# Patient Record
Sex: Male | Born: 1935 | Race: White | Hispanic: No | Marital: Married | Smoking: Former smoker
Health system: Southern US, Community
[De-identification: ages and names within clinical notes are randomized; demographics above are authoritative.]

## PROBLEM LIST (undated history)

## (undated) DIAGNOSIS — E669 Obesity, unspecified: Secondary | ICD-10-CM

## (undated) DIAGNOSIS — I2510 Atherosclerotic heart disease of native coronary artery without angina pectoris: Secondary | ICD-10-CM

## (undated) DIAGNOSIS — I219 Acute myocardial infarction, unspecified: Secondary | ICD-10-CM

## (undated) DIAGNOSIS — H9190 Unspecified hearing loss, unspecified ear: Secondary | ICD-10-CM

## (undated) DIAGNOSIS — I10 Essential (primary) hypertension: Secondary | ICD-10-CM

## (undated) DIAGNOSIS — I639 Cerebral infarction, unspecified: Secondary | ICD-10-CM

## (undated) DIAGNOSIS — K5120 Ulcerative (chronic) proctitis without complications: Secondary | ICD-10-CM

## (undated) DIAGNOSIS — E785 Hyperlipidemia, unspecified: Secondary | ICD-10-CM

## (undated) DIAGNOSIS — G250 Essential tremor: Secondary | ICD-10-CM

## (undated) DIAGNOSIS — I4819 Other persistent atrial fibrillation: Secondary | ICD-10-CM

## (undated) DIAGNOSIS — K219 Gastro-esophageal reflux disease without esophagitis: Secondary | ICD-10-CM

## (undated) DIAGNOSIS — R251 Tremor, unspecified: Secondary | ICD-10-CM

## (undated) DIAGNOSIS — K635 Polyp of colon: Secondary | ICD-10-CM

## (undated) DIAGNOSIS — N400 Enlarged prostate without lower urinary tract symptoms: Secondary | ICD-10-CM

## (undated) DIAGNOSIS — K5790 Diverticulosis of intestine, part unspecified, without perforation or abscess without bleeding: Secondary | ICD-10-CM

## (undated) DIAGNOSIS — K922 Gastrointestinal hemorrhage, unspecified: Secondary | ICD-10-CM

## (undated) DIAGNOSIS — F419 Anxiety disorder, unspecified: Secondary | ICD-10-CM

## (undated) DIAGNOSIS — R413 Other amnesia: Secondary | ICD-10-CM

## (undated) DIAGNOSIS — H269 Unspecified cataract: Secondary | ICD-10-CM

## (undated) DIAGNOSIS — G252 Other specified forms of tremor: Secondary | ICD-10-CM

## (undated) HISTORY — DX: Other persistent atrial fibrillation: I48.19

## (undated) HISTORY — DX: Benign prostatic hyperplasia without lower urinary tract symptoms: N40.0

## (undated) HISTORY — DX: Obesity, unspecified: E66.9

## (undated) HISTORY — DX: Anxiety disorder, unspecified: F41.9

## (undated) HISTORY — DX: Tremor, unspecified: R25.1

## (undated) HISTORY — PX: INGUINAL HERNIA REPAIR: SUR1180

## (undated) HISTORY — DX: Unspecified hearing loss, unspecified ear: H91.90

## (undated) HISTORY — DX: Hyperlipidemia, unspecified: E78.5

## (undated) HISTORY — DX: Acute myocardial infarction, unspecified: I21.9

## (undated) HISTORY — DX: Ulcerative (chronic) proctitis without complications: K51.20

## (undated) HISTORY — DX: Atherosclerotic heart disease of native coronary artery without angina pectoris: I25.10

## (undated) HISTORY — DX: Other specified forms of tremor: G25.2

## (undated) HISTORY — DX: Polyp of colon: K63.5

## (undated) HISTORY — DX: Essential tremor: G25.0

## (undated) HISTORY — PX: CORONARY ANGIOPLASTY WITH STENT PLACEMENT: SHX49

## (undated) HISTORY — DX: Essential (primary) hypertension: I10

## (undated) HISTORY — DX: Unspecified cataract: H26.9

## (undated) HISTORY — DX: Diverticulosis of intestine, part unspecified, without perforation or abscess without bleeding: K57.90

## (undated) HISTORY — DX: Other amnesia: R41.3

## (undated) HISTORY — PX: HAMMER TOE SURGERY: SHX385

## (undated) HISTORY — DX: Gastrointestinal hemorrhage, unspecified: K92.2

## (undated) HISTORY — DX: Gastro-esophageal reflux disease without esophagitis: K21.9

---

## 1999-02-04 ENCOUNTER — Other Ambulatory Visit: Admission: RE | Admit: 1999-02-04 | Discharge: 1999-02-04 | Payer: Self-pay | Admitting: Gastroenterology

## 2000-12-21 ENCOUNTER — Encounter (INDEPENDENT_AMBULATORY_CARE_PROVIDER_SITE_OTHER): Payer: Self-pay | Admitting: Gastroenterology

## 2000-12-22 ENCOUNTER — Other Ambulatory Visit: Admission: RE | Admit: 2000-12-22 | Discharge: 2000-12-22 | Payer: Self-pay | Admitting: Gastroenterology

## 2000-12-22 ENCOUNTER — Encounter (INDEPENDENT_AMBULATORY_CARE_PROVIDER_SITE_OTHER): Payer: Self-pay | Admitting: Gastroenterology

## 2000-12-22 ENCOUNTER — Encounter (INDEPENDENT_AMBULATORY_CARE_PROVIDER_SITE_OTHER): Payer: Self-pay | Admitting: Specialist

## 2003-01-25 ENCOUNTER — Encounter (INDEPENDENT_AMBULATORY_CARE_PROVIDER_SITE_OTHER): Payer: Self-pay | Admitting: Gastroenterology

## 2003-07-29 ENCOUNTER — Inpatient Hospital Stay (HOSPITAL_COMMUNITY): Admission: EM | Admit: 2003-07-29 | Discharge: 2003-08-16 | Payer: Self-pay | Admitting: Cardiology

## 2003-07-29 ENCOUNTER — Encounter: Payer: Self-pay | Admitting: Cardiology

## 2003-07-30 ENCOUNTER — Encounter: Payer: Self-pay | Admitting: Cardiology

## 2003-07-31 ENCOUNTER — Encounter: Payer: Self-pay | Admitting: Cardiology

## 2003-08-01 ENCOUNTER — Encounter: Payer: Self-pay | Admitting: Cardiology

## 2003-08-02 ENCOUNTER — Encounter: Payer: Self-pay | Admitting: Cardiology

## 2003-08-03 ENCOUNTER — Encounter: Payer: Self-pay | Admitting: Cardiology

## 2003-08-05 ENCOUNTER — Encounter: Payer: Self-pay | Admitting: Cardiology

## 2003-08-09 ENCOUNTER — Encounter: Payer: Self-pay | Admitting: Cardiology

## 2003-08-11 ENCOUNTER — Encounter: Payer: Self-pay | Admitting: Cardiology

## 2003-09-10 ENCOUNTER — Ambulatory Visit (HOSPITAL_COMMUNITY): Admission: RE | Admit: 2003-09-10 | Discharge: 2003-09-11 | Payer: Self-pay | Admitting: Cardiology

## 2003-12-20 ENCOUNTER — Inpatient Hospital Stay (HOSPITAL_COMMUNITY): Admission: EM | Admit: 2003-12-20 | Discharge: 2003-12-21 | Payer: Self-pay | Admitting: Emergency Medicine

## 2005-01-16 ENCOUNTER — Ambulatory Visit: Payer: Self-pay | Admitting: Gastroenterology

## 2005-01-31 ENCOUNTER — Ambulatory Visit: Payer: Self-pay | Admitting: Gastroenterology

## 2005-02-05 ENCOUNTER — Ambulatory Visit (HOSPITAL_COMMUNITY): Admission: RE | Admit: 2005-02-05 | Discharge: 2005-02-05 | Payer: Self-pay | Admitting: Gastroenterology

## 2005-02-05 ENCOUNTER — Encounter (INDEPENDENT_AMBULATORY_CARE_PROVIDER_SITE_OTHER): Payer: Self-pay | Admitting: Gastroenterology

## 2005-02-05 ENCOUNTER — Encounter (INDEPENDENT_AMBULATORY_CARE_PROVIDER_SITE_OTHER): Payer: Self-pay | Admitting: *Deleted

## 2005-04-09 ENCOUNTER — Ambulatory Visit: Payer: Self-pay | Admitting: Cardiology

## 2005-08-24 ENCOUNTER — Inpatient Hospital Stay (HOSPITAL_COMMUNITY): Admission: EM | Admit: 2005-08-24 | Discharge: 2005-08-29 | Payer: Self-pay | Admitting: Emergency Medicine

## 2005-08-25 ENCOUNTER — Ambulatory Visit: Payer: Self-pay | Admitting: *Deleted

## 2005-08-27 ENCOUNTER — Encounter: Payer: Self-pay | Admitting: Cardiology

## 2005-10-07 ENCOUNTER — Ambulatory Visit: Payer: Self-pay | Admitting: Cardiology

## 2005-11-04 ENCOUNTER — Ambulatory Visit: Payer: Self-pay | Admitting: Gastroenterology

## 2006-01-27 ENCOUNTER — Ambulatory Visit: Payer: Self-pay | Admitting: Cardiology

## 2006-07-21 ENCOUNTER — Ambulatory Visit: Payer: Self-pay | Admitting: Cardiology

## 2006-07-21 ENCOUNTER — Emergency Department (HOSPITAL_COMMUNITY): Admission: EM | Admit: 2006-07-21 | Discharge: 2006-07-21 | Payer: Self-pay | Admitting: *Deleted

## 2006-07-23 ENCOUNTER — Ambulatory Visit: Payer: Self-pay

## 2006-08-11 ENCOUNTER — Ambulatory Visit: Payer: Self-pay | Admitting: Cardiology

## 2007-01-26 ENCOUNTER — Ambulatory Visit: Payer: Self-pay | Admitting: Cardiology

## 2007-03-16 ENCOUNTER — Ambulatory Visit: Payer: Self-pay | Admitting: Gastroenterology

## 2007-04-19 ENCOUNTER — Ambulatory Visit: Payer: Self-pay | Admitting: Gastroenterology

## 2007-05-25 ENCOUNTER — Ambulatory Visit: Payer: Self-pay | Admitting: Cardiology

## 2007-07-06 ENCOUNTER — Ambulatory Visit: Payer: Self-pay | Admitting: Cardiology

## 2007-07-27 ENCOUNTER — Ambulatory Visit: Payer: Self-pay

## 2007-09-07 ENCOUNTER — Ambulatory Visit: Payer: Self-pay | Admitting: Cardiology

## 2007-12-28 ENCOUNTER — Ambulatory Visit: Payer: Self-pay | Admitting: Gastroenterology

## 2008-01-02 ENCOUNTER — Encounter: Admission: RE | Admit: 2008-01-02 | Discharge: 2008-01-02 | Payer: Self-pay | Admitting: Family Medicine

## 2008-03-09 ENCOUNTER — Ambulatory Visit (HOSPITAL_COMMUNITY): Admission: RE | Admit: 2008-03-09 | Discharge: 2008-03-09 | Payer: Self-pay | Admitting: Family Medicine

## 2008-03-14 ENCOUNTER — Ambulatory Visit: Payer: Self-pay | Admitting: Cardiology

## 2008-08-27 ENCOUNTER — Ambulatory Visit (HOSPITAL_COMMUNITY): Admission: RE | Admit: 2008-08-27 | Discharge: 2008-08-27 | Payer: Self-pay | Admitting: Family Medicine

## 2008-08-29 ENCOUNTER — Ambulatory Visit: Payer: Self-pay | Admitting: Cardiology

## 2008-09-11 ENCOUNTER — Telehealth: Payer: Self-pay | Admitting: Gastroenterology

## 2009-01-01 ENCOUNTER — Encounter: Payer: Self-pay | Admitting: Gastroenterology

## 2009-01-02 ENCOUNTER — Telehealth: Payer: Self-pay | Admitting: Gastroenterology

## 2009-01-02 ENCOUNTER — Ambulatory Visit: Payer: Self-pay | Admitting: Cardiology

## 2009-02-15 ENCOUNTER — Encounter (INDEPENDENT_AMBULATORY_CARE_PROVIDER_SITE_OTHER): Payer: Self-pay | Admitting: *Deleted

## 2009-03-12 ENCOUNTER — Ambulatory Visit: Payer: Self-pay | Admitting: Gastroenterology

## 2009-03-12 DIAGNOSIS — K5150 Left sided colitis without complications: Secondary | ICD-10-CM | POA: Insufficient documentation

## 2009-03-12 DIAGNOSIS — K5190 Ulcerative colitis, unspecified, without complications: Secondary | ICD-10-CM | POA: Insufficient documentation

## 2009-03-12 DIAGNOSIS — K219 Gastro-esophageal reflux disease without esophagitis: Secondary | ICD-10-CM

## 2009-03-20 ENCOUNTER — Ambulatory Visit: Payer: Self-pay | Admitting: Gastroenterology

## 2009-03-20 ENCOUNTER — Encounter: Payer: Self-pay | Admitting: Gastroenterology

## 2009-03-22 ENCOUNTER — Encounter: Payer: Self-pay | Admitting: Gastroenterology

## 2009-03-27 ENCOUNTER — Ambulatory Visit: Payer: Self-pay | Admitting: Cardiology

## 2009-05-21 ENCOUNTER — Telehealth: Payer: Self-pay | Admitting: Gastroenterology

## 2009-07-10 ENCOUNTER — Ambulatory Visit: Payer: Self-pay | Admitting: Cardiology

## 2009-10-30 ENCOUNTER — Ambulatory Visit: Payer: Self-pay | Admitting: Cardiology

## 2009-10-30 DIAGNOSIS — I4819 Other persistent atrial fibrillation: Secondary | ICD-10-CM

## 2009-10-30 DIAGNOSIS — I2510 Atherosclerotic heart disease of native coronary artery without angina pectoris: Secondary | ICD-10-CM | POA: Insufficient documentation

## 2010-02-12 ENCOUNTER — Ambulatory Visit: Payer: Self-pay | Admitting: Cardiology

## 2010-02-12 DIAGNOSIS — E785 Hyperlipidemia, unspecified: Secondary | ICD-10-CM

## 2010-02-28 ENCOUNTER — Telehealth (INDEPENDENT_AMBULATORY_CARE_PROVIDER_SITE_OTHER): Payer: Self-pay | Admitting: *Deleted

## 2010-03-25 ENCOUNTER — Encounter: Payer: Self-pay | Admitting: Cardiology

## 2010-05-28 ENCOUNTER — Ambulatory Visit: Payer: Self-pay | Admitting: Cardiology

## 2010-06-12 ENCOUNTER — Telehealth: Payer: Self-pay | Admitting: Gastroenterology

## 2010-07-30 ENCOUNTER — Ambulatory Visit: Payer: Self-pay | Admitting: Gastroenterology

## 2010-08-19 ENCOUNTER — Encounter: Payer: Self-pay | Admitting: Gastroenterology

## 2010-09-10 ENCOUNTER — Ambulatory Visit: Payer: Self-pay | Admitting: Cardiology

## 2010-12-10 ENCOUNTER — Ambulatory Visit: Admission: RE | Admit: 2010-12-10 | Discharge: 2010-12-10 | Payer: Self-pay | Source: Home / Self Care

## 2010-12-17 ENCOUNTER — Telehealth (INDEPENDENT_AMBULATORY_CARE_PROVIDER_SITE_OTHER): Payer: Self-pay | Admitting: *Deleted

## 2010-12-18 ENCOUNTER — Encounter (HOSPITAL_COMMUNITY)
Admission: RE | Admit: 2010-12-18 | Discharge: 2010-12-23 | Payer: Self-pay | Source: Home / Self Care | Attending: Cardiology | Admitting: Cardiology

## 2010-12-18 ENCOUNTER — Encounter: Payer: Self-pay | Admitting: *Deleted

## 2010-12-18 ENCOUNTER — Ambulatory Visit: Admission: RE | Admit: 2010-12-18 | Discharge: 2010-12-18 | Payer: Self-pay | Source: Home / Self Care

## 2010-12-22 ENCOUNTER — Encounter: Payer: Self-pay | Admitting: Cardiology

## 2010-12-23 NOTE — Assessment & Plan Note (Signed)
Summary: [facility]      Allergies Added:   Visit Type:  Follow-up Referring Provider:  na Primary Provider:  Rudi Heap, MD   CC:  Atrial Fibrillation and CAD.  History of Present Illness: The patient presents for followup of the above. He has done quite well since I last saw him. He doesn't notice any new palpitations and has had no presyncope or syncope. He says the chest discomfort, neck or arm discomfort. He has had no shortness of breath, PND orthopnea. He tries to remain active. Review his lipids and they were excellent. He has been started on diabetes medication.  Current Medications (verified): 1)  Fish Oil 1000 Mg Caps (Omega-3 Fatty Acids) .... One Tablet By Mouth Two Times A Day 2)  Metoprolol Tartrate 100 Mg Tabs (Metoprolol Tartrate) .Marland Kitchen.. 1 1/2 Tablets By Mouth Two Times A Day 3)  Omeprazole 20 Mg Cpdr (Omeprazole) .Marland Kitchen.. 1` By Mouth Daily 4)  Digoxin 0.125 Mg Tabs (Digoxin) .... One Tablet By Mouth Once Daily 5)  Klor-Con M20 20 Meq Cr-Tabs (Potassium Chloride Crys Cr) .... 1/2 By Mouth Daily 6)  Aspirin 81 Mg  Tabs (Aspirin) .... One Tablet By Mouth Once Daily 7)  Lipitor 40 Mg Tabs (Atorvastatin Calcium) .... One Tablet By Mouth Once Daily 8)  Paroxetine Hcl 20 Mg Tabs (Paroxetine Hcl) .Marland Kitchen.. 1 1/2 Tablets By Mouth Once Daily 9)  Niaspan 500 Mg Cr-Tabs (Niacin (Antihyperlipidemic)) .... One Tablet By Mouth At Bedtime 10)  Furosemide 40 Mg Tabs (Furosemide) .Marland Kitchen.. 1 By Mouth Once Daily As Needed For Fluid 11)  Warfarin Sodium 5 Mg Tabs (Warfarin Sodium) .Marland Kitchen.. 1 Tablet By Mouth Once Daily 12)  Nitrostat 0.4 Mg Subl (Nitroglycerin) .... As Needed For Chest Pain 13)  Apriso 0.375 Gm Xr24h-Cap (Mesalamine) .... 4 Tablets By Mouth Every Morning 14)  Calcium Citrate + D 315-200 Mg-Unit Tabs (Calcium Citrate-Vitamin D) .... 2 By Mouth Daily 15)  Vitamin D 1000 Unit Tabs (Cholecalciferol) .Marland Kitchen.. 1 By Mouth Daiy 16)  Rapaflo 8 Mg Caps (Silodosin) .... One Capsule By Mouth Once  Daily 17)  Glipizide-Metformin Hcl 2.5-500 Mg Tabs (Glipizide-Metformin Hcl) .... One Tablet By Mouth in The Morning  Allergies (verified): 1)  ! Sulfa 2)  ! Codeine  Past History:  Past Medical History: Reviewed history from 07/30/2010 and no changes required. Coronary Artery Disease(myocardial infarction with V  fib arrest in 2004).  Left main 25% stenosis, LAD 80% stenosis,   99% stenosis, 25% circumflex stenosis, 25% ramus intermedius stenosis, 50-  40% right coronary artery stenosis, 90% RV branch stenosis with right to  left collaterals.  His EF was 55% with mild anterior hypokinesis (He  underwent Taxus stenting at the time) Atrial Fibrillation Anxiety Disorder Hyperlipidemia BPH Left-sided Ulcerative Colitis Diverticulosis Hyperplastic colon polyps GERD Hemorrhoids Diabetes  Past Surgical History: Reviewed history from 07/30/2010 and no changes required. Bilateral inguinal hernia repair Hammertoe surgery Taxus Stenting   Review of Systems       As stated in the HPI and negative for all other systems.   Vital Signs:  Patient profile:   77 year old male Height:      71 inches Weight:      199 pounds BMI:     27.86 Pulse rate:   92 / minute Resp:     18 per minute BP sitting:   118 / 64  (right arm)  Vitals Entered By: Marrion Coy, CNA (September 10, 2010 9:42 AM)  Physical Exam  General:  Well developed, well nourished, no acute distress. Head:  Normocephalic and atraumatic. Neck:  Supple; no masses or thyromegaly. Chest Wall:  no deformities or breast masses noted Lungs:  rales R base.   Heart:  Irregular rate and rhythm; no murmurs, rubs,  or bruits. Abdomen:  Soft, nontender and nondistended. No masses, hepatosplenomegaly or hernias noted. Normal bowel sounds. Msk:  Back normal, normal gait. Muscle strength and tone normal. Skin:  Intact without lesions or rashes. Cervical Nodes:  no significant adenopathy Psych:  Alert and cooperative. Normal  mood and affect.   EKG  Procedure date:  09/10/2010  Findings:      atrial fibrillation, rate 92, axis within normal limits, intervals within normal limits, poor anterior R-wave progression  Impression & Recommendations:  Problem # 1:  FIBRILLATION, ATRIAL (ICD-427.31) Tthe patient tolerates rate control and anticoagulation. No change in therapy is indicated. Orders: EKG w/ Interpretation (93000)  Problem # 2:  CAD (ICD-414.00) He is having no new symptoms. We will continue with risk reduction. Orders: EKG w/ Interpretation (93000)  Problem # 3:  DYSLIPIDEMIA (ICD-272.4) He is on combination therapy and at target values.  Patient Instructions: 1)  Your physician recommends that you schedule a follow-up appointment in: 11/2010 in [place] 2)  Your physician recommends that you continue on your current medications as directed. Please refer to the Current Medication list given to you today.

## 2010-12-23 NOTE — Assessment & Plan Note (Signed)
Summary: YEARLY MED REFILL/YF   History of Present Illness Visit Type: Follow-up Visit Primary GI MD: Elie Goody MD [facility] Primary Provider: Rudi Heap, MD  Requesting Provider: na Chief Complaint: Yearly f/u for ulcerative colitis. Pt c/o of gas and denies any other GI complaints History of Present Illness:   77 year old male with ulcerative colitis and GERD. He has had no activity of his colitis. Last colonoscopy, 03/2009, was unremarkable, including biopsies. He has chronic GERD, which is well controlled on omeprazole. Chemistry panel performed via Dr. Kathi Der office in May 2011 with normal LFTs, BUN and creatinine.   GI Review of Systems      Denies abdominal pain, acid reflux, belching, bloating, chest pain, dysphagia with liquids, dysphagia with solids, heartburn, loss of appetite, nausea, vomiting, vomiting blood, weight loss, and  weight gain.        Denies anal fissure, black tarry stools, change in bowel habit, constipation, diarrhea, diverticulosis, fecal incontinence, heme positive stool, hemorrhoids, irritable bowel syndrome, jaundice, light color stool, liver problems, rectal bleeding, and  rectal pain. Current Medications (verified): 1)  Fish Oil 1000 Mg Caps (Omega-3 Fatty Acids) .... One Tablet By Mouth Two Times A Day 2)  Metoprolol Tartrate 100 Mg Tabs (Metoprolol Tartrate) .Marland Kitchen.. 1 1/2 Tablets By Mouth Two Times A Day 3)  Omeprazole 20 Mg Cpdr (Omeprazole) .Marland Kitchen.. 1` By Mouth Daily 4)  Digoxin 0.125 Mg Tabs (Digoxin) .... One Tablet By Mouth Once Daily 5)  Klor-Con M20 20 Meq Cr-Tabs (Potassium Chloride Crys Cr) .... 1/2 By Mouth Daily 6)  Aspirin 81 Mg  Tabs (Aspirin) .... One Tablet By Mouth Once Daily 7)  Lipitor 40 Mg Tabs (Atorvastatin Calcium) .... One Tablet By Mouth Once Daily 8)  Paroxetine Hcl 20 Mg Tabs (Paroxetine Hcl) .Marland Kitchen.. 1 1/2 Tablets By Mouth Once Daily 9)  Niaspan 500 Mg Cr-Tabs (Niacin (Antihyperlipidemic)) .... One Tablet By Mouth At Bedtime 10)   Furosemide 40 Mg Tabs (Furosemide) .Marland Kitchen.. 1 By Mouth Once Daily As Needed For Fluid 11)  Warfarin Sodium 5 Mg Tabs (Warfarin Sodium) .Marland Kitchen.. 1 Tablet By Mouth Once Daily 12)  Nitrostat 0.4 Mg Subl (Nitroglycerin) .... As Needed For Chest Pain 13)  Apriso 0.375 Gm Xr24h-Cap (Mesalamine) .... 4 Tablets By Mouth Every Morning 14)  Calcium Citrate + D 315-200 Mg-Unit Tabs (Calcium Citrate-Vitamin D) .... 2 By Mouth Daily 15)  Vitamin D 1000 Unit Tabs (Cholecalciferol) .Marland Kitchen.. 1 By Mouth Daiy 16)  Rapaflo 8 Mg Caps (Silodosin) .... One Capsule By Mouth Once Daily 17)  Glipizide-Metformin Hcl 2.5-500 Mg Tabs (Glipizide-Metformin Hcl) .... One Tablet By Mouth in The Morning  Allergies (verified): 1)  ! Sulfa 2)  ! Codeine  Past History:  Past Medical History: Coronary Artery Disease(myocardial infarction with V  fib arrest in 2004).  Left main 25% stenosis, LAD 80% stenosis,   99% stenosis, 25% circumflex stenosis, 25% ramus intermedius stenosis, 50-  40% right coronary artery stenosis, 90% RV branch stenosis with right to  left collaterals.  His EF was 55% with mild anterior hypokinesis (He  underwent Taxus stenting at the time) Atrial Fibrillation Anxiety Disorder Hyperlipidemia BPH Left-sided Ulcerative Colitis Diverticulosis Hyperplastic colon polyps GERD Hemorrhoids Diabetes  Past Surgical History: Bilateral inguinal hernia repair Hammertoe surgery Taxus Stenting   Family History: Reviewed history from 03/12/2009 and no changes required. No FH of Colon Cancer: Family History of Diabetes: ? mother  Family History of Heart Disease: runs in family  Social History: Reviewed history from 03/12/2009 and  no changes required. Patient is a former smoker.  Years ago Alcohol Use - no Illicit Drug Use - no Occupation: retired  Review of Systems       The patient complains of allergy/sinus, cough, hearing problems, sore throat, and urination changes/pain.         The pertinent  positives and negatives are noted as above and in the HPI. All other ROS were reviewed and were negative.   Vital Signs:  Patient profile:   77 year old male Height:      71 inches Weight:      195 pounds BMI:     27.30 BSA:     2.09 Pulse rate:   88 / minute Pulse rhythm:   regular BP sitting:   118 / 68  (left arm) Cuff size:   regular  Vitals Entered By: Ok Anis CMA (July 30, 2010 2:22 PM)  Physical Exam  General:  Well developed, well nourished, no acute distress. Head:  Normocephalic and atraumatic. Eyes:  PERRLA, no icterus. Ears:  decreased auditory acuity.   Mouth:  No deformity or lesions, dentition normal. Lungs:  rales R base.   Heart:  Regular rate and rhythm; no murmurs, rubs,  or bruits. Abdomen:  Soft, nontender and nondistended. No masses, hepatosplenomegaly or hernias noted. Normal bowel sounds. Psych:  Alert and cooperative. Normal mood and affect.  Impression & Recommendations:  Problem # 1:  ULCERATIVE COLITIS-LEFT SIDE (ICD-556.5) Continue Apriso at current dosage. Next surveillance colonoscopy recommended May 2012.  Problem # 2:  GERD (ICD-530.81) Continue omeprazole 20 mg q.a.m. and antireflux measures.  Problem # 3:  COUMADIN THERAPY (ICD-V58.61)  Problem # 4:  FIBRILLATION, ATRIAL (ICD-427.31)  Patient Instructions: 1)  Pick up your prescription from your pharmacy.  2)  Please continue current medications.  3)  Please schedule a follow-up appointment in 1 year. 4)  Copy sent to : Rudi Heap, MD 5)  The medication list was reviewed and reconciled.  All changed / newly prescribed medications were explained.  A complete medication list was provided to the patient / caregiver.  Prescriptions: APRISO 0.375 GM XR24H-CAP (MESALAMINE) 4 tablets by mouth every morning  #120 x 11   Entered by:   Christie Nottingham CMA (AAMA)   Authorized by:   Meryl Dare MD [facility]   Signed by:   Meryl Dare MD [facility] on 07/30/2010   Method used:    [facility] to        [facility] [phone]* (retail)       [address]       [place], [place]  [address]       Ph: [phone] or [phone]       Fax: [phone]   RxID:   [id number]

## 2010-12-23 NOTE — Assessment & Plan Note (Signed)
Summary: [name]      Allergies Added:   Visit Type:  Follow-up Referring Provider:  n/a Primary Provider:  Riki Sheer  CC:  Atrial Fibrillation.  History of Present Illness: The patient presents for routine followup. Since I last saw him he has had no new complaints. He continues to exercise. He denies any palpitations, presyncope or syncope. He has had no shortness of breath, PND or orthopnea. He denies any chest pressure, neck or arm discomfort.  Current Medications (verified): 1)  Fish Oil 1000 Mg Caps (Omega-3 Fatty Acids) .... Once Daily 2)  Metoprolol Tartrate 100 Mg Tabs (Metoprolol Tartrate) .Marland Kitchen.. 1 1/2 Tablets By Mouth Two Times A Day 3)  Omeprazole 20 Mg Cpdr (Omeprazole) .Marland Kitchen.. 1` By Mouth Daily 4)  Digoxin 0.125 Mg Tabs (Digoxin) .... One Tablet By Mouth Once Daily 5)  Klor-Con M20 20 Meq Cr-Tabs (Potassium Chloride Crys Cr) .... 1/2 By Mouth Daily 6)  Aspirin 81 Mg  Tabs (Aspirin) .... One Tablet By Mouth Once Daily 7)  Lipitor 80 Mg Tabs (Atorvastatin Calcium) .... 1/2 By Mouth Daily 8)  Paroxetine Hcl 20 Mg Tabs (Paroxetine Hcl) .Marland Kitchen.. 1 1/2 Tablets By Mouth Once Daily 9)  Niaspan 500 Mg Cr-Tabs (Niacin (Antihyperlipidemic)) .... One Tablet By Mouth At Bedtime 10)  Furosemide 40 Mg Tabs (Furosemide) .Marland Kitchen.. 1 By Mouth Once Daily 11)  Warfarin Sodium 5 Mg Tabs (Warfarin Sodium) .Marland Kitchen.. 1 Tablet By Mouth Once Daily 12)  Nitrostat 0.4 Mg Subl (Nitroglycerin) .... As Needed For Chest Pain 13)  Apriso 0.375 Gm Xr24h-Cap (Mesalamine) .... 4 Tablets By Mouth Every Morning 14)  Calcium Citrate + D 315-200 Mg-Unit Tabs (Calcium Citrate-Vitamin D) .... 2 By Mouth Daily 15)  Vitamin D 1000 Unit Tabs (Cholecalciferol) .Marland Kitchen.. 1 By Mouth Daiy 16)  Raperflow 8mg  .... 1 By Mouth Daily  Allergies (verified): 1)  ! Sulfa 2)  ! Codeine  Past History:  Past Medical History: Reviewed history from 10/30/2009 and no changes required. Coronary Artery Disease(myocardial infarction  with V  fib arrest in 2004).  Left main 25% stenosis, LAD 80% stenosis,   99% stenosis, 25% circumflex stenosis, 25% ramus intermedius stenosis, 50-  40% right coronary artery stenosis, 90% RV branch stenosis with right to  left collaterals.  His EF was 55% with mild anterior hypokinesis (He  underwent Taxus stenting at the time) Atrial Fibrillation Anxiety Disorder Hyperlipidemia BPH Left-sided ulcerative Colitis Diverticulosis Hyperplastic colon polyps GERD Hemorrhoids  Past Surgical History: Reviewed history from 03/08/2009 and no changes required. Bilateral inguinal hernia repair Hammertoe surgery  Review of Systems       As stated in the HPI and negative for all other systems.   Vital Signs:  Patient profile:   77 year old male Height:      71 inches Weight:      197 pounds BMI:     27.58 Pulse rate:   92 / minute Resp:     18 per minute BP sitting:   108 / 68  (right arm)  Vitals Entered By: Marrion Coy, CNA (May 28, 2010 11:37 AM)  Physical Exam  General:  Well developed, well nourished, in no acute distress. Head:  normocephalic and atraumatic Neck:  Supple; no masses or thyromegaly. Chest Wall:  no deformities or breast masses noted Lungs:  Clear bilaterally to auscultation and percussion. Abdomen:  Bowel sounds positive; abdomen soft and non-tender without masses, organomegaly, or hernias noted. No hepatosplenomegaly. Msk:  Back normal, normal gait. Muscle  strength and tone normal. Extremities:  No clubbing or cyanosis. Neurologic:  Alert and oriented x 3. Skin:  Intact without lesions or rashes. Psych:  Normal affect.   Detailed Cardiovascular Exam  Neck    Carotids: Carotids full and equal bilaterally without bruits.      Neck Veins: Normal, no JVD.    Heart    Inspection: no deformities or lifts noted.      Palpation: normal PMI with no thrills palpable.      Auscultation: irregular rate and rhythm, S1, S2 without murmurs, rubs, gallops, or  clicks.    Vascular    Abdominal Aorta: no palpable masses, pulsations, or audible bruits.      Femoral Pulses: normal femoral pulses bilaterally.      Pedal Pulses: normal pedal pulses bilaterally.      Radial Pulses: normal radial pulses bilaterally.      Peripheral Circulation: no clubbing, cyanosis, or edema noted with normal capillary refill.     EKG  Procedure date:  05/28/2010  Findings:      Atrial fibrillation, rate 88, axis within normal limits, intervals within normal limits, no acute ST-T wave changes, poor anterior R-wave progression, old anteroseptal infarct  Impression & Recommendations:  Problem # 1:  FIBRILLATION, ATRIAL (ICD-427.31) He is tolerating rate control and anticoagulation. No change in therapy is indicated. Orders: EKG w/ Interpretation (93000)  Problem # 2:  CAD (ICD-414.00) He continues to be asymptomatic exercising routinely. He will continue with aggressive risk reduction.  Problem # 3:  DYSLIPIDEMIA (ICD-272.4) Per Dr. Christell Constant with a goal LDL less than 100 and HDL greater than 40.  Patient Instructions: 1)  Your physician recommends that you schedule a follow-up appointment in: 10/11 with Dr Antoine Poche in [place] 2)  Your physician recommends that you continue on your current medications as directed. Please refer to the Current Medication list given to you today. 3)  You have been diagnosed with atrial fibrillation.  Atrial fibrillation is a condition in which one of the upper chambers of the heart has extra electrical cells causing it to beat very fast.  Please see the handout/brochure given to you today for further information.

## 2010-12-23 NOTE — Assessment & Plan Note (Signed)
Summary: [facility]  Medications Added * RAPERFLOW 8MG  1 by mouth daily      Allergies Added: a ball or a  Visit Type:  Follow-up Referring Provider:  n/a Primary Provider:  Riki Sheer  CC:  Atrial Fibrillation.  History of Present Illness: The patient will see to fibrillation and coronary disease. Since I last saw him he has done quite well. He exercises 4 times per week. He is having no chest pain or shortness of breath. He is having no palpitations, presyncope or orthopnea. He denies any weight gain or swelling. He tolerates the meds as listed.  Current Medications (verified): 1)  Fish Oil 1000 Mg Caps (Omega-3 Fatty Acids) .... Once Daily 2)  Metoprolol Tartrate 100 Mg Tabs (Metoprolol Tartrate) .Marland Kitchen.. 1 1/2 Tablets By Mouth Two Times A Day 3)  Omeprazole 20 Mg Cpdr (Omeprazole) .Marland Kitchen.. 1` By Mouth Daily 4)  Digoxin 0.125 Mg Tabs (Digoxin) .... One Tablet By Mouth Once Daily 5)  Klor-Con M20 20 Meq Cr-Tabs (Potassium Chloride Crys Cr) .... 1/2 By Mouth Daily 6)  Aspirin 81 Mg  Tabs (Aspirin) .... One Tablet By Mouth Once Daily 7)  Lipitor 80 Mg Tabs (Atorvastatin Calcium) .... 1/2 By Mouth Daily 8)  Paroxetine Hcl 20 Mg Tabs (Paroxetine Hcl) .Marland Kitchen.. 1 1/2 Tablets By Mouth Once Daily 9)  Niaspan 500 Mg Cr-Tabs (Niacin (Antihyperlipidemic)) .... One Tablet By Mouth At Bedtime 10)  Furosemide 40 Mg Tabs (Furosemide) .Marland Kitchen.. 1 By Mouth Once Daily 11)  Warfarin Sodium 5 Mg Tabs (Warfarin Sodium) .Marland Kitchen.. 1 Tablet By Mouth Once Daily 12)  Nitrostat 0.4 Mg Subl (Nitroglycerin) .... As Needed For Chest Pain 13)  Apriso 0.375 Gm Xr24h-Cap (Mesalamine) .... 4 Tablets By Mouth Every Morning 14)  Calcium Citrate + D 315-200 Mg-Unit Tabs (Calcium Citrate-Vitamin D) .... 2 By Mouth Daily 15)  Vitamin D 1000 Unit Tabs (Cholecalciferol) .Marland Kitchen.. 1 By Mouth Daiy 16)  Raperflow 8mg  .... 1 By Mouth Daily  Allergies (verified): 1)  ! Sulfa 2)  ! Codeine  Past History:  Past Medical  History: Reviewed history from 10/30/2009 and no changes required. Coronary Artery Disease(myocardial infarction with V  fib arrest in 2004).  Left main 25% stenosis, LAD 80% stenosis,   99% stenosis, 25% circumflex stenosis, 25% ramus intermedius stenosis, 50-  40% right coronary artery stenosis, 90% RV branch stenosis with right to  left collaterals.  His EF was 55% with mild anterior hypokinesis (He  underwent Taxus stenting at the time) Atrial Fibrillation Anxiety Disorder Hyperlipidemia BPH Left-sided ulcerative Colitis Diverticulosis Hyperplastic colon polyps GERD Hemorrhoids  Past Surgical History: Reviewed history from 03/08/2009 and no changes required. Bilateral inguinal hernia repair Hammertoe surgery  Review of Systems       As stated in the HPI and negative for all other systems.   Vital Signs:  Patient profile:   77 year old male Height:      71 inches Weight:      200 pounds BMI:     28.00 Pulse rate:   96 / minute Resp:     16 per minute BP sitting:   110 / 70  (right arm)  Vitals Entered By: Marrion Coy, CNA (February 12, 2010 9:30 AM)  Physical Exam  General:  Well developed, well nourished, in no acute distress. Head:  normocephalic and atraumatic Eyes:  PERRLA, no icterus. Mouth:  No deformity or lesions, dentition normal. Neck:  Supple; no masses or thyromegaly. Chest Wall:  no deformities or breast  masses noted Lungs:  Clear bilaterally to auscultation and percussion. Heart:  Iregular rate and rhythm; no murmurs, rubs,  or bruits. Abdomen:  Bowel sounds positive; abdomen soft and non-tender without masses, organomegaly, or hernias noted. No hepatosplenomegaly. Msk:  Back normal, normal gait. Muscle strength and tone normal. Pulses:  normal pedal pulses bilaterally.   Extremities:  No clubbing or cyanosis. Neurologic:  Alert and oriented x 3. Skin:  Intact without lesions or rashes. Cervical Nodes:  no significant adenopathy Axillary Nodes:   no significant adenopathy Inguinal Nodes:  no significant adenopathy Psych:  Normal affect.   Impression & Recommendations:  Problem # 1:  FIBRILLATION, ATRIAL (ICD-427.31) No further cardiovascular testing is suggested. He tolerates anticoagulation and rate control.  Orders: EKG w/ Interpretation (93000)  Problem # 2:  CAD (ICD-414.00) He has no new symptoms. No further testing is suggested. He will continue with risk reduction. Orders: EKG w/ Interpretation (93000)  Problem # 3:  DYSLIPIDEMIA (ICD-272.4) I reviewed his lipid profile done by Dr. Christell Constant in January. It is excellent and he will continue the meds as listed.  Patient Instructions: 1)  Your physician recommends that you schedule a follow-up appointment in: 3 months in [place] with Dr Antoine Poche 04/2010 2)  Your physician recommends that you continue on your current medications as directed. Please refer to the Current Medication list given to you today. 3)  You have been diagnosed with atrial fibrillation.  Atrial fibrillation is a condition in which one of the upper chambers of the heart has extra electrical cells causing it to beat very fast.  Please see the handout/brochure given to you today for further information.

## 2010-12-23 NOTE — Progress Notes (Signed)
Summary: Apriso refill  Medications Added APRISO 0.375 GM XR24H-CAP (MESALAMINE) 4 tablets by mouth every morning       Phone Note Call from Patient   Caller: Spouse Call For: Dr Russella Dar Summary of Call: Scheduled next available appt for 08-06-10. Is all out of Apriso and would rally like a refill to hold him over until appt please. Initial call taken by: Leanor Kail [facility],  June 12, 2010 11:16 AM  Follow-up for Phone Call        Rx was sent to pts pharmacy and pt notified to keep appt for any further refills.  Follow-up by: Christie Nottingham CMA [facility]),  June 12, 2010 11:24 AM    New/Updated Medications: APRISO 0.375 GM XR24H-CAP (MESALAMINE) 4 tablets by mouth every morning Prescriptions: APRISO 0.375 GM XR24H-CAP (MESALAMINE) 4 tablets by mouth every morning  #120 x 1   Entered by:   Christie Nottingham CMA (AAMA)   Authorized by:   Meryl Dare MD [facility]   Signed by:   Christie Nottingham CMA [facility]) on 06/12/2010   Method used:   [facility]        [facility] [phone]* (retail)       [address]       [place], [place]  [address]       Ph: [phone] or [phone]       Fax: [phone]   RxID:   [id number]

## 2010-12-23 NOTE — Progress Notes (Signed)
   Faxed LOV over to [facility] to fax [phone] [facility]  February 28, 2010 1:05 PM

## 2010-12-25 NOTE — Assessment & Plan Note (Addendum)
Summary: Cardiology Nuclear Testing  Nuclear Med Background Indications for Stress Test: Evaluation for Ischemia, Stent Patency   History: Cardioversion, Heart Catheterization, Myocardial Infarction, Myocardial Perfusion Study, Stents  History Comments: '04 OOH MI>Stents-RCA/LAD; '07 PIR:JJOAC  antero-apical scar, no ischemia, EF=57%; h/o afib, CHF  Symptoms: DOE, SOB    Nuclear Pre-Procedure Cardiac Risk Factors: Family History - CAD, Hypertension, Lipids, NIDDM Caffeine/Decaff Intake: None NPO After: 8:00 PM Lungs: clear IV 0.9% NS with Angio Cath: 22g     IV Site: R Hand IV Started by: Bonnita Levan, RN Chest Size (in) 48     Height (in): 71 Weight (lb): 200 BMI: 28.00  Nuclear Med Study 1 or 2 day study:  1 day     Stress Test Type:  Treadmill/Lexiscan Reading MD:  Cassell Clement, MD     Referring MD:  J.Hochrein Resting Radionuclide:  Technetium 57m Tetrofosmin     Resting Radionuclide Dose:  11.0 mCi  Stress Radionuclide:  Technetium 31m Tetrofosmin     Stress Radionuclide Dose:  33.0 mCi   Stress Protocol  Max Systolic BP: 130 mm Hg Lexiscan: 0.4 mg   Stress Test Technologist:  Milana Na, EMT-P     Nuclear Technologist:  Harlow Asa, CNMT  Rest Procedure  Myocardial perfusion imaging was performed at rest 45 minutes following the intravenous administration of Technetium 10m Tetrofosmin.  Stress Procedure  The patient received IV Lexiscan 0.4 mg over 15-seconds with concurrent low level exercise and then Technetium 57m Tetrofosmin was injected at 30-seconds while the patient continued walking one more minute.  There were no significant changes with Lexiscan.  Quantitative spect images were obtained after a 45 minute delay.  QPS Raw Data Images:  Normal; no motion artifact; normal heart/lung ratio. Stress Images:  There is decreased uptake in the apical wall. Rest Images:  There is decreased uptake in the apical wall. Subtraction (SDS):  There is a  fixed defect that is most consistent with a previous infarction. Transient Ischemic Dilatation:  1.04  (Normal <1.22)  Lung/Heart Ratio:  0.27  (Normal <0.45)  Quantitative Gated Spect Images QGS EDV:  105 ml QGS ESV:  55 ml QGS EF:  48 %  Findings Low risk nuclear study  Evidence for anterior (septal apical) infarct  Evidence for LV Dysfunction LV Dysfunction    Overall Impression  Exercise Capacity: Lexiscan with low level exercise. BP Response: Normal blood pressure response. Clinical Symptoms: No chest pain ECG Impression: No significant ST segment change suggestive of ischemia. Overall Impression: Low risk stress nuclear study. 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  EF 48% with apical hypokinesis.  Appended Document: Cardiology Nuclear Testing No significant abnormalities.  Continue current therapy.  Appended Document: Cardiology Nuclear Testing left of results on voicemail

## 2010-12-25 NOTE — Progress Notes (Signed)
Summary: Nuclear pre procedure  Phone Note Outgoing Call Call back at [facility] Phone [phone]   Call placed by: Rea College, CMA,  December 17, 2010 2:40 PM Call placed to: Patient Summary of Call: Reviewed information on Myoview Information Sheet (see scanned document for further details).  Spoke with patient.      Nuclear Med Background Indications for Stress Test: Evaluation for Ischemia, Stent Patency   History: Cardioversion, Heart Catheterization, Myocardial Infarction, Myocardial Perfusion Study, Stents  History Comments: '04 OOH MI>Stents-RCA/LAD; '07 GEX:BMWUX  antero-apical scar, no ischemia, EF=57%; h/o afib, CHF     Nuclear Pre-Procedure Cardiac Risk Factors: Family History - CAD, Hypertension, Lipids, NIDDM Height (in): 71

## 2010-12-25 NOTE — Assessment & Plan Note (Signed)
Summary: [facility]      Allergies Added:   Visit Type:  Follow-up Referring Provider:  na Primary Provider:  Rudi Heap, MD   CC:  CAD.  History of Present Illness: The patient presents for followup of his known coronary disease. Since I last saw him he has had no new cardiovascular complaints.  He does his routine exercises and denies any chest pain. He has had no new SOB, PND or orthopnea.  He has had no new palpiations, presyncope or syncope.  He denies any weight gain or edema.  I reivewed lipids drawn on the 12th of this month and they were excellent.  Current Medications (verified): 1)  Fish Oil 1000 Mg Caps (Omega-3 Fatty Acids) .... One Tablet By Mouth Two Times A Day 2)  Metoprolol Tartrate 100 Mg Tabs (Metoprolol Tartrate) .Marland Kitchen.. 1 1/2 Tablets By Mouth Two Times A Day 3)  Omeprazole 20 Mg Cpdr (Omeprazole) .Marland Kitchen.. 1` By Mouth Daily 4)  Digoxin 0.125 Mg Tabs (Digoxin) .... One Tablet By Mouth Once Daily 5)  Klor-Con M20 20 Meq Cr-Tabs (Potassium Chloride Crys Cr) .... 1/2 By Mouth Daily 6)  Aspirin 81 Mg  Tabs (Aspirin) .... One Tablet By Mouth Once Daily 7)  Lipitor 40 Mg Tabs (Atorvastatin Calcium) .... One Tablet By Mouth Once Daily 8)  Paroxetine Hcl 20 Mg Tabs (Paroxetine Hcl) .Marland Kitchen.. 1 1/2 Tablets By Mouth Once Daily 9)  Niaspan 500 Mg Cr-Tabs (Niacin (Antihyperlipidemic)) .... One Tablet By Mouth At Bedtime 10)  Furosemide 40 Mg Tabs (Furosemide) .Marland Kitchen.. 1 By Mouth Once Daily As Needed For Fluid 11)  Warfarin Sodium 5 Mg Tabs (Warfarin Sodium) .Marland Kitchen.. 1 Tablet By Mouth Once Daily 12)  Nitrostat 0.4 Mg Subl (Nitroglycerin) .... As Needed For Chest Pain 13)  Apriso 0.375 Gm Xr24h-Cap (Mesalamine) .... 4 Tablets By Mouth Every Morning 14)  Calcium Citrate + D 315-200 Mg-Unit Tabs (Calcium Citrate-Vitamin D) .... 2 By Mouth Daily 15)  Vitamin D 1000 Unit Tabs (Cholecalciferol) .Marland Kitchen.. 1 By Mouth Daiy 16)  Rapaflo 8 Mg Caps (Silodosin) .... One Capsule By Mouth Once Daily 17)   Glipizide-Metformin Hcl 2.5-500 Mg Tabs (Glipizide-Metformin Hcl) .... One Tablet By Mouth in The Morning  Allergies (verified): 1)  ! Sulfa 2)  ! Codeine  Past History:  Past Medical History: Reviewed history from 07/30/2010 and no changes required. Coronary Artery Disease(myocardial infarction with V  fib arrest in 2004).  Left main 25% stenosis, LAD 80% stenosis,   99% stenosis, 25% circumflex stenosis, 25% ramus intermedius stenosis, 50-  40% right coronary artery stenosis, 90% RV branch stenosis with right to  left collaterals.  His EF was 55% with mild anterior hypokinesis (He  underwent Taxus stenting at the time) Atrial Fibrillation Anxiety Disorder Hyperlipidemia BPH Left-sided Ulcerative Colitis Diverticulosis Hyperplastic colon polyps GERD Hemorrhoids Diabetes  Past Surgical History: Reviewed history from 07/30/2010 and no changes required. Bilateral inguinal hernia repair Hammertoe surgery Taxus Stenting   Review of Systems       As stated in the HPI and negative for all other systems.   Vital Signs:  Patient profile:   77 year old male Height:      71 inches Weight:      202 pounds BMI:     28.28 Pulse rate:   92 / minute Resp:     16 per minute BP sitting:   118 / 72  (right arm)  Vitals Entered By: Marrion Coy, CNA (December 10, 2010 11:57 AM)  Physical Exam  General:  Well developed, well nourished, no acute distress. Head:  Normocephalic and atraumatic. Eyes:  PERRLA, no icterus. Mouth:  No deformity or lesions, dentition normal. Neck:  Supple; no masses or thyromegaly. Chest Wall:  no deformities or breast masses noted Lungs:  rales R base.   Heart:  Irregular rate and rhythm; no murmurs, rubs,  or bruits. Abdomen:  Soft, nontender and nondistended. No masses, hepatosplenomegaly or hernias noted. Normal bowel sounds. Msk:  Back normal, normal gait. Muscle strength and tone normal. Extremities:  No clubbing or cyanosis. Neurologic:   Alert and oriented x 3. Skin:  Intact without lesions or rashes. Cervical Nodes:  no significant adenopathy Inguinal Nodes:  no significant adenopathy Psych:  Alert and cooperative. Normal mood and affect.   Impression & Recommendations:  Problem # 1:  CAD (ICD-414.00) It has been 7 years since his last stress test.  Therefore stress perfusion testing is indicated.  He will otherwise continue with current management.  Problem # 2:  DYSLIPIDEMIA (ICD-272.4) He has an excellent lipid profile and he will continue the meds as listed.  Problem # 3:  FIBRILLATION, ATRIAL (ICD-427.31) He tolerates rate control and anticoagulation. Orders: Nuclear Stress Test (Nuc Stress Test)  Patient Instructions: 1)  Your physician recommends that you schedule a follow-up appointment in: 3 months with Dr Antoine Poche  in [place] 2)  Your physician recommends that you continue on your current medications as directed. Please refer to the Current Medication list given to you today. 3)  Your physician has requested that you have an adenosine myoview.  For further information please visit [email or web address]  Please follow instruction sheet, as given.

## 2011-02-11 DIAGNOSIS — I491 Atrial premature depolarization: Secondary | ICD-10-CM | POA: Insufficient documentation

## 2011-02-11 DIAGNOSIS — F410 Panic disorder [episodic paroxysmal anxiety] without agoraphobia: Secondary | ICD-10-CM | POA: Insufficient documentation

## 2011-02-11 DIAGNOSIS — N400 Enlarged prostate without lower urinary tract symptoms: Secondary | ICD-10-CM | POA: Insufficient documentation

## 2011-03-25 ENCOUNTER — Encounter: Payer: Self-pay | Admitting: Cardiology

## 2011-03-25 ENCOUNTER — Ambulatory Visit (INDEPENDENT_AMBULATORY_CARE_PROVIDER_SITE_OTHER): Payer: Medicare Other | Admitting: Cardiology

## 2011-03-25 DIAGNOSIS — I4891 Unspecified atrial fibrillation: Secondary | ICD-10-CM

## 2011-03-25 DIAGNOSIS — I2510 Atherosclerotic heart disease of native coronary artery without angina pectoris: Secondary | ICD-10-CM

## 2011-03-25 DIAGNOSIS — E785 Hyperlipidemia, unspecified: Secondary | ICD-10-CM

## 2011-03-25 DIAGNOSIS — I480 Paroxysmal atrial fibrillation: Secondary | ICD-10-CM

## 2011-03-25 NOTE — Assessment & Plan Note (Addendum)
He has an excellent lipid profile and I will continue with the therapy listed.

## 2011-03-25 NOTE — Assessment & Plan Note (Signed)
He tolerates this rhythm with rate control and anticoagulation. No change in therapy is indicated.

## 2011-03-25 NOTE — Patient Instructions (Signed)
Continue current medications Follow up in 3 months with Dr Antoine Poche in [place]

## 2011-03-25 NOTE — Progress Notes (Signed)
HPI The patient returns for 4 month followup. Since I last saw him he had a stress perfusion imaging demonstrating an old anteroseptal infarct with an EF of 40%. He continues to do well clinically. He has had no chest discomfort, neck or arm discomfort. He has had no palpitations, presyncope or syncope. He is in atrial fibrillation but does not seem to notice this. He has had no weight gain or edema. He denies any new shortness of breath, PND or orthopnea.  Allergies  Allergen Reactions  . Asa-Apap-Salicyl-Caff     Takes warfarin  . Codeine   . Nsaids Other (See Comments)    On warfarin  . Sulfonamide Derivatives     Current Outpatient Prescriptions  Medication Sig Dispense Refill  . atorvastatin (LIPITOR) 40 MG tablet Take 40 mg by mouth at bedtime.        . calcium citrate-vitamin D (CITRACAL+D) 315-200 MG-UNIT per tablet Take 1 tablet by mouth.        . desogestrel-ethinyl estradiol (APRI) 0.15-30 MG-MCG per tablet Take 1 tablet by mouth. 4 tabs in am       . digoxin (LANOXIN) 0.125 MG tablet Take 125 mcg by mouth daily.        . fish oil-omega-3 fatty acids 1000 MG capsule Take 2 g by mouth daily.        . furosemide (LASIX) 40 MG tablet Take 40 mg by mouth daily. If needed for fld       . glipiZIDE-metformin (METAGLIP) 2.5-500 MG per tablet Take 1 tablet by mouth daily.        . metoprolol (LOPRESSOR) 100 MG tablet Take 100 mg by mouth. One and one half qd       . niacin (NIASPAN) 500 MG CR tablet Take 500 mg by mouth at bedtime.        . nitroGLYCERIN (NITROSTAT) 0.3 MG SL tablet Place 0.3 mg under the tongue every 5 (five) minutes as needed. Never had to use       . omeprazole (PRILOSEC) 20 MG capsule Take 20 mg by mouth daily.        Marland Kitchen PARoxetine (PAXIL) 20 MG tablet Take 20 mg by mouth. One tab to one half  qd       . potassium chloride SA (K-DUR,KLOR-CON) 20 MEQ tablet 20 mEq. One half qd      . warfarin (COUMADIN) 5 MG tablet Take 5 mg by mouth daily.          Past Medical  History  Diagnosis Date  . CAD (coronary artery disease)     Anterior MI and DES stenting 2004, Vfib arrest  . PAF (paroxysmal atrial fibrillation)   . Dyslipidemia   . Anxiety   . GIB (gastrointestinal bleeding)   . Obesity   . UC (ulcerative colitis confined to rectum)     Past Surgical History  Procedure Date  . Hammer toe surgery   . Inguinal hernia repair     ROS: As stated in the HPI and negative for all other systems.  PHYSICAL EXAM BP 119/70  Pulse 91  Ht 5\' 11"  (1.803 m)  Wt 202 lb (91.627 kg)  BMI 28.17 kg/m2 GENERAL:  Well appearing HEENT:  Pupils equal round and reactive, fundi not visualized, oral mucosa unremarkable NECK:  No jugular venous distention, waveform within normal limits, carotid upstroke brisk and symmetric, no bruits, no thyromegaly LYMPHATICS:  No cervical, inguinal adenopathy LUNGS:  Clear to auscultation bilaterally BACK:  No CVA  tenderness CHEST:  Unremarkable HEART:  PMI not displaced or sustained,S1 and S2 within normal limits, no S3, no clicks, no rubs, no murmurs, irregular ABD:  Flat, positive bowel sounds normal in frequency in pitch, no bruits, no rebound, no guarding, no midline pulsatile mass, no hepatomegaly, no splenomegaly EXT:  2 plus pulses throughout, no edema, no cyanosis no clubbing SKIN:  No rashes no nodules NEURO:  Cranial nerves II through XII grossly intact, motor grossly intact throughout PSYCH:  Cognitively intact, oriented to person place and time  EKG:  Atrial fibrillation, no acute ST T wave changes.  ASSESSMENT AND PLAN

## 2011-03-25 NOTE — Assessment & Plan Note (Signed)
He had a stress test earlier this year with no active ischemia. No change in therapy is indicated.

## 2011-04-07 NOTE — Assessment & Plan Note (Signed)
[facility]                            CARDIOLOGY OFFICE NOTE   Marquavion, Venhuizen DOSS CYBULSKI                        MRN:          [id number]  DATE:01/02/2009                            DOB:          [birth year]    REASON FOR PRESENTATION:  Evaluate the patient with atrial fibrillation  and coronary artery disease.   HISTORY OF PRESENT ILLNESS:  The patient returns for 61-month followup.  He has had no new problems since I last saw him.  He is now 77 years  old.  He is getting along well, exercising 5 days a week, walking on a  treadmill.  With this level of activity, he denies any chest discomfort,  neck or arm discomfort.  He has had no shortness of breath, PND, or  orthopnea.  He has had no new palpitations, presyncope, or syncope.   PAST MEDICAL HISTORY:  1. Coronary artery disease (see the August 02, 2006, note for      details.  Well-preserved ejection fraction with an EF of 55% with      mild anterior hypokinesis).  2. Paroxysmal atrial fibrillation.  3. Dyslipidemia.  4. Anxiety.  5. Benign prostatic hypertrophy.  6. Ulcerative colitis.  7. Colonic polyps.  8. Previous GI bleed.  9. Bilateral inguinal hernia repair and Hammer toe surgery.   ALLERGIES:  SULFA and CODEINE.   MEDICATIONS:  1. Metoprolol 150 mg b.i.d.  2. Fish oil.  3. Calcium.  4. Furosemide 40 mg daily.  5. Pantoprazole 40 mg daily.  6. Digoxin 0.125 mcg daily.  7. Klor-Con 20 mEq daily.  8. Aspirin 81 mg daily.  9. Lipitor 80 mg at bedtime.  10.Coumadin 5 mg as directed.  11.Paroxetine 30 mg daily.  12.Niaspan 500 mg at bedtime.  13.Vitamin D.  14.Asacol 1200 mg t.i.d.   REVIEW OF SYSTEMS:  As stated in the HPI, and otherwise negative for  other systems.   PHYSICAL EXAMINATION:  GENERAL:  The patient is in no distress.  VITAL SIGNS:  Blood pressure 122/70, heart rate 95 and regular, weight  214 pounds, and body mass index 29.  HEENT:  Eyelids unremarkable; pupils  equal, round, and reactive to  light, fundi not visualized.  NECK:  No jugular venous distention at 45 degrees; carotid upstroke  brisk and symmetric; no bruits, no thyromegaly.  LYMPHATICS:  No adenopathy.  LUNGS:  Clear to auscultation bilaterally.  BACK:  No costovertebral angle tenderness.  CHEST:  Unremarkable.  HEART:  PMI not displaced or sustained; S1 and S2 within normal limits;  no S3, no S4; no clicks, no rubs, no murmurs.  ABDOMEN:  Obese; positive bowel sounds; normal in frequency and pitch;  no bruits, no rebound, no guarding; no midline pulsatile mass; no  organomegaly.  SKIN:  No rashes.  No nodules.  EXTREMITIES:  Pulses 2+, no edema.   ASSESSMENT AND PLAN:  1. Atrial fibrillation.  The patient's rate is reasonable.  I have put      a monitor on him before.  He has a slight tachybrady, but overall  the rate is controlled.  He will continue on the meds as listed.      He tolerates Coumadin.  2. Coronary artery disease.  He is having no symptoms.  No further      cardiovascular testing is suggested.  3. Dyslipidemia.  I reviewed his lipid profile and he has an excellent      ratio.  His LDL is 62 and his HDL is 39.  He will continue on the      meds as listed.  4. Hypertension.  His blood pressure is controlled.  He will continue      the meds as listed.  5. Followup.  He would like to be seen every 3-4 months and I will      arrange this.     [name], MD, [facility]  Electronically Signed    JH/MedQ  DD: 01/02/2009  DT: 01/02/2009  Job #: [id number]   cc:   [name], M.D.

## 2011-04-07 NOTE — Assessment & Plan Note (Signed)
[name]                         GASTROENTEROLOGY OFFICE NOTE   [name], [name]                          MRN:          [id number]  DATE:12/28/2007                            DOB:          05/24/1930    [name] is a 76 year old white male, previously followed by Dr. Victorino [name] with a history of left-sided ulcerative colitis, diagnosed  initially in the early 1990s.  In addition, he has had a history of  hyperplastic colon polyps.  His last colonoscopy was performed in May of  2008, and showed mild left-sided colitis, hemorrhoids and  diverticulosis.  He states he has done very well with only a rare  episode of small amounts of rectal bleeding, which may be hemorrhoidal.  He has no abdominal pain, diarrhea, change in bowel habits, fevers,  chills or rashes.  He has chronic GERD and his reflux symptoms are well-  controlled on pantoprazole.  He relates no dysphagia or odynophagia.  Surveillance colonoscopies with biopsies have not shown dysplasia.   PAST MEDICAL HISTORY:  Coronary artery disease  paroxysmal atrial fibrillation  chronic Coumadin therapy  Left-sided ulcerative colitis  diverticulosis  hemorrhoids  GERD  Dyslipidemia  Anxiety  Benign prostatic hypertrophy  Hyperplastic colon polyps  Status post bilateral inguinal hernia repairs  Status post hammertoe surgery.   CURRENT MEDICATIONS:  Listed on the chart, updated and reviewed.   MEDICATION ALLERGIES:  CODEINE and SULFA DRUGS.   PHYSICAL EXAM:  Well-developed, well-nourished, white male, who is  present with his wife.  Weight 213.8 pounds, blood pressure is 118/66, pulse 88 and regular.  CHEST:  Clear to auscultation bilaterally.  CARDIAC:  Regular rate and rhythm without murmurs appreciated.  ABDOMEN:  Soft, nontender, nondistended.  Normoactive bowel sounds.  No  palpable organomegaly, masses or hernias.  NEUROLOGIC:  Alert and oriented times three.  Some memory  deficits.  Otherwise grossly nonfocal.   ASSESSMENT AND PLAN:  1. Longstanding left-sided ulcerative colitis.  Renew Asacol at 1.6 g      t.i.d.  When his symptoms are under very good control, he may      reduce the dosage to 1.2 g t.i.d and increase to 1.6g TID for      flares of bloody diarrhea.  Plan for surveillance colonoscopy in      May of 2010.  2. Diverticulosis.  Long-term high-fiber diet.  3. Internal and external hemorrhoids.  Expectant management.  Over-the-      counter hemorrhoidal creams and suppositories as needed for minor      rectal bleeding.  4. GERD.  Maintain pantoprazole 40 mg p.o. q.a.m.  Plan for an upper      endoscopy at the time of his next colonoscopy for Barrett's      screening.     [name]. [name], [name], [facility]  Electronically Signed    MTS/MedQ  DD: 12/28/2007  DT: 12/28/2007  Job #: [id number]   cc:   [name], M.D.

## 2011-04-07 NOTE — Assessment & Plan Note (Signed)
[facility]                            CARDIOLOGY OFFICE NOTE   [name], [name]                        MRN:          [id number]  DATE:07/10/2009                            DOB:          1930-04-24    PRIMARY CARE PHYSICIAN:  [name], MD   REASON FOR PRESENTATION:  Evaluate the patient with atrial fibrillation  and coronary disease.   HISTORY OF PRESENT ILLNESS:  The patient presents for followup of the  above.  Since I last saw him, he has been doing well.  He has had no new  cardiovascular complaints.  He did break some ribs falling out of bed.  He has been limited in his activities because of this.  However, the  pain is slowly resolving.  He has not had palpitations, presyncope, or  syncope.  He has otherwise not had any chest discomfort or shortness of  breath.   PAST MEDICAL HISTORY:  1. Coronary artery disease (see the August 02, 2006 note for      details).  His last perfusion study in 2007 demonstrated an EF 55%      with mild anterior hypokinesis).  2. Atrial fibrillation.  3. Dyslipidemia.  4. Anxiety.  5. Benign prostatic pressure.  6. Ulcerative colitis.  7. Chronic polyps.  8. Previous GI bleeding.  9. Bilateral inguinal hernia repair.  10.Hammertoe surgery.   ALLERGIES:  SULFA and CODEINE.   MEDICATIONS:  1. Metoprolol 150 mg b.i.d.  2. Fish oil.  3. Calcium.  4. Pantoprazole 40 mg daily.  5. Digoxin 125 mcg daily.  6. Klor-Con 20 mg daily.  7. Aspirin 81 mg daily.  8. Lipitor 80 mg daily.  9. Coumadin.  10.Paroxetine.  11.Niaspan 500 mg at bedtime.  12.Vitamin D.  13.Apriso.   REVIEW OF SYSTEMS:  As stated in the HPI and otherwise negative for  other systems.   PHYSICAL EXAMINATION:  GENERAL:  The patient is pleasant in no distress.  VITAL SIGNS:  Blood pressure 108/64, heart rate 89 and irregular, weight  205 pounds, body mass index 27.  NECK:  No jugular venous distention at 45 degrees.  Carotid  upstroke  brisk and symmetric.  No bruits.  LUNGS:  Clear to auscultation bilaterally.  HEART:  PMI not displaced or sustained, S1 and S2 within normal limits,  no S3, no S4, no clicks, no rubs, no murmurs.  ABDOMEN:  Mildly obese, positive bowel sounds normal in frequency and  pitch, no bruits, no rebound, no guarding, no midline pulsatile mass, no  organomegaly.  SKIN:  No rashes, no nodules.  EXTREMITIES:  Pulses 2+, no edema.   ASSESSMENT AND PLAN:  1. Atrial fibrillation.  The patient has good rate control.  He      tolerates anticoagulation.  No further cardiovascular testing is      suggested.  2. Coronary disease, having no new symptoms.  He had his last stress      test in 2007.  He will continue with risk reduction per Dr. Christell Constant.  3. Followup.  The patient insists on q.3  months followup, and I will      oblige him of this.     [name], MD, [facility]  Electronically Signed    JH/MedQ  DD: 07/10/2009  DT: 07/10/2009  Job #: [id number]   cc:   [name], M.D.

## 2011-04-07 NOTE — Assessment & Plan Note (Signed)
[facility] HEALTHCARE                            CARDIOLOGY OFFICE NOTE   [name], [name]                        MRN:          [id number]  DATE:03/27/2009                            DOB:          Mar 29, 1930    PRIMARY CARE PHYSICIAN:  [name], MD   REASON FOR PRESENTATION:  Evaluate the patient with atrial fibrillation  and coronary artery disease.   HISTORY OF PRESENT ILLNESS:  The patient presents with 53-month followup.  He insists on this.  He is having no new cardiovascular problems.  He  denies any palpitations, presyncope, or syncope.  He has had no chest  discomfort or shortness of breath.  He is exercising with physical  therapy in this building several days a week.   PAST MEDICAL HISTORY:  1. Coronary artery disease (see the August 02, 2006, note for      details.  Well-preserved ejection fraction with an EF of 55%.  Mild      anterior hypokinesis).  2. Atrial fibrillation.  3. Dyslipidemia.  4. Anxiety.  5. Benign prostatic hypertrophy.  6. Ulcerative colitis.  7. Chronic polyps.  8. Previous GI bleed.  9. Bilateral inguinal hernia repair.  10.Hammertoe surgery.   ALLERGIES:  SULFA and CODEINE.   MEDICATIONS:  1. Metoprolol 150 mg b.i.d.  2. Fish oil.  3. Calcium.  4. Furosemide 40 mg daily.  5. Pantoprazole 40 mg daily.  6. Digoxin 0.125 mg daily.  7. Potassium 20 mEq daily.  8. Aspirin 81 mg daily.  9. Lipitor 80 mg daily.  10.Coumadin as directed.  11.Paroxetine 30 mg daily.  12.Niaspan 500 mg nightly.  13.Vitamin D.  14.Asacol.   REVIEW OF SYSTEMS:  As stated in the HPI; otherwise, negative for all  other systems.   PHYSICAL EXAMINATION:  GENERAL:  The patient is in no distress.  VITAL SIGNS:  Blood pressure 110/78, heart rate 80 and irregular, weight  210 pounds, and body mass index 228.  HEENT:  Eyelids are unremarkable.  Pupils equal, round, and reactive to  light.  Fundi not visualized.  NECK:  No  jugular venous distention at 45 degrees.  Carotid upstroke  brisk and symmetric.  No bruits.  No thyromegaly.  LUNGS:  Clear to auscultation bilaterally.  HEART:  PMI not displaced or sustained.  S1 and S2 within normal limits.  No S3.  No clicks, no rubs, no murmurs.  ABDOMEN:  Obese, positive bowel sounds, normal in frequency and pitch.  No bruits, no rebound, no guarding.  No midline pulsatile mass.  No  organomegaly.  SKIN:  No rashes, no nodules.  EXTREMITIES:  Pulses 2+.  No edema.   ASSESSMENT AND PLAN:  1. Atrial fibrillation.  The patient is reasonably rate controlled.      He tolerates Coumadin.  No further cardiovascular testing is      suggested.  2. Coronary artery disease.  He is having no new symptoms.  No further      cardiovascular testing is suggested.  3. Dyslipidemia.  He has had a good lipid profile.  He  will continue      per Dr. Christell Constant.  4. Hypertension.  Blood pressure is controlled and he will continue      the meds as listed.  5. Obesity.  He understands the need to lose weight with diet and      exercise.  6. Followup.  He insists on 29-month followup and I will facilitate      this.     [name], MD, [facility]  Electronically Signed    JH/MedQ  DD: 03/27/2009  DT: 03/28/2009  Job #: [id number]   cc:   [name], M.D.

## 2011-04-07 NOTE — Assessment & Plan Note (Signed)
[facility]ALTHCARE                            CARDIOLOGY OFFICE NOTE   [name], [name]                        MRN:          [id number]  DATE:07/06/2007                            DOB:          1930/04/07    PRIMARY:  Alfredia Client, M.D.   REASON FOR PRESENTATION:  Evaluate the patient for atrial fibrillation.   HISTORY OF PRESENT ILLNESS:  The patient presented with dyspnea at the  last visit in early July.  He has had paroxysmal atrial fibrillation but  was in persistent atrial fibrillation with a rapid ventricular rate.  Increased his beta blocker at that point.  He also has been started on  digoxin since then.  He says he feels much better.  He has not been  having any further shortness of breath.  He denies any PND or orthopnea.  He is not noticing any palpitations.  He has had no pre-syncope or  syncope.  He denies any chest discomfort.   PAST MEDICAL HISTORY:  Coronary artery disease (see the August 11, 2006 note for details).  Well-preserved ejection fraction (EF 55% with  mild anterior hypokinesis).  Paroxysmal atrial fibrillation on chronic  Coumadin therapy.  Dyslipidemia.  Anxiety.  Benign prostatic  hypertrophy.  Ulcerative colitis.  Colonic polyps.  Previous  gastrointestinal bleeding.  Bilateral inguinal hernia repair.  Hammer-  toe surgery.   ALLERGIES:  SULFA, CODEINE.   MEDICATIONS:  1. Metoprolol 150 mg b.i.d.  2. Digoxin 0.125 mg daily.  3. Potassium 20 mEq daily.  4. Asacol.  5. Coumadin per [facility].  6. Niaspan 500 mg daily.  7. Protonix 40 mg daily.  8. Aspirin 81 mg daily.  9. Paxil 30 mg daily.  10.Lipitor 40 mg daily.   REVIEW OF SYSTEMS:  As stated in the HPI and otherwise negative for  other systems.   PHYSICAL EXAMINATION:  The patient is in no distress.  Blood pressure 128/88, heart rate 95 and irregular, weight 214 pounds.  HEENT:  Eyelids unremarkable.  Pupils are equal, round, and reactive to  light and accommodation.  Fundi not visualized.  Oral mucosa  unremarkable.  NECK:  No jugular venous distension at 45 degrees, carotid upstroke  brisk and symmetric, no bruits, thyromegaly.  LYMPHATICS:  No cervical, axillary, or inguinal adenopathy.  LUNGS:  Clear to auscultation bilaterally.  BACK:  No costovertebral angle tenderness.  CHEST:  Unremarkable.  HEART:  PMI not displaced or sustained, S1 and S2 within normal limits,  no S3, no S4, no clicks, rubs, murmurs.  ABDOMEN:  Obese, positive bowel sounds, normal in frequency and pitch,  no bruits, rebound, guarding.  No midline pulsatile masses or  organomegaly.  SKIN:  No rashes, no nodules.  EXTREMITIES:  With 2+ pulses throughout, no edema.   EKG:  Atrial fibrillation, no acute ST-T wave changes.   ASSESSMENT AND PLAN:  1. Atrial fibrillation.  The patient is not noticing his atrial      fibrillation now that the rate has slowed.  He is no longer      dyspneic.  At  this point, I want to apply a 48 hour monitor to see      if he has rate control, and also to see if he might not be      converting back and forth as he has been paroxysmal in the past.      At this point, since he is tolerating Coumadin and he is not having      any symptoms, I do not know that there is an advantage to      cardioversion, though will consider this.  He wants to defer the      monitor until his wife is through with an upcoming surgery.  2. Coronary artery disease.  He is not having any chest pain.  No      further cardiovascular testing is suggested.  He will continue with      secondary risk reduction.  3. Dyslipidemia per Dr. Morrie Sheldon.  4. Followup.  Will see the patient back in about 3 months or sooner if      needed.     [name], MD, [facility]  Electronically Signed    JH/MedQ  DD: 07/06/2007  DT: 07/07/2007  Job #: [id number]   cc:   Alfredia Client, MD

## 2011-04-07 NOTE — Assessment & Plan Note (Signed)
[facility] HEALTHCARE                            CARDIOLOGY OFFICE NOTE   Verbon, Giangregorio TERRENCE WISHON                        MRN:          [id number]  DATE:05/25/2007                            DOB:          05-Aug-1930    PRIMARY:  Dr. Vernon Prey.   REASON FOR PRESENTATION:  Evaluate patient with shortness of breath and  atrial fibrillation.   HISTORY OF PRESENT ILLNESS:  The patient was in his usual state of  health until about a week ago.  He noticed increasing shortness of  breath.  He went to [employer], was noted to be in atrial  fibrillation with a rapid rate of 124.  He was given digoxin 0.125 mg.  He also started taking Lasix 40 mg which he took for a few days.  He  then changed to 20 mg.  He says his breathing is now improved.  He has  not been having any chest discomfort.  He does not really notice the  palpitations.  He has had no presyncope or syncope.  He is not having  any chest pressure, neck discomfort, arm discomfort.  He has had no  increased swelling.   PAST MEDICAL HISTORY:  1. Coronary artery disease (see the August 11, 2006 note for      details).  2. Well preserved ejection fraction (EF 55% with mild anterior      hypokinesis).  3. Paroxysmal atrial fibrillation on chronic Coumadin therapy.  4. Dyslipidemia.  5. Anxiety.  6. Benign prostatic hypertrophy.  7. Ulcerative colitis.  8. Colonic polyps.  9. Previous gastrointestinal bleeding.  10.Benign inguinal hernia repair.  11.Hammertoe surgery.   ALLERGIES:  SULFA AND CODEINE.   MEDICATIONS:  1. Lipitor 40 mg daily.  2. Paxil 30 mg daily.  3. Aspirin 81 mg daily.  4. Protonix 40 mg daily.  5. Niaspan 500 mg daily.  6. Senokot.  7. Coumadin.  8. Metoprolol 100 mg b.i.d.  9. Asacol.  10.Potassium 20 mEq daily.  11.Digoxin 0.125 mg daily.  12.Lasix 20 mg daily.   REVIEW OF SYSTEMS:  As stated in the HPI and otherwise negative for  other systems.   PHYSICAL  EXAMINATION:  Patient is in no acute distress.  Blood pressure  112/72, heart rate 100 and irregular, weight 217 pounds.  HEENT:  Eyelids unremarkable, pupils equally round and reactive to  light, fundi not visualized, oral mucosa unremarkable.  NECK:  No jugular venous distention at 45 degrees, carotid upstroke  brisk and symmetrical, no bruits, no thyromegaly.  LYMPHATICS:  No cervical, axillary, inguinal adenopathy.  LUNGS:  Clear to auscultation without wheezing, crackles, or dullness to  percussion.  BACK:  No costovertebral angle tenderness.  CHEST:  Unremarkable.  HEART:  PMI not displaced or sustained, S1 and S2 within normal limits,  no S3, no murmurs.  ABDOMEN:  Obese, positive bowel sounds normal in frequency and pitch, no  bruits, no rebound, no guarding, no midline pulsatile mass, no  hepatomegaly, splenomegaly.  SKIN:  No rashes, no nodules.  EXTREMITIES:  With 2+ pulses throughout, no edema, no  cyanosis, no  clubbing.  NEURO:  Oriented to person, place, and time; cranial nerves II-XII  grossly intact, motor grossly intact.   EKG atrial fibrillation with a rapid rate (this was done on June 30), no  acute ST-T wave changes.   ASSESSMENT/PLAN:  1. Atrial fibrillation.  The patient is not noticing this other than      the dyspnea.  This has been treated with a diuretic.  I am going to      try to slow this rate down a little bit more.  I am going to have      him take his metoprolol 150 mg twice a day.  He is on the Coumadin      already.  He has had paroxysms of this, may convert on his own.  He      will let me know if he has any lightheadedness, increasing dyspnea,      or palpitations.  2. Coronary disease, having no ongoing chest pain.  No further      cardiovascular testing is suggested.  He will continue with      secondary risk reduction.  3. Dyslipidemia.  This is followed by Dr. Christell Constant.  4. Obesity.  We did have a discussion about the need to lose weight       with diet and exercise.  5. Followup.  I will see him again in a month.  I have given his      written instruction to get a BMET next week, along with an EKG.     [name], MD, [facility]  Electronically Signed    JH/MedQ  DD: 05/25/2007  DT: 05/26/2007  Job #: [id number]   cc:   [name], M.D.

## 2011-04-07 NOTE — Assessment & Plan Note (Signed)
[facility] HEALTHCARE                            CARDIOLOGY OFFICE NOTE   [name], [name]                        MRN:          [id number]  DATE:03/14/2008                            DOB:          08-02-1930    PRIMARY CARE PHYSICIAN:  Dr. Vernon Prey.   REASON FOR PRESENTATION:  A patient with coronary disease and atrial  fibrillation.   HISTORY OF PRESENT ILLNESS:  The patient presents for followup of the  above.  Since I last saw him, he has had pneumonia.  He is slowly  recovering from this.  He was exercising daily prior to this.  He has  not done that in a couple of weeks.  He is now over the pneumonia and  says that his breathing is okay.  He is not having any chest discomfort,  neck or arm discomfort.  He is not noticing any palpitations and has had  no presyncope or syncope.  He is actually in atrial fibrillation  currently and does not notice this rhythm.   PAST MEDICAL HISTORY:  Coronary artery disease (see the September 2007  note for the details), well-preserved ejection fraction (EF 55% with  mild anterior hypokinesis), paroxysmal atrial fibrillation,  dyslipidemia, anxiety, benign prostatic hypertrophy, ulcerative colitis,  colonic polyps, previous GI bleeding, bilateral inguinal hernia repair,  hammertoe surgery.   ALLERGIES:  SULFA AND CODEINE.   MEDICATIONS:  Metoprolol 150 mg b.i.d., fish oil, calcium, furosemide 40  mg daily, Asacol, pantoprazole, digoxin 125 mcg daily, Klor-Con 20 mEq  daily, aspirin 81 mg daily, Lipitor 80 daily, Coumadin per [employer] [facility]  in the a.m., paroxetine 30 mg daily, Niaspan 500 mg daily, vitamin D.   REVIEW OF SYSTEMS:  As stated in the HPI and otherwise negative for  other systems.   PHYSICAL EXAMINATION:  The patient is in no distress.  Blood pressure  118/60, heart rate 103 and irregular, weight 215 pounds, body mass index  29.  NECK:  No jugular venous distention at 45 degrees.  Carotid upstrokes  brisk and symmetrical.  No bruits, no thyromegaly.  LYMPHATICS:  No adenopathy.  LUNGS:  Clear to auscultation bilaterally.  BACK:  No costovertebral angle tenderness.  HEART:  PMI not displaced or sustained.  S1 and S2 within normal limits.  No S3.  No murmurs.  ABDOMEN:  Obese.  Positive bowel sounds.  Normal in frequency and pitch.  No bruits, rebound, guarding or midline pulsatile mass.  No  organomegaly.  SKIN:  No rashes.  No nodules.  EXTREMITIES:  2+ pulses throughout.  No edema.   ASSESSMENT/PLAN:  1. Coronary disease.  Patient is having no symptoms related to this.      No further cardiovascular testing is suggested.  He will continue      with secondary risk reduction.  2. Atrial fibrillation.  The patient tolerates this and has reasonable      rate control.  He is on chronic anticoagulation.  No further      intervention nor testing is warranted.  3. Dyslipidemia.  Per Dr. Christell Constant, the goal would  be an LDL of less than      100 and HDL greater than 40.  4. Followup.  Will see the patient again in 6 months or sooner if      needed.     [name], MD, [facility]  Electronically Signed    JH/MedQ  DD: 03/14/2008  DT: 03/14/2008  Job #: [id number]   cc:   [name], M.D.

## 2011-04-07 NOTE — Assessment & Plan Note (Signed)
[facility] HEALTHCARE                            CARDIOLOGY OFFICE NOTE   Gregory, [name]                        MRN:          [id number]  DATE:08/29/2008                            DOB:          08/22/1930    PRIMARY CARE PHYSICIAN:  [name], MD.   REASON FOR PRESENTATION:  Evaluate the patient with coronary artery  disease and atrial fibrillation.   HISTORY OF PRESENT ILLNESS:  The patient returns for 65-month followup.  Since I last saw him, he has done well.  He is exercising 4 days a week.  He states he is not having any problems with this.  He is not having any  chest discomfort, neck, or arm discomfort.  He does occasionally feel  that his heart is going fast after he exercises.  Otherwise, he does not  feel palpitations.  He has had no presyncope or syncope.  He has had no  PND or orthopnea.   PAST MEDICAL HISTORY:  Coronary artery disease (see the August 02, 2006, note for details, well-preserved ejection fraction EF 55% with  mild anterior hypokinesis), paroxysmal atrial fibrillation,  dyslipidemia, anxiety, benign prostatic hypertrophy, ulcerative colitis,  colonic polyps, previous GI bleed, bilateral inguinal hernia repair, and  hammertoe surgery.   ALLERGIES:  SULFA and CODEINE.   MEDICATIONS:  1. Metoprolol 150 mg b.i.d.  2. Fish oil.  3. Calcium.  4. Pantoprazole 40 mg daily.  5. Digoxin 125 mcg daily.  6. Klor-Con 20 mEq daily.  7. Aspirin 81 mg daily.  8. Lipitor 80 mg daily.  9. Coumadin.  10.Paroxetine 30 mg daily.  11.Niaspan 500 mg nightly.  12.Vitamin D.  13.Asacol 1200 mg t.i.d.   REVIEW OF SYSTEMS:  As stated in the HPI and otherwise negative for  other systems.   PHYSICAL EXAMINATION:  GENERAL:  The patient is in no distress.  VITAL SIGNS:  Blood pressure 104/72, heart 96 and regular, weight 216  pounds, and body mass index is 29.  NECK:  No jugular venous distention at 45 degrees, carotid upstroke  brisk and symmetrical, no bruits, no thyromegaly.  LYMPHATICS:  No cervical, axillary, or inguinal adenopathy.  LUNGS:  Clear to auscultation bilaterally.  BACK:  No costovertebral angle tenderness.  CHEST:  Unremarkable.  HEART:  PMI not displaced or sustained, S1 and S2 within normal limits,  no S3, no murmurs.  ABDOMEN:  Obese, positive bowel sounds.  Normal in frequency and pitch,  no bruits, no rebound, no guarding or midline pulsatile mass.  No  organomegaly.  SKIN:  No rashes, no nodules.  EXTREMITIES:  Pulses are 2+, trace edema.  NEURO:  Grossly intact.   ASSESSMENT AND PLAN:  1. Atrial fibrillation.  The patient's rate is slightly elevated      today.  He wore a monitor few years back and he had good rate      control.  He is going to let me know if he has any increasing      palpitations, at which point, I will have him re-wear a monitor.  For now, he will continue on the Coumadin in the meds as listed.  2. Coronary artery disease.  He is having no symptoms.  He had a      stress perfusion study in 2007.  No further cardiovascular testing      is suggested.  He will continue with risk reduction.  3. Dyslipidemia per Dr. Christell Constant.  The goals are an LDL less 100 and HDL      greater than 40.     [name], MD, [facility]  Electronically Signed    JH/MedQ  DD: 08/29/2008  DT: 08/29/2008  Job #: [id number]   cc:   [name], M.D.

## 2011-04-07 NOTE — Assessment & Plan Note (Signed)
[facility] HEALTHCARE                            CARDIOLOGY OFFICE NOTE   [name], [name] [name]                        MRN:          [id number]  DATE:09/07/2007                            DOB:          01-24-1930    PRIMARY CARE PHYSICIAN:  Dr. Vernon Prey.   REASON FOR PRESENTATION:  Evaluate patient with atrial fibrillation.   HISTORY OF PRESENT ILLNESS:  Patient returns for followup.  After the  last visit he did wear a Holter monitor that demonstrated that his  atrial fibrillation had a well-controlled rate with occasional pauses  with heightened vagal tone at night.  He is not having any  lightheadedness, presyncope, or syncope.  He is occasionally noticing  his palpitations.  I think this is paroxysmal atrial fibrillation.  He  is not having any chest discomfort, neck or arm discomfort.  He is not  having any new shortness of breath.   PAST MEDICAL HISTORY:  1. Coronary artery disease (see the August 11, 2006 note for      details).  2. Well-preserved ejection fraction (EF 55% with mild anterior      hypokinesis).  3. Paroxysmal atrial fibrillation on chronic Coumadin therapy.  4. Dyslipidemia.  5. Anxiety.  6. Benign prostatic hypertrophy.  7. Ulcerative colitis.  8. Colon polyps.  9. Previous gastrointestinal bleeding.  10.Bilateral inguinal hernia repair.  11.Hammertoe surgery.   ALLERGIES:  1. SULFA.  2. CODEINE.   MEDICATIONS:  1. Fish oil.  2. Metoprolol 150 mg b.i.d.  3. Digoxin 125 mcg daily.  4. Potassium 20 mEq daily.  5. Asacol.  6. Metoprolol 100 mg b.i.d..  7. Coumadin.  8. Senior vitamin.  9. Niaspan 500 mg daily.  10.Protonix 40 mg daily.  11.Coated aspirin 81 mg daily.  12.Paxil 30 mg daily.  13.Lipitor 40 mg daily.   REVIEW OF SYSTEMS:  As stated in the HPI, and otherwise negative for  other systems.   PHYSICAL EXAMINATION:  Patient is in no distress.  Blood pressure 108/74.  Heart rate 56 and regular.  Weight 216  pounds.  HEENT:  Eyes unremarkable.  Pupils equal, round, and reactive to light.  Fundi not visualized.  NECK:  No jugular venous distention.  Wave form within normal limits.  Carotid upstroke brisk and symmetric.  No bruits.  LUNGS:  Clear to auscultation bilaterally.  HEART:  PMI not displaced or sustained.  S1 and S2 within normal limits.  No S3.  No S4.  No clicks.  No rubs.  No murmurs.  ABDOMEN:  Positive bowel sounds.  Normal in frequency and pitch.  No  bruits.  No rebound.  No guarding.  No midline pulsatile mass.  No  organomegaly.  SKIN:  No rashes.  No nodules.  EXTREMITIES:  Two plus pulses.  No edema.   ASSESSMENT AND PLAN:  1. Atrial fibrillation.  The patient has paroxysmal atrial      fibrillation with a controlled rate.  At this point, no further      cardiovascular testing is suggested.  He is tolerating Coumadin per  [facility] [employer] [facility].  2. Coronary artery disease.  He will continue with medications as      listed.  No further cardiovascular testing is suggested.  He will      continue with risk reduction.  3. Dyslipidemia.  Per Dr. Christell Constant with a goal LDL less than 100, and HDL      greater than 40.  4. Followup.  I will the patient back in 6 months, or sooner if      needed.     [name], MD, [facility]  Electronically Signed    JH/MedQ  DD: 09/07/2007  DT: 09/08/2007  Job #: [id number]   cc:   [name], M.D.

## 2011-04-08 ENCOUNTER — Encounter: Payer: Self-pay | Admitting: Cardiology

## 2011-04-10 NOTE — Discharge Summary (Signed)
[name], [name] NO.:  [id number]   MEDICAL RECORD NO.:  [id number]          PATIENT TYPE:  INP   LOCATION:  6529                         FACILITY:  [facility]   PHYSICIAN:  Learta Codding, M.D. LHCDATE OF BIRTH:  07-19-1930   DATE OF ADMISSION:  08/24/2005  DATE OF DISCHARGE:  08/29/2005                                 DISCHARGE SUMMARY   PRIMARY CARDIOLOGIST:  [name], M.D.   PRIMARY CARE PHYSICIAN:  [name], M.D.   PRINCIPAL DIAGNOSIS:  1.  Atrial fibrillation with rapid ventricular response.   SECONDARY DIAGNOSES:  1.  Congestive heart failure.  2.  Out of hospital defibrillation arrest in September, 2004, secondary to      myocardial infarction, Taxus stent the right coronary artery and left      anterior descending.  3.  Residual nonobstructive coronary artery disease in the ramus      intermedius, right coronary artery (small right ventricular branch 90%).  4.  Status post transesophageal echo-guided cardioversion this admission      with an ejection fraction of 45%, in sinus rhythm at discharge.  5.  Hyperlipidemia.  6.  Family history of coronary artery disease.  7.  Ulcerative colitis.  8.  Moderate mitral regurgitation, 2-3+.  9.  Anticoagulation started with Coumadin started this admission.  10. Anemia.  11. Dyslipidemia.   ALLERGIES:  SULFA and CODEINE.   PROCEDURES:  Transesophageal echo-guided direct current cardioversion.   HOSPITAL COURSE:  [name] is a 77 year old male with a history of  coronary artery disease.  A week prior to admission, he was working with  weights and had abdominal tightness and increased dyspnea on exertion  afterwards.  His symptoms did not improve, so he went to his family doctor.  He was in atrial fibrillation with rapid ventricular response with a heart  rate in the 170s.  He was sent to the emergency room where he was admitted  for further evaluation and treatment.   His atrial  fibrillation remained difficult to control.  Because of this, a  TEE cardioversion was scheduled and performed on August 27, 2005.  He had  two biphasic cardioversions, one at 120 joules and one at 150 joules, and  converted to sinus rhythm with the second one.  There were no complications.  On the TEE, it showed an overall mildly reduced LV function with an EF of  45%.  There was no thrombus.  He had an aneurysmal atrial septum and mild  atherosclerosis in the descending aorta.  He had a mildly thickened mitral  valve and moderate MR as well.  There was a PFO by color.  For full details,  see the transcribed report.   [name] was loaded with heparin and Coumadin.  He had Coumadin 5 mg a day  for four days, and at the end of that, his INR was 2.0.  He is to be  discharged on alternating 5 mg and 2.5 mg.  He is to get an INR on Monday.  [name] had symptoms of volume overload on  admission.  He had findings  suggestive of pulmonary edema on chest x-ray and once his heart rate was  stabilized, he was diuresed.  He was initially on IV Lasix, but this was  changed to p.o.  He was also hypokalemic and required potassium  supplementation.  He is to follow up with Dr. Kathi Der office with a BMET in  two weeks.   A D-dimer was checked and was elevated.  A CT angiogram of the chest was  performed which showed no thoracic aortic dissection or aneurysm and no  pulmonary emboli.  There was no significant pericardial effusion.  Congestive heart failure was seen and elevated right heart pressures were  seen as well.   [name] has a history of ulcerative colitis.  He had no problems during  his hospital stay and was continued on the mesalamine.  He is to follow up  as needed as an outpatient.   By August 29, 2005, [name] was maintaining sinus rhythm.  His  respiratory status was at baseline.  He was anemic with a hemoglobin of 11.7  and hematocrit of 34.1.  This was stable, and he is to  follow up as an  outpatient at Dr. Kathi Der office with a CBC when he gets his follow-up BMET  and iron studies at that time as well.  [name] was evaluated by Learta Codding, M.D. and considered stable for discharge with outpatient followup  arranged.   DISCHARGE INSTRUCTIONS:  1.  His activity level is to be increased gradually.  2.  He is to stick to a diet that is low in salt and fat.  3.  He is follow up to Dr. Antoine Poche on October 07, 2005 at noon.  4.  He is get a CBC and a BMET in two weeks with iron studies.  5.  He is to get a Coumadin check on Monday at Dr. Kathi Der office.   DISCHARGE MEDICATIONS:  1.  Lipitor 40 mg daily.  2.  Paxil 30 mg daily.  3.  Asacol 400 mg 3 tablets t.i.d.  4.  Protonix 40 mg daily.  5.  Niaspan 500 mg daily.  6.  Vitamins daily.  7.  Coumadin 5 mg, alternate with 1 tablet and 1/2 tablet.  8.  Furosemide 40 mg daily.  9.  Aspirin 81 mg daily.  10. Metoprolol 100 mg b.i.d.  11. K-Dur 20 mEq daily.   DURATION OF ENCOUNTER:  37 minutes.      Theodore Demark, P.A. [facility]      Learta Codding, M.D. [facility]  Electronically Signed    RB/MEDQ  D:  08/29/2005  T:  08/29/2005  Job:  [id number]   cc:   [name], M.D.  [address]  [address]  [place]  [place] [address]   [name], M.D.  Fax: [phone]

## 2011-04-10 NOTE — Consult Note (Signed)
NAME:  [name], SPRATLEY NO.:  [id number]   MEDICAL RECORD NO.:  [id number]                   PATIENT TYPE:  INP   LOCATION:  2921                                 FACILITY:  [facility]   PHYSICIAN:  Learta Codding, M.D.                 DATE OF BIRTH:  08-02-1930   DATE OF CONSULTATION:  DATE OF DISCHARGE:                                   CONSULTATION   REFERRING PHYSICIAN:  Erasmo Downer, MD   REASON FOR REFERRAL:  Out of hospital cardiac arrest secondary to myocardial  infarction.   HISTORY OF PRESENT ILLNESS:  [name] is a 77 year old white male who  lives in [place].  The patient has been seen, in the past, by Dr. Alanda Amass;  however, he wanted to have his care transferred to Dr. Antoine Poche in [place].  The patient reportedly has undergone ischemia evaluation in the past with  exercise testing, although we do not know when this was done.  Yesterday the  patient reports a sudden onset of substernal chest pain. He was initially  evaluated by EMS and a 12-lead EKG was obtained and no acute changes were  found.  The patient was offered to go to the emergency room, but preferred  to stay home. Later that night, according to the wife's history, the patient  on returning from the bathroom suddenly collapsed.  She found him between  the wall and the dresser, having fallen over backwards, quite cyanotic and  making gurgling sounds.  She immediately called 911 and following this  performed some chest compressions and some ventilatory efforts.  She also  called her neighbor who subsequently started performing CPR.  EMS then  arrived at the scene which may well have been 10-15 minutes later although  this is not clear.  The patient was found to be in ventricular fibrillation  and was given a 200 joules countershock with restoration of atrial  fibrillation.  He then degenerated again into ventricular fibrillation and a  second shock of 300 joules was given.   Initially, the patient was found to  be in atrial fibrillation, but then subsequently developed normal sinus  rhythm.  He was briefly hypotensive and required Neo-Synephrine at [facility].  On arrival to the emergency room the patient was not  intubated yet and Dr. ________ performed the intubation.  He was then placed  on mechanical ventilation.  The patient was given sedation.  The patient was  then asked to be transferred to [facility] for further management.  His initial cardiac enzymes returned positive with troponins of 5.4 on  initial peak.  By this time the patient had been weaned off Neo-Synephrine  and had remained hemodynamically stable.  No complications were encountered  on the transfer to [facility].  On arrival her a 2-D  echocardiographic study was performed which showed moderate LV  systolic  dysfunction with an ejection fraction of 40% and with evidence of  anteroapical and anterolateral scar.  There was no significant valvular  disease.  There was clearly no significant mitral regurgitation.  The  patient does respond to painful stimuli and is making some purposeful facial  gestures.  His neurological exam demonstrates an intact brain stem.  A 12-  lead electrocardiogram from [facility] demonstrates ST  segment changes in inferolateral leads, but no significant Q waves or ST  elevation.   ALLERGIES:  CODEINE causes nausea.   MEDICATIONS:  The patient was taken Prozac and Zocor as an outpatient. He  did not take aspirin due to a history of GI bleed.   PAST MEDICAL HISTORY:  1. History of ulcerative colitis.  2. History of GI bleed secondary to peptic ulcer.  3. History of dyslipidemia.  4. History of anxiety.  5. History of possible atrial fibrillation.   SOCIAL HISTORY:  The patient used to smoke 30 years ago, does not smoke  presently.  He lives with his wife. He is retired.   FAMILY HISTORY:  Notable for  significant coronary artery disease and a  history of sudden death in 1 family member.   REVIEW OF SYSTEMS:  Unable to be obtained, as the patient is intubated and  on mechanical ventilation.   PHYSICAL EXAMINATION:  VITAL SIGNS:  Blood pressure 110/65, heart rate is 72  beats/minute, temperature is afebrile.  GENERAL:  An intubated white male in no distress, sedated.  No definite JVD.  LUNGS:  Clear.  HEART:  Regular rate and rhythm.  No pathological murmurs.  Normal S1 and  S2.  ABDOMEN:  Appears soft.  EXTREMITIES:  There is no clubbing, cyanosis, or edema.   LABS:  The 12-lead echocardiogram as outlined above.   Positive troponins.  White count is elevated at 14.7.  Hemoglobin is stable.  Please refer to the laboratory section.   IMPRESSION AND PLAN:  1. Out of hospital cardiac arrest secondary to myocardial infarction. The     patient was found in ventricular fibrillation and after counter shock     restored normal sinus rhythm he did have substernal chest pain prior to     this event suggesting ishemic cause.  The patient will need a further     workup which will include a cardiac catheterization.  He may also need an     implantable defibrillator.  Interestingly his electrocardiogram shows no     significant Q waves or ST elevation. His echo also demonstrates a scar in     the anteroapical, anterolateral wall.  The latter makes me wonder whether     the patient could have initially monomorphic ventricular tachycardia     triggered by ischemia in a remote coronary distribution. However, the     distinction is rather somatic and the patient will need a cardiac     catheterization and a defibrillator.  He is currently hemodynamically     stable. He will be managed with aspirin, nitroglycerin, and heparin.  2. Rule out intracranial event.  The patient will undergo a CT scan to make     sure that he does not have a cerebral bleed. If this is negative he can    be safely started  on IV heparin.  3. Rule out anoxic brain injury.  The patient has, in fact, brain stem     reflexes.  He also has good prognostic indicators.  He is making  purposeful gestures.  He is responding to painful stimuli.  We will have     neurology see the patient in the morning to further define his prognosis.  4. Hypotension, resolved.    DISPOSITION:  The patient will be maintained intubated and on mechanical  ventilation for tonight.  We will obtain a CT scan and decide on the timing  of a cardiac catheterization pending his neurological recovery.                                               Learta Codding, M.D.    GED/MEDQ  D:  07/29/2003  T:  07/29/2003  Job:  [id number]   cc:   Erasmo Downer, MD   Learta Codding, M.D.  [address]  [address]  [place]  [place] [address]   [name], M.D.

## 2011-04-10 NOTE — H&P (Signed)
NAME:  [name], [name] NO.:  [id number]   MEDICAL RECORD NO.:  [id number]                   PATIENT TYPE:  INP   LOCATION:  2013                                 FACILITY:  [facility]   PHYSICIAN:  [name], M.D.                DATE OF BIRTH:  12/03/1929   DATE OF ADMISSION:  12/20/2003  DATE OF DISCHARGE:                                HISTORY & PHYSICAL   REASON FOR ADMISSION:  Mr. Mcintire is a very pleasant 77 year old male, well  known to Dr. [name], with a history of coronary artery disease  notable for out-of-hospital cardiac arrest secondary to ventricular  fibrillation in September, 2004.  The patient was treated in the field with  CPR and defibrillator and initially stabilized at [facility] prior to  transfer to [facility].  He subsequently ruled in for a myocardial  infarction with positive cardiac enzymes; however, cardiac catheterization  was deferred secondary to hypoxic encephalopathy and confusion.  Arrangements were thus made for him to return for elective percutaneous  intervention.  This was performed on October 18th with placement of a Taxus  stent for treatment of a 99% mid LAD lesion.  Residual anatomy notable for  an 80% proximal LAD lesion but otherwise noncritical coronary artery  disease.  Left ventricular function was preserved.  EF 55%.   Patient was subsequently enrolled in cardiac rehab at [facility] and  is about to complete his program next week.  He has done beautifully since  his intervention with no complaint of any exertional symptoms of either  chest pain or dyspnea; however, since awakening this morning, the patient  has had intermittent episodes of brief, dull anterior chest pain (5/10) with  no associated dyspnea, diaphoresis, nausea, or radiation.  He has not taken  any nitroglycerin.  Patient presented to his primary care physician and was  referred to [facility] emergency room.  He  was painfree on admission but has  since reported some brief recurrent episodes of chest discomfort.  Electrocardiogram reveals NSR with no acute changes.   ALLERGIES:  1. SULFA.  2. CODEINE.   CURRENT MEDICATIONS:  1. Lipitor 40 mg q.d.  2. Paxil 30 mg q.d.  3. Metoprolol 25 mg b.i.d.  4. Lisinopril 5 mg q.d.  5. Aspirin 325 mg q.d.  6. Plavix 75 mg q.d.  7. Asacol 1200 mg t.i.d.  8. Risperdal 2 mg 1/2 q.h.s. p.r.n.  9. Protonix 40 mg q.d.   PAST MEDICAL HISTORY:  1. Dyslipidemia.  2. Anxiety disorder.  3. BPH.  4. Colon polyps.  5. Right inguinal hernia repair.  6. Right toe joint and hammertoe surgery.   SOCIAL HISTORY:  Patient is married, retired.  No children.  Denies smoking  tobacco.  Denies alcohol use.   FAMILY HISTORY:  Positive for later-onset heart disease in multiple first-  degree relatives (including brothers).  REVIEW OF SYSTEMS:  Denies any recent development of exertional dyspnea,  orthopnea, PND, pedal edema, or tachycardic palpitations.  Denies any recent  fever, productive cough, chills.  Denies hemoptysis, hematuria, or melena.  Denies any history of gastroesophageal reflux disease or recent symptoms of  heartburn.  The remaining symptoms negative.   PHYSICAL EXAMINATION:  VITAL SIGNS:  Blood pressure 110/65, pulse 64 and  regular, respirations 12, temperature 97.3, SaO2 92% (room air).  GENERAL:  A 77 year old male in no apparent distress.  HEENT:  Normocephalic and atraumatic.  NECK:  Preserved bilateral carotid pulses without bruits.  LUNGS:  Clear to auscultation in all fields.  HEART:  Regular rate and rhythm (S1 and S2).  No significant murmurs.  ABDOMEN:  Soft and nontender.  Intact bowel sounds.  EXTREMITIES:  Preserved bilateral femoral pulses without bruits.  Intact  distal pulses with no significant pedal edema.  NEUROLOGIC:  No focal deficits.   CHEST X-RAY:  Pending.   ELECTROCARDIOGRAM:  Normal sinus rhythm with 61 beats per  minute.  Normoactive.  Symmetric T wave in aVL.  No acute changes.   LABORATORY DATA:  WBC 6.3, hemoglobin 12.6, hematocrit 37, platelets 219.  INR 0.9.  Sodium 138, potassium 4.7, glucose 116, BUN 12, creatinine 1.1.  Liver enzymes normal.  Initial cardiac enzymes normal.   IMPRESSION:  A 77 year old male with a history of out-of-hospital  ventricular fibrillation arrest requiring resuscitation in the field, status  post elective stenting of a high-grade mid left anterior descending artery  lesion with normal left ventricular function, who has been doing extremely  well since his intervention in October, 2004 and who now presents to the  emergency room with complaints of new-onset, recurrent dull chest pain of  brief duration with no associated symptoms and no acute electrocardiographic  changes.  Initial cardiac markers are negative.   PLAN:  Following review with Dr. Antoine Poche, the recommendation is to proceed  with admission for rule out myocardial infarction and continue monitoring on  telemetry.  We will cycle cardiac markers, and if negative, will proceed  with an exercise stress Cardiolite here in the hospital the following  morning.  We will continue the patient  on current home medication regimen, repeat the addition of intravenous  heparin.  If he has recurrent pain, intravenous nitroglycerin will be added  as well.  If serial cardiac markers are abnormal, then the patient will be  referred for coronary angiography and possible percutaneous intervention.      Gene Serpe, P.A. [facility]                      [name], M.D.    GS/MEDQ  D:  12/20/2003  T:  12/20/2003  Job:  [id number]   cc:   [name], M.D.  [address] [place]  [place] [address]  Fax: [phone]

## 2011-04-10 NOTE — Discharge Summary (Signed)
NAME:  [name], [name] NO.:  [id number]   MEDICAL RECORD NO.:  [id number]                   PATIENT TYPE:  INP   LOCATION:  2013                                 FACILITY:  [facility]   PHYSICIAN:  [name], M.D.                DATE OF BIRTH:  1930/11/06   DATE OF ADMISSION:  12/20/2003  DATE OF DISCHARGE:  12/21/2003                           DISCHARGE SUMMARY - REFERRING   PROCEDURE:  Exercise stress Cardiolite on December 21, 2003.   REASON FOR ADMISSION:  Please refer to dictated admission note.   LABORATORY DATA:  Cardiac enzymes:  Normal (x3).  Electrolytes and renal  function normal.  Liver enzymes normal.  Glucose 116 on admission.  WBC 6.3,  hemoglobin 12.6, hematocrit 37 (MCV 93), platelets 219.   Admission chest x-ray; Cardiomegaly; no acute abnormalities; resolution of  previously seen congestive heart failure.   HOSPITAL COURSE:  Following presentation to the emergency room with  complaint of new onset, nonexertional chest pain, the patient was admitted  for further observation and rule out of myocardial infarction.  His symptoms  were felt to be atypical for ischemia.   Serial cardiac markers were all within normal limits.  The patient was thus  referred for an exercise stress Cardiolite, the patient exercised a total of  10 minutes 22 seconds on standard Bruce protocol, exceeding target heart  rate.  Subsequent review of perfusion images revealed no definite evidence  of ischemia with anterior wall scar and calculated ejection fraction of 57%.   This was reviewed with Dr. Antoine Poche, who cleared the patient for discharge.   No adjustments of home medication regimen was made during this brief stay.   DISCHARGE MEDICATIONS:  The patient is instructed to resume all previous  home medications.   DISCHARGE INSTRUCTIONS:  The patient is clear to resume and complete his  cardiac rehab program at [facility].  The patient is instructed  to  follow up with [name], M.D. as previously scheduled and to contact  our office for any recurrent or worsening symptoms.   DISCHARGE DIAGNOSES:  1. Atypical chest pain.     A. Normal serial cardiac markers.     B. Nonischemic adequate exercise stress Cardiolite on December 21, 2003.  2. Severe single vessel coronary artery disease.     A. Status post elective stent (Taxus) left anterior descending in October        of 2004.     B. Status post out of hospital ventricular fibrillation arrest in        September of 2004.     C. Preserved left ventricular function.  3. Dyslipidemia.  4. Anxiety disorder.      Gene Serpe, P.A. [facility]                      [name], M.D.    GS/MEDQ  D:  12/21/2003  T:  12/22/2003  Job:  [id number]   cc:   [name], M.D.  [address] [place]  [place] [address]  Fax: [phone]

## 2011-04-10 NOTE — H&P (Signed)
[name], [name]                 ACCOUNT NO.:  [id number]   MEDICAL RECORD NO.:  [id number]          PATIENT TYPE:  INP   LOCATION:  6529                         FACILITY:  [facility]   PHYSICIAN:  Thomas C. Wall, M.D.   DATE OF BIRTH:  1930/05/02   DATE OF ADMISSION:  08/24/2005  DATE OF DISCHARGE:                                HISTORY & PHYSICAL   PRIMARY CARE PHYSICIAN:  [name], M.D.   PRIMARY CARDIOLOGIST:  [name], M.D.   CHIEF COMPLAINT:  Abdominal tightness.   HISTORY OF PRESENT ILLNESS:  [name] is a 77 year old male with a history  of coronary artery disease.  He was working out with [employer], which he does  on a routine basis, but it is fairly strenuous, and had abdominal tightness  and increased dyspnea on exertion afterwards.  He had no chest pain.  He had  no palpitations.  He contacted his primary care physician when his symptoms  did not improve, and his family doctor saw him, but sent him to the  emergency room.  He was in atrial fibrillation with rapid ventricular  response and a heart rate in the 170s at times.  Mr. Karwowski denies any  symptoms of syncope/presyncope.  He has no evidence of an irregular heart  rate.  He denies palpitations.  He has had no lower extremity edema.  He  feels that his abdomen is tight, and he cannot lie flat.  He does not have  any other symptoms of which he is aware.   PAST MEDICAL HISTORY:  1.  Out-of-hospital defib arrest in September of 2004.  He ruled in for an      MI, and had a TAXUS stent to the osteal RCA, to the LAD for an 80% and      99% stenosis.  2.  Residual coronary artery disease in the circumflex ramus intermedius at      25%, the RCA at 50%, and RV branch 90%.  EF was 55% with mild anterior      hypokinesis and small RCA to LAD collaterals.  3.  Hyperlipidemia.  4.  Family history of coronary artery disease.  5.  History of ulcerative colitis.   SURGICAL HISTORY:  He has had cardiac  catheterizations, as well as hernia  repair and foot surgery.   ALLERGIES:  He is allergic to intolerant to -  1.  SULFA.  2.  CODEINE.  (Aspirin is listed, but he currently takes it and has not had any  problems.).   CURRENT MEDICATIONS:  1.  Lipitor 40 mg daily.  2.  Paxil 30 mg daily.  3.  Metoprolol 50 mg 1-2 b.i.d.  4.  Lisinopril 5 mg daily.  5.  Aspirin 81 mg daily.  6.  Asacol 400 mg three tablets t.i.d.  7.  Protonix 40 mg daily.  8.  Niaspan 500 daily.  9.  Multivitamin daily.   SOCIAL HISTORY:  He lives in [place] with his wife, and his retired from  [employer].  He quit tomorrow more than 30 years ago.  He  says that he drinks a  fair amount of sodas which have caffeine in them.  He does try to exercise.   FAMILY HISTORY:  His mother died at age 35, and his father died at age 84.  His father had heart disease; his mother did not.  His 4 brothers had heart  disease.   REVIEW OF SYSTEMS:  Significant for occasional arthralgias.  His ulcerative  colitis has been well controlled recently, nondistended he has had no  hematemesis, hemoptysis, or melena.  He has had shortness of breath, as well  as dyspnea on exertion and orthopnea, but he denies PND or edema or  palpitations.  He also denies syncope and presyncope, as well as coughing  and wheezing.  Review of systems is otherwise negative.   PHYSICAL EXAMINATION:  VITAL SIGNS:  Temperature 98.2, blood pressure 94/56,  heart rate 176, respiratory rate 22, O2 saturation 94% on 2 liters.  GENERAL:  He is a well-developed elderly white male in no acute distress.  HEENT:  His head is normocephalic and atraumatic.  Pupils equal, round and  reactive to light and accommodation.  Extraocular movements intact.  Nose  without discharge.  NECK:  He has JVD to his jaw line.  He has no lymphadenopathy, thyromegaly,  and no carotid bruits are appreciated, although his heart rate is rapid  enough to make this difficult to assess.   CARDIOVASCULAR:  His heart is irregular in rate and rhythm with an S1 and  S2, and no significant murmur is noted, but his heart rate is increased.  His distal pulses are 1 to 2+, and no femoral bruits are noted.  LUNGS:  He has decreased breath sounds at the bases.  No acute crackles are  noted.  ABDOMEN:  Soft and nontender with active bowel sounds.  EXTREMITIES:  He has trace edema, but no rashes or lesions are noted.  MUSCULOSKELETAL:  There is no joint deformity or effusions, and no spine or  CVA tenderness.  NEUROLOGIC:  He is alert and oriented.  Cranial nerves II-XII are grossly  intact.   EKG:  Atrial fibrillation with rapid ventricular response and no acute  ischemic changes.   LABORATORY DATA:  Pending.   ASSESSMENT AND PLAN:  1.  Atrial fibrillation with rapid ventricular response.  The patient was      seen by Dr. Daleen Squibb, who recommended increasing the Cardizem to a maximum      of 20 mg an hour for better rate control.  His atrial fibrillation is of      unknown duration, and he will need assessment with an echocardiogram to      make sure his EF was unchanged, and anticoagulation.  2.  Possible congestive heart failure.  He seems volume overloaded by exam.      Chest x-ray is pending at the time of dictation.  His EF was 55% at      catheterization.  Will check an echocardiogram and diurese once his      blood pressure is stable on the Cardizem.  3.  Anticoagulation.  He will be started on heparin and Coumadin per      pharmacy.  4.  History of coronary artery disease.  Symptoms began after a work-out, so      will check enzymes and an echocardiogram, but no ischemic evaluation is      planned, unless there are abnormalities noted in these tests.  5.  Ulcerative colitis.  The patient is at  slightly increased risk for      anticoagulation because of this, but his      symptoms are well controlled at this time.  Will follow his hemoglobin     and hematocrit, as well as  continue his home medication of Asacol.   Dr. Juanito Doom saw the patient and determined the plan of care.      Theodore Demark, P.A. [facility]      Thomas C. Wall, M.D.  Electronically Signed    RB/MEDQ  D:  08/24/2005  T:  08/24/2005  Job:  [id number]

## 2011-04-10 NOTE — Cardiovascular Report (Signed)
   NAME:  [name], [name] NO.:  [id number]   MEDICAL RECORD NO.:  [id number]                   PATIENT TYPE:  OIB   LOCATION:  2899                                 FACILITY:  [facility]   PHYSICIAN:  [name], M.D.                DATE OF BIRTH:  [birth year]   DATE OF PROCEDURE:  09/10/2003  DATE OF DISCHARGE:                              CARDIAC CATHETERIZATION   PRIMARY CARE PHYSICIAN:  Dr. Rudi Heap.   PROCEDURE:  Left heart catheterization, coronary arteriography.   INDICATION:  Evaluate patient with an anterior myocardial infarction and  cardiogenic shock.   PROCEDURE NOTE:  Left heart catheterization was performed via the right  femoral artery.  The artery was cannulated using anterior wall puncture.  A  6 French arterial sheath was inserted via the modified Seldinger technique.  Preformed Judkins and a pigtail catheter were utilized.  The patient  tolerated the procedure well and left the lab in stable condition.   RESULTS:   HEMODYNAMICS:  1. LV 125/19.  2. Aortic 124/69.   CORONARIES:  The left main had distal 25% stenosis.  The LAD had proximal  80% stenosis involving the take off of small first diagonal.  Immediately  after this first diagonal prior to the first septal, there was a 99%  stenosis.  Second diagonal was moderate size and normal.  The LAD itself was  a somewhat small vessel.  The circumflex in the AV groove was a small  vessel. There appeared to be ostial 25% stenosis.  A ramus intermediate was  a small vessel with ostial 25% stenosis.  The right coronary artery was a  very large dominant vessel.  There were diffuse lumen irregularities.  There  was proximal 50% followed by 40% stenosis.  The RV branch had ostial 90%  stenosis.  There was small right to LAD collaterals.   LEFT VENTRICULOGRAM:  Left ventriculogram was obtained in the RAO  projection.  The EF was 55% with mild anterior hypokinesis.    CONCLUSION:   High grade single vessel coronary disease.   PLAN:  The patient is for percutaneous revascularization of the LAD per Dr.  Chales Abrahams.                                                [name], M.D.    JH/MEDQ  D:  09/10/2003  T:  09/10/2003  Job:  [id number]   cc:   [name], M.D.  [address] [place]  [place] [address]  Fax: [phone]

## 2011-04-10 NOTE — Assessment & Plan Note (Signed)
[facility]                              CARDIOLOGY OFFICE NOTE   Titus, Drone BULMARO FEAGANS                        MRN:          [id number]  DATE:08/11/2006                            DOB:          03-27-1930    The primary is Dr. Vernon Prey.   REASON FOR PRESENTATION:  Evaluate patient with coronary disease and recent  ER visit for chest pain.   HISTORY OF PRESENT ILLNESS:  The patient is a 77 year old gentleman.  He  presented on August 29 for evaluation of chest discomfort.  This was  atypical.  He was sent on to the emergency room and had a stress profusion  study, which demonstrated an old enteral apical scar but no ischemia.  He  has since done well.  He has been exercising.  He has been having no chest  discomfort, neck discomfort, arm discomfort, activity-induced nausea,  vomiting, or excessive diaphoresis.  He has had no palpitation, pre-syncope,  or syncope.  He has had no PND or orthopnea.   PAST MEDICAL HISTORY:  Coronary artery disease (myocardial infarction with V  fib arrest in 2004).  Left main 25% stenosis, LAD 80% stenosis, __________  99% stenosis, 25% circumflex stenosis, 25% ramus intermedius stenosis, 50-  40% right coronary artery stenosis, 90% RV branch stenosis with right to  left collaterals.  His EF was 55% with mild anterior hypokinesis (He  underwent Taxus stenting at the time).  Dyslipidemia, anxiety, benign  prostatic hypertrophy, ulcerative colitis, colonic polyps, gastrointestinal  bleeding, paroxysmal atrial fibrillation on chronic Coumadin therapy,  bilateral inguinal hernia repair, hammer toe surgery.   ALLERGIES:  SULFA AND CODEINE.   MEDICATIONS:  1. Aspirin 81 mg q. day.  2. Lipitor 40 mg q. day.  3. Asacol 40 mg 3 times a day.  4. Protonix 40 mg q. day .  5. Niaspan 500 mg q. day.  6. Multivitamin.  7. Coumadin.  8. Lasix 40 mg q. day.  9. Metoprolol 100 mg b.i.d.  10.K-Dur 20 mEq q. day.  11.Paxil 30 mg  q. day.   REVIEW OF SYSTEMS:  As stated in the HPI.  Otherwise negative for other  systems.   PHYSICAL EXAMINATION:  The patient is in no distress.  Blood pressure 124/72, heart rate 59 and regular, weight 218 pounds.  HEENT:  Eyes unremarkable.  Pupils are equal, round, and reactive to light  and accommodation.  Fundi not visualized.  Oral mucosa unremarkable.  NECK:  No jugular venous distension, waveform within normal limits, carotid  upstroke brisk and symmetric.  No bruits or thyromegaly.  LYMPHATICS:  No cervical, axillary, inguinal adenopathy.  LUNGS:  Clear to auscultation bilaterally.  BACK:  No costovertebral angle tenderness.  CHEST:  Unremarkable.  HEART:  PMI not displaced or sustained.  S1 and S2 within normal limits.  No  S3.  No S4.  No murmurs.  ABDOMEN:  Obese, positive bowel sounds, normal frequency and pitch.  No  rebound, guarding.  No midline pulsatile masses.  No hepatomegaly or  splenomegaly.  SKIN:  No rashes.  EXTREMITIES:  2+ pulses.  No edema.  No cyanosis.  No clubbing.  NEURO:  Oriented to person, place, and time.  Cranial nerves 2-12 grossly  intact.  Motor grossly intact.   EKG, sinus rhythm with sinus arrhythmia, anterior septal infarct old, axis  within normal limits, intervals within normal limits, no acute ST-T wave  changes.   ASSESSMENT AND PLAN:  1. Coronary disease.  The patient had a stress perfusion study with no      active ischemia.  No further cardiovascular testing is suggested.  He      will continue with aggressive risk reduction.  2. Dyslipidemia.  This is followed by Dr. Christell Constant.  He will continue the      medications as listed.  3. Urinary frequency.  The patient is complaining of this, therefore, I am      going to go ahead and have him stop his Lasix and take it only as      needed.  4. Followup, I was due to see him in March and I will have him keep that      appointment.                               [name], MD,  [facility]    JH/MedQ  DD:  08/11/2006  DT:  08/12/2006  Job #:  [id number]   cc:   [name], M.D.

## 2011-04-10 NOTE — Consult Note (Signed)
[name], ENDE NO.:  [id number]   MEDICAL RECORD NO.:  [id number]          PATIENT TYPE:  EMS   LOCATION:  MAJO                         FACILITY:  [facility]   PHYSICIAN:  [name], MD, FACCDATE OF BIRTH:  [birth year]   DATE OF CONSULTATION:  07/21/2006  DATE OF DISCHARGE:  07/21/2006                                   CONSULTATION   PRIMARY CARE PHYSICIAN:  [name], M.D.   REASON FOR CONSULTATION:  Patient with chest discomfort.   HISTORY OF PRESENT ILLNESS:  Patient is a pleasant 77 year old gentleman who  I know well.  He has a past history of coronary disease, as described below.  He had been doing relatively well.  I worked up him up for atypical chest  pain.  In fact, he was hospitalized in 2005.  At that time, he had a  negative stress perfusion study.  Since then, he has been able to do  activities such as walking on a treadmill and participate in cardiac rehab.  With this activity, he does not bring on any chest discomfort, neck  discomfort, arm discomfort, activity-induced nausea and vomiting with  excessive diaphoresis.  He does not have any palpitations, presyncope, or  syncope.  He denies any PND or orthopnea.   Yesterday at about 3:00 p.m., he developed some discomfort.  It seems to be  more of a left upper quadrant discomfort.  He has a difficult time  quantifying or qualifying it.  He does feel some palpitations with this.  He  woke up this morning and noticed the same discomfort, so came to the  emergency room.  His EKG is not acute.  Point-of-care markers x2 have been  unremarkable.  He has had the discomfort since he has been there.  It is  very fleeting.  It does not radiate to his sternum, jaw, or arms.  There has  been no associated nausea, vomiting, or diaphoresis.   PAST MEDICAL HISTORY:  1. Coronary artery disease (myocardial infarction with V fib arrest in      2004).  2. Cardiac catheterization, 25% left main  stenosis, 80% LAD stenosis      followed by 99% LAD stenosis, 25% circumflex stenosis, 25% ramus      intermedius stenosis, 50/40% right coronary stenosis, 90% right      ventricular branch stenosis with right to left collaterals.  His EF was      55% with mild anterior hypokinesis.  (He underwent Taxus stenting at      that time).  3. Dyslipidemia.  4. Anxiety.  5. Benign prostatic hypertrophy.  6. Ulcerative colitis.  7. Colon polyps.  8. Gastrointestinal bleed.  9. Paroxysmal atrial fibrillation, on chronic Coumadin therapy.   PAST SURGICAL HISTORY:  Bilateral inguinal hernia repair, hammer toe  surgery.   ALLERGIES:  SULFA, CODEINE.   MEDICATIONS:  1. Aspirin 81 mg daily.  2. Lipitor 40 mg a day.  3. Asacol 400 mg 3 times a day.  4. Protonix 40 mg a day.  5. Niaspan 500 mg nightly.  6. Multivitamin.  7. Coumadin 5/2.5.  8. Lasix 40 mg daily.  9. Metoprolol 100 mg b.i.d.  10.K-Dur 20 mEq daily.  11.Paxil 30 mg daily.  12.Nitroglycerin.   SOCIAL HISTORY:  Patient lives in [place] with his wife.  He is retired.  He  worked at [employer].  He has no children.  He does go to  cardiac rehab.  He does not smoke cigarettes or drink alcohol.   FAMILY HISTORY:  Noncontributory for early coronary artery disease.  There  is later onset of disease in multiple first-degree relatives.   REVIEW OF SYSTEMS:  As stated in the HPI.  Positive for anxiety, decreased  hearing, nocturia, flatus.  Negative for other systems.   PHYSICAL EXAMINATION:  GENERAL:  Patient is in no distress.  VITAL SIGNS:  Blood pressure 155/76, heart rate 59 and regular.  Afebrile.  HEENT:  Eyelids unremarkable.  Pupils are equal, round and reactive to  light.  Fundi not visualized.  Oral mucosa unremarkable.  NECK:  No jugular venous distention.  Wave form within normal limits.  Carotid upstroke brisk and symmetric.  No bruits, no thyromegaly.  LYMPHATICS:  No cervical, axillary, or inguinal  adenopathy.  LUNGS:  Clear to auscultation bilaterally.  BACK:  No costovertebral angle tenderness.  CHEST:  Unremarkable.  HEART:  PMI not displaced or sustained.  S1 and S2 within normal limits.  No  S3, no S4, no murmurs.  ABDOMEN:  Obese, positive bowel sounds.  Normal in frequency and pitch.  No  bruits, rebound, guarding, no midline pulsatile masses.  No hepatomegaly,  splenomegaly.  SKIN:  No rashes, no nodules.  EXTREMITIES:  Pulses 2+.  No cyanosis, no clubbing, no edema.  NEUROLOGIC:  Alert and oriented to person, place, and time.  Cranial nerves  II-XII grossly intact.  Motor grossly intact throughout.   LABS:  Point-of-care markers x2.  WBCs 6.2, hemoglobin 13.2.  Sodium 138,  potassium 4.1, BUN 7, INR 1.8.   EKG:  Sinus rhythm, rate 60, axis within normal limits.  Anterior myocardial  infarction, old.  No acute ST-T wave changes.   ASSESSMENT/PLAN:  1. Chest:  The patient's chest discomfort is atypical.  He has no      objective findings for ischemia.  At this point, it has been 2-1/2      years since his last stress perfusion study.  He can be discharged from      the emergency room to continue the medications as listed.  We will get      a stress perfusion in the office in two days.  Further evaluation based      on this.  2. Atrial fibrillation:  The patient is in normal sinus rhythm.  He has      had TEE, cardioversion.  He is maintained on chronic Coumadin.  I will      go ahead and give him an increased dose of Coumadin today, as his INR      is      subtherapeutic.  He will have this followed up at [facility].  3. Hypertension:  Blood pressure is well controlled.  He will continue      medications as listed.  4. Dyslipidemia:  He will continue having this followed closely by Dr.      Christell Constant.           ______________________________  [name], MD, [facility]    JH/MEDQ  D:  07/21/2006  T:  07/21/2006  Job:  [id number]   cc:    [name], M.D.  Fax: [phone]

## 2011-04-10 NOTE — Assessment & Plan Note (Signed)
[facility]                            CARDIOLOGY OFFICE NOTE   [name], [name]                        MRN:          [id number]  DATE:01/26/2007                            DOB:          1930-10-07    PRIMARY CARE PHYSICIAN:  Dr. Vernon Prey.   REASON FOR PRESENTATION:  Evaluate patient with coronary disease.   HISTORY OF PRESENT ILLNESS:  The patient returns for followup.  He has  done quite well since I last saw him.  He has had no further chest  discomfort, neck or arm discomfort.  He has had no palpitation,  presyncope or syncope.  He has had no shortness of breath.  He is  working in the maintenance phase of cardiac rehab at the [facility].   PAST MEDICAL HISTORY:  1. Coronary artery disease (see the August 11, 2006 note for      details), well-preserved ejection fraction (EF of 55% with mild      anterior hypokinesis).  2. Dyslipidemia.  3. Anxiety.  4. Benign prostatic hypertrophy.  5. Ulcerative colitis.  6. Colon polyps.  7. Gastrointestinal bleeding.  8. Paroxysmal atrial fibrillation, on chronic Coumadin therapy.  9. Bilateral inguinal hernia repair.  10.Hammertoe surgery.   ALLERGIES:  SULFA and CODEINE.   MEDICATIONS:  1. Lipitor 40 mg daily.  2. Paxil 30 mg daily.  3. Aspirin 81 mg daily.  4. Protonix 40 mg daily.  5. Niaspan 500 mg daily.  6. Senior vitamin.  7. Coumadin.  8. Metoprolol 100 mg b.i.d.  9. Asacol 1600 mg t.i.d.  10.Potassium 10 mEq daily.   REVIEW OF SYSTEMS:  As started in the HPI and otherwise negative for  other systems.   PHYSICAL EXAMINATION:  GENERAL:  The patient is in no distress.  VITAL SIGNS:  Blood pressure 122/70, heart rate 66 and regular, weight  216 pounds.  NECK:  No jugular venous distention, wave form within normal limits,  carotid upstroke brisk and symmetric, no bruits, no thyromegaly.  LYMPHATICS:  No cervical, axillary or inguinal adenopathy.  LUNGS:  Clear to auscultation  bilaterally.  BREASTS:  No costovertebral angle tenderness.  CHEST:  Unremarkable.  HEART:  PMI not displaced or sustained, S1 and S2 within normal limits,  no S3, no S4, no clicks, no rubs, no murmurs.  ABDOMEN:  Obese.  Positive bowel sounds, normal in frequency and pitch.  No bruits, no rebound, no guarding, no midline pulsatile mass or  organomegaly.  SKIN:  No rashes or nodules.  EXTREMITIES:  Pulses 2+.  No edema.  NEUROLOGIC:  Grossly intact.   EKG:  Sinus rhythm, rate 66.  Anterior myocardial infarction, old.   ASSESSMENT AND PLAN:  1. Coronary disease.  The patient is having no symptoms.  He is      participating in the secondary risk reduction.  He is participating      in cardiac rehabilitation.  No further cardiovascular testing is      suggested.  2. Obesity.  He is slowly losing a little weight and I encouraged more  of the same.  3. Atrial fibrillation.  He continues to have some palpitations which      we must consider are paroxysms of atrial fibrillation and so he      will continue on the Coumadin.  He is having no problems with this      and is being followed at [facility].  4. Followup:  He prefers to be seen every 6 months and we will arrange      this.     [name], MD, [facility]  Electronically Signed    JH/MedQ  DD: 01/26/2007  DT: 01/26/2007  Job #: [id number]   cc:   [name], M.D.

## 2011-04-10 NOTE — Discharge Summary (Signed)
NAME:  [name], [name] NO.:  [id number]   MEDICAL RECORD NO.:  [id number]                   PATIENT TYPE:  OIB   LOCATION:  6529                                 FACILITY:  [facility]   PHYSICIAN:  [name], M.D.                DATE OF BIRTH:  20-Jun-1930   DATE OF ADMISSION:  09/10/2003  DATE OF DISCHARGE:  09/11/2003                           DISCHARGE SUMMARY - REFERRING   DISCHARGE DIAGNOSES:  1. Coronary artery disease status post Stent placement into the mid left     anterior descending lesion.  2. History of out of hospital cardiac arrest with successful defibrillation     cardiopulmonary resuscitation. At that point, he did rule in for     myocardial infarction. At that point, he did not have cardiac     catheterization because of hypoxic encephalopathy. But ejection fraction     was 55% to 65%.  3. Diverticulitis.  4. Colitis.  5. Mild benign prostatic hypertrophy.  6. Hyperlipidemia, treated.   ALLERGIES:  SULFA, ASPIRIN, CODEINE AND NONSTEROIDALS (He is taking aspirin  during this hospitalization. I suspect this is secondary to his colitis).   HOSPITAL COURSE:  Mr. Tillis is a 77 year old male patient who had non-Q  wave myocardial infarction and a V-fib arrest. He had no workup for coronary  obstruction secondary to his metabolic encephalopathy. At this point, Dr.  Antoine Poche felt that it was significant for the patient to have a Cardiolite  study to try to outline whether or not there was underlying ischemia.  Apparently this test was positive with ischemia and he was brought to the  cardiac catheterization lab on September 10, 2003. He was found to have a 95%  LAD lesion that underwent Taxus stent placement, resulting in a 0% post  procedure with TIMI-3 flow. At this point, the patient recovered nicely  overnight. He was ready for discharge to home the next morning. He is being  discharged to home on the following medications.   DISCHARGE MEDICATIONS:  1. Lipitor 40 mg q.h.s.  2. Paxil 30 mg a day.  3. Metoprolol 50 mg 1/2 tablet b.i.d.  4. Lisinopril 5 mg a day.  5. Plavix 75 mg a day.  6. Asacol 400 mg three times a day.  7. Aspirin 325 mg a day.  8. Plavix 75 mg a day.  9. Risperdal as prior to admission.  10.      He is to stop his Zocor and Imdur.  11.      He may utilize his Nitroglycerin p.r.n. chest pain.   ACTIVITY:  No strenuous activity. No heavy lifting over 10 pounds. No  driving for 2 days. He can gradually increase activity. He does need to  participate in cardiac rehabilitation.   DIET:  Remain on a low fat diet.    WOUND CARE:  Clean over catheterization site with soap and water.  No  scrubbing. Offered questions or concerns.   ACTIVITY:  He has an appointment with Dr. Antoine Poche at the [facility] on  October 03, 2003 at 2:15 p.m.      Guy Franco, P.A. [facility]                      [name], M.D.    LB/MEDQ  D:  09/11/2003  T:  09/11/2003  Job:  [id number]   cc:   [name], M.D.

## 2011-04-10 NOTE — Discharge Summary (Signed)
NAME:  [name], [name] NO.:  [id number]   MEDICAL RECORD NO.:  [id number]                   PATIENT TYPE:  INP   LOCATION:  2003                                 FACILITY:  [facility]   PHYSICIAN:  Learta Codding, M.D.                 DATE OF BIRTH:  07/30/1930   DATE OF ADMISSION:  07/29/2003  DATE OF DISCHARGE:  08/16/2003                                 DISCHARGE SUMMARY   PROCEDURES:  1. Mechanical ventilation x48 hours.  2. Fiberoptic endoscopic evaluation of swallowing.  3. Modified barium swallow.   HOSPITAL COURSE:  [name] is a 77 year old male with no known history of  coronary artery disease.  He had an out of hospital cardiac arrest and EMS  responded to the scene.  He had CPR performed and his initial rhythm was  ventricular fibrillation.  He was defibrillated twice and then went into  atrial fibrillation, then went into sinus rhythm.  His ventilations were  assisted.  He was eventually intubated.  He was transported to [facility] as the closest facility and then transferred to [facility] for further evaluation and treatment.   PROBLEM #1 -  RESPIRATORY:  [name] was on the ventilator but his  respiratory status continued to improve and he was extubated on July 30, 2003.  He did not require reintubation.  His respiratory status remained  stable and by the time of discharge, his O2 saturation was 91% on room air.   PROBLEM #2 -  NON-Q-WAVE MYOCARDIAL INFARCTION:  [name] ruled in for an  MI and his CK-MB peaked at 1569/52.8.  After that, it trended down and the  MB fraction was normalized by August 06, 2003.  He was not initially  taken to the catheterization lab because of medical instability and ongoing  fevers.  As he medically stabilized, however, his mental status worsened  with episodes of agitation. It was felt that he was not stable from a mental  standpoint to cooperate during cardiac  catheterization and this was  deferred.  He continued to improve, however, and was ambulating without  chest pain or shortness of breath.  Further cardiac evaluation is deferred  at this time and he can be reevaluated in the office as an outpatient.  At  that time, depending on his condition, either a stress test or cardiac  catheterization can be considered.   PROBLEM #3 -  HYPOXIC ENCEPHALOPATHY:  [name] initially had a neurology  consult called for evaluation and was seen by [name].  He had mild small  vessel disease by CT and was followed closely by neurology during his  hospital stay.  They felt that he had anoxic encephalopathy but [name]  felt that his prognosis should be fairly good.  Although he might have short  term memory loss.  Final  cognitive testing could be done in four to six  weeks.  [name] felt that the patient would likely have some speech  therapy defects that are persistent.  He felt that his agitation was getting  some better.   PROBLEM #4 -  SPEECH EVALUATION:  Because of some difficulties with  swallowing, he had a speech evaluation.  Initially he was placed on a  restricted diet but he was reevaluated by the speech therapy team and had an  FEES as well as a modified barium swallow.  At the last evaluation which was  performed on August 14, 2003, he had no signs or symptoms of aspiration.  A cognitive evaluation is also to be performed by the speech therapy team  but the results of that are not available at the time of this dictation.  It  was felt that he needed intermittent supervision of p.o.'s and he should  continue diet and aspiration precautions.   PROBLEM #5 -  FEVERS:  [name] had intermittent fevers up to 101 during  his hospital stay.  He had cultures performed but these were negative.  He  had urine cultures performed x2 and these were negative as well.  Because of  some diarrhea, he had a C. difficile performed on his stool  which was  negative.  He also had a stool culture which showed no Salmonella Shigella,  Campylobacter, or uricemia.  His symptoms resolved and he was having no  problems with diarrhea and no urinary problems and no fevers by the time of  discharge.   PROBLEM #6 -  DYSLIPIDEMIA:  As part of his cardiac evaluation, he had a  lipid profile performed.  It showed a total cholesterol of 126,  triglycerides 101, HDL 50, and LDL 56.  A TSH was also performed which was  within normal limits at 1.01.  He was continued on his prior dose of  Synthroid.  He had been on Zocor at 80 mg daily prior to admission and this  will be continued.   PROBLEM #7 -  HYPERGLYCEMIA:  [name] had elevated blood sugars during  his hospital stay with a peak of 162 once he was back on p.o. intake.  A  hemoglobin A1C was checked and was slightly elevated at 6.3%.  He will have  his diet changed to a diabetic diet but no medical therapy is planned at  this time.   PROBLEM #8 -  ANEMIA:  [name] was anemic during his hospital stay with a  hemoglobin that dropped to a low of 8.2.  A GI consult was called and he was  seen by [name].  [name] felt that empiric proton pump inhibitors were  indicated.  He also felt that his Asacol should be restarted as he started  taking p.o.'s again which he had been taking secondary to a history of  ulcerative colitis.  If his hemoglobin and hematocrit continue to drop, Dr.  Marina Goodell recommended a CT to rule out a retroperitoneal bleed but had no plans  for endoscopy unless an acute bleed developed.  Of note, in prior history  was a colonoscopy which had been performed on January 25, 2003, and it showed  erythema present and some bleeding present with mild friability.  The  diagnosis was colitis and the patient is to get a routine colonoscopy every  two years.  [name] had heme-positive stools x2 but his hemoglobin and hematocrit improved with a hemoglobin of 9.8  and hematocrit of  29.4 at last  check.  A repeat is pending at the time of dictation.  He is to have an iron  profile once he gets to [place] and results can be followed there.   PROBLEM #9 -  ARRHYTHMIA:  Mr. Mathia's presenting rhythm was ventricular  fibrillation.  He was in atrial fibrillation and then in sinus rhythm.  During his hospital stay, however, he had episodes of PAF.  These were brief  and he was asymptomatic.  He had been started on a low dose Coreg for beta-  blocker and this was not up titrated because his systolic blood pressure was  frequently between 90 and 100.  His medications can be further adjusted as  an outpatient.   PROBLEM #10 -  SOCIAL ISSUES:  Because of his agitation, Mr. Najarian was  requiring 24-7 supervision.  Situation was discussed with case manager and  his wife as well as the patient.  His wife has no consistent 24-hour help  and he was also having some problems with balance.  It was felt that he  would benefit from further rehab in an inpatient stay to allow his  neurologic status to recover.  He was not felt appropriate [facility] so  arrangements were made to transfer him to a skilled nursing facility.  He is  to get physical therapy and occupational therapy there.  Any recommendations  for further therapies based on cognitive evaluation are pending at the time  of dictation.  Mr. and Mrs. Smyers accepted the bed at [facility] and Mr. Schurman is scheduled to be transferred there on August 16, 2003.   LABORATORY DATA:  CBC pending at the time of dictation.   Chest x-ray with resolving bilateral pulmonary edema.   CONDITION ON DISCHARGE:  Improved.   DISCHARGE DIAGNOSES:  1. Ventricular fibrillation cardiac arrest.  2. Myocardial infarction.  3. Left ventricular dysfunction with an ejection fraction of 40% by echo.  4. Fevers with negative blood cultures, resolving on Cipro.  5. Ventricular fibrillation.  6. Atrial fibrillation/paroxysmal atrial  fibrillation.  7. Congestive heart failure.  8. Anoxic encephalopathy with residual agitation.  9. Possible aspiration pneumonia.  10.      History of hyperlipidemia.  11.      History of ulcerative colitis.  12.      History of gastrointestinal bleed secondary to peptic ulcer.  13.      History of anxiety.  14.      Family history of coronary artery disease and sudden death.  15.      Intolerance to codeine.   DISCHARGE INSTRUCTIONS:  1. His activity level is to include PT and OT five times a week.  2. He is to get a cognitive assessment p.r.n. secondary to confusion.  3. He is to be on a regular ADA modified diet with aspiration precautions.  4. He is to get an iron profile drawn and follow up with that with the     physician at [facility].  5. He is to follow up with cardiology and neurology in four to six weeks.   DISCHARGE MEDICATIONS:  1. Aspirin 325 mg daily.  2. Imdur 30 mg daily.  3. Lopressor 100 mg b.i.d. 4. Prinivil 5 mg b.i.d.  5. Protonix 40 mg daily.  6. Risperdal 2 mg q.h.s.  7. Zocor 80 mg daily.  8. Imodium 2 mg p.r.n.  9. Ventolin 2.5 mg p.r.n.  10.  Paxil 20 mg one and a half tablets daily.  11.      Tranxene p.r.n.  12.      Asacol 400 mg four tablets q.a.c.  and one tablet q.h.s.  13.      Caltrate daily.      Lavella Hammock, P.A. [facility]                  Learta Codding, M.D.    RG/MEDQ  D:  08/16/2003  T:  08/16/2003  Job:  [id number]   cc:   [name], M.D.  [address] [place]  [place] [address]  Fax: [phone]   [facility]  [place], [place]   Dr. Carolann Littler, M.D.  [address]  [address]  [place]  [place] [address]  Fax: [phone]

## 2011-04-10 NOTE — Consult Note (Signed)
NAMEUSMAN, MILLETT NO.:  [id number]   MEDICAL RECORD NO.:  [id number]          PATIENT TYPE:  EMS   LOCATION:  MAJO                         FACILITY:  [facility]   PHYSICIAN:  [name], MD, FACCDATE OF BIRTH:  1930-11-11   DATE OF CONSULTATION:  07/21/2006  DATE OF DISCHARGE:                                   CONSULTATION   PRIMARY CARE PHYSICIAN:  [name], M.D.   REASON FOR PRESENTATION:  Evaluate patient with chest discomfort and  coronary disease.   HISTORY OF PRESENT ILLNESS:  The patient is a 77 year old gentleman well  known to me.  He has coronary disease, as described below.  He has been  doing relatively well.  I have evaluated him since his myocardial infarction  in 2004 for atypical chest pain.  He last had a stress perfusion study in  January 2005.  This was negative for any evidence of ischemia.   Dictation ended at this point.           ______________________________  [name], MD, [facility]     JH/MEDQ  D:  07/21/2006  T:  07/21/2006  Job:  [id number]

## 2011-04-10 NOTE — Cardiovascular Report (Signed)
NAME:  [name], TOME NO.:  [id number]   MEDICAL RECORD NO.:  [id number]                   PATIENT TYPE:  OIB   LOCATION:  2899                                 FACILITY:  [facility]   PHYSICIAN:  Veneda Melter, M.D.                   DATE OF BIRTH:  12-12-1929   DATE OF PROCEDURE:  09/10/2003  DATE OF DISCHARGE:                              CARDIAC CATHETERIZATION   PROCEDURES PERFORMED:  Percutaneous transluminal coronary angioplasty and  stent placement in the mid LAD.   DIAGNOSIS:  Coronary atherosclerotic disease.   HISTORY:  [name] is a 77 year old gentleman who had a recent admission  to [facility] for myocardial infarction.  The patient was severely  agitated and had significant co-morbidities preventing further cardiac  assessment at that time.  He has been stabilized medically and underwent  cardiac catheterization earlier today by Dr. Antoine Poche showing severe  narrowing in the mid LAD with corresponding wall motion abnormality.  He is  referred for further assessment.   TECHNIQUE:  Informed consent was obtained.  The patient brought to the  catheterization lab.  An existing 6 French sheath in the right groin was  exchanged for 7 [place] sheath.  The patient was given heparin and Integrelin  on a weight adjusted basis to maintain ACT greater than 250 seconds.  In  addition, he was given aspirin and Plavix.  A 7 [place] CLS-4 guide catheter  was used to engage the left coronary artery and selective guide shots  obtained.  This confirmed the presence of moderate amount of disease in the  proximal LAD of 50%.  There was then severe narrowing of 95% after the first  diagonal branch and just prior to the take off of a septal perforator.  The  distal LAD has mild irregularities as did the first and second diagonal  branches.  A 0.014-inch Asahi Prowater wire was then advanced in the distal  LAD and a 3.0 x 12-mm Voyager balloon was introduced.   This was used to  predilate the lesion at 6 atmospheres for 30 seconds.  Repeat angiography  showed modest improvement of vessel lumen with residual 70% narrowing.  A  3.5 x 16-mm Taxus Express 2 stent was introduced and carefully positioned in  the mid LAD and deployed at 12 atmospheres for 60 seconds.  A 3.5 x 12-mm  Quantum Maverick balloon was then used to post dilate the stent.  Two  inflations were performed in the distal and proximal segments at 14 and 16  atmospheres respectively for 30 seconds each.  Repeat angiography was then  performed after administration of intracoronary nitroglycerin showing  excellent result with no residual stenosis and TIMI-3 flow through the LAD.  There was no distal vessel damage.  There did not appear to be compromise of  the first diagonal branch or the septal perforator which had a  70% narrowing  at its ostium prior to and following intervention.  Final angiography was  performed in various projections confirming these findings.  The guide  catheter was then removed.  The sheath secured into position.  The patient  tolerated the procedure well and was transferred to the floor in stable  condition.    FINAL RESULT:  Successful percutaneous transluminal coronary angioplasty and  stent placement in the mid left anterior descending with reduction of 95%  narrowing to 0% with placement of 3.5 x 16-mm Taxus Express 2 stent.                                                  Veneda Melter, M.D.    NG/MEDQ  D:  09/10/2003  T:  09/10/2003  Job:  [id number]   cc:   [name], M.D.  [address] [place]  [place] [address]  Fax: [phone]

## 2011-04-10 NOTE — Consult Note (Signed)
NAMEMOO, GRAVLEY NO.:  [id number]   MEDICAL RECORD NO.:  [id number]          PATIENT TYPE:  EMS   LOCATION:  MAJO                         FACILITY:  [facility]   PHYSICIAN:  [name], MD, FACCDATE OF BIRTH:  02-16-1930   DATE OF CONSULTATION:  DATE OF DISCHARGE:  07/21/2006                                   CONSULTATION   Audio too short to transcribe (less than 5 seconds)           ______________________________  [name], MD, [facility]     JH/MEDQ  D:  07/21/2006  T:  07/21/2006  Job:  [id number]

## 2011-04-10 NOTE — Consult Note (Signed)
NAME:  [name], [name] NO.:  [id number]   MEDICAL RECORD NO.:  [id number]                   PATIENT TYPE:  INP   LOCATION:  2921                                 FACILITY:  [facility]   PHYSICIAN:  Marlan Palau, M.D.               DATE OF BIRTH:  1930/03/25   DATE OF CONSULTATION:  07/30/2003  DATE OF DISCHARGE:                                   CONSULTATION   HISTORY OF PRESENT ILLNESS:  [name] is a 77 year old right-handed  white male born October 21, 2000, with a history of possible atrial  fibrillation in the past.  The patient presented to the [facility]  on July 29, 2003, following a cardiac arrest.  The patient had CPR  apparently for about 20 minutes before he was brought around.  The patient  was in ventricular fibrillation requiring cardioversion.  The patient was  intubated, initially was unresponsive sedated with Versed.  The patient was  transferred from [facility] to [facility].  The patient  currently, however, is beginning to awaken, is tracking with his eyes, seems  to recognize finger numbers, will follow some commands, squeeze [place] with his  hands.  Neurology was asked to see the patient for further evaluation.  A CT  scan of the brain done on admission showed some small vessel ischemic  changes, no diffuse edema was noted.  The patient had subcortical infarction  in the left frontal and right parietal areas, of indeterminate age.   PAST MEDICAL HISTORY:  Significant for:  1. Status post cardiac arrest.  2. Possible atrial fibrillation in the past.  3. Ulcerative colitis.  4. Peptic ulcer disease and GI bleed.  5. History of increased lipids.  6. Mild obesity.  7. Hernia surgery.   ALLERGIES:  The patient has allergies to CODEINE that causes NAUSEA.   HABITS:  He does not currently smoke or drink.   SOCIAL HISTORY:  The patient is married to his second wife.  He has one  daughter by his  first wife, who is alive and well.  The patient is retired  and lives in the [place] area.   FAMILY HISTORY:  His mother passed away of unknown cause.  Father died of  MI.  The patient had two brothers who died from MIs; another two brothers  died with organic heart disease and congestive heart failure; and has one  sister, who is alive, and who has a history of hypercholesterolemia and  diabetes.   REVIEW OF SYSTEMS:  Cannot be obtained as the patient is intubated and  somewhat drowsy at the time of the examination.   PHYSICAL EXAMINATION:  VITAL SIGNS:  Blood pressure is approximately 112/52,  heart rate 70, respiratory rate 14 per ventilator.  GENERAL:  This patient is a moderately-obese white male who is intubated at  the time of examination.  HEENT:  The patient has atraumatic eyes, pupils equal, round, and reactive  to light, disks are flat bilaterally.  NECK:  Supple, no carotid bruits.  RESPIRATORY:  Relative clear.  CARDIOVASCULAR:  Reveals distant heart sounds, no obvious murmurs or rubs  noted.  EXTREMITIES:  Without significant edema.  NEUROLOGIC:  Cranial nerves as above.  Facial symmetry is present.  The  patient is able to smile slightly in a __________ fashion, seems to blink to  threat bilaterally, will track the examiner with eyes, has full lateral  movement.  The patient has minimal response to deep pain and stimulation of  the lower extremities.  He seems to respond with upper extremities.  The  patient will grip with both upper extremities fairly well to command.  He  does not seem to follow pure verbal commands well, however.  The patient has  depressed, but symmetric reflexes and all toes neutral bilaterally.  Cerebellar testing could not be performed.  The patient could not be  ambulated.   LABORATORY DATA:  As before, sodium 136, potassium 3.3, chloride of 105, CO2  of 24, glucose 153, BUN 15, creatinine 1.1, calcium 8.0.  Total protein 6.1,  albumin 3.2,  AST of 187, ALT of 37, alkaline phosphatase 54, total bilirubin  0.8.  White count 10.7, hemoglobin 13.0, hematocrit 38.6, MCV 92.8.  EKG  reveals normal sinus rhythm, anteroseptal infarct, age undetermined, heart  rate of 65.   IMPRESSION:  1. Status post cardiac arrest.  2. Possible mild anoxic injury and mild small vessel disease by CT.   This patient seems to be doing quite well following his cardiac arrest.  The  patient is clearly awake, seems to recognize pain and numbers, follows some  commands, and tracks with his eyes.  The neuron-associative memory function  are the most sensitive anoxic injury, but this aspect of his mental status  cannot be tested at this time.  This patient, however, seems to have a very  reasonable chance for good recovery and we will follow the patient while in  the hospital.                                               C. Lesia Sago, M.D.    CKW/MEDQ  D:  07/30/2003  T:  07/30/2003  Job:  [id number]   cc:   Regina Eck, M.D.

## 2011-04-10 NOTE — Assessment & Plan Note (Signed)
[facility]                         GASTROENTEROLOGY OFFICE NOTE   NAME:[name], Gregory REZA                        MRN:          [id number]  DATE:03/16/2007                            DOB:          Jun 26, 1930    [name] comes in and says he wants to use Osmo prep and not the movie prep  for his colonoscopic examination. He is scheduled for Apr 19, 2007. I  told him that he needs to stop his Coumadin if it is okay with his  physician. He is willing to go ahead with the Osmo prep, he just does  not like drinking lots of fluids. He is scheduled for the procedure. We  had a long discussion about it. He has had it before and I think he is  going to be fine with it.   His other medications include:  1. Asacol.  2. Lipitor.  3. Paxil.  4. Protonix.  5. Niaspan.  6. Vitamins.  7. Coumadin.  8. Furosemide.  9. Aspirin.  10.Metoprolol.  11.Potassium.  12.As well as Nitro Quick.     Gregory Mort, MD  Electronically Signed    SML/MedQ  DD: 03/16/2007  DT: 03/16/2007  Job #: [id number]   cc:   [name], M.D.

## 2011-04-10 NOTE — Discharge Summary (Signed)
   NAME:  Gregory, [name] NO.:  [id number]   MEDICAL RECORD NO.:  [id number]                   PATIENT TYPE:  INP   LOCATION:  2003                                 FACILITY:  [facility]   PHYSICIAN:  Learta Codding, M.D.                 DATE OF BIRTH:  Apr 20, 1930   DATE OF ADMISSION:  07/29/2003  DATE OF DISCHARGE:  08/16/2003                                 DISCHARGE SUMMARY   PROCEDURES:  1. Mechanical ventilation and intubation.  2. Speech evaluation.  3. Fiberoptic endoscopic evaluation of the swallow.  4. Modified barium swallow.   HOSPITAL COURSE:  [name] is a 77 year old male with no known history of  coronary artery disease.  He had an out-of-hospital cardiac arrest and had  CPR and ventilation started by EMS.  He was transported to [facility].  There was concern for his problems being secondary to MI and he  was transported to [facility] for further evaluation and treatment.   PROBLEM #1 - RESPIRATORY:  [name] was intubated and on a ventilator and  this was managed by   DICTATION ENDED AT THIS POINT.      Lavella Hammock, P.A. [facility]                  Learta Codding, M.D.    RG/MEDQ  D:  08/16/2003  T:  08/16/2003  Job:  [id number]

## 2011-04-22 ENCOUNTER — Ambulatory Visit (INDEPENDENT_AMBULATORY_CARE_PROVIDER_SITE_OTHER): Payer: Medicare Other | Admitting: Cardiology

## 2011-04-22 ENCOUNTER — Encounter: Payer: Self-pay | Admitting: Cardiology

## 2011-04-22 DIAGNOSIS — I2510 Atherosclerotic heart disease of native coronary artery without angina pectoris: Secondary | ICD-10-CM

## 2011-04-22 DIAGNOSIS — I4891 Unspecified atrial fibrillation: Secondary | ICD-10-CM

## 2011-04-22 DIAGNOSIS — I480 Paroxysmal atrial fibrillation: Secondary | ICD-10-CM

## 2011-04-22 NOTE — Progress Notes (Signed)
HPI The patient returns as an add on for evaluation of possible fluid retention. He had a cold recently and very vague symptoms that he cannot qualify or quantify. He describes a feeling like he was retaining fluid but he was not particularly short of breath. He didn't have weight gain or edema. He wasn't describing PND or orthopnea. He had no new palpitations, presyncope or syncope. He had no chest pain. He was taking some cold medicines at that time and thought this might have contributed.  Allergies  Allergen Reactions  . Asa-Apap-Salicyl-Caff     Takes warfarin  . Codeine   . Nsaids Other (See Comments)    On warfarin  . Sulfonamide Derivatives     Current Outpatient Prescriptions  Medication Sig Dispense Refill  . aspirin 81 MG tablet Take 81 mg by mouth daily.        Marland Kitchen atorvastatin (LIPITOR) 40 MG tablet Take 40 mg by mouth at bedtime.        . calcium citrate-vitamin D (CITRACAL+D) 315-200 MG-UNIT per tablet Take 1 tablet by mouth.        . Cholecalciferol (VITAMIN D) 1000 UNITS capsule Take 1,000 Units by mouth daily.        Marland Kitchen desogestrel-ethinyl estradiol (APRI) 0.15-30 MG-MCG per tablet Take 1 tablet by mouth. 4 tabs in am       . digoxin (LANOXIN) 0.125 MG tablet Take 125 mcg by mouth daily.        . fish oil-omega-3 fatty acids 1000 MG capsule Take 2 g by mouth daily.        . furosemide (LASIX) 40 MG tablet Take 40 mg by mouth daily. If needed for fld       . glipiZIDE-metformin (METAGLIP) 2.5-500 MG per tablet Take 1 tablet by mouth daily.        . metoprolol (LOPRESSOR) 100 MG tablet Take 100 mg by mouth. One and one half qd       . niacin (NIASPAN) 500 MG CR tablet Take 500 mg by mouth at bedtime.        . nitroGLYCERIN (NITROSTAT) 0.3 MG SL tablet Place 0.3 mg under the tongue every 5 (five) minutes as needed. Never had to use       . omeprazole (PRILOSEC) 20 MG capsule Take 20 mg by mouth daily.        Marland Kitchen PARoxetine (PAXIL) 20 MG tablet Take 20 mg by mouth. One tab to one  half  qd       . potassium chloride SA (K-DUR,KLOR-CON) 20 MEQ tablet 20 mEq. One half qd      . warfarin (COUMADIN) 5 MG tablet Take 5 mg by mouth daily.          Past Medical History  Diagnosis Date  . CAD (coronary artery disease)     Anterior MI and DES stenting 2004, Vfib arrest  . PAF (paroxysmal atrial fibrillation)   . Dyslipidemia   . Anxiety   . GIB (gastrointestinal bleeding)   . Obesity   . UC (ulcerative colitis confined to rectum)     Past Surgical History  Procedure Date  . Hammer toe surgery   . Inguinal hernia repair     ROS: As stated in the HPI and negative for all other systems.  PHYSICAL EXAM BP 122/86  Pulse 99  Resp 16  Ht 5\' 11"  (1.803 m)  Wt 199 lb (90.266 kg)  BMI 27.75 kg/m2 GENERAL:  Well appearing HEENT:  Pupils  equal round and reactive, fundi not visualized, oral mucosa unremarkable NECK:  No jugular venous distention, waveform within normal limits, carotid upstroke brisk and symmetric, no bruits, no thyromegaly LYMPHATICS:  No cervical, inguinal adenopathy LUNGS:  Clear to auscultation bilaterally BACK:  No CVA tenderness CHEST:  Unremarkable HEART:  PMI not displaced or sustained,S1 and S2 within normal limits, no S3, no clicks, no rubs, no murmurs, irregular ABD:  Flat, positive bowel sounds normal in frequency in pitch, no bruits, no rebound, no guarding, no midline pulsatile mass, no hepatomegaly, no splenomegaly EXT:  2 plus pulses throughout, no edema, no cyanosis no clubbing SKIN:  No rashes no nodules NEURO:  Cranial nerves II through XII grossly intact, motor grossly intact throughout, resting tremor PSYCH:  Cognitively intact, oriented to person place and time  ASSESSMENT AND PLAN

## 2011-04-22 NOTE — Assessment & Plan Note (Signed)
He tolerates rate control and anticoagulation. 

## 2011-04-22 NOTE — Assessment & Plan Note (Signed)
He has had a recent negative stress test with an EF of 40%.  I see nothing acute today to explain his recent vague symptoms of fluid retention.  He had a CXR with his primary.  No further testing or change in therapy is indicated,

## 2011-04-22 NOTE — Patient Instructions (Signed)
Continue current medications Follow up as scheduled in August in the [place] office

## 2011-05-18 ENCOUNTER — Other Ambulatory Visit (INDEPENDENT_AMBULATORY_CARE_PROVIDER_SITE_OTHER): Payer: Medicare Other

## 2011-05-18 ENCOUNTER — Ambulatory Visit (INDEPENDENT_AMBULATORY_CARE_PROVIDER_SITE_OTHER): Payer: Medicare Other | Admitting: Gastroenterology

## 2011-05-18 ENCOUNTER — Encounter: Payer: Self-pay | Admitting: Gastroenterology

## 2011-05-18 VITALS — BP 112/70 | HR 100 | Ht 69.0 in | Wt 205.0 lb

## 2011-05-18 DIAGNOSIS — K219 Gastro-esophageal reflux disease without esophagitis: Secondary | ICD-10-CM

## 2011-05-18 DIAGNOSIS — Z7901 Long term (current) use of anticoagulants: Secondary | ICD-10-CM

## 2011-05-18 DIAGNOSIS — K5150 Left sided colitis without complications: Secondary | ICD-10-CM

## 2011-05-18 LAB — BASIC METABOLIC PANEL
CO2: 33 mEq/L — ABNORMAL HIGH (ref 19–32)
Calcium: 8.2 mg/dL — ABNORMAL LOW (ref 8.4–10.5)
Chloride: 96 mEq/L (ref 96–112)
Potassium: 4 mEq/L (ref 3.5–5.1)
Sodium: 134 mEq/L — ABNORMAL LOW (ref 135–145)

## 2011-05-18 MED ORDER — MESALAMINE ER 0.375 G PO CP24: 375.0000 mg | ORAL_CAPSULE | Freq: Every day | ORAL | Status: DC

## 2011-05-18 MED ORDER — PEG-KCL-NACL-NASULF-NA ASC-C 100 G PO SOLR: 1.0000 | Freq: Once | ORAL | Status: DC

## 2011-05-18 NOTE — Patient Instructions (Signed)
Go directly to the basement to have your labs drawn today.  You have been scheduled for a Colonoscopy. Separate instructions given. Your prescriptions have been sent to your pharmacy and samples given of Apriso.  cc: [name], MD

## 2011-05-18 NOTE — Progress Notes (Signed)
History of Present Illness: This is a 77 year old male with a long history of ulcerative colitis. His symptoms are under excellent control. His last colonoscopy was in 02/2009 and showed no evidence of colitis or dysplasia. Has chronic GERD which is well controlled on daily omeprazole. Upper endoscopy in 02/2009 was unremarkable. He is maintained on Coumadin anticoagulation for history of paroxysmal atrial fibrillation and also has coronary disease and a coronary artery stent. He denies melena, hematochezia, abdominal pain, change in bowel, dysphagia, nausea, vomiting, chest pain   Current Medications, Allergies, Past Medical History, Past Surgical History, Family History and Social History were reviewed in [employer] record.  Physical Exam: General: Well developed , well nourished, no acute distress Head: Normocephalic and atraumatic Eyes:  sclerae anicteric, EOMI Ears: Normal auditory acuity Mouth: No deformity or lesions Lungs: Clear throughout to auscultation Heart: Regular rate and rhythm; no murmurs, rubs or bruits Abdomen: Soft, non tender and non distended. No masses, hepatosplenomegaly or hernias noted. Normal Bowel sounds Rectal: Deferred to colonoscopy  Musculoskeletal: Symmetrical with no gross deformities  Pulses:  Normal pulses noted Extremities: No clubbing, cyanosis, edema or deformities noted Neurological: Alert oriented x 4, grossly nonfocal Psychological:  Alert and cooperative. Normal mood and affect  Assessment and Recommendations:  1. Long-standing left-sided ulcerative colitis. Continue Apriso at the current dosage. His most recent labs in 07/2010 show a BUN of 11 and Cr of 1.07. The patient wishes to use OsmoPrep again. He states he simply cannot tolerate the liquid preps. He understands the risk of kidney damage and possibly even kidney failure and the need for dialysis. He is advised to push a large amount of fluids including water and juices on  the day of the bowel prep, with the bowel prep and then after the procedure to minimize potential for kidney damage. BMET today. The risks, benefits, and alternatives to colonoscopy with possible biopsy and possible polypectomy were discussed with the patient and they consent to proceed.   2. Coumadin anticoagulation. The risks benefits and alternatives to hold her Coumadin to allow for biopsy and polypectomy if indicated were discussed with the patient he consents to proceed. Obtain clearance from Dr. Antoine Poche. 3. GERD. Continue omeprazole 20 mg daily and standard antireflux diet.

## 2011-05-19 ENCOUNTER — Telehealth: Payer: Self-pay | Admitting: Gastroenterology

## 2011-05-19 NOTE — Telephone Encounter (Signed)
Called [facility] pharmacy and spoke with [facility] and informed her that I sent in the wrong prep yesterday. Told her to change the prep to Osmoprep instead of Movi prep. Lelon Mast states she will fix the mistake. Called patient and informed the wife that we sent in the wrong prep but corrected it with the pharmacy and to call us back if they have any more problems. Pt agreed and verbalized understanding.

## 2011-05-21 ENCOUNTER — Encounter: Payer: Self-pay | Admitting: Gastroenterology

## 2011-06-25 ENCOUNTER — Encounter: Payer: Self-pay | Admitting: Cardiology

## 2011-07-06 ENCOUNTER — Encounter: Payer: Self-pay | Admitting: Cardiology

## 2011-07-08 ENCOUNTER — Telehealth: Payer: Self-pay

## 2011-07-08 ENCOUNTER — Encounter: Payer: Self-pay | Admitting: Cardiology

## 2011-07-08 ENCOUNTER — Ambulatory Visit (INDEPENDENT_AMBULATORY_CARE_PROVIDER_SITE_OTHER): Payer: Medicare Other | Admitting: Cardiology

## 2011-07-08 DIAGNOSIS — I4891 Unspecified atrial fibrillation: Secondary | ICD-10-CM

## 2011-07-08 DIAGNOSIS — E785 Hyperlipidemia, unspecified: Secondary | ICD-10-CM

## 2011-07-08 DIAGNOSIS — I2510 Atherosclerotic heart disease of native coronary artery without angina pectoris: Secondary | ICD-10-CM

## 2011-07-08 NOTE — Patient Instructions (Signed)
Follow up in 3 months with Dr Antoine Poche in [place]  The current medical regimen is effective;  continue present plan and medications.

## 2011-07-08 NOTE — Assessment & Plan Note (Signed)
This is being followed closely by Dr. Christell Constant and his team.

## 2011-07-08 NOTE — Assessment & Plan Note (Signed)
He tolerates this rhythm and rate control and anticoagulation. We will continue with the meds as listed.  

## 2011-07-08 NOTE — Assessment & Plan Note (Signed)
The patient has no new sypmtoms.  No further cardiovascular testing is indicated.  We will continue with aggressive risk reduction and meds as listed.  

## 2011-07-08 NOTE — Telephone Encounter (Signed)
Patient's wife returned my call and notified her that her husband can come off his coumadin 5 days prior to his procedure. Pt's wife verbalized understanding.

## 2011-07-08 NOTE — Progress Notes (Signed)
HPI The patient returns for follow up of CAD.  At the last appt he thought he might have some extra fluid but this was not evident on exam.  Since I last saw him he has been battling a cold.  However, he denies any new symptoms such as chest discomfort, neck or arm discomfort. There has been no new shortness of breath, PND or orthopnea. There have been no reported palpitations, presyncope or syncope.  He did become weak and had muscle soreness after exerting himself last week.    Allergies  Allergen Reactions  . Asa-Apap-Salicyl-Caff     Takes warfarin  . Codeine   . Nsaids Other (See Comments)    On warfarin  . Sulfonamide Derivatives     Current Outpatient Prescriptions  Medication Sig Dispense Refill  . amoxicillin (AMOXIL) 500 MG capsule Take 500 mg by mouth 3 (three) times daily.        Marland Kitchen aspirin 81 MG tablet Take 81 mg by mouth daily.        Marland Kitchen atorvastatin (LIPITOR) 40 MG tablet Take 40 mg by mouth at bedtime.        . calcium citrate-vitamin D (CITRACAL+D) 315-200 MG-UNIT per tablet Take 1 tablet by mouth.        . Cholecalciferol (VITAMIN D) 1000 UNITS capsule Take 1,000 Units by mouth daily.        . digoxin (LANOXIN) 0.125 MG tablet Take 125 mcg by mouth daily.        . fish oil-omega-3 fatty acids 1000 MG capsule Take 2 g by mouth daily.        . furosemide (LASIX) 40 MG tablet Take 40 mg by mouth daily. If needed for fld       . glipiZIDE-metformin (METAGLIP) 2.5-500 MG per tablet Take 1 tablet by mouth daily.        . mesalamine (APRISO) 0.375 G 24 hr capsule Take 1 capsule (0.375 g total) by mouth daily. Take 4 tablets by mouth every morning.  120 capsule  5  . metoprolol (LOPRESSOR) 100 MG tablet Take 100 mg by mouth. One and one half qd       . niacin (NIASPAN) 500 MG CR tablet Take 500 mg by mouth at bedtime.        . nitroGLYCERIN (NITROSTAT) 0.3 MG SL tablet Place 0.3 mg under the tongue every 5 (five) minutes as needed. Never had to use       . omeprazole (PRILOSEC) 20  MG capsule Take 20 mg by mouth daily.        Marland Kitchen PARoxetine (PAXIL) 20 MG tablet One tab to one half  qd      . peg 3350 powder (MOVIPREP) 100 G SOLR Take 1 kit (100 g total) by mouth once.  1 kit  0  . potassium chloride SA (K-DUR,KLOR-CON) 20 MEQ tablet One half qd      . silodosin (RAPAFLO) 8 MG CAPS capsule Take 8 mg by mouth daily.        Marland Kitchen warfarin (COUMADIN) 5 MG tablet Take 5 mg by mouth daily.          Past Medical History  Diagnosis Date  . PAF (paroxysmal atrial fibrillation)   . Dyslipidemia   . Anxiety   . GIB (gastrointestinal bleeding)   . Obesity   . UC (ulcerative colitis confined to rectum)     Left-sided  . BPH (benign prostatic hyperplasia)   . Diverticulosis   . GERD (gastroesophageal  reflux disease)   . Hemorrhoids   . Diabetes mellitus   . Diabetes mellitus   . CAD (coronary artery disease)     Anterior MI and DES stenting 2004, Vfib arrest; left main 25% stenosis, LAD 80% stenosis, 99% stenosis, 25% circumflex stenosis, 25% ramus intermedius stenosis, 50-40% right coronary artery stenosis, 90% RV branch stenosis with right to left collaterals; EF 55% with mild naterior hypokinesis (underwent Taxus stenting at the time)  . Hyperlipidemia   . Hyperplastic colon polyp     Past Surgical History  Procedure Date  . Hammer toe surgery     right  . Inguinal hernia repair     right, bilateral  . Coronary angioplasty with stent placement     Taxus stenting    ROS: As stated in the HPI and negative for all other systems.  PHYSICAL EXAM BP 144/78  Pulse 98  Resp 16  Ht 5\' 9"  (1.753 m)  Wt 201 lb (91.173 kg)  BMI 29.68 kg/m2 GENERAL:  Well appearing HEENT:  Pupils equal round and reactive, fundi not visualized, oral mucosa unremarkable NECK:  No jugular venous distention, waveform within normal limits, carotid upstroke brisk and symmetric, no bruits, no thyromegaly LYMPHATICS:  No cervical, inguinal adenopathy LUNGS:  Clear to auscultation  bilaterally BACK:  No CVA tenderness CHEST:  Unremarkable HEART:  PMI not displaced or sustained,S1 and S2 within normal limits, no S3, no clicks, no rubs, no murmurs, irregular ABD:  Flat, positive bowel sounds normal in frequency in pitch, no bruits, no rebound, no guarding, no midline pulsatile mass, no hepatomegaly, no splenomegaly EXT:  2 plus pulses throughout, no edema, no cyanosis no clubbing SKIN:  No rashes no nodules NEURO:  Cranial nerves II through XII grossly intact, motor grossly intact throughout, resting tremor PSYCH:  Cognitively intact, oriented to person place and time  ASSESSMENT AND PLAN

## 2011-07-14 ENCOUNTER — Encounter: Payer: Self-pay | Admitting: Gastroenterology

## 2011-07-14 ENCOUNTER — Ambulatory Visit (AMBULATORY_SURGERY_CENTER): Payer: Medicare Other | Admitting: Gastroenterology

## 2011-07-14 VITALS — BP 140/81 | HR 102 | Temp 99.3°F | Resp 20 | Ht 69.0 in | Wt 205.0 lb

## 2011-07-14 DIAGNOSIS — K5150 Left sided colitis without complications: Secondary | ICD-10-CM

## 2011-07-14 DIAGNOSIS — K644 Residual hemorrhoidal skin tags: Secondary | ICD-10-CM

## 2011-07-14 DIAGNOSIS — K5730 Diverticulosis of large intestine without perforation or abscess without bleeding: Secondary | ICD-10-CM

## 2011-07-14 DIAGNOSIS — K648 Other hemorrhoids: Secondary | ICD-10-CM

## 2011-07-14 DIAGNOSIS — Z1211 Encounter for screening for malignant neoplasm of colon: Secondary | ICD-10-CM

## 2011-07-14 DIAGNOSIS — K5190 Ulcerative colitis, unspecified, without complications: Secondary | ICD-10-CM

## 2011-07-14 MED ORDER — SODIUM CHLORIDE 0.9 % IV SOLN: 500.0000 mL | INTRAVENOUS | Status: DC

## 2011-07-14 NOTE — Patient Instructions (Signed)
RESUME COUMADIN AND ALL MEDICATIONS TODAY. INFORMATION GIVEN ON DIVERTICULOSIS, HEMORRHOIDS AND HIGH FIBER DIET.LIBERAL FLUIDS.

## 2011-07-15 ENCOUNTER — Telehealth: Payer: Self-pay | Admitting: *Deleted

## 2011-07-15 NOTE — Telephone Encounter (Signed)
Called number, identifier only for [name], no message left.

## 2011-07-20 ENCOUNTER — Encounter: Payer: Self-pay | Admitting: Gastroenterology

## 2011-09-01 ENCOUNTER — Encounter: Payer: Self-pay | Admitting: Cardiology

## 2011-09-19 ENCOUNTER — Other Ambulatory Visit: Payer: Self-pay | Admitting: Gastroenterology

## 2011-10-09 ENCOUNTER — Encounter: Payer: Self-pay | Admitting: Cardiology

## 2011-10-21 ENCOUNTER — Ambulatory Visit (INDEPENDENT_AMBULATORY_CARE_PROVIDER_SITE_OTHER): Payer: Medicare Other | Admitting: Cardiology

## 2011-10-21 ENCOUNTER — Encounter: Payer: Self-pay | Admitting: Cardiology

## 2011-10-21 DIAGNOSIS — I2510 Atherosclerotic heart disease of native coronary artery without angina pectoris: Secondary | ICD-10-CM

## 2011-10-21 DIAGNOSIS — E785 Hyperlipidemia, unspecified: Secondary | ICD-10-CM

## 2011-10-21 DIAGNOSIS — I4891 Unspecified atrial fibrillation: Secondary | ICD-10-CM

## 2011-10-21 DIAGNOSIS — I480 Paroxysmal atrial fibrillation: Secondary | ICD-10-CM

## 2011-10-21 NOTE — Progress Notes (Signed)
HPI The patient returns for follow up of CAD.  Since I last saw him he has done well.  The patient denies any new symptoms such as chest discomfort, neck or arm discomfort. There has been no new shortness of breath, PND or orthopnea. There have been no reported palpitations, presyncope or syncope.  He exercise routinely.  Allergies  Allergen Reactions  . Asa-Apap-Salicyl-Caff     Takes warfarin  . Codeine   . Nsaids Other (See Comments)    On warfarin  . Sulfonamide Derivatives     Current Outpatient Prescriptions  Medication Sig Dispense Refill  . APRISO 0.375 G 24 hr capsule TAKE 4 CAPSULES IN THE MORNING  120 capsule  8  . aspirin 81 MG tablet Take 81 mg by mouth daily.        Marland Kitchen atorvastatin (LIPITOR) 40 MG tablet Take 40 mg by mouth at bedtime.        . digoxin (LANOXIN) 0.125 MG tablet Take 125 mcg by mouth daily.        . fish oil-omega-3 fatty acids 1000 MG capsule Take 2 g by mouth daily.        Marland Kitchen glipiZIDE-metformin (METAGLIP) 2.5-500 MG per tablet Take 1 tablet by mouth daily.        . metoprolol (LOPRESSOR) 100 MG tablet Take 100 mg by mouth. One and one half qd       . niacin (NIASPAN) 500 MG CR tablet Take 500 mg by mouth at bedtime.        . nitroGLYCERIN (NITROSTAT) 0.3 MG SL tablet Place 0.3 mg under the tongue every 5 (five) minutes as needed. Never had to use       . omeprazole (PRILOSEC) 20 MG capsule Take 20 mg by mouth daily.        Marland Kitchen PARoxetine (PAXIL) 20 MG tablet One tab to one half  qd      . silodosin (RAPAFLO) 8 MG CAPS capsule Take 8 mg by mouth daily.        Marland Kitchen warfarin (COUMADIN) 5 MG tablet Take 5 mg by mouth daily.         Current Facility-Administered Medications  Medication Dose Route Frequency Provider Last Rate Last Dose  . 0.9 %  sodium chloride infusion  500 mL Intravenous Continuous Eliezer Bottom., MD,FACG        Past Medical History  Diagnosis Date  . PAF (paroxysmal atrial fibrillation)   . Dyslipidemia   . Anxiety   . GIB  (gastrointestinal bleeding)   . Obesity   . UC (ulcerative colitis confined to rectum)     Left-sided  . BPH (benign prostatic hyperplasia)   . Diverticulosis   . GERD (gastroesophageal reflux disease)   . Hemorrhoids   . Diabetes mellitus   . Diabetes mellitus   . CAD (coronary artery disease)     Anterior MI and DES stenting 2004, Vfib arrest; left main 25% stenosis, LAD 80% stenosis, 99% stenosis, 25% circumflex stenosis, 25% ramus intermedius stenosis, 50-40% right coronary artery stenosis, 90% RV branch stenosis with right to left collaterals; EF 55% with mild naterior hypokinesis (underwent Taxus stenting at the time)  . Hyperlipidemia   . Hyperplastic colon polyp   . Myocardial infarction   . Hypertension     Past Surgical History  Procedure Date  . Hammer toe surgery     right  . Inguinal hernia repair     right, bilateral  . Coronary angioplasty with  stent placement     Taxus stenting    ROS: As stated in the HPI and negative for all other systems.  PHYSICAL EXAM BP 142/92  Pulse 97  Ht 5\' 9"  (1.753 m)  Wt 201 lb (91.173 kg)  BMI 29.68 kg/m2 GENERAL:  Well appearing HEENT:  Pupils equal round and reactive, fundi not visualized, oral mucosa unremarkable NECK:  No jugular venous distention, waveform within normal limits, carotid upstroke brisk and symmetric, no bruits, no thyromegaly LYMPHATICS:  No cervical, inguinal adenopathy LUNGS:  Clear to auscultation bilaterally BACK:  No CVA tenderness CHEST:  Unremarkable HEART:  PMI not displaced or sustained,S1 and S2 within normal limits, no S3, no clicks, no rubs, no murmurs, irregular ABD:  Flat, positive bowel sounds normal in frequency in pitch, no bruits, no rebound, no guarding, no midline pulsatile mass, no hepatomegaly, no splenomegaly EXT:  2 plus pulses throughout, no edema, no cyanosis no clubbing SKIN:  No rashes no nodules NEURO:  Cranial nerves II through XII grossly intact, motor grossly intact  throughout, resting tremor PSYCH:  Cognitively intact, oriented to person place and time  EKG:  Sinus rhythm, rate 99, poor anterior R wave progression, no acute ST-T wave changes. 10/21/2011   ASSESSMENT AND PLAN

## 2011-10-21 NOTE — Assessment & Plan Note (Signed)
He has had no new symptoms since the stress this in [name] of this year.  No further cardiovascular testing is indicated.  We will continue with aggressive risk reduction and meds as listed.

## 2011-10-21 NOTE — Assessment & Plan Note (Signed)
His LDL was 70 and HDL was 42 in Oct.  He will continue the meds as listed

## 2011-10-21 NOTE — Patient Instructions (Addendum)
Follow up in 3 months with Dr Antoine Poche.  You will receive a letter in the mail 2 months before you are due.  Please call us when you receive this letter to schedule your follow up appointment.  Continue current medications as listed.

## 2011-10-21 NOTE — Assessment & Plan Note (Signed)
He has no symptomatic paroxysms and he tolerates the warfarin.  No change in therapy.

## 2011-12-16 ENCOUNTER — Encounter: Payer: Self-pay | Admitting: Cardiology

## 2011-12-17 ENCOUNTER — Encounter: Payer: Self-pay | Admitting: Cardiology

## 2011-12-21 ENCOUNTER — Encounter: Payer: Self-pay | Admitting: Cardiology

## 2012-01-20 ENCOUNTER — Ambulatory Visit (INDEPENDENT_AMBULATORY_CARE_PROVIDER_SITE_OTHER): Payer: Medicare Other | Admitting: Cardiology

## 2012-01-20 ENCOUNTER — Encounter: Payer: Self-pay | Admitting: Cardiology

## 2012-01-20 DIAGNOSIS — E785 Hyperlipidemia, unspecified: Secondary | ICD-10-CM

## 2012-01-20 DIAGNOSIS — I4891 Unspecified atrial fibrillation: Secondary | ICD-10-CM

## 2012-01-20 DIAGNOSIS — I2510 Atherosclerotic heart disease of native coronary artery without angina pectoris: Secondary | ICD-10-CM

## 2012-01-20 NOTE — Patient Instructions (Signed)
The current medical regimen is effective;  continue present plan and medications.  Follow up with Dr Antoine Poche as scheduled

## 2012-01-20 NOTE — Assessment & Plan Note (Addendum)
He has had no new symptoms since the stress this in [name] of last year.  No further cardiovascular testing is indicated.  We will continue with aggressive risk reduction and meds as listed.

## 2012-01-20 NOTE — Assessment & Plan Note (Addendum)
This is being followed closely by Dr. Christell Constant and his team.  His LDL in January was 61 and HDL 33.

## 2012-01-20 NOTE — Assessment & Plan Note (Signed)
He tolerates this rhythm and rate control and anticoagulation. We will continue with the meds as listed.  

## 2012-01-20 NOTE — Progress Notes (Signed)
HPI The patient returns for follow up of CAD.  Since I last saw him he has done well.  The patient denies any new symptoms such as chest discomfort, neck or arm discomfort. There has been no new shortness of breath, PND or orthopnea. There have been no reported palpitations, presyncope or syncope.  He exercise routinely but only 3 x per week.  Allergies  Allergen Reactions  . Asa-Apap-Salicyl-Caff     Takes warfarin  . Codeine   . Nsaids Other (See Comments)    On warfarin  . Sulfonamide Derivatives     Current Outpatient Prescriptions  Medication Sig Dispense Refill  . APRISO 0.375 G 24 hr capsule TAKE 4 CAPSULES IN THE MORNING  120 capsule  8  . aspirin 81 MG tablet Take 81 mg by mouth daily.        Marland Kitchen atorvastatin (LIPITOR) 40 MG tablet Take 40 mg by mouth at bedtime.        . digoxin (LANOXIN) 0.125 MG tablet Take 125 mcg by mouth daily.        . fish oil-omega-3 fatty acids 1000 MG capsule Take 2 g by mouth daily.        Marland Kitchen glipiZIDE-metformin (METAGLIP) 2.5-500 MG per tablet Take 1 tablet by mouth daily.        . metoprolol (LOPRESSOR) 100 MG tablet Take 100 mg by mouth. One and one half qd       . niacin (NIASPAN) 500 MG CR tablet Take 500 mg by mouth at bedtime.        . nitroGLYCERIN (NITROSTAT) 0.3 MG SL tablet Place 0.3 mg under the tongue every 5 (five) minutes as needed. Never had to use       . omeprazole (PRILOSEC) 20 MG capsule Take 20 mg by mouth daily.        Marland Kitchen PARoxetine (PAXIL) 20 MG tablet One tab to one half  qd      . silodosin (RAPAFLO) 8 MG CAPS capsule Take 8 mg by mouth daily.        Marland Kitchen warfarin (COUMADIN) 5 MG tablet Take 5 mg by mouth daily.         Current Facility-Administered Medications  Medication Dose Route Frequency Provider Last Rate Last Dose  . 0.9 %  sodium chloride infusion  500 mL Intravenous Continuous Eliezer Bottom., MD,FACG        Past Medical History  Diagnosis Date  . PAF (paroxysmal atrial fibrillation)   . Dyslipidemia   .  Anxiety   . GIB (gastrointestinal bleeding)   . Obesity   . UC (ulcerative colitis confined to rectum)     Left-sided  . BPH (benign prostatic hyperplasia)   . Diverticulosis   . GERD (gastroesophageal reflux disease)   . Hemorrhoids   . Diabetes mellitus   . Diabetes mellitus   . CAD (coronary artery disease)     Anterior MI and DES stenting 2004, Vfib arrest; left main 25% stenosis, LAD 80% stenosis, 99% stenosis, 25% circumflex stenosis, 25% ramus intermedius stenosis, 50-40% right coronary artery stenosis, 90% RV branch stenosis with right to left collaterals; EF 55% with mild naterior hypokinesis (underwent Taxus stenting at the time) . Stress perfusion study January 2012 EF 48% with old apical infarct  . Hyperlipidemia   . Hyperplastic colon polyp   . Myocardial infarction   . Hypertension     Past Surgical History  Procedure Date  . Hammer toe surgery  right  . Inguinal hernia repair     right, bilateral  . Coronary angioplasty with stent placement     Taxus stenting    ROS: As stated in the HPI and negative for all other systems.  PHYSICAL EXAM BP 110/70  Pulse 60  Ht 5\' 11"  (1.803 m)  Wt 194 lb (87.998 kg)  BMI 27.06 kg/m2 GENERAL:  Well appearing HEENT:  Pupils equal round and reactive, fundi not visualized, oral mucosa unremarkable NECK:  No jugular venous distention, waveform within normal limits, carotid upstroke brisk and symmetric, no bruits, no thyromegaly LYMPHATICS:  No cervical, inguinal adenopathy LUNGS:  Clear to auscultation bilaterally BACK:  No CVA tenderness CHEST:  Unremarkable HEART:  PMI not displaced or sustained,S1 and S2 within normal limits, no S3, no clicks, no rubs, no murmurs, irregular ABD:  Flat, positive bowel sounds normal in frequency in pitch, no bruits, no rebound, no guarding, no midline pulsatile mass, no hepatomegaly, no splenomegaly EXT:  2 plus pulses throughout, mild left ankle edema, no cyanosis no clubbing SKIN:  No  rashes no nodules NEURO:  Cranial nerves II through XII grossly intact, motor grossly intact throughout, resting tremor PSYCH:  Cognitively intact, oriented to person place and time   ASSESSMENT AND PLAN

## 2012-03-17 ENCOUNTER — Other Ambulatory Visit (HOSPITAL_COMMUNITY): Payer: Self-pay | Admitting: Neurology

## 2012-03-17 ENCOUNTER — Other Ambulatory Visit: Payer: Self-pay | Admitting: Neurology

## 2012-03-17 DIAGNOSIS — F079 Unspecified personality and behavioral disorder due to known physiological condition: Secondary | ICD-10-CM

## 2012-03-17 DIAGNOSIS — R259 Unspecified abnormal involuntary movements: Secondary | ICD-10-CM

## 2012-03-21 ENCOUNTER — Ambulatory Visit (HOSPITAL_COMMUNITY)
Admission: RE | Admit: 2012-03-21 | Discharge: 2012-03-21 | Disposition: A | Discharge status: Home / Self Care | Payer: Medicare Other | Source: Ambulatory Visit | Attending: Neurology | Admitting: Neurology

## 2012-03-21 DIAGNOSIS — R259 Unspecified abnormal involuntary movements: Secondary | ICD-10-CM

## 2012-03-21 DIAGNOSIS — G319 Degenerative disease of nervous system, unspecified: Secondary | ICD-10-CM | POA: Insufficient documentation

## 2012-04-20 ENCOUNTER — Encounter: Payer: Self-pay | Admitting: Cardiology

## 2012-04-20 ENCOUNTER — Ambulatory Visit (INDEPENDENT_AMBULATORY_CARE_PROVIDER_SITE_OTHER): Payer: Medicare Other | Admitting: Cardiology

## 2012-04-20 VITALS — BP 125/70 | Ht 69.0 in | Wt 188.0 lb

## 2012-04-20 DIAGNOSIS — I4891 Unspecified atrial fibrillation: Secondary | ICD-10-CM

## 2012-04-20 DIAGNOSIS — I2510 Atherosclerotic heart disease of native coronary artery without angina pectoris: Secondary | ICD-10-CM

## 2012-04-20 DIAGNOSIS — E785 Hyperlipidemia, unspecified: Secondary | ICD-10-CM

## 2012-04-20 NOTE — Assessment & Plan Note (Signed)
He has had no new symptoms since the stress this in [name] of last year.  No further cardiovascular testing is indicated.  We will continue with aggressive risk reduction and meds as listed.

## 2012-04-20 NOTE — Assessment & Plan Note (Signed)
[name] has a CHA2DS2 - VASc score of 7 with a risk of stroke of 9.6%  and a HAS - BLED score of 3.with one validation study suggesting a risk of 3.72 bleeds per 100 patient years.  His warfarin is followed by his primary providers. He will continue with meds as listed. Because of the recent CVA I will continue him on aspirin though I will likely discontinue this in the future.

## 2012-04-20 NOTE — Assessment & Plan Note (Signed)
This is followed closely by Dr. Moore. No change in therapy is indicated. 

## 2012-04-20 NOTE — Progress Notes (Signed)
HPI The patient returns for follow up of CAD.  Since I last saw him he reports having had a stroke.   He had some slurred speech and was near [facility].  I don't have the records and he reports having had carotid Dopplers and an echo. He has no residual from this. His medications were not changed.   The patient denies any new symptoms such as chest discomfort, neck or arm discomfort. There has been no new shortness of breath, PND or orthopnea. There have been no reported palpitations, presyncope or syncope.  He exercise routinely in 3 x per week.  Allergies  Allergen Reactions  . Asa-Apap-Salicyl-Caff     Takes warfarin  . Codeine   . Nsaids Other (See Comments)    On warfarin  . Sulfonamide Derivatives     Current Outpatient Prescriptions  Medication Sig Dispense Refill  . APRISO 0.375 G 24 hr capsule TAKE 4 CAPSULES IN THE MORNING  120 capsule  8  . aspirin 81 MG tablet Take 81 mg by mouth daily.        Marland Kitchen atorvastatin (LIPITOR) 40 MG tablet Take 40 mg by mouth at bedtime.        . digoxin (LANOXIN) 0.125 MG tablet Take 125 mcg by mouth daily.        . fish oil-omega-3 fatty acids 1000 MG capsule Take 2 g by mouth daily.        Marland Kitchen glipiZIDE-metformin (METAGLIP) 2.5-500 MG per tablet Take 1 tablet by mouth daily.        . metoprolol (LOPRESSOR) 100 MG tablet Take 100 mg by mouth. One and one half qd       . niacin (NIASPAN) 500 MG CR tablet Take 500 mg by mouth at bedtime.        . nitroGLYCERIN (NITROSTAT) 0.3 MG SL tablet Place 0.3 mg under the tongue every 5 (five) minutes as needed. Never had to use       . omeprazole (PRILOSEC) 20 MG capsule Take 20 mg by mouth daily.        Marland Kitchen PARoxetine (PAXIL) 20 MG tablet One tab to one half  qd      . silodosin (RAPAFLO) 8 MG CAPS capsule Take 8 mg by mouth daily.        Marland Kitchen warfarin (COUMADIN) 5 MG tablet Take 5 mg by mouth daily.         Current Facility-Administered Medications  Medication Dose Route Frequency Provider Last Rate  Last Dose  . 0.9 %  sodium chloride infusion  500 mL Intravenous Continuous Meryl Dare, MD,FACG        Past Medical History  Diagnosis Date  . PAF (paroxysmal atrial fibrillation)   . Dyslipidemia   . Anxiety   . GIB (gastrointestinal bleeding)   . Obesity   . UC (ulcerative colitis confined to rectum)     Left-sided  . BPH (benign prostatic hyperplasia)   . Diverticulosis   . GERD (gastroesophageal reflux disease)   . Hemorrhoids   . Diabetes mellitus   . Diabetes mellitus   . CAD (coronary artery disease)     Anterior MI and DES stenting 2004, Vfib arrest; left main 25% stenosis, LAD 80% stenosis, 99% stenosis, 25% circumflex stenosis, 25% ramus intermedius stenosis, 50-40% right coronary artery stenosis, 90% RV branch stenosis with right to left collaterals; EF 55% with mild naterior hypokinesis (underwent Taxus stenting at the time) . Stress perfusion study January 2012 EF 48%  with old apical infarct  . Hyperlipidemia   . Hyperplastic colon polyp   . Myocardial infarction   . Hypertension     Past Surgical History  Procedure Date  . Hammer toe surgery     right  . Inguinal hernia repair     right, bilateral  . Coronary angioplasty with stent placement     Taxus stenting    ROS: As stated in the HPI and negative for all other systems.  PHYSICAL EXAM BP 125/70  Ht 5\' 9"  (1.753 m)  Wt 188 lb (85.276 kg)  BMI 27.76 kg/m2 GENERAL:  Well appearing HEENT:  Pupils equal round and reactive, fundi not visualized, oral mucosa unremarkable NECK:  No jugular venous distention, waveform within normal limits, carotid upstroke brisk and symmetric, no bruits, no thyromegaly LYMPHATICS:  No cervical, inguinal adenopathy LUNGS:  Clear to auscultation bilaterally BACK:  No CVA tenderness CHEST:  Unremarkable HEART:  PMI not displaced or sustained,S1 and S2 within normal limits, no S3, no clicks, no rubs, no murmurs, irregular ABD:  Flat, positive bowel sounds normal in  frequency in pitch, no bruits, no rebound, no guarding, no midline pulsatile mass, no hepatomegaly, no splenomegaly EXT:  2 plus pulses throughout, mild left ankle edema, no cyanosis no clubbing SKIN:  No rashes no nodules NEURO:  Cranial nerves II through XII grossly intact, motor grossly intact throughout, resting tremor PSYCH:  Cognitively intact, oriented to person place and time  EKG: Atrial fibrillation rate 91, no acute ST T wave changes. 04/20/2012  ASSESSMENT AND PLAN

## 2012-04-20 NOTE — Patient Instructions (Addendum)
The current medical regimen is effective;  continue present plan and medications.  Follow up in 3 months with Dr Hochrein in [place]. 

## 2012-05-18 ENCOUNTER — Ambulatory Visit: Payer: Medicare Other | Admitting: Cardiology

## 2012-07-26 ENCOUNTER — Encounter: Payer: Self-pay | Admitting: Cardiology

## 2012-08-02 NOTE — Progress Notes (Deleted)
HPI The patient returns for follow up of CAD.  Since I last saw him he reports having had a stroke.   He had some slurred speech and was near [facility].  I don't have the records and he reports having had carotid Dopplers and an echo. He has no residual from this. His medications were not changed.   The patient denies any new symptoms such as chest discomfort, neck or arm discomfort. There has been no new shortness of breath, PND or orthopnea. There have been no reported palpitations, presyncope or syncope.  He exercise routinely in 3 x per week.  Allergies  Allergen Reactions  . Asa-Apap-Salicyl-Caff     Takes warfarin  . Codeine   . Nsaids Other (See Comments)    On warfarin  . Sulfonamide Derivatives     Current Outpatient Prescriptions  Medication Sig Dispense Refill  . aspirin 81 MG tablet Take 81 mg by mouth daily.        Marland Kitchen atorvastatin (LIPITOR) 40 MG tablet Take 40 mg by mouth at bedtime.        . digoxin (LANOXIN) 0.125 MG tablet Take 125 mcg by mouth daily.        Marland Kitchen glipiZIDE-metformin (METAGLIP) 2.5-500 MG per tablet Take 1 tablet by mouth daily.        . metoprolol (LOPRESSOR) 100 MG tablet Take 100 mg by mouth. One and one half qd       . niacin (NIASPAN) 500 MG CR tablet Take 500 mg by mouth at bedtime.        . nitroGLYCERIN (NITROSTAT) 0.3 MG SL tablet Place 0.3 mg under the tongue every 5 (five) minutes as needed. Never had to use       . omeprazole (PRILOSEC) 20 MG capsule Take 20 mg by mouth daily.        Marland Kitchen PARoxetine (PAXIL) 20 MG tablet One tab to one half  qd      . potassium chloride SA (K-DUR,KLOR-CON) 20 MEQ tablet Take 20 mEq by mouth daily. 1/2 TAB DAILY      . silodosin (RAPAFLO) 8 MG CAPS capsule Take 8 mg by mouth daily.        Marland Kitchen warfarin (COUMADIN) 5 MG tablet Take 5 mg by mouth daily.         Current Facility-Administered Medications  Medication Dose Route Frequency Provider Last Rate Last Dose  . 0.9 %  sodium chloride infusion  500 mL  Intravenous Continuous Meryl Dare, MD,FACG        Past Medical History  Diagnosis Date  . PAF (paroxysmal atrial fibrillation)   . Dyslipidemia   . Anxiety   . GIB (gastrointestinal bleeding)   . Obesity   . UC (ulcerative colitis confined to rectum)     Left-sided  . BPH (benign prostatic hyperplasia)   . Diverticulosis   . GERD (gastroesophageal reflux disease)   . Hemorrhoids   . Diabetes mellitus   . Diabetes mellitus   . CAD (coronary artery disease)     Anterior MI and DES stenting 2004, Vfib arrest; left main 25% stenosis, LAD 80% stenosis, 99% stenosis, 25% circumflex stenosis, 25% ramus intermedius stenosis, 50-40% right coronary artery stenosis, 90% RV branch stenosis with right to left collaterals; EF 55% with mild naterior hypokinesis (underwent Taxus stenting at the time) . Stress perfusion study January 2012 EF 48% with old apical infarct  . Hyperlipidemia   . Hyperplastic colon polyp   . Myocardial  infarction   . Hypertension     Past Surgical History  Procedure Date  . Hammer toe surgery     right  . Inguinal hernia repair     right, bilateral  . Coronary angioplasty with stent placement     Taxus stenting    ROS: As stated in the HPI and negative for all other systems.  PHYSICAL EXAM There were no vitals taken for this visit. GENERAL:  Well appearing HEENT:  Pupils equal round and reactive, fundi not visualized, oral mucosa unremarkable NECK:  No jugular venous distention, waveform within normal limits, carotid upstroke brisk and symmetric, no bruits, no thyromegaly LYMPHATICS:  No cervical, inguinal adenopathy LUNGS:  Clear to auscultation bilaterally BACK:  No CVA tenderness CHEST:  Unremarkable HEART:  PMI not displaced or sustained,S1 and S2 within normal limits, no S3, no clicks, no rubs, no murmurs, irregular ABD:  Flat, positive bowel sounds normal in frequency in pitch, no bruits, no rebound, no guarding, no midline pulsatile mass, no  hepatomegaly, no splenomegaly EXT:  2 plus pulses throughout, mild left ankle edema, no cyanosis no clubbing SKIN:  No rashes no nodules NEURO:  Cranial nerves II through XII grossly intact, motor grossly intact throughout, resting tremor PSYCH:  Cognitively intact, oriented to person place and time  EKG: Atrial fibrillation rate 91, no acute ST T wave changes. 08/02/2012  ASSESSMENT AND PLAN  CAD (coronary artery disease) -  He has had no new symptoms since the stress this in [name] of last year. No further cardiovascular testing is indicated. We will continue with aggressive risk reduction and meds as listed.   DYSLIPIDEMIA - Gregory Rotunda, MD 04/20/2012 9:56 AM Signed  This is followed closely by Dr. Christell Constant. No change in therapy is indicated. FIBRILLATION, ATRIAL - Gregory Rotunda, MD 04/20/2012 9:58 AM Signed  [name] has a CHA2DS2 - VASc score of 7 with a risk of stroke of 9.6% and a HAS - BLED score of 3.with one validation study suggesting a risk of 3.72 bleeds per 100 patient years. His warfarin is followed by his primary providers. He will continue with meds as listed. Because of the recent CVA I will continue him on aspirin though I will likely discontinue this in the future.   Atrial fibrillation -   Assessment .cprob

## 2012-08-03 ENCOUNTER — Ambulatory Visit (INDEPENDENT_AMBULATORY_CARE_PROVIDER_SITE_OTHER): Payer: Medicare Other | Admitting: Cardiology

## 2012-08-03 ENCOUNTER — Encounter: Payer: Self-pay | Admitting: Cardiology

## 2012-08-03 VITALS — BP 125/80 | HR 98 | Ht 69.0 in | Wt 190.0 lb

## 2012-08-03 DIAGNOSIS — I4891 Unspecified atrial fibrillation: Secondary | ICD-10-CM

## 2012-08-03 DIAGNOSIS — I480 Paroxysmal atrial fibrillation: Secondary | ICD-10-CM

## 2012-08-03 DIAGNOSIS — I491 Atrial premature depolarization: Secondary | ICD-10-CM

## 2012-08-03 DIAGNOSIS — E785 Hyperlipidemia, unspecified: Secondary | ICD-10-CM

## 2012-08-03 NOTE — Progress Notes (Signed)
HPI The patient returns for follow up of CAD and his stroke earlier this year.  Since I last saw him he has done well.  The patient denies any new symptoms such as chest discomfort, neck or arm discomfort. There has been no new shortness of breath, PND or orthopnea. There have been no reported palpitations, presyncope or syncope.  He did see neurology for a resting tremor recently.  Allergies  Allergen Reactions  . Asa-Apap-Salicyl-Caff     Takes warfarin  . Codeine   . Nsaids Other (See Comments)    On warfarin  . Sulfonamide Derivatives     Current Outpatient Prescriptions  Medication Sig Dispense Refill  . aspirin 81 MG tablet Take 81 mg by mouth daily.        Marland Kitchen atorvastatin (LIPITOR) 40 MG tablet Take 40 mg by mouth at bedtime.        . digoxin (LANOXIN) 0.125 MG tablet Take 125 mcg by mouth daily.        . diphenhydrAMINE (BENADRYL) 12.5 MG chewable tablet Chew 12.5 mg by mouth 3 (three) times daily.      Marland Kitchen glipiZIDE-metformin (METAGLIP) 2.5-500 MG per tablet Take 1 tablet by mouth daily.        . metoprolol (LOPRESSOR) 100 MG tablet Take 100 mg by mouth. One and one half qd       . niacin (NIASPAN) 500 MG CR tablet Take 500 mg by mouth at bedtime.        . nitroGLYCERIN (NITROSTAT) 0.3 MG SL tablet Place 0.3 mg under the tongue every 5 (five) minutes as needed. Never had to use       . NON FORMULARY daily. appiso 0.37 grams 4 caps daily      . omeprazole (PRILOSEC) 20 MG capsule Take 20 mg by mouth daily.        Marland Kitchen PARoxetine (PAXIL) 20 MG tablet One tab to one half  qd      . potassium chloride SA (K-DUR,KLOR-CON) 20 MEQ tablet Take 20 mEq by mouth daily. 1/2 TAB DAILY      . silodosin (RAPAFLO) 8 MG CAPS capsule Take 8 mg by mouth daily.        Marland Kitchen warfarin (COUMADIN) 5 MG tablet Take 5 mg by mouth daily.         Current Facility-Administered Medications  Medication Dose Route Frequency Provider Last Rate Last Dose  . 0.9 %  sodium chloride infusion  500 mL Intravenous  Continuous Meryl Dare, MD,FACG        Past Medical History  Diagnosis Date  . PAF (paroxysmal atrial fibrillation)   . Dyslipidemia   . Anxiety   . GIB (gastrointestinal bleeding)   . Obesity   . UC (ulcerative colitis confined to rectum)     Left-sided  . BPH (benign prostatic hyperplasia)   . Diverticulosis   . GERD (gastroesophageal reflux disease)   . Hemorrhoids   . Diabetes mellitus   . Diabetes mellitus   . CAD (coronary artery disease)     Anterior MI and DES stenting 2004, Vfib arrest; left main 25% stenosis, LAD 80% stenosis, 99% stenosis, 25% circumflex stenosis, 25% ramus intermedius stenosis, 50-40% right coronary artery stenosis, 90% RV branch stenosis with right to left collaterals; EF 55% with mild naterior hypokinesis (underwent Taxus stenting at the time) . Stress perfusion study January 2012 EF 48% with old apical infarct  . Hyperlipidemia   . Hyperplastic colon polyp   .  Myocardial infarction   . Hypertension     Past Surgical History  Procedure Date  . Hammer toe surgery     right  . Inguinal hernia repair     right, bilateral  . Coronary angioplasty with stent placement     Taxus stenting    ROS: As stated in the HPI and negative for all other systems.  PHYSICAL EXAM BP 125/80  Pulse 98  Ht 5\' 9"  (1.753 m)  Wt 190 lb (86.183 kg)  BMI 28.06 kg/m2 GENERAL:  Well appearing NECK:  No jugular venous distention, waveform within normal limits, carotid upstroke brisk and symmetric, no bruits, no thyromegaly LUNGS:  Clear to auscultation bilaterally BACK:  No CVA tenderness CHEST:  Unremarkable HEART:  PMI not displaced or sustained,S1 and S2 within normal limits, no S3, no clicks, no rubs, no murmurs, irregular ABD:  Flat, positive bowel sounds normal in frequency in pitch, no bruits, no rebound, no guarding, no midline pulsatile mass, no hepatomegaly, no splenomegaly EXT:  2 plus pulses throughout, mild left ankle edema, no cyanosis no  clubbing NEURO:  Cranial nerves II through XII grossly intact, motor grossly intact throughout, resting tremor   EKG: Atrial fibrillation rate 90, no acute ST T wave changes. 08/03/2012  ASSESSMENT AND PLAN  CAD (coronary artery disease) - [name], MD 04/20/2012 9:56 AM Signed    DYSLIPIDEMIA -  This is followed closely by Dr. Christell Constant. No change in therapy is indicated.  On 9/5 the LDL was 55 with an HDL of 40.   FIBRILLATION, ATRIAL - [name], MD 04/20/2012 9:58 AM Signed  [name] has a CHA2DS2 - VASc score of 7 with a risk of stroke of 9.6%.   His warfarin is followed by his primary providers. He will continue with meds as listed. Because of the CVA earlier this yearI will continue him on aspirin.

## 2012-08-03 NOTE — Patient Instructions (Addendum)
The current medical regimen is effective;  continue present plan and medications.  Follow up in 3 months with Dr Hochrein in [place]. 

## 2012-09-08 ENCOUNTER — Other Ambulatory Visit: Payer: Self-pay | Admitting: Gastroenterology

## 2012-09-08 NOTE — Telephone Encounter (Signed)
NEEDS OFFICE VISIT FOR ANY FURTHER REFILLS! 

## 2012-10-12 ENCOUNTER — Encounter (HOSPITAL_COMMUNITY): Payer: Self-pay | Admitting: Pharmacy Technician

## 2012-10-17 NOTE — Patient Instructions (Addendum)
Your procedure is scheduled on: 10/27/2012  Report to [facility] at   930       AM.  Call this number if you have problems the morning of surgery: [phone]   Do not eat food or drink liquids :After Midnight.      Take these medicines the morning of surgery with A SIP OF WATER:paxil,digoxin,metoprolol,prilosec. No diabetic medicine the morning of your surgery.   Do not wear jewelry, make-up or nail polish.  Do not wear lotions, powders, or perfumes. You may wear deodorant.  Do not shave 48 hours prior to surgery.  Do not bring valuables to the hospital.  Contacts, dentures or bridgework may not be worn into surgery.  Leave suitcase in the car. After surgery it may be brought to your room.  For patients admitted to the hospital, checkout time is 11:00 AM the day of discharge.   Patients discharged the day of surgery will not be allowed to drive home.  :     Please read over the following fact sheets that you were given: Coughing and Deep Breathing, Surgical Site Infection Prevention, Anesthesia Post-op Instructions and Care and Recovery After Surgery    Cataract A cataract is a clouding of the lens of the eye. When a lens becomes cloudy, vision is reduced based on the degree and nature of the clouding. Many cataracts reduce vision to some degree. Some cataracts make people more near-sighted as they develop. Other cataracts increase glare. Cataracts that are ignored and become worse can sometimes look white. The white color can be seen through the pupil. CAUSES   Aging. However, cataracts may occur at any age, even in newborns.   Certain drugs.   Trauma to the eye.   Certain diseases such as diabetes.   Specific eye diseases such as chronic inflammation inside the eye or a sudden attack of a rare form of glaucoma.   Inherited or acquired medical problems.  SYMPTOMS   Gradual, progressive drop in vision in the affected eye.   Severe, rapid visual loss. This most often happens  when trauma is the cause.  DIAGNOSIS  To detect a cataract, an eye doctor examines the lens. Cataracts are best diagnosed with an exam of the eyes with the pupils enlarged (dilated) by drops.  TREATMENT  For an early cataract, vision may improve by using different eyeglasses or stronger lighting. If that does not help your vision, surgery is the only effective treatment. A cataract needs to be surgically removed when vision loss interferes with your everyday activities, such as driving, reading, or watching TV. A cataract may also have to be removed if it prevents examination or treatment of another eye problem. Surgery removes the cloudy lens and usually replaces it with a substitute lens (intraocular lens, IOL).  At a time when both you and your doctor agree, the cataract will be surgically removed. If you have cataracts in both eyes, only one is usually removed at a time. This allows the operated eye to heal and be out of danger from any possible problems after surgery (such as infection or poor wound healing). In rare cases, a cataract may be doing damage to your eye. In these cases, your caregiver may advise surgical removal right away. The vast majority of people who have cataract surgery have better vision afterward. HOME CARE INSTRUCTIONS  If you are not planning surgery, you may be asked to do the following:  Use different eyeglasses.   Use stronger or brighter  lighting.   Ask your eye doctor about reducing your medicine dose or changing medicines if it is thought that a medicine caused your cataract. Changing medicines does not make the cataract go away on its own.   Become familiar with your surroundings. Poor vision can lead to injury. Avoid bumping into things on the affected side. You are at a higher risk for tripping or falling.   Exercise extreme care when driving or operating machinery.   Wear sunglasses if you are sensitive to bright light or experiencing problems with glare.    SEEK IMMEDIATE MEDICAL CARE IF:   You have a worsening or sudden vision loss.   You notice redness, swelling, or increasing pain in the eye.   You have a fever.  Document Released: 11/09/2005 Document Revised: 10/29/2011 Document Reviewed: 07/03/2011 [facility] Patient Information 2012 [place], [place].PATIENT INSTRUCTIONS POST-ANESTHESIA  IMMEDIATELY FOLLOWING SURGERY:  Do not drive or operate machinery for the first twenty four hours after surgery.  Do not make any important decisions for twenty four hours after surgery or while taking narcotic pain medications or sedatives.  If you develop intractable nausea and vomiting or a severe headache please notify your doctor immediately.  FOLLOW-UP:  Please make an appointment with your surgeon as instructed. You do not need to follow up with anesthesia unless specifically instructed to do so.  WOUND CARE INSTRUCTIONS (if applicable):  Keep a dry clean dressing on the anesthesia/puncture wound site if there is drainage.  Once the wound has quit draining you may leave it open to air.  Generally you should leave the bandage intact for twenty four hours unless there is drainage.  If the epidural site drains for more than 36-48 hours please call the anesthesia department.  QUESTIONS?:  Please feel free to call your physician or the hospital operator if you have any questions, and they will be happy to assist you.

## 2012-10-18 ENCOUNTER — Encounter (HOSPITAL_COMMUNITY): Payer: Self-pay

## 2012-10-18 ENCOUNTER — Encounter (HOSPITAL_COMMUNITY)
Admission: RE | Admit: 2012-10-18 | Discharge: 2012-10-18 | Disposition: A | Discharge status: Home / Self Care | Payer: Medicare Other | Source: Ambulatory Visit | Attending: Ophthalmology | Admitting: Ophthalmology

## 2012-10-18 HISTORY — DX: Cerebral infarction, unspecified: I63.9

## 2012-10-18 LAB — BASIC METABOLIC PANEL
BUN: 10 mg/dL (ref 6–23)
GFR calc Af Amer: 88 mL/min — ABNORMAL LOW (ref >90)
GFR calc non Af Amer: 76 mL/min — ABNORMAL LOW (ref >90)
Potassium: 4.2 mEq/L (ref 3.5–5.1)

## 2012-10-26 MED ORDER — NEOMYCIN-POLYMYXIN-DEXAMETH 3.5-10000-0.1 OP OINT
TOPICAL_OINTMENT | OPHTHALMIC | Status: AC
  Filled 2012-10-26: qty 3.5

## 2012-10-26 MED ORDER — TETRACAINE HCL 0.5 % OP SOLN
OPHTHALMIC | Status: AC
  Filled 2012-10-26: qty 2

## 2012-10-26 MED ORDER — PHENYLEPHRINE HCL 2.5 % OP SOLN
OPHTHALMIC | Status: AC
  Filled 2012-10-26: qty 2

## 2012-10-26 MED ORDER — CYCLOPENTOLATE-PHENYLEPHRINE 0.2-1 % OP SOLN
OPHTHALMIC | Status: AC
  Filled 2012-10-26: qty 2

## 2012-10-26 MED ORDER — LIDOCAINE HCL 3.5 % OP GEL
OPHTHALMIC | Status: AC
  Filled 2012-10-26: qty 5

## 2012-10-26 MED ORDER — LIDOCAINE HCL (PF) 1 % IJ SOLN
INTRAMUSCULAR | Status: AC
  Filled 2012-10-26: qty 2

## 2012-10-27 ENCOUNTER — Encounter (HOSPITAL_COMMUNITY): Payer: Self-pay | Admitting: *Deleted

## 2012-10-27 ENCOUNTER — Encounter (HOSPITAL_COMMUNITY): Payer: Self-pay | Admitting: Anesthesiology

## 2012-10-27 ENCOUNTER — Ambulatory Visit (HOSPITAL_COMMUNITY): Payer: Medicare Other | Admitting: Anesthesiology

## 2012-10-27 ENCOUNTER — Ambulatory Visit (HOSPITAL_COMMUNITY)
Admission: RE | Admit: 2012-10-27 | Discharge: 2012-10-27 | Disposition: A | Discharge status: Home / Self Care | Payer: Medicare Other | Source: Ambulatory Visit | Attending: Ophthalmology | Admitting: Ophthalmology

## 2012-10-27 ENCOUNTER — Encounter (HOSPITAL_COMMUNITY): Payer: Self-pay | Admitting: Ophthalmology

## 2012-10-27 ENCOUNTER — Encounter (HOSPITAL_COMMUNITY)
Admission: RE | Disposition: A | Discharge status: Home / Self Care | Payer: Self-pay | Source: Ambulatory Visit | Attending: Ophthalmology

## 2012-10-27 DIAGNOSIS — H2181 Floppy iris syndrome: Secondary | ICD-10-CM | POA: Insufficient documentation

## 2012-10-27 DIAGNOSIS — H2510 Age-related nuclear cataract, unspecified eye: Secondary | ICD-10-CM | POA: Insufficient documentation

## 2012-10-27 DIAGNOSIS — E119 Type 2 diabetes mellitus without complications: Secondary | ICD-10-CM | POA: Insufficient documentation

## 2012-10-27 DIAGNOSIS — I10 Essential (primary) hypertension: Secondary | ICD-10-CM | POA: Insufficient documentation

## 2012-10-27 DIAGNOSIS — Z01812 Encounter for preprocedural laboratory examination: Secondary | ICD-10-CM | POA: Insufficient documentation

## 2012-10-27 HISTORY — PX: CATARACT EXTRACTION W/PHACO: SHX586

## 2012-10-27 LAB — GLUCOSE, CAPILLARY: Glucose-Capillary: 111 mg/dL — ABNORMAL HIGH (ref 70–99)

## 2012-10-27 SURGERY — PHACOEMULSIFICATION, CATARACT, WITH IOL INSERTION
Anesthesia: Monitor Anesthesia Care | Site: Eye | Laterality: Left | Wound class: Clean

## 2012-10-27 MED ORDER — MIDAZOLAM HCL 5 MG/5ML IJ SOLN
INTRAMUSCULAR | Status: AC
  Filled 2012-10-27: qty 5

## 2012-10-27 MED ORDER — POVIDONE-IODINE 5 % OP SOLN
OPHTHALMIC | Status: DC | PRN
  Administered 2012-10-27: 1 via OPHTHALMIC

## 2012-10-27 MED ORDER — LIDOCAINE HCL 3.5 % OP GEL
1.0000 "application " | Freq: Once | OPHTHALMIC | Status: AC
  Administered 2012-10-27: 1 via OPHTHALMIC

## 2012-10-27 MED ORDER — CYCLOPENTOLATE-PHENYLEPHRINE 0.2-1 % OP SOLN
1.0000 [drp] | OPHTHALMIC | Status: AC
  Administered 2012-10-27 (×3): 1 [drp] via OPHTHALMIC

## 2012-10-27 MED ORDER — PROVISC 10 MG/ML IO SOLN
INTRAOCULAR | Status: DC | PRN
  Administered 2012-10-27: 8.5 mg via INTRAOCULAR

## 2012-10-27 MED ORDER — EPINEPHRINE HCL 1 MG/ML IJ SOLN
INTRAOCULAR | Status: DC | PRN
  Administered 2012-10-27: 12:00:00

## 2012-10-27 MED ORDER — EPINEPHRINE HCL 1 MG/ML IJ SOLN
INTRAMUSCULAR | Status: AC
  Filled 2012-10-27: qty 1

## 2012-10-27 MED ORDER — NEOMYCIN-POLYMYXIN-DEXAMETH 0.1 % OP OINT
TOPICAL_OINTMENT | OPHTHALMIC | Status: DC | PRN
  Administered 2012-10-27: 1 via OPHTHALMIC

## 2012-10-27 MED ORDER — LIDOCAINE 3.5 % OP GEL OPTIME - NO CHARGE
OPHTHALMIC | Status: DC | PRN
  Administered 2012-10-27: 2 [drp] via OPHTHALMIC

## 2012-10-27 MED ORDER — LIDOCAINE HCL (PF) 1 % IJ SOLN
INTRAOCULAR | Status: DC | PRN
  Administered 2012-10-27: 12:00:00 via OPHTHALMIC

## 2012-10-27 MED ORDER — MIDAZOLAM HCL 2 MG/2ML IJ SOLN
1.0000 mg | INTRAMUSCULAR | Status: DC | PRN
  Administered 2012-10-27: 2 mg via INTRAVENOUS
  Administered 2012-10-27: 1 mg via INTRAVENOUS

## 2012-10-27 MED ORDER — MIDAZOLAM HCL 2 MG/2ML IJ SOLN
INTRAMUSCULAR | Status: AC
  Filled 2012-10-27: qty 2

## 2012-10-27 MED ORDER — LACTATED RINGERS IV SOLN
INTRAVENOUS | Status: DC
  Administered 2012-10-27: 12:00:00 via INTRAVENOUS

## 2012-10-27 MED ORDER — TETRACAINE HCL 0.5 % OP SOLN
1.0000 [drp] | OPHTHALMIC | Status: AC
  Administered 2012-10-27 (×3): 1 [drp] via OPHTHALMIC

## 2012-10-27 MED ORDER — PHENYLEPHRINE HCL 2.5 % OP SOLN
1.0000 [drp] | OPHTHALMIC | Status: AC
  Administered 2012-10-27 (×3): 1 [drp] via OPHTHALMIC

## 2012-10-27 MED ORDER — BSS IO SOLN
INTRAOCULAR | Status: DC | PRN
  Administered 2012-10-27: 15 mL via INTRAOCULAR

## 2012-10-27 SURGICAL SUPPLY — 32 items
CAPSULAR TENSION RING-AMO (OPHTHALMIC RELATED) IMPLANT
CLOTH BEACON ORANGE TIMEOUT ST (SAFETY) ×2 IMPLANT
EYE SHIELD UNIVERSAL CLEAR (GAUZE/BANDAGES/DRESSINGS) ×2 IMPLANT
GLOVE BIO SURGEON STRL SZ 6.5 (GLOVE) IMPLANT
GLOVE BIOGEL PI IND STRL 6.5 (GLOVE) ×1 IMPLANT
GLOVE BIOGEL PI IND STRL 7.0 (GLOVE) IMPLANT
GLOVE BIOGEL PI IND STRL 7.5 (GLOVE) IMPLANT
GLOVE BIOGEL PI INDICATOR 6.5 (GLOVE) ×1
GLOVE BIOGEL PI INDICATOR 7.0 (GLOVE)
GLOVE BIOGEL PI INDICATOR 7.5 (GLOVE)
GLOVE ECLIPSE 6.5 STRL STRAW (GLOVE) IMPLANT
GLOVE ECLIPSE 7.0 STRL STRAW (GLOVE) IMPLANT
GLOVE ECLIPSE 7.5 STRL STRAW (GLOVE) IMPLANT
GLOVE EXAM NITRILE LRG STRL (GLOVE) IMPLANT
GLOVE EXAM NITRILE MD LF STRL (GLOVE) ×2 IMPLANT
GLOVE SKINSENSE NS SZ6.5 (GLOVE)
GLOVE SKINSENSE NS SZ7.0 (GLOVE)
GLOVE SKINSENSE STRL SZ6.5 (GLOVE) IMPLANT
GLOVE SKINSENSE STRL SZ7.0 (GLOVE) IMPLANT
KIT VITRECTOMY (OPHTHALMIC RELATED) IMPLANT
LENS SIGHTPATH STAAR TORIC ×2 IMPLANT
PAD ARMBOARD 7.5X6 YLW CONV (MISCELLANEOUS) ×2 IMPLANT
PROC W NO LENS (INTRAOCULAR LENS)
PROC W SPEC LENS (INTRAOCULAR LENS) ×2
PROCESS W NO LENS (INTRAOCULAR LENS) IMPLANT
PROCESS W SPEC LENS (INTRAOCULAR LENS) ×1 IMPLANT
RING MALYGIN (MISCELLANEOUS) IMPLANT
SYR TB 1ML LL NO SAFETY (SYRINGE) ×2 IMPLANT
TAPE SURG TRANSPORE 1 IN (GAUZE/BANDAGES/DRESSINGS) ×1 IMPLANT
TAPE SURGICAL TRANSPORE 1 IN (GAUZE/BANDAGES/DRESSINGS) ×1
VISCOELASTIC ADDITIONAL (OPHTHALMIC RELATED) IMPLANT
WATER STERILE IRR 250ML POUR (IV SOLUTION) ×2 IMPLANT

## 2012-10-27 NOTE — H&P (Signed)
I have reviewed the H&P, the patient was re-examined, and I have identified no interval changes in medical condition and plan of care since the history and physical of record  

## 2012-10-27 NOTE — Anesthesia Preprocedure Evaluation (Signed)
Anesthesia Evaluation  Patient identified by MRN, date of birth, ID band Patient awake    Reviewed: Allergy & Precautions, H&P , NPO status , Patient's Chart, lab work & pertinent test results  Airway Mallampati: II      Dental  (+) Edentulous Upper   Pulmonary  breath sounds clear to auscultation        Cardiovascular hypertension, Pt. on medications Angina:  v. + CAD and + Past MI + dysrhythmias Atrial Fibrillation Rhythm:Regular Rate:Normal     Neuro/Psych Anxiety  Neuromuscular disease (tremors) CVA, Residual Symptoms    GI/Hepatic PUD, GERD-  Medicated,  Endo/Other  diabetes, Type 2, Oral Hypoglycemic Agents  Renal/GU      Musculoskeletal   Abdominal   Peds  Hematology   Anesthesia Other Findings   Reproductive/Obstetrics                           Anesthesia Physical Anesthesia Plan  ASA: III  Anesthesia Plan: MAC   Post-op Pain Management:    Induction: Intravenous  Airway Management Planned: Nasal Cannula  Additional Equipment:   Intra-op Plan:   Post-operative Plan:   Informed Consent: I have reviewed the patients History and Physical, chart, labs and discussed the procedure including the risks, benefits and alternatives for the proposed anesthesia with the patient or authorized representative who has indicated his/her understanding and acceptance.     Plan Discussed with:   Anesthesia Plan Comments:         Anesthesia Quick Evaluation

## 2012-10-27 NOTE — Anesthesia Postprocedure Evaluation (Signed)
  Anesthesia Post-op Note  Patient: [name]  Procedure(s) Performed: Procedure(s) (LRB) with comments: CATARACT EXTRACTION PHACO AND INTRAOCULAR LENS PLACEMENT (IOC) (Left) - CDE 22.67  Patient Location: PACU and Short Stay  Anesthesia Type:MAC  Level of Consciousness: awake, alert  and oriented  Airway and Oxygen Therapy: Patient Spontanous Breathing  Post-op Pain: none  Post-op Assessment: Post-op Vital signs reviewed, Patient's Cardiovascular Status Stable, Respiratory Function Stable, Patent Airway and No signs of Nausea or vomiting  Post-op Vital Signs: Reviewed and stable  Complications: No apparent anesthesia complications

## 2012-10-27 NOTE — Brief Op Note (Signed)
Pre-Op Dx: Cataract OS Post-Op Dx: Cataract OS Surgeon: Gemma Payor Anesthesia: Topical with MAC Surgery: Cataract Extraction with Intraocular lens Implant OS Implant: Staar Toric, [id number], 21.0SE/+2.0 Specimen: None Complications: None

## 2012-10-27 NOTE — Transfer of Care (Signed)
Immediate Anesthesia Transfer of Care Note  Patient: [name]  Procedure(s) Performed: Procedure(s) (LRB) with comments: CATARACT EXTRACTION PHACO AND INTRAOCULAR LENS PLACEMENT (IOC) (Left) - CDE 22.67  Patient Location: PACU and Short Stay  Anesthesia Type:MAC  Level of Consciousness: awake  Airway & Oxygen Therapy: Patient Spontanous Breathing  Post-op Assessment: Report given to PACU RN  Post vital signs: Reviewed  Complications: No apparent anesthesia complications

## 2012-10-28 ENCOUNTER — Other Ambulatory Visit: Payer: Self-pay | Admitting: Gastroenterology

## 2012-10-28 MED ORDER — MESALAMINE ER 0.375 G PO CP24: ORAL_CAPSULE | ORAL | Status: DC

## 2012-10-28 NOTE — Telephone Encounter (Signed)
Prescription sent to patient's pharmacy and pt notified to keep appt for any further refills. Pt agreed.

## 2012-10-28 NOTE — Op Note (Signed)
NAMETEAL, BONTRAGER NO.:  [id number]  MEDICAL RECORD NO.:  [id number]  LOCATION:  [facility]                          FACILITY:  [facility]  PHYSICIAN:  Susanne Greenhouse, MD       DATE OF BIRTH:  1930-04-22  DATE OF PROCEDURE:  10/27/2012 DATE OF DISCHARGE:  10/27/2012                              OPERATIVE REPORT   PREOPERATIVE DIAGNOSIS:  Nuclear cataract, left eye, diagnosis code 366.16, astigmatism left eye, diagnosis code 367.21.  POSTOPERATIVE DIAGNOSIS:  Intraoperative floppy iris syndrome, diagnosis code 364.81.  SURGEON:  Susanne Greenhouse, MD  OPERATION PERFORMED:  Phacoemulsification with posterior chamber intraocular lens implantation, left eye.  ANESTHESIA:  Topical with monitored anesthesia care and IV sedation.  OPERATIVE SUMMARY:  In the preoperative area, dilating drops were placed into the left eye.  The patient was then brought into the operating room where he was placed under topical anesthesia and IV sedation.  The eye was then prepped and draped.  Beginning with a 75 blade, a paracentesis port was made at the surgeon's 2 o'clock position.  The anterior chamber was then filled with a 1% nonpreserved lidocaine solution with epinephrine.  This was followed by Viscoat to deepen the chamber.  A small fornix-based peritomy was performed superiorly.  Next, a single iris hook was placed through the limbus superiorly.  A 2.4-mm keratome blade was then used to make a clear corneal incision over the iris hook. A bent cystotome needle and Utrata forceps were used to create a continuous tear capsulotomy.  Hydrodissection was performed using balanced salt solution on a fine cannula.  The lens nucleus was then removed using phacoemulsification in a quadrant cracking technique.  The cortical material was then removed with irrigation and aspiration.  The capsular bag and anterior chamber were refilled with Provisc.  The wound was widened to approximately 3 mm and a  posterior chamber intraocular lens was placed into the capsular bag without difficulty using an [employer] lens injecting system.  A single 10-0 nylon suture was then used to close the incision as well as stromal hydration.  The Provisc was removed from the anterior chamber and capsular bag with irrigation and aspiration.  At this point, the wounds were tested for leak, which were negative.  The anterior chamber remained deep and stable.  The patient tolerated the procedure well. The Toric intraocular lens was oriented at 158 and 338 degree __________.  There were no complications.  No surgical specimens.  Prosthetic device used is a Star Toric posterior chamber lens, model [id number] power of 21.0 spherical equivalent with a +2 Toric __________ serial number is [id number].          ______________________________ Susanne Greenhouse, MD     KEH/MEDQ  D:  10/27/2012  T:  10/28/2012  Job:  [id number]

## 2012-10-31 ENCOUNTER — Encounter (HOSPITAL_COMMUNITY): Payer: Self-pay | Admitting: Ophthalmology

## 2012-11-03 ENCOUNTER — Ambulatory Visit (INDEPENDENT_AMBULATORY_CARE_PROVIDER_SITE_OTHER): Payer: Medicare Other | Admitting: Gastroenterology

## 2012-11-03 ENCOUNTER — Encounter: Payer: Self-pay | Admitting: Gastroenterology

## 2012-11-03 VITALS — BP 122/72 | HR 88 | Ht 70.0 in | Wt 187.0 lb

## 2012-11-03 DIAGNOSIS — K5150 Left sided colitis without complications: Secondary | ICD-10-CM

## 2012-11-03 DIAGNOSIS — K219 Gastro-esophageal reflux disease without esophagitis: Secondary | ICD-10-CM

## 2012-11-03 DIAGNOSIS — Z7901 Long term (current) use of anticoagulants: Secondary | ICD-10-CM

## 2012-11-03 MED ORDER — MESALAMINE 1.2 G PO TBEC: 1200.0000 mg | DELAYED_RELEASE_TABLET | Freq: Two times a day (BID) | ORAL | Status: DC

## 2012-11-03 NOTE — Progress Notes (Signed)
History of Present Illness: This is an 78 year old male with left sided UC. He has no GI complaints today. GERD and UC well controlled. He had a CVA in May 2013. Least colonoscopy in 06/2011 with minimal left sided colitis activity. BMET 2 weeks ago showed stable renal function.  Current Medications, Allergies, Past Medical History, Past Surgical History, Family History and Social History were reviewed in [employer] record.  Physical Exam: General: Well developed , well nourished, elderly, no acute distress Head: Normocephalic and atraumatic Eyes:  sclerae anicteric, EOMI Ears: Decreased auditory acuity Mouth: No deformity or lesions Lungs: Clear throughout to auscultation Heart: Regular rate and rhythm; no murmurs, rubs or bruits Abdomen: Soft, non tender and non distended. No masses, hepatosplenomegaly or hernias noted. Normal Bowel sounds Musculoskeletal: Symmetrical with no gross deformities  Pulses:  Normal pulses noted Extremities: No clubbing, cyanosis, edema or deformities noted Neurological: Alert oriented x 4, UE resting tremor-R>L Psychological:  Alert and cooperative. Normal mood and affect  Assessment and Recommendations:  1. Long-standing left-sided ulcerative colitis-under excellent control. Apriso is expensive for him and Lialda appears to be better covered so will change to Lialda 1.2g bid. Samples provided. Consider surveillance colonoscopy due in 06/2013 but with his age and comorbidiites will most likely defer future surveillance colonoscopies. Will further discuss at his next office visit in 6 months.  2. Coumadin anticoagulation for afib.   3. CVA in 03/2012.  4. GERD. Continue omeprazole 20 mg daily and standard antireflux measures.

## 2012-11-03 NOTE — Patient Instructions (Addendum)
Finish taking your Apriso prescription before starting Lialda samples one tablet by mouth twice daily.   We have sent the following medications to your pharmacy for you to pick up at your convenience:Lialda  cc: Rudi Heap, MD

## 2012-11-09 ENCOUNTER — Ambulatory Visit (INDEPENDENT_AMBULATORY_CARE_PROVIDER_SITE_OTHER): Payer: Medicare Other | Admitting: Cardiology

## 2012-11-09 ENCOUNTER — Encounter: Payer: Self-pay | Admitting: Cardiology

## 2012-11-09 VITALS — BP 115/70 | HR 94 | Ht 69.0 in | Wt 194.0 lb

## 2012-11-09 DIAGNOSIS — I2510 Atherosclerotic heart disease of native coronary artery without angina pectoris: Secondary | ICD-10-CM

## 2012-11-09 DIAGNOSIS — I4891 Unspecified atrial fibrillation: Secondary | ICD-10-CM

## 2012-11-09 NOTE — Progress Notes (Signed)
HPI The patient returns for follow up of CAD and his stroke earlier this year.  Since I last saw him he has done well.  He is exercising routinely The patient denies any new symptoms such as chest discomfort, neck or arm discomfort. There has been no new shortness of breath, PND or orthopnea. There have been no reported palpitations, presyncope or syncope.   Allergies  Allergen Reactions  . Asa-Apap-Salicyl-Caff     Takes warfarin  . Codeine   . Nsaids Other (See Comments)    On warfarin  . Sulfonamide Derivatives     Current Outpatient Prescriptions  Medication Sig Dispense Refill  . aspirin 81 MG tablet Take 81 mg by mouth daily.        Marland Kitchen atorvastatin (LIPITOR) 40 MG tablet Take 40 mg by mouth at bedtime.       . digoxin (LANOXIN) 0.125 MG tablet Take 125 mcg by mouth daily.        Marland Kitchen glipiZIDE-metformin (METAGLIP) 2.5-500 MG per tablet Take 1 tablet by mouth daily.        . metoprolol (LOPRESSOR) 100 MG tablet Take 150 mg by mouth 2 (two) times daily. One and one half qd      . niacin (NIASPAN) 500 MG CR tablet Take 500 mg by mouth at bedtime.        . nitroGLYCERIN (NITROSTAT) 0.3 MG SL tablet Place 0.3 mg under the tongue every 5 (five) minutes as needed. Never had to use       . omeprazole (PRILOSEC) 20 MG capsule Take 20 mg by mouth daily.       Marland Kitchen PARoxetine (PAXIL) 20 MG tablet Take 30 mg by mouth daily. One tab to one half  qd      . silodosin (RAPAFLO) 8 MG CAPS capsule Take 8 mg by mouth daily.        Marland Kitchen warfarin (COUMADIN) 5 MG tablet Take 5 mg by mouth daily.        Marland Kitchen glipiZIDE (GLUCOTROL XL) 2.5 MG 24 hr tablet Take 2.5 mg by mouth daily.       Current Facility-Administered Medications  Medication Dose Route Frequency Provider Last Rate Last Dose  . 0.9 %  sodium chloride infusion  500 mL Intravenous Continuous Meryl Dare, MD,FACG        Past Medical History  Diagnosis Date  . PAF (paroxysmal atrial fibrillation)   . Dyslipidemia   . Anxiety   . GIB  (gastrointestinal bleeding)   . Obesity   . UC (ulcerative colitis confined to rectum)     Left-sided  . BPH (benign prostatic hyperplasia)   . Diverticulosis   . GERD (gastroesophageal reflux disease)   . Hemorrhoids   . Diabetes mellitus   . Tremor   . CAD (coronary artery disease)     Anterior MI and DES stenting 2004, Vfib arrest; left main 25% stenosis, LAD 80% stenosis, 99% stenosis, 25% circumflex stenosis, 25% ramus intermedius stenosis, 50-40% right coronary artery stenosis, 90% RV branch stenosis with right to left collaterals; EF 55% with mild naterior hypokinesis (underwent Taxus stenting at the time) . Stress perfusion study January 2012 EF 48% with old apical infarct  . Hyperlipidemia   . Hyperplastic colon polyp   . Myocardial infarction   . Hypertension   . Stroke     Past Surgical History  Procedure Date  . Hammer toe surgery     right  . Inguinal hernia repair  right, bilateral  . Coronary angioplasty with stent placement     Taxus stenting  . Cataract extraction w/phaco 10/27/2012    Procedure: CATARACT EXTRACTION PHACO AND INTRAOCULAR LENS PLACEMENT (IOC);  Surgeon: Gemma Payor, MD;  Location: [facility];  Service: Ophthalmology;  Laterality: Left;  CDE 22.67    ROS: As stated in the HPI and negative for all other systems.  PHYSICAL EXAM BP 115/70  Pulse 94  Ht 5\' 9"  (1.753 m)  Wt 194 lb (87.998 kg)  BMI 28.65 kg/m2 GENERAL:  Well appearing NECK:  No jugular venous distention, waveform within normal limits, carotid upstroke brisk and symmetric, no bruits, no thyromegaly LUNGS:  Clear to auscultation bilaterally CHEST:  Unremarkable HEART:  PMI not displaced or sustained,S1 and S2 within normal limits, no S3, no clicks, no rubs, no murmurs, irregular ABD:  Flat, positive bowel sounds normal in frequency in pitch, no bruits, no rebound, no guarding, no midline pulsatile mass, no hepatomegaly, no splenomegaly EXT:  2 plus pulses throughout, mild left ankle  edema, no cyanosis no clubbing NEURO:  Cranial nerves II through XII grossly intact, motor grossly intact throughout, resting tremor   EKG: Atrial fibrillation rate 94, no acute ST T wave changes. 11/09/2012  ASSESSMENT AND PLAN  CAD (coronary artery disease) -  The patient has no new sypmtoms.  No further cardiovascular testing is indicated.  We will continue with aggressive risk reduction and meds as listed.  His last stress test was in 2012  DYSLIPIDEMIA -  This is followed closely by Dr. Christell Constant. No change in therapy is indicated.    FIBRILLATION, ATRIAL -  Mr. OCTAVIANO MUKAI has a CHA2DS2 - VASc score of 7 with a risk of stroke of 9.6%.   His warfarin is followed by his primary providers. He will continue with meds as listed. Because of the CVA earlier this year I will still continue him on aspirin.

## 2012-11-09 NOTE — Patient Instructions (Signed)
Your physician recommends that you continue on your current medications as directed. Please refer to the Current Medication list given to you today.  Your physician recommends that you schedule a follow-up appointment in: 3 MONTHS with Dr Antoine Poche in the [place] office

## 2012-11-10 ENCOUNTER — Encounter: Payer: Self-pay | Admitting: Cardiology

## 2012-11-11 ENCOUNTER — Encounter: Payer: Self-pay | Admitting: Cardiology

## 2012-11-14 ENCOUNTER — Encounter: Payer: Self-pay | Admitting: Cardiology

## 2012-12-05 ENCOUNTER — Emergency Department (HOSPITAL_COMMUNITY)
Admission: EM | Admit: 2012-12-05 | Discharge: 2012-12-06 | Disposition: A | Discharge status: Home / Self Care | Payer: Medicare Other | Attending: Emergency Medicine | Admitting: Emergency Medicine

## 2012-12-05 ENCOUNTER — Encounter (HOSPITAL_COMMUNITY): Payer: Self-pay | Admitting: Emergency Medicine

## 2012-12-05 ENCOUNTER — Emergency Department (HOSPITAL_COMMUNITY): Payer: Medicare Other

## 2012-12-05 DIAGNOSIS — I252 Old myocardial infarction: Secondary | ICD-10-CM | POA: Insufficient documentation

## 2012-12-05 DIAGNOSIS — Z860100 Personal history of colon polyps, unspecified: Secondary | ICD-10-CM | POA: Insufficient documentation

## 2012-12-05 DIAGNOSIS — I2510 Atherosclerotic heart disease of native coronary artery without angina pectoris: Secondary | ICD-10-CM | POA: Insufficient documentation

## 2012-12-05 DIAGNOSIS — K219 Gastro-esophageal reflux disease without esophagitis: Secondary | ICD-10-CM | POA: Insufficient documentation

## 2012-12-05 DIAGNOSIS — Z8679 Personal history of other diseases of the circulatory system: Secondary | ICD-10-CM | POA: Insufficient documentation

## 2012-12-05 DIAGNOSIS — Z7982 Long term (current) use of aspirin: Secondary | ICD-10-CM | POA: Insufficient documentation

## 2012-12-05 DIAGNOSIS — E669 Obesity, unspecified: Secondary | ICD-10-CM | POA: Insufficient documentation

## 2012-12-05 DIAGNOSIS — Z9861 Coronary angioplasty status: Secondary | ICD-10-CM | POA: Insufficient documentation

## 2012-12-05 DIAGNOSIS — F411 Generalized anxiety disorder: Secondary | ICD-10-CM | POA: Insufficient documentation

## 2012-12-05 DIAGNOSIS — Z8669 Personal history of other diseases of the nervous system and sense organs: Secondary | ICD-10-CM | POA: Insufficient documentation

## 2012-12-05 DIAGNOSIS — I10 Essential (primary) hypertension: Secondary | ICD-10-CM | POA: Insufficient documentation

## 2012-12-05 DIAGNOSIS — Z8673 Personal history of transient ischemic attack (TIA), and cerebral infarction without residual deficits: Secondary | ICD-10-CM | POA: Insufficient documentation

## 2012-12-05 DIAGNOSIS — I739 Peripheral vascular disease, unspecified: Secondary | ICD-10-CM | POA: Insufficient documentation

## 2012-12-05 DIAGNOSIS — E119 Type 2 diabetes mellitus without complications: Secondary | ICD-10-CM | POA: Insufficient documentation

## 2012-12-05 DIAGNOSIS — Z7901 Long term (current) use of anticoagulants: Secondary | ICD-10-CM | POA: Insufficient documentation

## 2012-12-05 DIAGNOSIS — G459 Transient cerebral ischemic attack, unspecified: Secondary | ICD-10-CM

## 2012-12-05 DIAGNOSIS — Z79899 Other long term (current) drug therapy: Secondary | ICD-10-CM | POA: Insufficient documentation

## 2012-12-05 DIAGNOSIS — Z8719 Personal history of other diseases of the digestive system: Secondary | ICD-10-CM | POA: Insufficient documentation

## 2012-12-05 DIAGNOSIS — Z87448 Personal history of other diseases of urinary system: Secondary | ICD-10-CM | POA: Insufficient documentation

## 2012-12-05 DIAGNOSIS — E785 Hyperlipidemia, unspecified: Secondary | ICD-10-CM | POA: Insufficient documentation

## 2012-12-05 DIAGNOSIS — Z87891 Personal history of nicotine dependence: Secondary | ICD-10-CM | POA: Insufficient documentation

## 2012-12-05 LAB — URINALYSIS, ROUTINE W REFLEX MICROSCOPIC
Glucose, UA: NEGATIVE mg/dL
Leukocytes, UA: NEGATIVE
Nitrite: NEGATIVE
Specific Gravity, Urine: 1.019 (ref 1.005–1.030)
pH: 5.5 (ref 5.0–8.0)

## 2012-12-05 LAB — POCT I-STAT TROPONIN I: Troponin i, poc: 0 ng/mL (ref 0.00–0.08)

## 2012-12-05 LAB — COMPREHENSIVE METABOLIC PANEL
Albumin: 3.7 g/dL (ref 3.5–5.2)
BUN: 10 mg/dL (ref 6–23)
Calcium: 8.9 mg/dL (ref 8.4–10.5)
Creatinine, Ser: 0.93 mg/dL (ref 0.50–1.35)
Potassium: 4 mEq/L (ref 3.5–5.1)
Total Protein: 7.5 g/dL (ref 6.0–8.3)

## 2012-12-05 LAB — CBC
Hemoglobin: 13.4 g/dL (ref 13.0–17.0)
MCH: 31 pg (ref 26.0–34.0)
MCHC: 32.4 g/dL (ref 30.0–36.0)

## 2012-12-05 LAB — PROTIME-INR: INR: 1.69 — ABNORMAL HIGH (ref 0.00–1.49)

## 2012-12-05 LAB — GLUCOSE, CAPILLARY: Glucose-Capillary: 129 mg/dL — ABNORMAL HIGH (ref 70–99)

## 2012-12-05 LAB — DIFFERENTIAL
Basophils Relative: 1 % (ref 0–1)
Eosinophils Absolute: 0.2 10*3/uL (ref 0.0–0.7)
Eosinophils Relative: 3 % (ref 0–5)
Monocytes Absolute: 0.4 10*3/uL (ref 0.1–1.0)
Monocytes Relative: 8 % (ref 3–12)
Neutrophils Relative %: 63 % (ref 43–77)

## 2012-12-05 NOTE — ED Provider Notes (Addendum)
History     CSN: [id number]  Arrival date & time 12/05/12  2204   First MD Initiated Contact with Patient 12/05/12 2207      Chief Complaint  Patient presents with  . Aphasia    (Consider location/radiation/quality/duration/timing/severity/associated sxs/prior treatment) HPI Comments: [name] is a 79 y.o. Male who presents for evaluation of slurred speech. Slurred speech started about one half an hour ago. It is improving. He has had this before, when he "had a stroke." He denies headache, weakness, dizziness, nausea, or vomiting. He has not angulated since onset of the symptom. He presents by EMS, for evaluation. He is not taking any medication. There are no modifying factors.  The history is provided by the patient.    Past Medical History  Diagnosis Date  . PAF (paroxysmal atrial fibrillation)   . Dyslipidemia   . Anxiety   . GIB (gastrointestinal bleeding)   . Obesity   . UC (ulcerative colitis confined to rectum)     Left-sided  . BPH (benign prostatic hyperplasia)   . Diverticulosis   . GERD (gastroesophageal reflux disease)   . Hemorrhoids   . Diabetes mellitus   . Tremor   . CAD (coronary artery disease)     Anterior MI and DES stenting 2004, Vfib arrest; left main 25% stenosis, LAD 80% stenosis, 99% stenosis, 25% circumflex stenosis, 25% ramus intermedius stenosis, 50-40% right coronary artery stenosis, 90% RV branch stenosis with right to left collaterals; EF 55% with mild naterior hypokinesis (underwent Taxus stenting at the time) . Stress perfusion study January 2012 EF 48% with old apical infarct  . Hyperlipidemia   . Hyperplastic colon polyp   . Myocardial infarction   . Hypertension   . Stroke     Past Surgical History  Procedure Date  . Hammer toe surgery     right  . Inguinal hernia repair     right, bilateral  . Coronary angioplasty with stent placement     Taxus stenting  . Cataract extraction w/phaco 10/27/2012    Procedure: CATARACT  EXTRACTION PHACO AND INTRAOCULAR LENS PLACEMENT (IOC);  Surgeon: Gemma Payor, MD;  Location: [facility];  Service: Ophthalmology;  Laterality: Left;  CDE 22.67    Family History  Problem Relation Age of Onset  . Colon cancer Neg Hx   . Alzheimer's disease Brother   . Diabetes Brother     x 4  . Diabetes Mother     ?  . Diabetes Father   . Heart disease Father   . Heart disease Paternal Grandfather   . Heart disease Brother     x 3    History  Substance Use Topics  . Smoking status: Former Smoker    Quit date: 11/23/1958  . Smokeless tobacco: Former Neurosurgeon     Comment: Quit years ago  . Alcohol Use: No      Review of Systems  All other systems reviewed and are negative.    Allergies  Asa-apap-salicyl-caff; Codeine; Nsaids; and Sulfonamide derivatives  Home Medications   Current Outpatient Rx  Name  Route  Sig  Dispense  Refill  . ASPIRIN EC 81 MG PO TBEC   Oral   Take 81 mg by mouth daily.         . ATORVASTATIN CALCIUM 40 MG PO TABS   Oral   Take 40 mg by mouth at bedtime.          Marland Kitchen DIGOXIN 0.125 MG PO TABS   Oral  Take 0.125 mg by mouth daily.          Marland Kitchen GLIPIZIDE ER 2.5 MG PO TB24   Oral   Take 2.5 mg by mouth daily.         Marland Kitchen GLIPIZIDE-METFORMIN HCL 2.5-500 MG PO TABS   Oral   Take 1 tablet by mouth daily.           Marland Kitchen MESALAMINE 1.2 G PO TBEC   Oral   Take 1,200 mg by mouth 2 (two) times daily.         Marland Kitchen METOPROLOL TARTRATE 100 MG PO TABS   Oral   Take 150 mg by mouth 2 (two) times daily.          Marland Kitchen NIACIN ER (ANTIHYPERLIPIDEMIC) 500 MG PO TBCR   Oral   Take 500 mg by mouth at bedtime.           Marland Kitchen NITROGLYCERIN 0.3 MG SL SUBL   Sublingual   Place 0.3 mg under the tongue every 5 (five) minutes as needed. Never had to use          . OMEPRAZOLE 20 MG PO CPDR   Oral   Take 20 mg by mouth daily.          Marland Kitchen PAROXETINE HCL 20 MG PO TABS   Oral   Take 30 mg by mouth daily.          Marland Kitchen SILODOSIN 8 MG PO CAPS   Oral    Take 8 mg by mouth daily.           . WARFARIN SODIUM 5 MG PO TABS   Oral   Take 2.5-5 mg by mouth daily. 1 tablet (5 mg) on Monday, Friday and Saturday, 1/2 tablet (2.5 mg) on Sunday, Tuesday, Wednesday, Thursday           BP 134/83  Pulse 89  Resp 21  SpO2 97%  Physical Exam  Nursing note and vitals reviewed. Constitutional: He is oriented to person, place, and time. He appears well-developed and well-nourished.  HENT:  Head: Normocephalic and atraumatic.  Right Ear: External ear normal.  Left Ear: External ear normal.  Eyes: Conjunctivae normal and EOM are normal. Pupils are equal, round, and reactive to light.  Neck: Normal range of motion and phonation normal. Neck supple.  Cardiovascular: Normal rate, regular rhythm, normal heart sounds and intact distal pulses.   Pulmonary/Chest: Effort normal and breath sounds normal. He exhibits no bony tenderness.  Abdominal: Soft. Normal appearance. There is no tenderness.  Musculoskeletal: Normal range of motion.  Neurological: He is alert and oriented to person, place, and time. He has normal strength. No cranial nerve deficit or sensory deficit. He exhibits normal muscle tone. Coordination normal.       Rhythmic tremor, bilaterally; improves with intention. No dysarthria, or aphasia.  Skin: Skin is warm, dry and intact.  Psychiatric: He has a normal mood and affect. His behavior is normal. Judgment and thought content normal.    ED Course  Procedures (including critical care time)  Reevaluation:00:05- patient continues to be asymptomatic. He is with his friend, a Education officer, environmental. The patient describes having a TIA, and was evaluated and treated at [facility], [facility] in May 2013. Patient describes having carotid Doppler, and cardiac echo. He did not have a MRI, due to the intolerance of the procedure. Since then he has followup with his neurologist twice. He is on Coumadin, for atrial fibrillation. He takes his dose in the  morning.    Date: 09/09/2012  Rate: 95  Rhythm: atrial fibrillation and premature ventricular contractions (PVC)  QRS Axis: normal  PR and QT Intervals: normal QT  ST/T Wave abnormalities: normal  PR and QRS Conduction Disutrbances:normal QT  Narrative Interpretation:   Old EKG Reviewed: unchanged      Labs Reviewed  PROTIME-INR - Abnormal; Notable for the following:    Prothrombin Time 19.3 (*)     INR 1.69 (*)     All other components within normal limits  APTT - Abnormal; Notable for the following:    aPTT 44 (*)     All other components within normal limits  CBC - Abnormal; Notable for the following:    Platelets 142 (*)     All other components within normal limits  COMPREHENSIVE METABOLIC PANEL - Abnormal; Notable for the following:    Glucose, Bld 131 (*)     GFR calc non Af Amer 77 (*)     GFR calc Af Amer 90 (*)     All other components within normal limits  URINALYSIS, ROUTINE W REFLEX MICROSCOPIC - Abnormal; Notable for the following:    APPearance CLOUDY (*)     All other components within normal limits  GLUCOSE, CAPILLARY - Abnormal; Notable for the following:    Glucose-Capillary 129 (*)     All other components within normal limits  ETHANOL  DIFFERENTIAL  POCT I-STAT TROPONIN I   Ct Head Wo Contrast  12/05/2012  *RADIOLOGY REPORT*  Clinical Data: A patient with symptoms beginning on proximally 2140 tonight.  Symptoms subsequently resolving.  CT HEAD WITHOUT CONTRAST  Technique:  Contiguous axial images were obtained from the base of the skull through the vertex without contrast.  Comparison: 03/21/2012  Findings: Diffuse cerebral atrophy.  Ventricular dilatation consistent with central atrophy.  Low attenuation changes in the deep white matter consistent with small vessel ischemia.  No mass effect or midline shift.  No abnormal extra-axial fluid collections.  Gray-white matter junctions are distinct.  Basal cisterns are not effaced.  No evidence of acute  intracranial hemorrhage. Vascular calcifications. Calcifications in the basal ganglia.  No depressed skull fractures.  Visualized paranasal sinuses and mastoid air cells are not opacified.  No significant changes since the previous study.  IMPRESSION: No acute intracranial abnormalities.  Chronic atrophy and small vessel ischemic changes.   Original Report Authenticated By: Burman Nieves, M.D.    Nursing notes, applicable records and vitals reviewed.  Radiologic Images/Reports reviewed.   1. TIA (transient ischemic attack)       MDM  Evaluation is consistent with TIA, with rapid resolution. Patient has been recently evaluated for stroke, like symptoms. He is on Coumadin. He is stable for discharge with outpatient management.     Plan: Home Medications- usual; Home Treatments- rest; Recommended follow up- Neurology in 1-2 weeks, PCP 1 week     Flint Melter, MD 12/06/12 0023  Flint Melter, MD 12/06/12 [phone]

## 2012-12-05 NOTE — ED Notes (Signed)
Lab at bedside

## 2012-12-05 NOTE — ED Notes (Signed)
Per EMS, Pt started having trouble forming words approx 2140 tonight. Per family and friends pt has trouble with words from previous CVA, but stated it was a little different tonight. Pt states that symptoms were resolving by the time EMS arrived at facility. CBG 146, Bp 141/97, Hr 95, 100% RA. A/o x4. Family not at bedside at this time. Neuro assessment WDL other than aphasia. Pt has Parkinsons. 20G in R Forearm.

## 2012-12-05 NOTE — ED Notes (Signed)
Patient transported to CT 

## 2012-12-06 NOTE — ED Notes (Signed)
Stroke swallow screen was completed, just not charted.

## 2012-12-19 ENCOUNTER — Telehealth: Payer: Self-pay | Admitting: Gastroenterology

## 2012-12-19 MED ORDER — MESALAMINE 1.2 G PO TBEC: 1200.0000 mg | DELAYED_RELEASE_TABLET | Freq: Two times a day (BID) | ORAL | Status: DC

## 2012-12-19 NOTE — Telephone Encounter (Signed)
Prescription sent to patient's pharmacy.

## 2013-01-16 DIAGNOSIS — I679 Cerebrovascular disease, unspecified: Secondary | ICD-10-CM | POA: Insufficient documentation

## 2013-01-19 DIAGNOSIS — R259 Unspecified abnormal involuntary movements: Secondary | ICD-10-CM | POA: Insufficient documentation

## 2013-01-19 DIAGNOSIS — F079 Unspecified personality and behavioral disorder due to known physiological condition: Secondary | ICD-10-CM | POA: Insufficient documentation

## 2013-02-02 ENCOUNTER — Other Ambulatory Visit: Payer: Self-pay | Admitting: Neurology

## 2013-02-08 ENCOUNTER — Ambulatory Visit (INDEPENDENT_AMBULATORY_CARE_PROVIDER_SITE_OTHER): Payer: Medicare Other | Admitting: Cardiology

## 2013-02-08 ENCOUNTER — Encounter: Payer: Self-pay | Admitting: Cardiology

## 2013-02-08 VITALS — BP 119/72 | HR 98 | Ht 72.0 in | Wt 180.0 lb

## 2013-02-08 NOTE — Patient Instructions (Addendum)
The current medical regimen is effective;  continue present plan and medications.  Follow up in 4 months with Dr Hochrein.  You will receive a letter in the mail 2 months before you are due.  Please call us when you receive this letter to schedule your follow up appointment.  

## 2013-02-08 NOTE — Progress Notes (Signed)
HPI The patient returns for follow up of CAD and his stroke last year.  Since I last saw him he has done OK.  He did have a probable TIA in January and is due to see [name].  He is exercising routinely The patient denies any new symptoms such as chest discomfort, neck or arm discomfort. There has been no new shortness of breath, PND or orthopnea. There have been no reported palpitations, presyncope or syncope.   Allergies  Allergen Reactions  . Asa-Apap-Salicyl-Caff     Takes warfarin  . Codeine Other (See Comments)    Unknown reaction  . Nsaids Other (See Comments)    On warfarin  . Sulfonamide Derivatives Nausea And Vomiting    Current Outpatient Prescriptions  Medication Sig Dispense Refill  . aspirin EC 81 MG tablet Take 81 mg by mouth daily.      Marland Kitchen atorvastatin (LIPITOR) 40 MG tablet Take 40 mg by mouth at bedtime.       . digoxin (LANOXIN) 0.125 MG tablet Take 0.125 mg by mouth daily.       Marland Kitchen donepezil (ARICEPT) 5 MG tablet Take 5 mg by mouth at bedtime as needed.      Marland Kitchen glipiZIDE-metformin (METAGLIP) 2.5-500 MG per tablet Take 1 tablet by mouth daily.        . mesalamine (LIALDA) 1.2 G EC tablet Take 1 tablet (1.2 g total) by mouth 2 (two) times daily.  60 tablet  4  . metoprolol (LOPRESSOR) 100 MG tablet Take 150 mg by mouth 2 (two) times daily.       . niacin (NIASPAN) 500 MG CR tablet Take 500 mg by mouth at bedtime.        . nitroGLYCERIN (NITROSTAT) 0.3 MG SL tablet Place 0.3 mg under the tongue every 5 (five) minutes as needed. Never had to use       . omeprazole (PRILOSEC) 20 MG capsule Take 20 mg by mouth daily.       Marland Kitchen PARoxetine (PAXIL) 20 MG tablet Take 30 mg by mouth daily.       . silodosin (RAPAFLO) 8 MG CAPS capsule Take 8 mg by mouth daily.        Carlena Hurl 20 MG TABS        Current Facility-Administered Medications  Medication Dose Route Frequency Provider Last Rate Last Dose  . 0.9 %  sodium chloride infusion  500 mL Intravenous Continuous Meryl Dare, MD        Past Medical History  Diagnosis Date  . PAF (paroxysmal atrial fibrillation)   . Dyslipidemia   . Anxiety   . GIB (gastrointestinal bleeding)   . Obesity   . UC (ulcerative colitis confined to rectum)     Left-sided  . BPH (benign prostatic hyperplasia)   . Diverticulosis   . GERD (gastroesophageal reflux disease)   . Hemorrhoids   . Diabetes mellitus   . Tremor   . CAD (coronary artery disease)     Anterior MI and DES stenting 2004, Vfib arrest; left main 25% stenosis, LAD 80% stenosis, 99% stenosis, 25% circumflex stenosis, 25% ramus intermedius stenosis, 50-40% right coronary artery stenosis, 90% RV branch stenosis with right to left collaterals; EF 55% with mild naterior hypokinesis (underwent Taxus stenting at the time) . Stress perfusion study January 2012 EF 48% with old apical infarct  . Hyperlipidemia   . Hyperplastic colon polyp   . Myocardial infarction   . Hypertension   .  Stroke     Past Surgical History  Procedure Laterality Date  . Hammer toe surgery      right  . Inguinal hernia repair      right, bilateral  . Coronary angioplasty with stent placement      Taxus stenting  . Cataract extraction w/phaco  10/27/2012    Procedure: CATARACT EXTRACTION PHACO AND INTRAOCULAR LENS PLACEMENT (IOC);  Surgeon: Gemma Payor, MD;  Location: [facility];  Service: Ophthalmology;  Laterality: Left;  CDE 22.67    ROS: As stated in the HPI and negative for all other systems.  PHYSICAL EXAM There were no vitals taken for this visit. GENERAL:  Well appearing NECK:  No jugular venous distention, waveform within normal limits, carotid upstroke brisk and symmetric, no bruits, no thyromegaly LUNGS:  Clear to auscultation bilaterally CHEST:  Unremarkable HEART:  PMI not displaced or sustained,S1 and S2 within normal limits, no S3, no clicks, no rubs, no murmurs, irregular ABD:  Flat, positive bowel sounds normal in frequency in pitch, no bruits, no rebound, no  guarding, no midline pulsatile mass, no hepatomegaly, no splenomegaly EXT:  2 plus pulses throughout, mild left ankle edema, no cyanosis no clubbing NEURO:  Cranial nerves II through XII grossly intact, motor grossly intact throughout, resting tremor   ASSESSMENT AND PLAN  CAD (coronary artery disease) -  The patient has no new sypmtoms.  No further cardiovascular testing is indicated.  We will continue with aggressive risk reduction and meds as listed.  His last stress test was in 2012  DYSLIPIDEMIA -  This is followed closely by Dr. Christell Constant. No change in therapy is indicated.    FIBRILLATION, ATRIAL -  Mr. ELMAR ANTIGUA has a CHA2DS2 - VASc score of 7 with a risk of stroke of 9.6%.   He tolerates Xarelto.   He will continue with meds as listed. Because of the CVA last year and TIA in January he will remain on ASA.

## 2013-02-09 ENCOUNTER — Ambulatory Visit (INDEPENDENT_AMBULATORY_CARE_PROVIDER_SITE_OTHER): Payer: Medicare Other

## 2013-02-15 ENCOUNTER — Other Ambulatory Visit: Payer: Self-pay | Admitting: Family Medicine

## 2013-02-16 ENCOUNTER — Encounter: Payer: Self-pay | Admitting: Family Medicine

## 2013-02-16 ENCOUNTER — Ambulatory Visit (INDEPENDENT_AMBULATORY_CARE_PROVIDER_SITE_OTHER): Payer: Medicare Other | Admitting: Family Medicine

## 2013-02-16 VITALS — BP 131/84 | HR 89 | Temp 98.1°F | Ht 70.0 in | Wt 181.0 lb

## 2013-02-16 DIAGNOSIS — E876 Hypokalemia: Secondary | ICD-10-CM

## 2013-02-16 DIAGNOSIS — K219 Gastro-esophageal reflux disease without esophagitis: Secondary | ICD-10-CM

## 2013-02-16 DIAGNOSIS — I2510 Atherosclerotic heart disease of native coronary artery without angina pectoris: Secondary | ICD-10-CM

## 2013-02-16 DIAGNOSIS — E559 Vitamin D deficiency, unspecified: Secondary | ICD-10-CM

## 2013-02-16 DIAGNOSIS — Z79899 Other long term (current) drug therapy: Secondary | ICD-10-CM

## 2013-02-16 DIAGNOSIS — E785 Hyperlipidemia, unspecified: Secondary | ICD-10-CM

## 2013-02-16 DIAGNOSIS — E119 Type 2 diabetes mellitus without complications: Secondary | ICD-10-CM

## 2013-02-16 LAB — POCT CBC
Granulocyte percent: 67.9 %G (ref 37–80)
HCT, POC: 38.9 % — AB (ref 43.5–53.7)
Hemoglobin: 13.3 g/dL — AB (ref 14.1–18.1)
Lymph, poc: 1.6 (ref 0.6–3.4)
MCH, POC: 32.3 pg — AB (ref 27–31.2)
MCHC: 34.1 g/dL (ref 31.8–35.4)
MCV: 94.6 fL (ref 80–97)
MID (cbc): 0 (ref 0–0.9)
MPV: 8.2 fL (ref 0–99.8)
POC Granulocyte: 4.2 (ref 2–6.9)
POC LYMPH PERCENT: 25.1 %L (ref 10–50)
POC MID %: 0 %M (ref 0–12)
Platelet Count, POC: 134 10*3/uL — AB (ref 142–424)
RBC: 4.1 M/uL — AB (ref 4.69–6.13)
RDW, POC: 13.7 %
WBC: 6.2 10*3/uL (ref 4.6–10.2)

## 2013-02-16 LAB — POCT GLYCOSYLATED HEMOGLOBIN (HGB A1C): Hemoglobin A1C: 5.8

## 2013-02-16 NOTE — Assessment & Plan Note (Signed)
R. Dr. Antoine Poche last week and no changes were made in medication

## 2013-02-16 NOTE — Progress Notes (Signed)
  Subjective:    Patient ID: [name], male    DOB: 1930-10-15, 79 y.o.   MRN: [id number]  HPI This patient presents for recheck of multiple medical problems. No one accompanies the patient today.  Patient Active Problem List  Diagnosis  . DYSLIPIDEMIA  . CAD  . FIBRILLATION, ATRIAL  . GERD  . ULCERATIVE COLITIS-LEFT SIDE  . ULCERATIVE COLITIS  . BPH (benign prostatic hypertrophy)  . Premature atrial complexes  . Panic attack  . Diabetes mellitus  . CAD (coronary artery disease)  . PAF (paroxysmal atrial fibrillation)  . Dyslipidemia      The allergies, current medications, past medical history, surgical history, family and social history are reviewed.  Immunizations reviewed.  Health maintenance reviewed.  no items are outstanding:       Review of Systems  HENT: Positive for hearing loss.   Respiratory: Negative.   Cardiovascular: Positive for leg swelling (R).  Gastrointestinal: Positive for diarrhea (occasional).  Genitourinary: Positive for frequency (2-3 x at night).  Neurological: Positive for tremors and facial asymmetry.  Psychiatric/Behavioral: Negative.        Objective:   Physical Exam  BP 131/84  Pulse 89  Temp(Src) 98.1 F (36.7 C) (Oral)  Ht 5\' 10"  (1.778 m)  Wt 181 lb (82.101 kg)  BMI 25.97 kg/m2  The patient appeared well nourished and normally developed, alert and oriented to time and place. Speech, behavior and judgement appear normal. Vital signs as documented.  Head exam is unremarkable. No scleral icterus or pallor noted. Hearing aids bilaterally. Ear canals are clear Neck is without jugular venous distension, thyromegally, or carotid bruits. Carotid upstrokes are brisk bilaterally. No cervical adenopathy. Lungs are clear anteriorly and posteriorly to auscultation. Normal respiratory effort. Cardiac exam reveals regular rate and rhythm 96/min. First and second heart sounds normal. No murmurs, rubs or gallops.  Abdominal exam  reveals normal bowl sounds, no masses, no organomegaly and no aortic enlargement. No inguinal adenopathy.Slight epigastric tenderness. Extremities are nonedematous and both femoral and pedal pulses are normal. Skin without pallor or jaundice.  Warm and dry, without rash. Neurologic exam reveals normal deep tendon reflexes and normal sensation.         Assessment & Plan:  DYSLIPIDEMIA R. Dr. Antoine Poche last week and no changes were made in medication  GERD Doing well with reflux disorder, watch his diet and takes no medication for thist  Diabetes mellitus Continue medicines as doing for diabetes keep feet checked regularly Continue therapeutic lifestyle to

## 2013-02-16 NOTE — Assessment & Plan Note (Signed)
Continue medicines as doing for diabetes keep feet checked regularly Continue therapeutic lifestyle to

## 2013-02-16 NOTE — Patient Instructions (Addendum)
Continue current meds and therapeutic lifestyle changes Fall precautions discussed Call your pharmacy for chronic medication refills

## 2013-02-16 NOTE — Assessment & Plan Note (Signed)
Doing well with reflux disorder, watch his diet and takes no medication for thist

## 2013-02-17 LAB — BASIC METABOLIC PANEL WITH GFR
BUN: 10 mg/dL (ref 6–23)
CO2: 31 mEq/L (ref 19–32)
Calcium: 9 mg/dL (ref 8.4–10.5)
GFR, Est African American: 84 mL/min
Glucose, Bld: 79 mg/dL (ref 70–99)

## 2013-02-17 LAB — THYROID PANEL WITH TSH
T3 Uptake: 35.8 % (ref 22.5–37.0)
T4, Total: 7.3 ug/dL (ref 5.0–12.5)

## 2013-02-17 LAB — LIPID PANEL
LDL Cholesterol: 53 mg/dL (ref 0–99)
Total CHOL/HDL Ratio: 2.6 Ratio
Triglycerides: 65 mg/dL (ref <150)
VLDL: 13 mg/dL (ref 0–40)

## 2013-02-17 LAB — HEPATIC FUNCTION PANEL
ALT: 13 U/L (ref 0–53)
Total Protein: 6.7 g/dL (ref 6.0–8.3)

## 2013-03-06 ENCOUNTER — Ambulatory Visit (INDEPENDENT_AMBULATORY_CARE_PROVIDER_SITE_OTHER): Payer: Medicare Other | Admitting: Pharmacist

## 2013-03-06 NOTE — Progress Notes (Signed)
Patient ID: [name], male   DOB: [birth year], 79 y.o.   MRN: [id number]  Erroneous entry Patient not seen today

## 2013-03-13 ENCOUNTER — Ambulatory Visit: Payer: Self-pay

## 2013-03-14 ENCOUNTER — Other Ambulatory Visit: Payer: Self-pay

## 2013-03-14 MED ORDER — ATORVASTATIN CALCIUM 40 MG PO TABS: 40.0000 mg | ORAL_TABLET | Freq: Every day | ORAL | Status: DC

## 2013-03-20 ENCOUNTER — Ambulatory Visit (INDEPENDENT_AMBULATORY_CARE_PROVIDER_SITE_OTHER): Payer: Medicare Other | Admitting: Pharmacist

## 2013-03-20 DIAGNOSIS — E119 Type 2 diabetes mellitus without complications: Secondary | ICD-10-CM

## 2013-03-20 NOTE — Progress Notes (Signed)
Filled patient's wife's pill boxes

## 2013-03-28 ENCOUNTER — Ambulatory Visit (INDEPENDENT_AMBULATORY_CARE_PROVIDER_SITE_OTHER): Payer: Medicare Other | Admitting: Gastroenterology

## 2013-03-28 ENCOUNTER — Encounter: Payer: Self-pay | Admitting: Gastroenterology

## 2013-03-28 VITALS — BP 132/76 | HR 84 | Ht 69.0 in | Wt 181.0 lb

## 2013-03-28 DIAGNOSIS — R197 Diarrhea, unspecified: Secondary | ICD-10-CM

## 2013-03-28 DIAGNOSIS — K5130 Ulcerative (chronic) rectosigmoiditis without complications: Secondary | ICD-10-CM

## 2013-03-28 NOTE — Patient Instructions (Addendum)
Stop taking your Metaglip for 5 days and see if that helps with your diarrhea symptoms. Discuss alternative medications with your Primary Care physician if that is the cause of your diarrhea.  We have cancelled you Recall Colonoscopy.

## 2013-03-28 NOTE — Progress Notes (Signed)
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 He relates occasional episodes of urgent postprandial loose stools with several episodes of incontinence. He generally has 1 or 2 bowel movements per day. His symptoms occur sporadically. He is recently been changed from Coumadin to Xarelto.   Current Medications, Allergies, Past Medical History, Past Surgical History, Family History and Social History were reviewed in [employer] record.  Physical Exam: General: Well developed , well nourished, no acute distress Head: Normocephalic and atraumatic Eyes:  sclerae anicteric, EOMI Ears: Decreased auditory acuity Mouth: No deformity or lesions Lungs: Clear throughout to auscultation Heart: Regular rate and rhythm; no murmurs, rubs or bruits Abdomen: Soft, non tender and non distended. No masses, hepatosplenomegaly or hernias noted. Normal Bowel sounds Musculoskeletal: Symmetrical with no gross deformities  Pulses:  Normal pulses noted Extremities: No clubbing, cyanosis, edema or deformities noted Neurological: Alert oriented x 4, left upper extremity tremor Psychological:  Alert and cooperative. Normal mood and affect  Assessment and Recommendations:  1. Left-sided ulcerative colitis. Occasional diarrhea with occasional incontinence. Continue Lialda 1.2g twice a day. Trial of discontinuing metformin. If this does not resolve his diarrhea, urgency and incontinence he is to return for further follow up. Given his recent CVA and TIA he has a high risk for holding anticoagulants to allow for colonoscopy therefore we will plan to defer screening and surveillance colonoscopies.

## 2013-04-03 ENCOUNTER — Encounter: Payer: Self-pay | Admitting: Pharmacist Clinician (PhC)/ Clinical Pharmacy Specialist

## 2013-04-14 ENCOUNTER — Other Ambulatory Visit: Payer: Self-pay | Admitting: Family Medicine

## 2013-04-27 ENCOUNTER — Other Ambulatory Visit: Payer: Self-pay | Admitting: Gastroenterology

## 2013-05-05 ENCOUNTER — Encounter: Payer: Self-pay | Admitting: Neurology

## 2013-05-05 ENCOUNTER — Ambulatory Visit (INDEPENDENT_AMBULATORY_CARE_PROVIDER_SITE_OTHER): Payer: Medicare Other | Admitting: Neurology

## 2013-05-05 VITALS — BP 130/83 | HR 81 | Ht 70.75 in | Wt 183.0 lb

## 2013-05-05 DIAGNOSIS — R259 Unspecified abnormal involuntary movements: Secondary | ICD-10-CM

## 2013-05-05 DIAGNOSIS — R413 Other amnesia: Secondary | ICD-10-CM

## 2013-05-05 DIAGNOSIS — G250 Essential tremor: Secondary | ICD-10-CM

## 2013-05-05 DIAGNOSIS — F079 Unspecified personality and behavioral disorder due to known physiological condition: Secondary | ICD-10-CM

## 2013-05-05 DIAGNOSIS — I679 Cerebrovascular disease, unspecified: Secondary | ICD-10-CM

## 2013-05-05 HISTORY — DX: Essential tremor: G25.0

## 2013-05-05 HISTORY — DX: Other amnesia: R41.3

## 2013-05-05 NOTE — Progress Notes (Signed)
Reason for visit: Memory disturbance, tremor  [name] is an 79 y.o. male  History of present illness:  [name] is an 79 year old left-handed white male with a history of a progressive memory disturbance. The patient was placed on Aricept, but he developed diarrhea, and this necessitated cessation of this medication. The patient has had resolution of the diarrhea, and he has ongoing problems with tremor that mainly affects the jaw and the left greater than right upper extremities. The patient denies any gait disturbance, or recent falls. The patient returns to this office for an evaluation.  Past Medical History  Diagnosis Date  . PAF (paroxysmal atrial fibrillation)   . Dyslipidemia   . Anxiety   . GIB (gastrointestinal bleeding)   . Obesity   . UC (ulcerative colitis confined to rectum)     Left-sided  . BPH (benign prostatic hyperplasia)   . Diverticulosis   . GERD (gastroesophageal reflux disease)   . Hemorrhoids   . Diabetes mellitus   . Tremor   . CAD (coronary artery disease)     Anterior MI and DES stenting 2004, Vfib arrest; left main 25% stenosis, LAD 80% stenosis, 99% stenosis, 25% circumflex stenosis, 25% ramus intermedius stenosis, 50-40% right coronary artery stenosis, 90% RV branch stenosis with right to left collaterals; EF 55% with mild naterior hypokinesis (underwent Taxus stenting at the time) . Stress perfusion study January 2012 EF 48% with old apical infarct  . Hyperlipidemia   . Hyperplastic colon polyp   . Myocardial infarction   . Hypertension   . Stroke   . Memory deficits 05/05/2013  . Essential and other specified forms of tremor 05/05/2013    Past Surgical History  Procedure Laterality Date  . Hammer toe surgery      right  . Inguinal hernia repair      right, bilateral  . Coronary angioplasty with stent placement      Taxus stenting  . Cataract extraction w/phaco  10/27/2012    Procedure: CATARACT EXTRACTION PHACO AND INTRAOCULAR LENS  PLACEMENT (IOC);  Surgeon: Gemma Payor, MD;  Location: [facility];  Service: Ophthalmology;  Laterality: Left;  CDE 22.67    Family History  Problem Relation Age of Onset  . Colon cancer Neg Hx   . Alzheimer's disease Brother   . Diabetes Brother     x 4  . Diabetes Mother     ?  . Diabetes Father   . Heart disease Father   . Heart disease Paternal Grandfather   . Heart disease Brother     x 3  . COPD Sister   . Diabetes Sister     Social history:  reports that he quit smoking about 54 years ago. He has quit using smokeless tobacco. He reports that he does not drink alcohol or use illicit drugs.  Allergies:  Allergies  Allergen Reactions  . Asa-Apap-Salicyl-Caff     Takes warfarin  . Codeine Other (See Comments)    Unknown reaction  . Nsaids Other (See Comments)    On warfarin  . Sulfonamide Derivatives Nausea And Vomiting    Medications:  Current Outpatient Prescriptions on File Prior to Visit  Medication Sig Dispense Refill  . aspirin EC 81 MG tablet Take 81 mg by mouth daily.      Marland Kitchen atorvastatin (LIPITOR) 40 MG tablet TAKE 1 TABLET (40 MG TOTAL) BY MOUTH AT BEDTIME.  30 tablet  3  . digoxin (LANOXIN) 0.125 MG tablet Take 0.125 mg by  mouth daily.       Marland Kitchen LIALDA 1.2 G EC tablet TAKE 1 TABLET (1.2 G TOTAL) BY MOUTH 2 (TWO) TIMES DAILY.  60 tablet  4  . metoprolol (LOPRESSOR) 100 MG tablet TAKE ONE AND A HALF TABLETS BY MOUTH TWICE DAILY  90 tablet  3  . niacin (NIASPAN) 500 MG CR tablet Take 500 mg by mouth at bedtime.        . nitroGLYCERIN (NITROSTAT) 0.3 MG SL tablet Place 0.3 mg under the tongue every 5 (five) minutes as needed. Never had to use       . omeprazole (PRILOSEC) 20 MG capsule Take 20 mg by mouth daily.       Marland Kitchen PARoxetine (PAXIL) 20 MG tablet Take 30 mg by mouth daily.       . potassium chloride SA (K-DUR,KLOR-CON) 20 MEQ tablet Take 10 mEq by mouth daily.      Carlena Hurl 20 MG TABS Take 20 mg by mouth daily.       . silodosin (RAPAFLO) 8 MG CAPS capsule Take  8 mg by mouth daily.         Current Facility-Administered Medications on File Prior to Visit  Medication Dose Route Frequency Provider Last Rate Last Dose  . 0.9 %  sodium chloride infusion  500 mL Intravenous Continuous Meryl Dare, MD        ROS:  Out of a complete 14 system review of symptoms, the patient complains only of the following symptoms, and all other reviewed systems are negative.  Hearing loss, ringing in the ears Easy bruising, easy bleeding Allergies, runny nose Memory loss, tremor  Blood pressure 130/83, pulse 81, height 5' 10.75" (1.797 m), weight 183 lb (83.008 kg).  Physical Exam  General: The patient is alert and cooperative at the time of the examination.  Skin: No significant peripheral edema is noted.   Neurologic Exam  Mental status: Mini-Mental status examination done today shows a total score of 23/30. The patient is able to name 9 animals in 60 seconds.  Cranial nerves: Facial symmetry is present. Speech is normal, no aphasia or dysarthria is noted. Extraocular movements are full. Visual fields are full. A tremor affecting the jaw is noted.  Motor: The patient has good strength in all 4 extremities.  Coordination: The patient has good finger-nose-finger and heel-to-shin bilaterally. The patient has mild tremors affecting the left greater than right upper extremity, most prominent with rest.  Gait and station: The patient has a normal gait. With ambulation, a mild tremor affecting the left arm is noted. Tandem gait was not tested. Romberg is negative. No drift is seen.  Reflexes: Deep tendon reflexes are symmetric.   Assessment/Plan:  1. Benign essential tremor  2. Memory disturbance  The patient inquired about treatment for the tremor. The patient is on a beta blocker, and I have indicated that other medication such as primidone may have issues with drowsiness. The patient indicates that the tremor does not affect his ability to perform  activities of daily living. I would not recommend addition of other medications at this time. The patient will be given a trial on Exelon patch, 4.6 mg. Samples were given. If he tolerates this, he is to contact our office, and a prescription will be called in. The patient will otherwise followup in 6-8 months.  Marlan Palau MD 05/05/2013 5:38 PM  [facility] [address] [place], [place] [address]  Phone [phone] Fax [phone]

## 2013-05-10 ENCOUNTER — Other Ambulatory Visit: Payer: Self-pay | Admitting: Family Medicine

## 2013-05-24 ENCOUNTER — Encounter: Payer: Self-pay | Admitting: Cardiology

## 2013-05-24 ENCOUNTER — Ambulatory Visit (INDEPENDENT_AMBULATORY_CARE_PROVIDER_SITE_OTHER): Payer: Medicare Other | Admitting: Cardiology

## 2013-05-24 ENCOUNTER — Ambulatory Visit: Payer: Medicare Other | Admitting: Cardiology

## 2013-05-24 VITALS — BP 112/70 | HR 91 | Ht 71.0 in | Wt 186.8 lb

## 2013-05-24 DIAGNOSIS — I2510 Atherosclerotic heart disease of native coronary artery without angina pectoris: Secondary | ICD-10-CM

## 2013-05-24 DIAGNOSIS — I4891 Unspecified atrial fibrillation: Secondary | ICD-10-CM

## 2013-05-24 NOTE — Patient Instructions (Addendum)
The current medical regimen is effective;  continue present plan and medications.  Follow up in 3 months with Dr Hochrein. 

## 2013-05-24 NOTE — Progress Notes (Signed)
HPI The patient returns for follow up of CAD.  Since I last saw him he has done OK.  He has had no new cardiovascular symptoms recently and in particular no neurologic complaints such as he was having most recently. He is exercising routinely The patient denies any new symptoms such as chest discomfort, neck or arm discomfort. There has been no new shortness of breath, PND or orthopnea. There have been no reported palpitations, presyncope or syncope.   Allergies  Allergen Reactions  . Asa-Apap-Salicyl-Caff     Takes warfarin  . Codeine Other (See Comments)    Unknown reaction  . Nsaids Other (See Comments)    On warfarin  . Sulfonamide Derivatives Nausea And Vomiting    Current Outpatient Prescriptions  Medication Sig Dispense Refill  . aspirin EC 81 MG tablet Take 81 mg by mouth daily.      Marland Kitchen atorvastatin (LIPITOR) 40 MG tablet TAKE 1 TABLET (40 MG TOTAL) BY MOUTH AT BEDTIME.  30 tablet  3  . digoxin (LANOXIN) 0.125 MG tablet Take 0.125 mg by mouth daily.       Marland Kitchen LIALDA 1.2 G EC tablet TAKE 1 TABLET (1.2 G TOTAL) BY MOUTH 2 (TWO) TIMES DAILY.  60 tablet  4  . metoprolol (LOPRESSOR) 100 MG tablet TAKE ONE AND A HALF TABLETS BY MOUTH TWICE DAILY  90 tablet  3  . niacin (NIASPAN) 500 MG CR tablet Take 500 mg by mouth at bedtime.        . nitroGLYCERIN (NITROSTAT) 0.3 MG SL tablet Place 0.3 mg under the tongue every 5 (five) minutes as needed. Never had to use       . omeprazole (PRILOSEC) 20 MG capsule Take 20 mg by mouth daily.       Marland Kitchen PARoxetine (PAXIL) 20 MG tablet Take 30 mg by mouth daily.       . potassium chloride SA (K-DUR,KLOR-CON) 20 MEQ tablet Take 10 mEq by mouth daily.      . silodosin (RAPAFLO) 8 MG CAPS capsule Take 8 mg by mouth daily.        . vitamin B-12 (CYANOCOBALAMIN) 1000 MCG tablet Take 1,000 mcg by mouth daily.      Carlena Hurl 20 MG TABS Take 20 mg by mouth daily.        Current Facility-Administered Medications  Medication Dose Route Frequency Provider Last  Rate Last Dose  . 0.9 %  sodium chloride infusion  500 mL Intravenous Continuous Meryl Dare, MD        Past Medical History  Diagnosis Date  . PAF (paroxysmal atrial fibrillation)   . Dyslipidemia   . Anxiety   . GIB (gastrointestinal bleeding)   . Obesity   . UC (ulcerative colitis confined to rectum)     Left-sided  . BPH (benign prostatic hyperplasia)   . Diverticulosis   . GERD (gastroesophageal reflux disease)   . Hemorrhoids   . Diabetes mellitus   . Tremor   . CAD (coronary artery disease)     Anterior MI and DES stenting 2004, Vfib arrest; left main 25% stenosis, LAD 80% stenosis, 99% stenosis, 25% circumflex stenosis, 25% ramus intermedius stenosis, 50-40% right coronary artery stenosis, 90% RV branch stenosis with right to left collaterals; EF 55% with mild naterior hypokinesis (underwent Taxus stenting at the time) . Stress perfusion study January 2012 EF 48% with old apical infarct  . Hyperlipidemia   . Hyperplastic colon polyp   . Myocardial infarction   .  Hypertension   . Stroke   . Memory deficits 05/05/2013  . Essential and other specified forms of tremor 05/05/2013    Past Surgical History  Procedure Laterality Date  . Hammer toe surgery      right  . Inguinal hernia repair      right, bilateral  . Coronary angioplasty with stent placement      Taxus stenting  . Cataract extraction w/phaco  10/27/2012    Procedure: CATARACT EXTRACTION PHACO AND INTRAOCULAR LENS PLACEMENT (IOC);  Surgeon: Gemma Payor, MD;  Location: [facility];  Service: Ophthalmology;  Laterality: Left;  CDE 22.67    ROS: As stated in the HPI and negative for all other systems.  PHYSICAL EXAM BP 112/70  Pulse 91  Ht 5\' 11"  (1.803 m)  Wt 186 lb 12.8 oz (84.732 kg)  BMI 26.06 kg/m2 GENERAL:  Well appearing NECK:  No jugular venous distention, waveform within normal limits, carotid upstroke brisk and symmetric, no bruits, no thyromegaly LUNGS:  Clear to auscultation bilaterally HEART:   PMI not displaced or sustained,S1 and S2 within normal limits, no S3, no clicks, no rubs, no murmurs, irregular ABD:  Flat, positive bowel sounds normal in frequency in pitch, no bruits, no rebound, no guarding, no midline pulsatile mass, no hepatomegaly, no splenomegaly EXT:  2 plus pulses throughout, mild left ankle edema, no cyanosis no clubbing NEURO:  Cranial nerves II through XII grossly intact, motor grossly intact throughout, resting tremor  EKG:  Atrial fibrillation, rate 91, axis within normal limits, intervals within normal limits, no acute ST-T wave changes. 05/24/2013  ASSESSMENT AND PLAN  CAD (coronary artery disease) -  The patient has no new sypmtoms.  No further cardiovascular testing is indicated.  We will continue with aggressive risk reduction and meds as listed.  His last stress test was in 2012  DYSLIPIDEMIA -  This is followed closely by Dr. Christell Constant with a recent LDL of 53 and HDL of 42.   FIBRILLATION, ATRIAL -  Mr. WOODY KRONBERG has a CHA2DS2 - VASc score of 7 with a risk of stroke of 9.6%.   He tolerates Xarelto.   He will continue with meds as listed. Because of the CVA last year and TIA in January he will remain on ASA.

## 2013-06-01 ENCOUNTER — Other Ambulatory Visit: Payer: Self-pay | Admitting: Pharmacist

## 2013-06-01 DIAGNOSIS — I480 Paroxysmal atrial fibrillation: Secondary | ICD-10-CM

## 2013-06-01 MED ORDER — RIVAROXABAN 20 MG PO TABS: 20.0000 mg | ORAL_TABLET | Freq: Every day | ORAL | Status: DC

## 2013-06-05 ENCOUNTER — Ambulatory Visit (INDEPENDENT_AMBULATORY_CARE_PROVIDER_SITE_OTHER): Payer: Medicare Other | Admitting: Pharmacist

## 2013-06-05 ENCOUNTER — Other Ambulatory Visit: Payer: Self-pay | Admitting: Family Medicine

## 2013-06-05 DIAGNOSIS — Z7901 Long term (current) use of anticoagulants: Secondary | ICD-10-CM

## 2013-06-05 NOTE — Progress Notes (Signed)
Patient's Xarelto is $150 per month now he is in coverage gap.  I called manufacturer and they states there are no programs that would assist patient. He was given coupon card for get #10 free last week.  We have discussed the need in the future to probably change back to warfarin or another anticoagulant.  I had #15 samples of Xarelto 20mg  for patient

## 2013-06-22 ENCOUNTER — Encounter: Payer: Self-pay | Admitting: Family Medicine

## 2013-06-22 ENCOUNTER — Ambulatory Visit (INDEPENDENT_AMBULATORY_CARE_PROVIDER_SITE_OTHER): Payer: Medicare Other | Admitting: Family Medicine

## 2013-06-22 VITALS — BP 141/83 | HR 89 | Temp 97.6°F | Ht 70.0 in | Wt 187.3 lb

## 2013-06-22 DIAGNOSIS — E785 Hyperlipidemia, unspecified: Secondary | ICD-10-CM | POA: Insufficient documentation

## 2013-06-22 DIAGNOSIS — R5383 Other fatigue: Secondary | ICD-10-CM

## 2013-06-22 DIAGNOSIS — I2510 Atherosclerotic heart disease of native coronary artery without angina pectoris: Secondary | ICD-10-CM

## 2013-06-22 DIAGNOSIS — E119 Type 2 diabetes mellitus without complications: Secondary | ICD-10-CM

## 2013-06-22 DIAGNOSIS — K219 Gastro-esophageal reflux disease without esophagitis: Secondary | ICD-10-CM

## 2013-06-22 DIAGNOSIS — I4891 Unspecified atrial fibrillation: Secondary | ICD-10-CM

## 2013-06-22 DIAGNOSIS — N400 Enlarged prostate without lower urinary tract symptoms: Secondary | ICD-10-CM

## 2013-06-22 DIAGNOSIS — E559 Vitamin D deficiency, unspecified: Secondary | ICD-10-CM

## 2013-06-22 DIAGNOSIS — Z79899 Other long term (current) drug therapy: Secondary | ICD-10-CM | POA: Insufficient documentation

## 2013-06-22 DIAGNOSIS — R413 Other amnesia: Secondary | ICD-10-CM

## 2013-06-22 DIAGNOSIS — E876 Hypokalemia: Secondary | ICD-10-CM

## 2013-06-22 LAB — POCT CBC
Granulocyte percent: 67.9 %G (ref 37–80)
HCT, POC: 38.4 % — AB (ref 43.5–53.7)
Hemoglobin: 12.7 g/dL — AB (ref 14.1–18.1)
MCV: 94.5 fL (ref 80–97)
POC Granulocyte: 3.1 (ref 2–6.9)
RBC: 4.1 M/uL — AB (ref 4.69–6.13)
RDW, POC: 13.6 %
WBC: 4.5 10*3/uL — AB (ref 4.6–10.2)

## 2013-06-22 NOTE — Progress Notes (Signed)
Subjective:    Patient ID: [name], male    DOB: December 08, 1929, 79 y.o.   MRN: [id number]  HPI Patient returns to clinic today for followup of chronic medical problems. These include hyperlipidemia, history of atrial fibrillation, GERD, diabetes, and BPH. He comes to the office by himself. He has a history of an essential tremor. He is also followed by the cardiologist and periodically by the neurologist. Patient recently saw a cardiologist and no changes were made by him. The patient also sees the urologist periodically and Dr. Russella Dar, M.D. Gastroenterology.   Review of Systems  Constitutional: Positive for fatigue.  HENT: Positive for postnasal drip. Negative for ear pain.   Eyes: Negative.  Negative for pain, itching and visual disturbance.  Respiratory: Positive for cough (due to drainage). Negative for chest tightness and shortness of breath.   Cardiovascular: Positive for leg swelling.  Gastrointestinal: Negative for abdominal pain, diarrhea, constipation and anal bleeding.  Endocrine: Negative.  Negative for cold intolerance, heat intolerance, polydipsia, polyphagia and polyuria.  Genitourinary: Positive for frequency (2-3 x at night). Negative for dysuria and hematuria.  Musculoskeletal: Positive for arthralgias (L shoulder). Negative for myalgias and back pain.  Skin: Negative.  Negative for color change, rash and wound.  Neurological: Positive for tremors. Negative for dizziness, weakness, light-headedness, numbness and headaches.  Psychiatric/Behavioral: Negative.  Negative for confusion, sleep disturbance and agitation. The patient is not nervous/anxious.        Objective:   Physical Exam  BP 141/83  Pulse 89  Temp(Src) 97.6 F (36.4 C) (Oral)  Ht 5\' 10"  (1.778 m)  Wt 187 lb 4.8 oz (84.959 kg)  BMI 26.87 kg/m2  The patient appeared well nourished and normally developed for his age. He is alert and oriented to time and place. Speech, behavior and judgement appear  normal. He does have a tremor exhibited in both upper extremities. Vital signs as documented.  Head exam is unremarkable. No scleral icterus or pallor noted. He is wearing hearing aids but the ear canals and eardrums were clear and normal. Mouth and throat are normal.  Neck is without jugular venous distension, thyromegally, or carotid bruits. Carotid upstrokes are brisk bilaterally. No cervical adenopathy. Lungs are clear anteriorly and posteriorly to auscultation. Normal respiratory effort. Cardiac exam reveals irregular irregular rate and rhythm at 72 per minute. First and second heart sounds normal.  No murmurs, rubs or gallops.  Abdominal exam reveals normal bowl sounds, no masses, no organomegaly and no aortic enlargement. No inguinal adenopathy. There is some slight generalized tenderness right upper quadrant and lower abdomen. Rectal exam revealed no masses. There was an external hemorrhoid. The prostate was smooth but enlarged. The external genitalia were normal and there was no inguinal hernia palpated. Extremities are nonedematous and both femoral and pedal pulses are diminished. The feet were warm and not cool to touch. Skin without pallor or jaundice.  Warm and dry, without rash. Neurologic exam reveals normal deep tendon reflexes and normal sensation. Diabetic foot exam was done         Assessment & Plan:  1. Dyslipidemia - Hepatic function panel; Standing - NMR, lipoprofile; Standing - Hepatic function panel - NMR, lipoprofile  2. Diabetes mellitus - POCT glycosylated hemoglobin (Hb A1C); Standing - BMP8+EGFR; Standing - POCT glycosylated hemoglobin (Hb A1C) - BMP8+EGFR  3. Hypokalemia - BMP8+EGFR; Standing - BMP8+EGFR  4. Esophageal reflux  5. BPH (benign prostatic hypertrophy)  6. High risk medication use  7. Vitamin D deficiency - Vitamin  D 25 hydroxy  8. Fatigue - POCT CBC; Standing - Thyroid Panel With TSH - POCT CBC  9. CAD (coronary artery  disease)  10. FIBRILLATION, ATRIAL  11. Hyperlipidemia  12. Memory deficits -These appear to be stable   Patient Instructions  Fall precautions discussed Continue current meds and therapeutic lifestyle changes Stay active physically Drink plenty of fluids especially this summer.   Nyra Capes MD

## 2013-06-22 NOTE — Patient Instructions (Addendum)
Fall precautions discussed Continue current meds and therapeutic lifestyle changes Stay active physically Drink plenty of fluids especially this summer.

## 2013-06-24 LAB — NMR, LIPOPROFILE
Cholesterol: 93 mg/dL (ref <200)
HDL Cholesterol by NMR: 44 mg/dL (ref >=40)
HDL Particle Number: 21.4 umol/L — ABNORMAL LOW (ref >=30.5)
LDLC SERPL CALC-MCNC: 39 mg/dL (ref <100)
LP-IR Score: <25 (ref <=45)
Small LDL Particle Number: 362 nmol/L (ref <=527)

## 2013-06-24 LAB — THYROID PANEL WITH TSH
Free Thyroxine Index: 1.7 (ref 1.2–4.9)
T3 Uptake Ratio: 35 % (ref 24–39)
T4, Total: 4.9 ug/dL (ref 4.5–12.0)
TSH: 3.32 u[IU]/mL (ref 0.450–4.500)

## 2013-06-24 LAB — HEPATIC FUNCTION PANEL
Albumin: 4 g/dL (ref 3.5–4.7)
Alkaline Phosphatase: 96 IU/L (ref 39–117)
Total Bilirubin: 0.9 mg/dL (ref 0.0–1.2)
Total Protein: 6.6 g/dL (ref 6.0–8.5)

## 2013-06-24 LAB — BMP8+EGFR
BUN/Creatinine Ratio: 8 — ABNORMAL LOW (ref 10–22)
BUN: 8 mg/dL (ref 8–27)
Chloride: 97 mmol/L (ref 97–108)
Creatinine, Ser: 1.02 mg/dL (ref 0.76–1.27)
GFR calc Af Amer: 80 mL/min/{1.73_m2} (ref >59)
Glucose: 98 mg/dL (ref 65–99)

## 2013-06-26 ENCOUNTER — Telehealth: Payer: Self-pay | Admitting: Pharmacist

## 2013-06-26 MED ORDER — VITAMIN D 1000 UNITS PO CAPS: 1000.0000 [IU] | ORAL_CAPSULE | Freq: Every day | ORAL | Status: DC

## 2013-06-26 NOTE — Telephone Encounter (Signed)
Per patient's wife he is not taking any vitamin D supplementation currently.  Recommended he start vitamin D3 800IU daily  Recheck vitamin D level in 3 months.

## 2013-06-26 NOTE — Telephone Encounter (Signed)
Message copied by Henrene Pastor on Mon Jun 26, 2013 10:31 AM ------      Message from: [name]      Created: Sat Jun 24, 2013  8:16 AM       Thyroid within normal limit      Vitamin D is within normal limits but at the low normal range at 34.6------ increase vitamin D 3 x 1000  Daily++++++ [name]       Kidney function and blood sugar and electrolytes are all within normal limits.      Liver function tests are within normal limit      Cholesterol numbers by advanced lipid testing are excellent except for the good cholesterol which is low      Please talk with patient's wife as patient cannot hear well       ------

## 2013-06-27 ENCOUNTER — Telehealth: Payer: Self-pay | Admitting: *Deleted

## 2013-07-17 ENCOUNTER — Other Ambulatory Visit: Payer: Self-pay | Admitting: Family Medicine

## 2013-08-09 NOTE — Telephone Encounter (Signed)
done

## 2013-08-16 ENCOUNTER — Other Ambulatory Visit: Payer: Self-pay | Admitting: Family Medicine

## 2013-09-06 ENCOUNTER — Other Ambulatory Visit: Payer: Self-pay | Admitting: Family Medicine

## 2013-09-13 ENCOUNTER — Encounter: Payer: Self-pay | Admitting: Cardiology

## 2013-09-13 ENCOUNTER — Ambulatory Visit (INDEPENDENT_AMBULATORY_CARE_PROVIDER_SITE_OTHER): Payer: Medicare Other | Admitting: Cardiology

## 2013-09-13 VITALS — BP 110/80 | HR 92 | Ht 70.0 in | Wt 186.0 lb

## 2013-09-13 DIAGNOSIS — I422 Other hypertrophic cardiomyopathy: Secondary | ICD-10-CM

## 2013-09-13 DIAGNOSIS — I679 Cerebrovascular disease, unspecified: Secondary | ICD-10-CM

## 2013-09-13 DIAGNOSIS — I2510 Atherosclerotic heart disease of native coronary artery without angina pectoris: Secondary | ICD-10-CM

## 2013-09-13 DIAGNOSIS — I4891 Unspecified atrial fibrillation: Secondary | ICD-10-CM

## 2013-09-13 NOTE — Progress Notes (Signed)
HPI The patient returns for follow up of CAD.  Since I last saw him he has done OK.  He has had no new cardiovascular symptoms recently and in particular no neurologic complaints such as he was having most recently. He is exercising routinely and exercised 5 days last week. The patient denies any new symptoms such as chest discomfort, neck or arm discomfort. There has been no new shortness of breath, PND or orthopnea. There have been no reported palpitations, presyncope or syncope.   Allergies  Allergen Reactions  . Asa-Apap-Salicyl-Caff     Takes warfarin  . Codeine Other (See Comments)    Unknown reaction  . Nsaids Other (See Comments)    On warfarin  . Sulfonamide Derivatives Nausea And Vomiting    Current Outpatient Prescriptions  Medication Sig Dispense Refill  . aspirin EC 81 MG tablet Take 81 mg by mouth daily.      Marland Kitchen atorvastatin (LIPITOR) 40 MG tablet TAKE 1 TABLET (40 MG TOTAL) BY MOUTH AT BEDTIME.  30 tablet  3  . Cholecalciferol (VITAMIN D) 1000 UNITS capsule Take 1 capsule (1,000 Units total) by mouth daily.      . [employer] B-12 1000 MCG TBCR TAKE 1 TABLET BY MOUTH EVERY DAY  60 tablet  11  . digoxin (LANOXIN) 0.125 MG tablet TAKE 1 TABLET EVERY DAY  30 tablet  2  . mesalamine (APRISO) 0.375 G 24 hr capsule Take 375 mg by mouth daily.      . metoprolol (LOPRESSOR) 100 MG tablet TAKE ONE AND A HALF TABLETS BY MOUTH TWICE DAILY  90 tablet  2  . niacin (NIASPAN) 500 MG CR tablet Take 500 mg by mouth at bedtime.        . nitroGLYCERIN (NITROSTAT) 0.3 MG SL tablet Place 0.3 mg under the tongue every 5 (five) minutes as needed. Never had to use       . omeprazole (PRILOSEC) 20 MG capsule Take 20 mg by mouth daily.       Marland Kitchen PARoxetine (PAXIL) 20 MG tablet TAKE ONE AND A HALF TABLETS BY MOUTH EVERY DAY  45 tablet  2  . Rivaroxaban (XARELTO) 20 MG TABS Take 1 tablet (20 mg total) by mouth daily.  30 tablet  0  . vitamin B-12 (CYANOCOBALAMIN) 1000 MCG tablet Take 1,000 mcg by mouth  daily.      Marland Kitchen LIALDA 1.2 G EC tablet TAKE 1 TABLET (1.2 G TOTAL) BY MOUTH 2 (TWO) TIMES DAILY.  60 tablet  4  . MYRBETRIQ 25 MG TB24 tablet       . potassium chloride SA (K-DUR,KLOR-CON) 20 MEQ tablet Take 10 mEq by mouth daily.      . silodosin (RAPAFLO) 8 MG CAPS capsule Take 8 mg by mouth daily.         Current Facility-Administered Medications  Medication Dose Route Frequency Provider Last Rate Last Dose  . 0.9 %  sodium chloride infusion  500 mL Intravenous Continuous Meryl Dare, MD        Past Medical History  Diagnosis Date  . PAF (paroxysmal atrial fibrillation)   . Dyslipidemia   . Anxiety   . GIB (gastrointestinal bleeding)   . Obesity   . UC (ulcerative colitis confined to rectum)     Left-sided  . BPH (benign prostatic hyperplasia)   . Diverticulosis   . GERD (gastroesophageal reflux disease)   . Hemorrhoids   . Diabetes mellitus   . Tremor   . CAD (  coronary artery disease)     Anterior MI and DES stenting 2004, Vfib arrest; left main 25% stenosis, LAD 80% stenosis, 99% stenosis, 25% circumflex stenosis, 25% ramus intermedius stenosis, 50-40% right coronary artery stenosis, 90% RV branch stenosis with right to left collaterals; EF 55% with mild naterior hypokinesis (underwent Taxus stenting at the time) . Stress perfusion study January 2012 EF 48% with old apical infarct  . Hyperlipidemia   . Hyperplastic colon polyp   . Myocardial infarction   . Hypertension   . Stroke   . Memory deficits 05/05/2013  . Essential and other specified forms of tremor 05/05/2013    Past Surgical History  Procedure Laterality Date  . Hammer toe surgery      right  . Inguinal hernia repair      right, bilateral  . Coronary angioplasty with stent placement      Taxus stenting  . Cataract extraction w/phaco  10/27/2012    Procedure: CATARACT EXTRACTION PHACO AND INTRAOCULAR LENS PLACEMENT (IOC);  Surgeon: Gemma Payor, MD;  Location: [facility];  Service: Ophthalmology;  Laterality:  Left;  CDE 22.67    ROS: As stated in the HPI and negative for all other systems.  PHYSICAL EXAM BP 110/80  Pulse 92  Ht 5\' 10"  (1.778 m)  Wt 186 lb (84.369 kg)  BMI 26.69 kg/m2 GENERAL:  Well appearing NECK:  No jugular venous distention, waveform within normal limits, carotid upstroke brisk and symmetric, no bruits, no thyromegaly LUNGS:  Clear to auscultation bilaterally HEART:  PMI not displaced or sustained,S1 and S2 within normal limits, no S3, no clicks, no rubs, no murmurs, irregular ABD:  Flat, positive bowel sounds normal in frequency in pitch, no bruits, no rebound, no guarding, no midline pulsatile mass, no hepatomegaly, no splenomegaly EXT:  2 plus pulses throughout, mild left ankle edema, no cyanosis no clubbing NEURO:  Cranial nerves II through XII grossly intact, motor grossly intact throughout, resting tremor   ASSESSMENT AND PLAN  CAD (coronary artery disease) -  The patient has no new sypmtoms.  No further cardiovascular testing is indicated.  We will continue with aggressive risk reduction and meds as listed.  His last stress test was in 2012.  I would like to repeat an echocardiogram as his EF has not been evaluated recently.    DYSLIPIDEMIA -  This is followed closely by Dr. Christell Constant.  I will defer to his management.    FIBRILLATION, ATRIAL -  Mr. MEREK NIU has a CHA2DS2 - VASc score of 7 with a risk of stroke of 9.6%.   He tolerates Xarelto.   He will continue with meds as listed. Because of the CVA last year and TIA in January he will remain on ASA.

## 2013-09-13 NOTE — Patient Instructions (Signed)
The current medical regimen is effective;  continue present plan and medications.  Your physician has requested that you have an echocardiogram. Echocardiography is a painless test that uses sound waves to create images of your heart. It provides your doctor with information about the size and shape of your heart and how well your heart's chambers and valves are working. This procedure takes approximately one hour. There are no restrictions for this procedure.  Follow up in 6 months with Dr Hochrein.  You will receive a letter in the mail 2 months before you are due.  Please call us when you receive this letter to schedule your follow up appointment.  

## 2013-09-14 ENCOUNTER — Other Ambulatory Visit: Payer: Self-pay | Admitting: Family Medicine

## 2013-09-15 ENCOUNTER — Other Ambulatory Visit: Payer: Self-pay | Admitting: *Deleted

## 2013-09-21 ENCOUNTER — Other Ambulatory Visit (INDEPENDENT_AMBULATORY_CARE_PROVIDER_SITE_OTHER): Payer: Medicare Other

## 2013-09-21 ENCOUNTER — Other Ambulatory Visit: Payer: Self-pay

## 2013-09-21 DIAGNOSIS — I422 Other hypertrophic cardiomyopathy: Secondary | ICD-10-CM

## 2013-09-21 DIAGNOSIS — I4891 Unspecified atrial fibrillation: Secondary | ICD-10-CM

## 2013-09-25 ENCOUNTER — Encounter: Payer: Self-pay | Admitting: Pharmacist

## 2013-09-25 ENCOUNTER — Ambulatory Visit (INDEPENDENT_AMBULATORY_CARE_PROVIDER_SITE_OTHER): Payer: Medicare Other | Admitting: Pharmacist

## 2013-09-25 VITALS — BP 114/78 | HR 85

## 2013-09-25 DIAGNOSIS — E119 Type 2 diabetes mellitus without complications: Secondary | ICD-10-CM

## 2013-09-25 NOTE — Patient Instructions (Signed)
Blood sugar / Glucose Goals  Fasting (nothing to eat within the last 4 hours ) - 80 to 130  within 2 hours of the start of a meal - less than 180.

## 2013-09-25 NOTE — Progress Notes (Signed)
Diabetes Follow-Up Visit Chief Complaint:   Chief Complaint  Patient presents with  . Diabetes     Filed Vitals:   09/25/13 1205  BP: 114/78  Pulse: 85     HPI:  Patient with diet controlled type 2DM.  He states that he has forgotten how to use his glucometer.  He has a One Touch Ultra Glucometer and checks BG from 2 times per week to daily. Patient's lancing device was also broken.   Current Diabetes Medications:  None  Exam Edema:  negative  Polyuria: positive - but uses Myrbetriq with good results  Polydipsia:  negatvie Polyphagia:  negative   General Appearance:  alert, oriented, no acute distress and well nourished Mood/Affect:  normal   Low fat/carbohydrate diet?  Yes Nicotine Abuse?  No Medication Compliance?  Yes Exercise?  No Alcohol Abuse?  No    Lab Results  Component Value Date   HGBA1C 5.9 06/22/2013    No results found for this basenameConcepcion Elk    Lab Results  Component Value Date   CHOL 93 06/22/2013   HDL 42 02/16/2013   LDLCALC 53 02/16/2013   TRIG 65 02/16/2013   CHOLHDL 2.6 02/16/2013      Assessment: 1.  Diabetes.  controlled 2.  Blood Pressure.  controlled 3.  Lipids.  Controlled - taking atorvastatin and niacin   Recommendations: 1.  Medication recommendations at this time are as follows:    Discontinue niacin, continue atorvastatin 40mg  daily 2.  Reviewed HBG goals:  Fasting 80-130 and 1-2 hour post prandial <180.  Patient is instructed to check BG 1 times per day.     Patient given new lancing device.  Taught him how to use new lancing device and reviewed how to use glucometer.   3.  BP goal < 140/80. 4.  LDL goal of < 100, HDL > 40 and TG < 150. 5.  Return to clinic in 2 wks - to follow up with Dr Christell Constant   Time spent counseling patient:  20 minutes  Referring provider:  Lady Deutscher, PharmD, CPP

## 2013-09-26 ENCOUNTER — Encounter (HOSPITAL_COMMUNITY): Payer: Self-pay | Admitting: Emergency Medicine

## 2013-09-26 ENCOUNTER — Emergency Department (HOSPITAL_COMMUNITY)
Admission: EM | Admit: 2013-09-26 | Discharge: 2013-09-26 | Disposition: A | Discharge status: Home / Self Care | Payer: Medicare Other | Attending: Emergency Medicine | Admitting: Emergency Medicine

## 2013-09-26 ENCOUNTER — Emergency Department (HOSPITAL_COMMUNITY): Payer: Medicare Other

## 2013-09-26 ENCOUNTER — Telehealth: Payer: Self-pay | Admitting: Neurology

## 2013-09-26 DIAGNOSIS — Z9861 Coronary angioplasty status: Secondary | ICD-10-CM | POA: Insufficient documentation

## 2013-09-26 DIAGNOSIS — N400 Enlarged prostate without lower urinary tract symptoms: Secondary | ICD-10-CM | POA: Insufficient documentation

## 2013-09-26 DIAGNOSIS — E669 Obesity, unspecified: Secondary | ICD-10-CM | POA: Insufficient documentation

## 2013-09-26 DIAGNOSIS — R3915 Urgency of urination: Secondary | ICD-10-CM | POA: Insufficient documentation

## 2013-09-26 DIAGNOSIS — Z8673 Personal history of transient ischemic attack (TIA), and cerebral infarction without residual deficits: Secondary | ICD-10-CM | POA: Insufficient documentation

## 2013-09-26 DIAGNOSIS — I4891 Unspecified atrial fibrillation: Secondary | ICD-10-CM | POA: Insufficient documentation

## 2013-09-26 DIAGNOSIS — Z79899 Other long term (current) drug therapy: Secondary | ICD-10-CM | POA: Insufficient documentation

## 2013-09-26 DIAGNOSIS — Z7982 Long term (current) use of aspirin: Secondary | ICD-10-CM | POA: Insufficient documentation

## 2013-09-26 DIAGNOSIS — E119 Type 2 diabetes mellitus without complications: Secondary | ICD-10-CM | POA: Insufficient documentation

## 2013-09-26 DIAGNOSIS — E785 Hyperlipidemia, unspecified: Secondary | ICD-10-CM | POA: Insufficient documentation

## 2013-09-26 DIAGNOSIS — I10 Essential (primary) hypertension: Secondary | ICD-10-CM | POA: Insufficient documentation

## 2013-09-26 DIAGNOSIS — R251 Tremor, unspecified: Secondary | ICD-10-CM

## 2013-09-26 DIAGNOSIS — Z860100 Personal history of colon polyps, unspecified: Secondary | ICD-10-CM | POA: Insufficient documentation

## 2013-09-26 DIAGNOSIS — I252 Old myocardial infarction: Secondary | ICD-10-CM | POA: Insufficient documentation

## 2013-09-26 DIAGNOSIS — K219 Gastro-esophageal reflux disease without esophagitis: Secondary | ICD-10-CM | POA: Insufficient documentation

## 2013-09-26 DIAGNOSIS — Z87891 Personal history of nicotine dependence: Secondary | ICD-10-CM | POA: Insufficient documentation

## 2013-09-26 DIAGNOSIS — F411 Generalized anxiety disorder: Secondary | ICD-10-CM | POA: Insufficient documentation

## 2013-09-26 DIAGNOSIS — Z8669 Personal history of other diseases of the nervous system and sense organs: Secondary | ICD-10-CM | POA: Insufficient documentation

## 2013-09-26 DIAGNOSIS — I2510 Atherosclerotic heart disease of native coronary artery without angina pectoris: Secondary | ICD-10-CM | POA: Insufficient documentation

## 2013-09-26 DIAGNOSIS — R259 Unspecified abnormal involuntary movements: Secondary | ICD-10-CM | POA: Insufficient documentation

## 2013-09-26 LAB — COMPREHENSIVE METABOLIC PANEL
ALT: 16 U/L (ref 0–53)
Albumin: 4 g/dL (ref 3.5–5.2)
BUN: 9 mg/dL (ref 6–23)
Calcium: 9.5 mg/dL (ref 8.4–10.5)
Creatinine, Ser: 0.86 mg/dL (ref 0.50–1.35)
Total Protein: 7.7 g/dL (ref 6.0–8.3)

## 2013-09-26 LAB — TROPONIN I: Troponin I: <0.3 ng/mL (ref <0.30)

## 2013-09-26 LAB — CBC WITH DIFFERENTIAL/PLATELET
Basophils Relative: 1 % (ref 0–1)
Eosinophils Absolute: 0.1 10*3/uL (ref 0.0–0.7)
HCT: 40 % (ref 39.0–52.0)
Hemoglobin: 13.4 g/dL (ref 13.0–17.0)
MCH: 32.3 pg (ref 26.0–34.0)
MCHC: 33.5 g/dL (ref 30.0–36.0)
Monocytes Absolute: 0.6 10*3/uL (ref 0.1–1.0)
Monocytes Relative: 9 % (ref 3–12)
Neutrophils Relative %: 74 % (ref 43–77)
RDW: 12.9 % (ref 11.5–15.5)

## 2013-09-26 LAB — URINALYSIS, ROUTINE W REFLEX MICROSCOPIC
Bilirubin Urine: NEGATIVE
Glucose, UA: NEGATIVE mg/dL
Nitrite: NEGATIVE
Specific Gravity, Urine: 1.02 (ref 1.005–1.030)
pH: 7.5 (ref 5.0–8.0)

## 2013-09-26 LAB — PROTIME-INR
INR: 1.94 — ABNORMAL HIGH (ref 0.00–1.49)
Prothrombin Time: 21.6 seconds — ABNORMAL HIGH (ref 11.6–15.2)

## 2013-09-26 MED ORDER — ALPRAZOLAM 0.5 MG PO TABS
0.2500 mg | ORAL_TABLET | Freq: Once | ORAL | Status: AC
  Administered 2013-09-26: 0.25 mg via ORAL
  Filled 2013-09-26: qty 1

## 2013-09-26 NOTE — ED Notes (Signed)
Tremors started last night.   Worse this morning.  Has history of tremors .  Sees neurologist- Dr. Stephanie Acre

## 2013-09-26 NOTE — ED Notes (Signed)
Spoke with pt's niece per pt request. Pt niece driving pt home.

## 2013-09-26 NOTE — ED Notes (Signed)
CT transporter reports pt began dry heaving after being transferred from stretcher to CT table.  Pt denies nausea at this time.  nad noted.

## 2013-09-26 NOTE — ED Provider Notes (Signed)
CSN: [id number]     Arrival date & time 09/26/13  1118 History   This chart was scribed for Gregory Octave, MD, by [name], ED Scribe. This patient was seen in room [facility]14/[facility]14 and the patient's care was started at 12:18 PM.  First MD Initiated Contact with Patient 09/26/13 1217     Chief Complaint  Patient presents with  . Tremors    HPI HPI Comments: [name] is a 79 y.o. male, with a h/o stroke, brought in by EMS,  who presents to the Emergency Department complaining of persistent tremors which were present upon awakening and have progressively worsened. The pt has a h/o tremors, though he reports he does not experience tremors daily and he has not previously had tremors of similar duration. He reports he has been taking medication as prescribed.  He denies chest pain, abdominal pain, or back pain. He denies eye surgery. He is a former smoker.   [name] is his neurologist. The pt has not been informed as to the cause of his tremors.   Past Medical History  Diagnosis Date  . PAF (paroxysmal atrial fibrillation)   . Dyslipidemia   . Anxiety   . GIB (gastrointestinal bleeding)   . Obesity   . UC (ulcerative colitis confined to rectum)     Left-sided  . BPH (benign prostatic hyperplasia)   . Diverticulosis   . GERD (gastroesophageal reflux disease)   . Hemorrhoids   . Diabetes mellitus   . Tremor   . CAD (coronary artery disease)     Anterior MI and DES stenting 2004, Vfib arrest; left main 25% stenosis, LAD 80% stenosis, 99% stenosis, 25% circumflex stenosis, 25% ramus intermedius stenosis, 50-40% right coronary artery stenosis, 90% RV branch stenosis with right to left collaterals; EF 55% with mild naterior hypokinesis (underwent Taxus stenting at the time) . Stress perfusion study January 2012 EF 48% with old apical infarct  . Hyperlipidemia   . Hyperplastic colon polyp   . Myocardial infarction   . Hypertension   . Stroke   . Memory deficits 05/05/2013  .  Essential and other specified forms of tremor 05/05/2013   Past Surgical History  Procedure Laterality Date  . Hammer toe surgery      right  . Inguinal hernia repair      right, bilateral  . Coronary angioplasty with stent placement      Taxus stenting  . Cataract extraction w/phaco  10/27/2012    Procedure: CATARACT EXTRACTION PHACO AND INTRAOCULAR LENS PLACEMENT (IOC);  Surgeon: Gemma Payor, MD;  Location: [facility];  Service: Ophthalmology;  Laterality: Left;  CDE 22.67   Family History  Problem Relation Age of Onset  . Colon cancer Neg Hx   . Alzheimer's disease Brother   . Diabetes Brother     x 4  . Diabetes Mother     ?  . Diabetes Father   . Heart disease Father   . Heart disease Paternal Grandfather   . Heart disease Brother     x 3  . COPD Sister   . Diabetes Sister    History  Substance Use Topics  . Smoking status: Former Smoker    Quit date: 11/23/1958  . Smokeless tobacco: Former Neurosurgeon     Comment: Quit years ago  . Alcohol Use: No    Review of Systems  Cardiovascular: Negative for chest pain.  Gastrointestinal: Negative for abdominal pain.  Genitourinary: Positive for urgency.  Musculoskeletal: Negative for back  pain.  Neurological: Positive for tremors.  All other systems reviewed and are negative.    Allergies  Asa-apap-salicyl-caff; Codeine; Nsaids; and Sulfonamide derivatives  Home Medications   Current Outpatient Rx  Name  Route  Sig  Dispense  Refill  . aspirin EC 81 MG tablet   Oral   Take 81 mg by mouth daily.         Marland Kitchen atorvastatin (LIPITOR) 40 MG tablet      TAKE 1 TABLET (40 MG TOTAL) BY MOUTH AT BEDTIME.   30 tablet   3   . Cholecalciferol (VITAMIN D) 1000 UNITS capsule   Oral   Take 1 capsule (1,000 Units total) by mouth daily.         . CVS B-12 1000 MCG TBCR      TAKE 1 TABLET BY MOUTH EVERY DAY   60 tablet   11   . digoxin (LANOXIN) 0.125 MG tablet      TAKE 1 TABLET EVERY DAY   30 tablet   2   . LIALDA 1.2  G EC tablet      TAKE 1 TABLET (1.2 G TOTAL) BY MOUTH 2 (TWO) TIMES DAILY.   60 tablet   4   . mesalamine (APRISO) 0.375 G 24 hr capsule   Oral   Take 375 mg by mouth daily.         . metoprolol (LOPRESSOR) 100 MG tablet      TAKE ONE AND A HALF TABLETS BY MOUTH TWICE DAILY   90 tablet   2   . MYRBETRIQ 25 MG TB24 tablet               . nitroGLYCERIN (NITROSTAT) 0.3 MG SL tablet   Sublingual   Place 0.3 mg under the tongue every 5 (five) minutes as needed. Never had to use          . omeprazole (PRILOSEC) 20 MG capsule   Oral   Take 20 mg by mouth daily.          Marland Kitchen PARoxetine (PAXIL) 20 MG tablet      TAKE ONE AND A HALF TABLETS BY MOUTH EVERY DAY   45 tablet   2   . potassium chloride SA (K-DUR,KLOR-CON) 20 MEQ tablet   Oral   Take 10 mEq by mouth daily.         . Rivaroxaban (XARELTO) 20 MG TABS   Oral   Take 1 tablet (20 mg total) by mouth daily.   30 tablet   0     rx for free trial offer/Xarelto card.   . silodosin (RAPAFLO) 8 MG CAPS capsule   Oral   Take 8 mg by mouth daily.           . vitamin B-12 (CYANOCOBALAMIN) 1000 MCG tablet   Oral   Take 1,000 mcg by mouth daily.          Triage Vitals: BP 151/81  Temp(Src) 98.5 F (36.9 C) (Oral)  SpO2 96%  Physical Exam  Nursing note and vitals reviewed. Constitutional: He is oriented to person, place, and time. He appears well-developed and well-nourished. No distress.  Hard of hearing  HENT:  Head: Normocephalic and atraumatic.  Eyes: EOM are normal.  Left pupil appears slightly larger than right, but EOM.  Neck: Neck supple. No tracheal deviation present.  Cardiovascular: Normal rate.   Pulmonary/Chest: Effort normal. No respiratory distress.  Musculoskeletal: Normal range of motion.  Neurological: He is alert  and oriented to person, place, and time. No cranial nerve deficit. He exhibits normal muscle tone. Coordination normal.  Resting tremors bilaterally upper extremities and  jaw. Left is worse than right.  CN 2-12 intact, no ataxia on finger to nose, no nystagmus, 5/5 strength throughout, no pronator drift, Romberg negative, normal gait.   Skin: Skin is warm and dry.  Psychiatric: He has a normal mood and affect. His behavior is normal.    ED Course  Procedures (including critical care time)  DIAGNOSTIC STUDIES: Oxygen Saturation is 96% on room air, normal by my interpretation.    COORDINATION OF CARE:  12:27 PM- Discussed treatment plan with patient, and the patient agreed to the plan.   1:00 PM- Rechecked pt.   Labs Review Labs Reviewed  CBC WITH DIFFERENTIAL - Abnormal; Notable for the following:    RBC 4.15 (*)    Platelets 143 (*)    All other components within normal limits  COMPREHENSIVE METABOLIC PANEL - Abnormal; Notable for the following:    Sodium 133 (*)    Chloride 93 (*)    Glucose, Bld 129 (*)    Total Bilirubin 1.3 (*)    GFR calc non Af Amer 80 (*)    All other components within normal limits  PROTIME-INR - Abnormal; Notable for the following:    Prothrombin Time 21.6 (*)    INR 1.94 (*)    All other components within normal limits  URINALYSIS, ROUTINE W REFLEX MICROSCOPIC - Abnormal; Notable for the following:    Ketones, ur TRACE (*)    Urobilinogen, UA 2.0 (*)    All other components within normal limits  TROPONIN I    Imaging Review Ct Head Wo Contrast  09/26/2013   CLINICAL DATA:  History of stroke. Tremors.  EXAM: CT HEAD WITHOUT CONTRAST  TECHNIQUE: Contiguous axial images were obtained from the base of the skull through the vertex without intravenous contrast.  COMPARISON:  12/05/2012.  FINDINGS: No mass lesion, mass effect, midline shift, hydrocephalus, hemorrhage. No acute territorial cortical ischemia/infarct. Atrophy and chronic ischemic white matter disease is present. The calvarium is intact. Intracranial atherosclerosis. Benign basal ganglia calcifications are present.  IMPRESSION: Atrophy and chronic ischemic  white matter disease without acute intracranial abnormality.   Electronically Signed   By: Andreas Newport M.D.   On: 09/26/2013 13:47    EKG Interpretation   None       MDM   1. Tremors of nervous system    Hx intermittent tremors, worsening today and persisent all morning.  No weakness, numbness, tingling, headache.  Reviewed Dr. Clarisa Kindred note from June.  Patient states compliance with beta blocker. No apparent weaknss, numbness, tingling.  D/w [name].  He recommends small dose of xanax to see if improvement and followup in office this week.    EKG atrial fibrillation, Rate 90, normal axis, septal q waves unchanged, no ST changes.   I personally performed the services described in this documentation, which was scribed in my presence. The recorded information has been reviewed and is accurate.      Gregory Octave, MD 09/26/13 [phone]

## 2013-09-26 NOTE — Telephone Encounter (Signed)
The patient went to the [facility] ER with increased tremor. The patient had a tremor of the upper extremities and jaw. It is not clear exactly why the patient went to the emergency room. We will try to get the patient in for a revisit within the next couple weeks.

## 2013-10-03 ENCOUNTER — Ambulatory Visit (INDEPENDENT_AMBULATORY_CARE_PROVIDER_SITE_OTHER): Payer: Medicare Other | Admitting: Neurology

## 2013-10-03 ENCOUNTER — Encounter: Payer: Self-pay | Admitting: Neurology

## 2013-10-03 VITALS — BP 124/70 | HR 66 | Wt 182.0 lb

## 2013-10-03 DIAGNOSIS — R4701 Aphasia: Secondary | ICD-10-CM

## 2013-10-03 DIAGNOSIS — R413 Other amnesia: Secondary | ICD-10-CM

## 2013-10-03 DIAGNOSIS — G459 Transient cerebral ischemic attack, unspecified: Secondary | ICD-10-CM

## 2013-10-03 DIAGNOSIS — G250 Essential tremor: Secondary | ICD-10-CM

## 2013-10-03 DIAGNOSIS — I679 Cerebrovascular disease, unspecified: Secondary | ICD-10-CM

## 2013-10-03 NOTE — Progress Notes (Signed)
Reason for visit: Transient aphasia  [name] is an 79 y.o. male  History of present illness:  [name] is an 79 year old left-handed white male with a history of a memory disorder, and an essential tremor. The patient recently was in the emergency room on 09/26/2013 with a transient episode of aphasia. The patient was slurring his words, and the speech was nonsensical. This lasted about 2 hours with resolution. A repeat CT scan of the brain did not show acute changes. The patient has had a recent 2-D echocardiogram that showed an ejection fraction of 50-55%, with a small PFO. The patient is on Xarelto and low-dose aspirin. The patient may have a slight headache after the episode of transient aphasia. The patient has had a total of 3 such episodes. A carotid Doppler study done in March of 2014 shows left internal carotid artery stenosis of 50-69%.  Past Medical History  Diagnosis Date  . PAF (paroxysmal atrial fibrillation)   . Dyslipidemia   . Anxiety   . GIB (gastrointestinal bleeding)   . Obesity   . UC (ulcerative colitis confined to rectum)     Left-sided  . BPH (benign prostatic hyperplasia)   . Diverticulosis   . GERD (gastroesophageal reflux disease)   . Hemorrhoids   . Diabetes mellitus   . Tremor   . CAD (coronary artery disease)     Anterior MI and DES stenting 2004, Vfib arrest; left main 25% stenosis, LAD 80% stenosis, 99% stenosis, 25% circumflex stenosis, 25% ramus intermedius stenosis, 50-40% right coronary artery stenosis, 90% RV branch stenosis with right to left collaterals; EF 55% with mild naterior hypokinesis (underwent Taxus stenting at the time) . Stress perfusion study January 2012 EF 48% with old apical infarct  . Hyperlipidemia   . Hyperplastic colon polyp   . Myocardial infarction   . Hypertension   . Stroke   . Memory deficits 05/05/2013  . Essential and other specified forms of tremor 05/05/2013  . Hearing deficit     Wears hearing aids     Past Surgical History  Procedure Laterality Date  . Hammer toe surgery      right  . Inguinal hernia repair      right, bilateral  . Coronary angioplasty with stent placement      Taxus stenting  . Cataract extraction w/phaco  10/27/2012    Procedure: CATARACT EXTRACTION PHACO AND INTRAOCULAR LENS PLACEMENT (IOC);  Surgeon: Gemma Payor, MD;  Location: [facility];  Service: Ophthalmology;  Laterality: Left;  CDE 22.67    Family History  Problem Relation Age of Onset  . Colon cancer Neg Hx   . Alzheimer's disease Brother   . Diabetes Brother     x 4  . Diabetes Mother     ?  . Diabetes Father   . Heart disease Father   . Heart disease Paternal Grandfather   . Heart disease Brother     x 3  . COPD Sister   . Diabetes Sister     Social history:  reports that he quit smoking about 54 years ago. He has quit using smokeless tobacco. His smokeless tobacco use included Chew. He reports that he does not drink alcohol or use illicit drugs.    Allergies  Allergen Reactions  . Asa-Apap-Salicyl-Caff     Takes warfarin  . Codeine Other (See Comments)    Unknown reaction  . Nsaids Other (See Comments)    On warfarin  . Sulfonamide Derivatives Nausea And  Vomiting    Medications:  Current Outpatient Prescriptions on File Prior to Visit  Medication Sig Dispense Refill  . aspirin EC 81 MG tablet Take 81 mg by mouth daily.      Marland Kitchen atorvastatin (LIPITOR) 40 MG tablet TAKE 1 TABLET (40 MG TOTAL) BY MOUTH AT BEDTIME.  30 tablet  3  . Cholecalciferol (VITAMIN D) 1000 UNITS capsule Take 1 capsule (1,000 Units total) by mouth daily.      . CVS B-12 1000 MCG TBCR TAKE 1 TABLET BY MOUTH EVERY DAY  60 tablet  11  . digoxin (LANOXIN) 0.125 MG tablet TAKE 1 TABLET EVERY DAY  30 tablet  2  . LIALDA 1.2 G EC tablet TAKE 1 TABLET (1.2 G TOTAL) BY MOUTH 2 (TWO) TIMES DAILY.  60 tablet  4  . mesalamine (APRISO) 0.375 G 24 hr capsule Take 375 mg by mouth daily.      . metoprolol (LOPRESSOR) 100 MG  tablet TAKE ONE AND A HALF TABLETS BY MOUTH TWICE DAILY  90 tablet  2  . MYRBETRIQ 25 MG TB24 tablet       . nitroGLYCERIN (NITROSTAT) 0.3 MG SL tablet Place 0.3 mg under the tongue every 5 (five) minutes as needed. Never had to use       . omeprazole (PRILOSEC) 20 MG capsule Take 20 mg by mouth daily.       Marland Kitchen PARoxetine (PAXIL) 20 MG tablet TAKE ONE AND A HALF TABLETS BY MOUTH EVERY DAY  45 tablet  2  . potassium chloride SA (K-DUR,KLOR-CON) 20 MEQ tablet Take 10 mEq by mouth daily.      . Rivaroxaban (XARELTO) 20 MG TABS Take 1 tablet (20 mg total) by mouth daily.  30 tablet  0  . silodosin (RAPAFLO) 8 MG CAPS capsule Take 8 mg by mouth daily.        . vitamin B-12 (CYANOCOBALAMIN) 1000 MCG tablet Take 1,000 mcg by mouth daily.       No current facility-administered medications on file prior to visit.    ROS:  Out of a complete 14 system review of symptoms, the patient complains only of the following symptoms, and all other reviewed systems are negative.  Hearing loss, ringing in the ears Urination problems Easy bruising Allergies Memory loss, confusion, slurred speech, tremor Anxiety  Blood pressure 124/70, pulse 66, weight 182 lb (82.555 kg).  Physical Exam  General: The patient is alert and cooperative at the time of the examination.  Skin: No significant peripheral edema is noted.   Neurologic Exam  Mental status: The Mini-Mental status examination done today shows a total score 21/30.  Cranial nerves: Facial symmetry is present. Speech is normal, no aphasia or dysarthria is noted. Extraocular movements are full. Visual fields are full.  Motor: The patient has good strength in all 4 extremities.  Sensory examination: Soft touch sensation on the face, arms, and legs is symmetric.  Coordination: The patient has good finger-nose-finger and heel-to-shin bilaterally.  Gait and station: The patient has a normal gait. Tandem gait is slightly unsteady. Romberg is negative.  No drift is seen.  Reflexes: Deep tendon reflexes are symmetric.   Assessment/Plan:  One. Benign essential tremor  2. Transient episodes of aphasia, rule out TIA  3. Memory disorder  The patient will be sent back for a carotid Doppler study. The patient will have an EEG evaluation. The patient is on maximal medical therapy with Xarelto and aspirin. The patient will followup through this office  in 6 months.  Marlan Palau MD 10/03/2013 6:20 PM  [facility] [address] [place], [place] [address]  Phone [phone] Fax [phone]

## 2013-10-03 NOTE — Patient Instructions (Signed)
Tremor  Tremor is a rhythmic, involuntary muscular contraction characterized by oscillations (to-and-fro movements) of a part of the body. The most common of all involuntary movements, tremor can affect various body parts such as the hands, head, facial structures, vocal cords, trunk, and legs; most tremors, however, occur in the hands. Tremor often accompanies neurological disorders associated with aging. Although the disorder is not life-threatening, it can be responsible for functional disability and social embarrassment.  TREATMENT   There are many types of tremor and several ways in which tremor is classified. The most common classification is by behavioral context or position. There are five categories of tremor within this classification: resting, postural, kinetic, task-specific, and psychogenic. Resting or static tremor occurs when the muscle is at rest, for example when the hands are lying on the lap. This type of tremor is often seen in patients with Parkinson's disease. Postural tremor occurs when a patient attempts to maintain posture, such as holding the hands outstretched. Postural tremors include physiological tremor, essential tremor, tremor with basal ganglia disease (also seen in patients with Parkinson's disease), cerebellar postural tremor, tremor with peripheral neuropathy, post-traumatic tremor, and alcoholic tremor. Kinetic or intention (action) tremor occurs during purposeful movement, for example during finger-to-nose testing. Task-specific tremor appears when performing goal-oriented tasks such as handwriting, speaking, or standing. This group consists of primary writing tremor, vocal tremor, and orthostatic tremor. Psychogenic tremor occurs in both older and younger patients. The key feature of this tremor is that it dramatically lessens or disappears when the patient is distracted.  PROGNOSIS  There are some treatment options available for tremor; the appropriate treatment depends on  accurate diagnosis of the cause. Some tremors respond to treatment of the underlying condition, for example in some cases of psychogenic tremor treating the patient's underlying mental problem may cause the tremor to disappear. Also, patients with tremor due to Parkinson's disease may be treated with Levodopa drug therapy. Symptomatic drug therapy is available for several other tremors as well. For those cases of tremor in which there is no effective drug treatment, physical measures such as teaching the patient to brace the affected limb during the tremor are sometimes useful. Surgical intervention such as thalamotomy or deep brain stimulation may be useful in certain cases.  Document Released: 10/30/2002 Document Revised: 02/01/2012 Document Reviewed: 11/09/2005  [employer]® Patient Information ©2014 [employer].

## 2013-10-09 ENCOUNTER — Ambulatory Visit: Payer: Medicare Other | Admitting: Family Medicine

## 2013-10-10 ENCOUNTER — Other Ambulatory Visit: Payer: Self-pay | Admitting: Family Medicine

## 2013-10-10 ENCOUNTER — Encounter: Payer: Self-pay | Admitting: *Deleted

## 2013-10-10 ENCOUNTER — Ambulatory Visit (INDEPENDENT_AMBULATORY_CARE_PROVIDER_SITE_OTHER): Payer: Medicare Other

## 2013-10-10 ENCOUNTER — Encounter: Payer: Self-pay | Admitting: Family Medicine

## 2013-10-10 ENCOUNTER — Ambulatory Visit (INDEPENDENT_AMBULATORY_CARE_PROVIDER_SITE_OTHER): Payer: Medicare Other | Admitting: Family Medicine

## 2013-10-10 VITALS — BP 124/75 | HR 96 | Temp 97.6°F | Ht 70.0 in | Wt 183.0 lb

## 2013-10-10 DIAGNOSIS — E559 Vitamin D deficiency, unspecified: Secondary | ICD-10-CM

## 2013-10-10 DIAGNOSIS — I4891 Unspecified atrial fibrillation: Secondary | ICD-10-CM

## 2013-10-10 DIAGNOSIS — G252 Other specified forms of tremor: Secondary | ICD-10-CM

## 2013-10-10 DIAGNOSIS — N400 Enlarged prostate without lower urinary tract symptoms: Secondary | ICD-10-CM

## 2013-10-10 DIAGNOSIS — G250 Essential tremor: Secondary | ICD-10-CM

## 2013-10-10 DIAGNOSIS — I2510 Atherosclerotic heart disease of native coronary artery without angina pectoris: Secondary | ICD-10-CM

## 2013-10-10 DIAGNOSIS — E785 Hyperlipidemia, unspecified: Secondary | ICD-10-CM

## 2013-10-10 DIAGNOSIS — E119 Type 2 diabetes mellitus without complications: Secondary | ICD-10-CM

## 2013-10-10 DIAGNOSIS — K219 Gastro-esophageal reflux disease without esophagitis: Secondary | ICD-10-CM

## 2013-10-10 LAB — POCT CBC
Granulocyte percent: 70.7 % (ref 37–80)
HCT, POC: 39.7 % — AB (ref 43.5–53.7)
Hemoglobin: 13.5 g/dL — AB (ref 14.1–18.1)
Lymph, poc: 1.2 (ref 0.6–3.4)
MCH, POC: 31.7 pg — AB (ref 27–31.2)
MCHC: 33.9 g/dL (ref 31.8–35.4)
MCV: 93.7 fL (ref 80–97)
MPV: 7.2 fL (ref 0–99.8)
POC Granulocyte: 3.7 (ref 2–6.9)
POC LYMPH PERCENT: 23.5 % (ref 10–50)
Platelet Count, POC: 152 10*3/uL (ref 142–424)
RBC: 4.2 M/uL — AB (ref 4.69–6.13)
RDW, POC: 13.5 %
WBC: 5.2 10*3/uL (ref 4.6–10.2)

## 2013-10-10 NOTE — Patient Instructions (Addendum)
Continue current medications. Continue good therapeutic lifestyle changes which include good diet and exercise. Fall precautions discussed with patient. Schedule your flu vaccine if you haven't had it yet If you are over 77 years old - you may need Prevnar 13 or the adult Pneumonia vaccine. Please discuss with your niece plans for  future health needs if something happens to either one of you

## 2013-10-10 NOTE — Progress Notes (Signed)
Quick Note:  Copy of labs sent to patient ______ 

## 2013-10-10 NOTE — Progress Notes (Signed)
Subjective:    Patient ID: [name], male    DOB: 03-Aug-1930, 79 y.o.   MRN: [id number]  HPI Pt here for follow up and management of chronic medical problems. On chart review patient has had recent visits with the cardiologist, the neurologist and also a visit to the emergency room because of increased tremor problems. According to the patient carotid Dopplers are planned by the cardiologist and a brain scan is planned by the neurologist.      Patient Active Problem List   Diagnosis Date Noted  . High risk medication use 06/22/2013  . Hypokalemia 06/22/2013  . Memory deficits 05/05/2013  . Essential and other specified forms of tremor 05/05/2013  . Cerebrovascular disease, unspecified 01/16/2013  . CAD (coronary artery disease)   . BPH (benign prostatic hypertrophy) 02/11/2011  . Premature atrial complexes 02/11/2011  . Panic attack 02/11/2011  . Diabetes mellitus 02/11/2011  . Hyperlipidemia 02/12/2010  . FIBRILLATION, ATRIAL 10/30/2009  . GERD 03/12/2009  . ULCERATIVE COLITIS-LEFT SIDE 03/12/2009   Outpatient Encounter Prescriptions as of 10/10/2013  Medication Sig  . aspirin EC 81 MG tablet Take 81 mg by mouth daily.  Marland Kitchen atorvastatin (LIPITOR) 40 MG tablet TAKE 1 TABLET (40 MG TOTAL) BY MOUTH AT BEDTIME.  Marland Kitchen Cholecalciferol (VITAMIN D) 1000 UNITS capsule Take 1 capsule (1,000 Units total) by mouth daily.  . [facility] B-12 1000 MCG TBCR TAKE 1 TABLET BY MOUTH EVERY DAY  . digoxin (LANOXIN) 0.125 MG tablet TAKE 1 TABLET EVERY DAY  . LIALDA 1.2 G EC tablet TAKE 1 TABLET (1.2 G TOTAL) BY MOUTH 2 (TWO) TIMES DAILY.  . mesalamine (APRISO) 0.375 G 24 hr capsule Take 375 mg by mouth daily.  . metoprolol (LOPRESSOR) 100 MG tablet TAKE ONE AND A HALF TABLETS BY MOUTH TWICE DAILY  . MYRBETRIQ 25 MG TB24 tablet   . niacin (NIASPAN) 500 MG CR tablet Take 500 mg by mouth daily.  . nitroGLYCERIN (NITROSTAT) 0.3 MG SL tablet Place 0.3 mg under the tongue every 5 (five) minutes as needed.  Never had to use   . omeprazole (PRILOSEC) 20 MG capsule Take 20 mg by mouth daily.   Marland Kitchen PARoxetine (PAXIL) 20 MG tablet TAKE ONE AND A HALF TABLETS BY MOUTH EVERY DAY  . potassium chloride SA (K-DUR,KLOR-CON) 20 MEQ tablet Take 10 mEq by mouth daily.  . Rivaroxaban (XARELTO) 20 MG TABS Take 1 tablet (20 mg total) by mouth daily.  . silodosin (RAPAFLO) 8 MG CAPS capsule Take 8 mg by mouth daily.    . vitamin B-12 (CYANOCOBALAMIN) 1000 MCG tablet Take 1,000 mcg by mouth daily.    Review of Systems  Constitutional: Negative.   HENT: Negative.   Eyes: Negative.   Respiratory: Negative.   Cardiovascular: Negative.   Gastrointestinal: Negative.   Endocrine: Negative.   Genitourinary: Negative.   Musculoskeletal: Negative.   Skin: Negative.   Allergic/Immunologic: Negative.   Neurological: Negative.   Hematological: Negative.   Psychiatric/Behavioral: Negative.        Objective:   Physical Exam  Nursing note and vitals reviewed. Constitutional: He is oriented to person, place, and time. He appears well-developed and well-nourished. No distress.  Patient has a parkinsonian tremor and rigidity bilaterally left being greater than right  HENT:  Head: Normocephalic and atraumatic.  Right Ear: External ear normal.  Left Ear: External ear normal.  Nose: Nose normal.  Mouth/Throat: Oropharynx is clear and moist. No oropharyngeal exudate.  Patient is wearing bilateral hearing aids and  these have helped him considerably  Eyes: Conjunctivae and EOM are normal. Pupils are equal, round, and reactive to light. Right eye exhibits no discharge. Left eye exhibits no discharge. No scleral icterus.  Neck: Normal range of motion. Neck supple. No tracheal deviation present. No thyromegaly present.  Cardiovascular: Normal rate, normal heart sounds and intact distal pulses.  Exam reveals no gallop and no friction rub.   No murmur heard. Pulmonary/Chest: Effort normal and breath sounds normal. No  respiratory distress. He has no wheezes. He has no rales.  Abdominal: Soft. Bowel sounds are normal. He exhibits no mass. There is no tenderness. There is no rebound and no guarding.  Musculoskeletal: Normal range of motion. He exhibits no edema and no tenderness.  Lymphadenopathy:    He has no cervical adenopathy.  Neurological: He is alert and oriented to person, place, and time. He has normal reflexes. No cranial nerve deficit.  Patient has a resting pill rolling tremor bilaterally with rigidity   Skin: Skin is warm and dry. No rash noted. No erythema. No pallor.  Psychiatric: He has a normal mood and affect. His behavior is normal. Judgment and thought content normal.   BP 124/75  Pulse 96  Temp(Src) 97.6 F (36.4 C) (Oral)  Ht 5\' 10"  (1.778 m)  Wt 183 lb (83.008 kg)  BMI 26.26 kg/m2        Assessment & Plan:   1. BPH (benign prostatic hypertrophy)   2. CAD (coronary artery disease)   3. Diabetes mellitus   4. FIBRILLATION, ATRIAL   5. GERD   6. Hyperlipidemia   7. Vitamin D deficiency   8. Essential and other specified forms of tremor    Orders Placed This Encounter  Procedures  . DG Chest 2 View    Standing Status: Future     Number of Occurrences: 1     Standing Expiration Date: 12/10/2014    Order Specific Question:  Reason for Exam (SYMPTOM  OR DIAGNOSIS REQUIRED)    Answer:  cad    Order Specific Question:  Preferred imaging location?    Answer:  Internal  . BMP8+EGFR  . Hepatic function panel  . NMR, lipoprofile  . Vit D  25 hydroxy (rtn osteoporosis monitoring)  . POCT CBC  . POCT glycosylated hemoglobin (Hb A1C)   No orders of the defined types were placed in this encounter.   Patient Instructions  Continue current medications. Continue good therapeutic lifestyle changes which include good diet and exercise. Fall precautions discussed with patient. Schedule your flu vaccine if you haven't had it yet If you are over 61 years old - you may need  Prevnar 13 or the adult Pneumonia vaccine. Please discuss with your niece plans for  future health needs if something happens to either one of you    Nyra Capes MD

## 2013-10-12 ENCOUNTER — Other Ambulatory Visit: Payer: Medicare Other | Admitting: Radiology

## 2013-10-12 LAB — BMP8+EGFR
BUN/Creatinine Ratio: 9 — ABNORMAL LOW (ref 10–22)
BUN: 9 mg/dL (ref 8–27)
CO2: 28 mmol/L (ref 18–29)
Calcium: 9 mg/dL (ref 8.6–10.2)
Chloride: 91 mmol/L — ABNORMAL LOW (ref 97–108)
Creatinine, Ser: 0.97 mg/dL (ref 0.76–1.27)
GFR calc Af Amer: 85 mL/min/{1.73_m2} (ref >59)
GFR calc non Af Amer: 73 mL/min/{1.73_m2} (ref >59)
Glucose: 110 mg/dL — ABNORMAL HIGH (ref 65–99)
Potassium: 4.8 mmol/L (ref 3.5–5.2)

## 2013-10-12 LAB — HEPATIC FUNCTION PANEL
AST: 18 IU/L (ref 0–40)
Albumin: 4.4 g/dL (ref 3.5–4.7)
Alkaline Phosphatase: 93 IU/L (ref 39–117)

## 2013-10-12 LAB — NMR, LIPOPROFILE
HDL Cholesterol by NMR: 45 mg/dL (ref >=40)
LDL Particle Number: 614 nmol/L (ref <1000)
LDLC SERPL CALC-MCNC: 48 mg/dL (ref <100)
LP-IR Score: <25 (ref <=45)

## 2013-10-12 LAB — VITAMIN D 25 HYDROXY (VIT D DEFICIENCY, FRACTURES): Vit D, 25-Hydroxy: 43.4 ng/mL (ref 30.0–100.0)

## 2013-10-17 ENCOUNTER — Ambulatory Visit (INDEPENDENT_AMBULATORY_CARE_PROVIDER_SITE_OTHER): Payer: Medicare Other

## 2013-10-17 ENCOUNTER — Telehealth: Payer: Self-pay | Admitting: Neurology

## 2013-10-17 DIAGNOSIS — G250 Essential tremor: Secondary | ICD-10-CM

## 2013-10-17 DIAGNOSIS — G459 Transient cerebral ischemic attack, unspecified: Secondary | ICD-10-CM

## 2013-10-17 DIAGNOSIS — R413 Other amnesia: Secondary | ICD-10-CM

## 2013-10-17 DIAGNOSIS — R4701 Aphasia: Secondary | ICD-10-CM

## 2013-10-17 NOTE — Procedures (Signed)
     [name] is an 79 year old gentleman with a history of several episodes of transient aphasia. The patient has had a total of 3 episodes, and he is on Xarelto and aspirin. A recent CT scan of the brain has shown no acute changes. The patient is being evaluated for the episodic speech dysfunction.  This is a routine EEG. No skull defects are noted. Medications include aspirin, Lipitor, vitamin D, digoxin, Lialda, metoprolol, Myrbetriq, niacin, Prilosec, Paxil, potassium, Xarelto, Rapaflo, and vitamin B12.  EEG classification: Dysrhythmia grade 1 left hemisphere  Description of the recording: The background rhythms of this recording consists of a fairly well modulated medium amplitude alpha rhythm of 7-8 Hz that is reactive to eye opening and closure. As the record progresses, intermittent 6 Hz theta frequency slowing is seen emanating from the left hemisphere. At no time during the recording does there appear to be evidence of spike or spike wave discharges. Hyperventilation was not performed, but photic stimulation was done, and this results in a bilateral photic driving response. EKG monitor shows an occasionally irregular heart rhythm with a heart rate of 90.  Impression: This is an abnormal EEG recording due to episodes of mild theta frequency slowing emanating from the left hemisphere. This study suggests dysfunction within the left hemisphere the brain, no clear evidence of epileptiform discharges are seen.

## 2013-10-17 NOTE — Telephone Encounter (Signed)
I called the patient. The EEG showed some intermittent mild left-sided slowing. Clinical significance of this is not clear. The carotid Doppler study is pending. If high-grade stenosis on the left is not present, the patient could potentially require a low dose of an anticonvulsant medication for the episodes of aphasia. I'll call him when I get the results of the Doppler study.

## 2013-10-24 ENCOUNTER — Ambulatory Visit (INDEPENDENT_AMBULATORY_CARE_PROVIDER_SITE_OTHER): Payer: Medicare Other

## 2013-10-24 DIAGNOSIS — R4701 Aphasia: Secondary | ICD-10-CM

## 2013-10-24 DIAGNOSIS — G459 Transient cerebral ischemic attack, unspecified: Secondary | ICD-10-CM

## 2013-10-26 ENCOUNTER — Telehealth: Payer: Self-pay | Admitting: Neurology

## 2013-10-26 NOTE — Telephone Encounter (Signed)
I called patient. The carotid Doppler study shows similar findings to what was seen in March of 2014. There is 50-69% stenosis of the left distal internal carotid artery. No change from prior study. The EEG study showed some slowing in the left brain. The patient continues to have episodes of aphasia, I would initiate anticonvulsant therapy. The patient will call me if any further problems arise.

## 2013-10-31 ENCOUNTER — Other Ambulatory Visit: Payer: Self-pay

## 2013-10-31 MED ORDER — MESALAMINE 1.2 G PO TBEC: DELAYED_RELEASE_TABLET | ORAL | Status: DC

## 2013-11-17 ENCOUNTER — Ambulatory Visit: Payer: Medicare Other | Admitting: Neurology

## 2013-12-02 ENCOUNTER — Other Ambulatory Visit: Payer: Self-pay | Admitting: Gastroenterology

## 2013-12-04 NOTE — Telephone Encounter (Signed)
NEEDS OFFICE VISIT SCHEDULED!!

## 2013-12-14 ENCOUNTER — Other Ambulatory Visit: Payer: Self-pay | Admitting: Family Medicine

## 2013-12-19 ENCOUNTER — Other Ambulatory Visit: Payer: Self-pay | Admitting: Family Medicine

## 2014-01-03 ENCOUNTER — Other Ambulatory Visit: Payer: Self-pay | Admitting: Gastroenterology

## 2014-01-10 ENCOUNTER — Other Ambulatory Visit: Payer: Medicare Other

## 2014-01-10 DIAGNOSIS — Z1212 Encounter for screening for malignant neoplasm of rectum: Secondary | ICD-10-CM

## 2014-01-11 ENCOUNTER — Encounter: Payer: Self-pay | Admitting: *Deleted

## 2014-01-11 LAB — FECAL OCCULT BLOOD, IMMUNOCHEMICAL: Fecal Occult Bld: NEGATIVE

## 2014-01-11 NOTE — Progress Notes (Signed)
Quick Note:  Copy of labs sent to patient ______ 

## 2014-01-22 ENCOUNTER — Ambulatory Visit (INDEPENDENT_AMBULATORY_CARE_PROVIDER_SITE_OTHER): Payer: Medicare Other | Admitting: Gastroenterology

## 2014-01-22 ENCOUNTER — Encounter: Payer: Self-pay | Admitting: Gastroenterology

## 2014-01-22 VITALS — BP 120/78 | HR 80 | Ht 70.0 in | Wt 174.6 lb

## 2014-01-22 DIAGNOSIS — K5150 Left sided colitis without complications: Secondary | ICD-10-CM

## 2014-01-22 MED ORDER — MESALAMINE 1.2 G PO TBEC: DELAYED_RELEASE_TABLET | ORAL | Status: DC

## 2014-01-22 NOTE — Progress Notes (Signed)
    History of Present Illness: This is an 80 year old male with left sided ulcerative colitis. He returns for followup today stating he has had looser stools over the past several days. He states he ran out of Lialda a couple weeks ago and a week later his stools have been looser. He  Current Medications, Allergies, Past Medical History, Past Surgical History, Family History and Social History were reviewed in [facility] electronic medical record.  Physical Exam: General: Well developed , well nourished, no acute distress Head: Normocephalic and atraumatic Eyes:  sclerae anicteric, EOMI Ears: Normal auditory acuity Mouth: No deformity or lesions Lungs: Clear throughout to auscultation Heart: Regular rate and rhythm; no murmurs, rubs or bruits Abdomen: Soft, non tender and non distended. No masses, hepatosplenomegaly or hernias noted. Normal Bowel sounds Musculoskeletal: Symmetrical with no gross deformities  Pulses:  Normal pulses noted Extremities: No clubbing, cyanosis, edema or deformities noted Neurological: Alert oriented x 4, grossly nonfocal Psychological:  Alert and cooperative. Normal mood and affect  Assessment and Recommendations:  Left-sided ulcerative colitis. Occasional diarrhea with occasional incontinence. Resume Lialda 1.2g twice a day. The patient states he will have blood work performed in Dr. Kathi DerMoore's office next week. Given his CVA and TIA he has a higher risk for holding anticoagulants to allow for colonoscopy therefore we are deferring screening and surveillance colonoscopies. Return office visit in 6 months.

## 2014-01-22 NOTE — Patient Instructions (Signed)
We have sent the following medications to your pharmacy for you to pick up at your convenience: Lialda. Samples and a coupon also given to patient.   Thank you for choosing me and  Gastroenterology.  Venita LickMalcolm T. Pleas KochStark, Jr., MD., Clementeen GrahamFACG

## 2014-01-23 ENCOUNTER — Other Ambulatory Visit: Payer: Self-pay | Admitting: Family Medicine

## 2014-01-29 ENCOUNTER — Ambulatory Visit: Payer: Self-pay | Admitting: Family Medicine

## 2014-01-31 ENCOUNTER — Other Ambulatory Visit: Payer: Self-pay | Admitting: Family Medicine

## 2014-02-12 ENCOUNTER — Other Ambulatory Visit: Payer: Self-pay | Admitting: *Deleted

## 2014-02-12 ENCOUNTER — Other Ambulatory Visit (INDEPENDENT_AMBULATORY_CARE_PROVIDER_SITE_OTHER): Payer: Medicare Other

## 2014-02-12 DIAGNOSIS — N400 Enlarged prostate without lower urinary tract symptoms: Secondary | ICD-10-CM

## 2014-02-12 DIAGNOSIS — E559 Vitamin D deficiency, unspecified: Secondary | ICD-10-CM

## 2014-02-12 DIAGNOSIS — E119 Type 2 diabetes mellitus without complications: Secondary | ICD-10-CM

## 2014-02-12 DIAGNOSIS — E785 Hyperlipidemia, unspecified: Secondary | ICD-10-CM

## 2014-02-12 DIAGNOSIS — I4891 Unspecified atrial fibrillation: Secondary | ICD-10-CM

## 2014-02-12 DIAGNOSIS — I2510 Atherosclerotic heart disease of native coronary artery without angina pectoris: Secondary | ICD-10-CM

## 2014-02-12 DIAGNOSIS — K219 Gastro-esophageal reflux disease without esophagitis: Secondary | ICD-10-CM

## 2014-02-12 LAB — POCT CBC
GRANULOCYTE PERCENT: 73.9 % (ref 37–80)
HCT, POC: 39.3 % — AB (ref 43.5–53.7)
HEMOGLOBIN: 12.7 g/dL — AB (ref 14.1–18.1)
LYMPH, POC: 1.3 (ref 0.6–3.4)
MCH, POC: 31.2 pg (ref 27–31.2)
MCHC: 32.3 g/dL (ref 31.8–35.4)
MCV: 96.5 fL (ref 80–97)
MPV: 7.9 fL (ref 0–99.8)
PLATELET COUNT, POC: 132 10*3/uL — AB (ref 142–424)
POC Granulocyte: 4 (ref 2–6.9)
POC LYMPH %: 24.6 % (ref 10–50)
RBC: 4.1 M/uL — AB (ref 4.69–6.13)
RDW, POC: 13.6 %
WBC: 5.4 10*3/uL (ref 4.6–10.2)

## 2014-02-12 LAB — POCT GLYCOSYLATED HEMOGLOBIN (HGB A1C): Hemoglobin A1C: 6.1

## 2014-02-13 LAB — BMP8+EGFR
BUN / CREAT RATIO: 11 (ref 10–22)
BUN: 11 mg/dL (ref 8–27)
CO2: 28 mmol/L (ref 18–29)
Calcium: 8.6 mg/dL (ref 8.6–10.2)
Chloride: 94 mmol/L — ABNORMAL LOW (ref 97–108)
Creatinine, Ser: 0.97 mg/dL (ref 0.76–1.27)
GFR calc non Af Amer: 73 mL/min/{1.73_m2} (ref >59)
GFR, EST AFRICAN AMERICAN: 84 mL/min/{1.73_m2} (ref >59)
Glucose: 97 mg/dL (ref 65–99)
POTASSIUM: 4.4 mmol/L (ref 3.5–5.2)
Sodium: 138 mmol/L (ref 134–144)

## 2014-02-13 LAB — LIPID PANEL
Chol/HDL Ratio: 2.2 ratio units (ref 0.0–5.0)
Cholesterol, Total: 110 mg/dL (ref 100–199)
HDL: 51 mg/dL (ref >39)
LDL CALC: 47 mg/dL (ref 0–99)
Triglycerides: 61 mg/dL (ref 0–149)
VLDL Cholesterol Cal: 12 mg/dL (ref 5–40)

## 2014-02-13 LAB — HEPATIC FUNCTION PANEL
ALBUMIN: 4.1 g/dL (ref 3.5–4.7)
ALK PHOS: 96 IU/L (ref 39–117)
ALT: 12 IU/L (ref 0–44)
AST: 15 IU/L (ref 0–40)
BILIRUBIN DIRECT: 0.29 mg/dL (ref 0.00–0.40)
TOTAL PROTEIN: 6.8 g/dL (ref 6.0–8.5)
Total Bilirubin: 0.8 mg/dL (ref 0.0–1.2)

## 2014-02-15 ENCOUNTER — Other Ambulatory Visit: Payer: Self-pay | Admitting: Family Medicine

## 2014-03-02 ENCOUNTER — Other Ambulatory Visit (INDEPENDENT_AMBULATORY_CARE_PROVIDER_SITE_OTHER): Payer: Medicare Other

## 2014-03-02 DIAGNOSIS — E119 Type 2 diabetes mellitus without complications: Secondary | ICD-10-CM

## 2014-03-02 LAB — GLUCOSE, POCT (MANUAL RESULT ENTRY): POC GLUCOSE: 110 mg/dL — AB (ref 70–99)

## 2014-03-02 NOTE — Progress Notes (Signed)
Pt came in for lab only wanted bs checked thinking it might be up

## 2014-03-09 ENCOUNTER — Other Ambulatory Visit: Payer: Medicare Other

## 2014-03-09 LAB — POCT GLYCOSYLATED HEMOGLOBIN (HGB A1C): HEMOGLOBIN A1C: 5.9

## 2014-03-09 NOTE — Addendum Note (Signed)
Addended by: Prescott GumLAND, Sirenia Whitis M on: 03/09/2014 02:56 PM   Modules accepted: Orders

## 2014-03-17 ENCOUNTER — Other Ambulatory Visit: Payer: Self-pay | Admitting: Family Medicine

## 2014-04-02 ENCOUNTER — Ambulatory Visit (INDEPENDENT_AMBULATORY_CARE_PROVIDER_SITE_OTHER): Payer: Medicare Other | Admitting: Pharmacist

## 2014-04-02 DIAGNOSIS — I2510 Atherosclerotic heart disease of native coronary artery without angina pectoris: Secondary | ICD-10-CM

## 2014-04-02 DIAGNOSIS — I4891 Unspecified atrial fibrillation: Secondary | ICD-10-CM

## 2014-04-02 MED ORDER — WARFARIN SODIUM 5 MG PO TABS: ORAL_TABLET | ORAL | Status: DC

## 2014-04-02 NOTE — Progress Notes (Signed)
Patient was taking Xarelto for A fib but reports today that he cannot afford unless we have samples and would like to change back to warfarin.  He has been out of Xarelto for the last 3 days.   He has a history of gastric ulcer but has not experienced any bleed while taking Xarelto  Xarelto discontinued Started warfarin mg 1 tablet daily RTC in 3 days to recheck INR.   Henrene Pastorammy Kenesha Moshier, PharmD, CPP

## 2014-04-04 ENCOUNTER — Telehealth: Payer: Self-pay | Admitting: Family Medicine

## 2014-04-04 ENCOUNTER — Ambulatory Visit (INDEPENDENT_AMBULATORY_CARE_PROVIDER_SITE_OTHER): Payer: Medicare Other | Admitting: Neurology

## 2014-04-04 ENCOUNTER — Encounter: Payer: Self-pay | Admitting: Neurology

## 2014-04-04 VITALS — BP 143/92 | HR 87 | Wt 177.0 lb

## 2014-04-04 DIAGNOSIS — I679 Cerebrovascular disease, unspecified: Secondary | ICD-10-CM

## 2014-04-04 DIAGNOSIS — I2510 Atherosclerotic heart disease of native coronary artery without angina pectoris: Secondary | ICD-10-CM

## 2014-04-04 DIAGNOSIS — G250 Essential tremor: Secondary | ICD-10-CM

## 2014-04-04 DIAGNOSIS — R413 Other amnesia: Secondary | ICD-10-CM

## 2014-04-04 DIAGNOSIS — G252 Other specified forms of tremor: Secondary | ICD-10-CM

## 2014-04-04 NOTE — Telephone Encounter (Signed)
Assisted living should have occurred a long time ago. Both patient's have been unwilling but maybe now with the driving restrictions they may be more open to considering this. If the clinical pharmacist sees the patient before I did, please call me in the room and I will discuss this with both of them. Otherwise I'll discuss it with them when I see them next time.

## 2014-04-04 NOTE — Patient Instructions (Signed)
Dementia Dementia is a word that is used to describe problems with the brain and how it works. People with dementia have memory loss. They may also have problems with thinking, speaking, or solving problems. It can affect how they act around people, how they do their job, their mood, and their personality. These changes may not show up for a long time. Family or friends may not notice problems in the early part of this disease. HOME CARE The following tips are for the person living with, or caring for, the person with dementia. Make the home safe.  Remove locks on bathroom doors.  Use childproof locks on cabinets where alcohol, cleaning supplies, or chemicals are stored.  Put outlet covers in electrical outlets.  Put in childproof locks to keep doors and windows safe.  Remove stove knobs, or put in safety knobs that shut off on their own.  Lower the temperature on water heaters.  Label medicines. Lock them in a safe place.  Keep knives, lighters, matches, power tools, and guns out of reach or in a safe place.  Remove objects that might break or can hurt the person.  Make sure lighting is good inside and outside.  Put in grab bars if needed.  Use a device that detects falls or other needs for help. Lessen confusion.  Keep familiar objects and people around.  Use night lights or low lit (dim) lights at night.  Label objects or areas.  Use reminders, notes, or directions for daily activities or tasks.  Keep a simple routine that is the same for waking, meals, bathing, dressing, and bedtime.  Create a calm and quiet home.  Put up clocks and calendars.  Keep emergency numbers and the home address near all phones.  Help show the different times of day. Open the curtains during the day to let light in. Speak clearly and directly.  Choose simple words and short sentences.  Use a gentle, calm voice.  Do not interrupt.  If the person has a hard time finding a word to  use, give them the word or thought.  Ask 1 question at a time. Give enough time for the person to answer. Repeat the question if the person does not answer. Do things that lessen restlessness.  Provide a comfortable bed.  Have the same bedtime routine every night.  Have a regular walking and activity schedule.  Lessen naps during the day.  Do not let the person drink a lot of caffeine.  Go to events that are not overwhelming. Eat well and drink fluids.  Lessen distractions during meal times and snacks.  Avoid foods that are too hot or too cold.  Watch how the person chews and swallows. This is to make sure they do not choke. Other  Keep all vision, hearing, dental, and medical visits with the doctor.  Only give medicines as told by the doctor.  Watch the person's driving ability. Do not let the person drive if he or she cannot drive safely.  Use a program that helps find a person if they become missing. You may need to register with this program. GET HELP RIGHT AWAY IF:   A fever of 102 F (38.9 C) develops.  Confusion develops or gets worse.  Sleepiness develops or gets worse.  Staying awake is hard to do.  New behavior problems start like mood swings, aggression, and seeing things that are not there.  Problems with balance, speech, or falling develop.  Problems swallowing develop.  Any   problems of another sickness develop. MAKE SURE YOU:  Understand these instructions.  Will watch his or her condition.  Will get help right away if he or she is not doing well or gets worse. Document Released: 10/22/2008 Document Revised: 02/01/2012 Document Reviewed: 04/06/2011 [employer] Patient Information 2014 [employer].  

## 2014-04-04 NOTE — Progress Notes (Signed)
PATIENT: [name] DOB: [birth year]  REASON FOR VISIT: follow up HISTORY FROM: patient  HISTORY OF PRESENT ILLNESS: [name] is an 80 year old left-handed white male with a history of a memory disorder, an essential tremor and TIA. He returns today for follow-up. At the previous visit the patient was having transient episodes of aphasia. The patient had a prior EEG that showed left-brain slowing and a Doppler showed 50-69% stenosis of the left distal internal carotid artery. The patient is currently on coumadin. Since the last visit the patient has not had any more of those episodes. The patient states that the tremor has gotten worse and the patient's niece reports that she has noticed the tremor in his left leg. The patient states that he can live with it does not want medication. He uses a straw to drink his coffee so that he does not spill it. The patient feels that his memory is "not good." The family states that his memory has gotten worse. He denies having to give up anything due to his memory. The patient continues to operate a motor vehicle. He denies getting lost or any difficulty with driving. No new medical issues have come up since the last visit.   REVIEW OF SYSTEMS: Full 14 system review of systems performed and notable only for:  Constitutional: N/A  Eyes: N/A Ear/Nose/Throat: N/A  Skin: N/A  Cardiovascular: N/A  Respiratory: N/A  Gastrointestinal: N/A  Genitourinary: N/A Hematology/Lymphatic: bruise/bleed easily Endocrine: N/A Musculoskeletal:N/A  Allergy/Immunology: environmental allergies  Neurological: memory loss, tremors Sleep: N/A   ALLERGIES: Allergies  Allergen Reactions  . Asa-Apap-Salicyl-Caff     Takes warfarin  . Codeine Other (See Comments)    Unknown reaction  . Nsaids Other (See Comments)    On warfarin  . Sulfonamide Derivatives Nausea And Vomiting    HOME MEDICATIONS: Outpatient Prescriptions Prior to Visit  Medication Sig Dispense  Refill  . Cholecalciferol (VITAMIN D) 1000 UNITS capsule Take 1 capsule (1,000 Units total) by mouth daily.      . [facility] B-12 1000 MCG TBCR TAKE 1 TABLET BY MOUTH EVERY DAY  60 tablet  11  . digoxin (LANOXIN) 0.125 MG tablet TAKE 1 TABLET EVERY DAY  30 tablet  2  . mesalamine (LIALDA) 1.2 G EC tablet Take 2 tablets by mouth daily  60 tablet  5  . metoprolol (LOPRESSOR) 100 MG tablet TAKE ONE AND A HALF TABLETS BY MOUTH TWICE DAILY  90 tablet  1  . MYRBETRIQ 25 MG TB24 tablet       . niacin (NIASPAN) 500 MG CR tablet Take 500 mg by mouth daily.      . niacin (NIASPAN) 500 MG CR tablet TAKE 1 TABLET BY MOUTH DAILY  30 tablet  4  . nitroGLYCERIN (NITROSTAT) 0.3 MG SL tablet Place 0.3 mg under the tongue every 5 (five) minutes as needed. Never had to use       . omeprazole (PRILOSEC) 20 MG capsule Take 20 mg by mouth daily.       Marland Kitchen. PARoxetine (PAXIL) 20 MG tablet TAKE ONE AND A HALF TABLETS BY MOUTH EVERY DAY  45 tablet  2  . potassium chloride SA (K-DUR,KLOR-CON) 20 MEQ tablet Take 10 mEq by mouth daily.      . silodosin (RAPAFLO) 8 MG CAPS capsule Take 8 mg by mouth daily.        Marland Kitchen. warfarin (COUMADIN) 5 MG tablet Take 1 tablet daily or as directed by coumadin clinic  30 tablet  3  . aspirin EC 81 MG tablet Take 81 mg by mouth daily.      Marland Kitchen. atorvastatin (LIPITOR) 40 MG tablet TAKE 1 TABLET (40 MG TOTAL) BY MOUTH AT BEDTIME.  30 tablet  3  . digoxin (LANOXIN) 0.125 MG tablet TAKE 1 TABLET EVERY DAY  30 tablet  2  . metoprolol (LOPRESSOR) 100 MG tablet TAKE ONE AND A HALF TABLETS BY MOUTH TWICE DAILY  90 tablet  1   No facility-administered medications prior to visit.    PAST MEDICAL HISTORY: Past Medical History  Diagnosis Date  . PAF (paroxysmal atrial fibrillation)   . Dyslipidemia   . Anxiety   . GIB (gastrointestinal bleeding)   . Obesity   . UC (ulcerative colitis confined to rectum)     Left-sided  . BPH (benign prostatic hyperplasia)   . Diverticulosis   . GERD (gastroesophageal  reflux disease)   . Hemorrhoids   . Diabetes mellitus   . Tremor   . CAD (coronary artery disease)     Anterior MI and DES stenting 2004, Vfib arrest; left main 25% stenosis, LAD 80% stenosis, 99% stenosis, 25% circumflex stenosis, 25% ramus intermedius stenosis, 50-40% right coronary artery stenosis, 90% RV branch stenosis with right to left collaterals; EF 55% with mild naterior hypokinesis (underwent Taxus stenting at the time) . Stress perfusion study January 2012 EF 48% with old apical infarct  . Hyperlipidemia   . Hyperplastic colon polyp   . Myocardial infarction   . Hypertension   . Stroke   . Memory deficits 05/05/2013  . Essential and other specified forms of tremor 05/05/2013  . Hearing deficit     Wears hearing aids    PAST SURGICAL HISTORY: Past Surgical History  Procedure Laterality Date  . Hammer toe surgery      right  . Inguinal hernia repair      right, bilateral  . Coronary angioplasty with stent placement      Taxus stenting  . Cataract extraction w/phaco  10/27/2012    Procedure: CATARACT EXTRACTION PHACO AND INTRAOCULAR LENS PLACEMENT (IOC);  Surgeon: Gemma PayorKerry Hunt, MD;  Location: [facility];  Service: Ophthalmology;  Laterality: Left;  CDE 22.67    FAMILY HISTORY: Family History  Problem Relation Age of Onset  . Colon cancer Neg Hx   . Alzheimer's disease Brother   . Diabetes Brother     x 4  . Diabetes Mother     ?  . Diabetes Father   . Heart disease Father   . Heart disease Paternal Grandfather   . Heart disease Brother     x 3  . COPD Sister   . Diabetes Sister     SOCIAL HISTORY: History   Social History  . Marital Status: Married    Spouse Name: N/A    Number of Children: 1  . Years of Education: 12   Occupational History  . Retired    Social History Main Topics  . Smoking status: Former Smoker    Quit date: 11/23/1958  . Smokeless tobacco: Former NeurosurgeonUser    Types: Chew     Comment: Quit years ago  . Alcohol Use: No  . Drug Use: No    . Sexual Activity: Not on file   Other Topics Concern  . Not on file   Social History Narrative  . No narrative on file      PHYSICAL EXAM  Filed Vitals:   04/04/14 1349  BP: 143/92  Pulse: 87  Weight: 177 lb (80.287 kg)   Body mass index is 25.4 kg/(m^2).  Generalized: Well developed, in no acute distress   Neurological examination  Mentation: Alert oriented to time, place, history taking. Follows all commands speech and language fluent. MMSE 15/30 Cranial nerve II-XII:. Extraocular movements were full, visual field were full on confrontational test.  Motor: The motor testing reveals 5 over 5 strength of all 4 extremities. Good symmetric motor tone is noted throughout.  Sensory: Sensory testing is intact to soft touch on all 4 extremities. No evidence of extinction is noted.  Coordination: Cerebellar testing reveals good finger-nose-finger and heel-to-shin bilaterally. Tremor is noted in both upper extremities left greater than the right. A tremor affecting the jaw is also noted.  Gait and station: Gait is normal. Tremor noted in the left arm while ambulating. Tandem gait is normal. Romberg is negative. No drift is seen.  Reflexes: Deep tendon reflexes are symmetric and normal bilaterally.    DIAGNOSTIC DATA (LABS, IMAGING, TESTING) - I reviewed patient records, labs, notes, testing and imaging myself where available.  Lab Results  Component Value Date   WBC 5.4 02/12/2014   HGB 12.7* 02/12/2014   HCT 39.3* 02/12/2014   MCV 96.5 02/12/2014   PLT 143* 09/26/2013      Component Value Date/Time   NA 138 02/12/2014 1711   NA 133* 09/26/2013 1237   K 4.4 02/12/2014 1711   CL 94* 02/12/2014 1711   CO2 28 02/12/2014 1711   GLUCOSE 97 02/12/2014 1711   GLUCOSE 129* 09/26/2013 1237   BUN 11 02/12/2014 1711   BUN 9 09/26/2013 1237   CREATININE 0.97 02/12/2014 1711   CREATININE 0.98 02/16/2013 1636   CALCIUM 8.6 02/12/2014 1711   PROT 6.8 02/12/2014 1711   PROT 7.7 09/26/2013 1237    ALBUMIN 4.0 09/26/2013 1237   AST 15 02/12/2014 1711   ALT 12 02/12/2014 1711   ALKPHOS 96 02/12/2014 1711   BILITOT 0.8 02/12/2014 1711   GFRNONAA 73 02/12/2014 1711   GFRNONAA 73 02/16/2013 1636   GFRAA 84 02/12/2014 1711   GFRAA 84 02/16/2013 1636   Lab Results  Component Value Date   CHOL 104 10/10/2013   HDL 51 02/12/2014   LDLCALC 47 02/12/2014   TRIG 61 02/12/2014   CHOLHDL 2.2 02/12/2014   Lab Results  Component Value Date   HGBA1C 5.9 03/09/2014   No results found for this basename: VITAMINB12   Lab Results  Component Value Date   TSH 3.320 06/22/2013      ASSESSMENT AND PLAN 80 y.o. year old male  has a past medical history of PAF (paroxysmal atrial fibrillation); Dyslipidemia; Anxiety; GIB (gastrointestinal bleeding); Obesity; UC (ulcerative colitis confined to rectum); BPH (benign prostatic hyperplasia); Diverticulosis; GERD (gastroesophageal reflux disease); Hemorrhoids; Diabetes mellitus; Tremor; CAD (coronary artery disease); Hyperlipidemia; Hyperplastic colon polyp; Myocardial infarction; Hypertension; Stroke; Memory deficits (05/05/2013); Essential and other specified forms of tremor (05/05/2013); and Hearing deficit. here with:  1. Essential and other specified forms of tremor 2. Memory deficits 3. Cerebrovascular disease, unspecified  The patient's tremors have remained stable. He states that he does not want medication for the tremors. His memory has declined since the last visit, his MMSE is 15/30. The patient was tried on Aricept and Exelon patch but patient could not tolerate. We considered Namenda, however the patient deferred due to financial reasons. I strongly encouraged the patient to give up driving due to his cognitive function. The family has also  been encouraging him to stop driving. At this time patient refused to have assistance in the home. I encouraged him to consider this as an option. I feel it would be very beneficial to him and his wife. His wife suffers  from severe dementia according to family. The patient can follow up as needed.    Butch Penny, MSN, NP-C 04/04/2014, 2:15 PM [facility] [address] [place], [place] [address] [phone]  Note: This document was prepared with digital dictation and possible smart phrase technology. Any transcriptional errors that result from this process are unintentional. York Spaniel

## 2014-04-04 NOTE — Telephone Encounter (Signed)
Noted will forward to Dr Christell ConstantMoore

## 2014-04-05 ENCOUNTER — Ambulatory Visit (INDEPENDENT_AMBULATORY_CARE_PROVIDER_SITE_OTHER): Payer: Medicare Other | Admitting: Pharmacist

## 2014-04-05 DIAGNOSIS — I4891 Unspecified atrial fibrillation: Secondary | ICD-10-CM

## 2014-04-05 LAB — POCT INR: INR: 1.8

## 2014-04-05 NOTE — Patient Instructions (Signed)
Stop Niacin Er / Niaspan Stop Aspirin 81mg   Anticoagulation Dose Instructions as of 04/05/2014     Glynis SmilesSun Mon Tue Wed Thu Fri Sat   New Dose 5 mg 2.5 mg 2.5 mg 5 mg 5 mg 2.5 mg 2.5 mg   Alt Week 5 mg 2.5 mg 2.5 mg 5 mg 2.5 mg 2.5 mg 2.5 mg    Description       Take 1 warfarin 5mg  today (thursday, May 14th), then Change warfarin 5mg  to 1/2 tablet daily except 1 tablet sundays and wednesdays       INR was 1.8 today

## 2014-04-05 NOTE — Progress Notes (Signed)
Patient will start bringing medication boxes for me to fill.  Discussed assisted living arrangement and patient has stated that he does not want to leave his home.  He is looking into options for someone to help he with driving / running errands / medical appointments.

## 2014-04-11 ENCOUNTER — Ambulatory Visit (INDEPENDENT_AMBULATORY_CARE_PROVIDER_SITE_OTHER): Payer: Medicare Other | Admitting: Pharmacist

## 2014-04-11 DIAGNOSIS — I4891 Unspecified atrial fibrillation: Secondary | ICD-10-CM

## 2014-04-11 LAB — POCT INR: INR: 3.7

## 2014-04-11 NOTE — Patient Instructions (Signed)
Anticoagulation Dose Instructions as of 04/11/2014     Glynis SmilesSun Mon Tue Wed Thu Fri Sat   New Dose 2.5 mg 2.5 mg 2.5 mg 2.5 mg 2.5 mg 2.5 mg 2.5 mg    Description       No warfarin today, then decrease to 1/2 tablet daily (= 2.5mg )

## 2014-04-18 ENCOUNTER — Ambulatory Visit (INDEPENDENT_AMBULATORY_CARE_PROVIDER_SITE_OTHER): Payer: Medicare Other | Admitting: Pharmacist

## 2014-04-18 ENCOUNTER — Encounter: Payer: Self-pay | Admitting: Pharmacist

## 2014-04-18 DIAGNOSIS — I2510 Atherosclerotic heart disease of native coronary artery without angina pectoris: Secondary | ICD-10-CM

## 2014-04-18 DIAGNOSIS — I4891 Unspecified atrial fibrillation: Secondary | ICD-10-CM

## 2014-04-18 LAB — POCT INR: INR: 3.4

## 2014-04-18 MED ORDER — METOPROLOL TARTRATE 100 MG PO TABS: 150.0000 mg | ORAL_TABLET | Freq: Two times a day (BID) | ORAL | Status: DC

## 2014-04-18 NOTE — Progress Notes (Signed)
See anticoagulation note - also filled pill boxes for 2 weeks.

## 2014-04-18 NOTE — Patient Instructions (Signed)
Anticoagulation Dose Instructions as of 04/18/2014     Glynis Smiles Tue Wed Thu Fri Sat   New Dose 2.5 mg 2.5 mg 2.5 mg Hold 2.5 mg 2.5 mg 2.5 mg    Description       No warfarin for 1 day, then decrease warfarin to 1/2 tablet daily (= 2.5mg ) except none on wednesdays.      INR was 3.4 today

## 2014-05-02 ENCOUNTER — Other Ambulatory Visit: Payer: Self-pay | Admitting: Family Medicine

## 2014-05-02 ENCOUNTER — Ambulatory Visit (INDEPENDENT_AMBULATORY_CARE_PROVIDER_SITE_OTHER): Payer: Medicare Other | Admitting: Pharmacist

## 2014-05-02 DIAGNOSIS — I4891 Unspecified atrial fibrillation: Secondary | ICD-10-CM

## 2014-05-02 LAB — POCT INR: INR: 1.7

## 2014-05-02 NOTE — Patient Instructions (Signed)
Anticoagulation Dose Instructions as of 05/02/2014     [name] Tue Wed Thu Fri Sat   New Dose 2.5 mg 2.5 mg 2.5 mg 2.5 mg 2.5 mg 2.5 mg 2.5 mg    Description       Increase to warfarin 5mg  1/2 tablet daily      INR was 1.7 today

## 2014-05-03 NOTE — Telephone Encounter (Signed)
Last seen 10/10/13  DWM

## 2014-05-10 ENCOUNTER — Ambulatory Visit (INDEPENDENT_AMBULATORY_CARE_PROVIDER_SITE_OTHER): Payer: Medicare Other | Admitting: Pharmacist

## 2014-05-10 ENCOUNTER — Encounter: Payer: Self-pay | Admitting: Pharmacist

## 2014-05-10 DIAGNOSIS — I4891 Unspecified atrial fibrillation: Secondary | ICD-10-CM

## 2014-05-10 DIAGNOSIS — I2510 Atherosclerotic heart disease of native coronary artery without angina pectoris: Secondary | ICD-10-CM

## 2014-05-10 LAB — POCT INR: INR: 1.6

## 2014-05-10 NOTE — Patient Instructions (Signed)
Anticoagulation Dose Instructions as of 05/10/2014     Gregory SmilesSun Mon Tue Wed Thu Fri Sat   New Dose 2.5 mg 5 mg 2.5 mg 2.5 mg 2.5 mg 5 mg 2.5 mg    Description       Increase to warfarin 5mg  1/2 tablet daily except 1 tablet on Monday and thrusdays       INR was 1.6 today

## 2014-05-10 NOTE — Progress Notes (Signed)
See anticoagulation note. Also fill patient's pillboxes and his wife's pill boxes

## 2014-05-24 ENCOUNTER — Encounter: Payer: Self-pay | Admitting: Pharmacist

## 2014-05-24 ENCOUNTER — Other Ambulatory Visit: Payer: Self-pay | Admitting: Nurse Practitioner

## 2014-05-24 ENCOUNTER — Ambulatory Visit (INDEPENDENT_AMBULATORY_CARE_PROVIDER_SITE_OTHER): Payer: Medicare Other | Admitting: Pharmacist

## 2014-05-24 DIAGNOSIS — I2510 Atherosclerotic heart disease of native coronary artery without angina pectoris: Secondary | ICD-10-CM

## 2014-05-24 DIAGNOSIS — I4891 Unspecified atrial fibrillation: Secondary | ICD-10-CM

## 2014-05-24 LAB — POCT INR: INR: 3

## 2014-05-24 NOTE — Patient Instructions (Signed)
Anticoagulation Dose Instructions as of 05/24/2014     Gregory SmilesSun Mon Tue Wed Thu Fri Sat   New Dose 2.5 mg 5 mg 2.5 mg 2.5 mg 2.5 mg 2.5 mg 2.5 mg    Description       Continue warfarin 5mg  1/2 tablet daily except 1 tablet on Mondays.      INR was 3.0 today

## 2014-05-24 NOTE — Progress Notes (Signed)
Patient's pill boxes filled and INR checked - see anticoagulation note

## 2014-06-06 ENCOUNTER — Ambulatory Visit (INDEPENDENT_AMBULATORY_CARE_PROVIDER_SITE_OTHER): Payer: Medicare Other | Admitting: Cardiology

## 2014-06-06 ENCOUNTER — Encounter: Payer: Self-pay | Admitting: Cardiology

## 2014-06-06 VITALS — BP 118/78 | HR 88 | Ht 71.0 in | Wt 167.0 lb

## 2014-06-06 DIAGNOSIS — I2510 Atherosclerotic heart disease of native coronary artery without angina pectoris: Secondary | ICD-10-CM

## 2014-06-06 DIAGNOSIS — I4819 Other persistent atrial fibrillation: Secondary | ICD-10-CM

## 2014-06-06 DIAGNOSIS — I4891 Unspecified atrial fibrillation: Secondary | ICD-10-CM

## 2014-06-06 NOTE — Patient Instructions (Signed)
The current medical regimen is effective;  continue present plan and medications.  Follow up in 3 months with Dr Hochrein.  You will receive a letter in the mail 2 months before you are due.  Please call us when you receive this letter to schedule your follow up appointment.   

## 2014-06-06 NOTE — Progress Notes (Signed)
HPI The patient returns for follow up of CAD.  Since I last saw him he has done OK.   He is exercising routinely and exercised for two hours today.  The patient denies any new symptoms such as chest discomfort, neck or arm discomfort. There has been no new shortness of breath, PND or orthopnea. There have been no reported palpitations, presyncope or syncope.   Allergies  Allergen Reactions  . Asa-Apap-Salicyl-Caff     Takes warfarin  . Codeine Other (See Comments)    Unknown reaction  . Nsaids Other (See Comments)    On warfarin  . Sulfonamide Derivatives Nausea And Vomiting    Current Outpatient Prescriptions  Medication Sig Dispense Refill  . atorvastatin (LIPITOR) 40 MG tablet TAKE 1 TABLET (40 MG TOTAL) BY MOUTH AT BEDTIME.  30 tablet  3  . Cholecalciferol (VITAMIN D) 1000 UNITS capsule Take 1 capsule (1,000 Units total) by mouth daily.      . [facility]S B-12 1000 MCG TBCR TAKE 1 TABLET BY MOUTH EVERY DAY  60 tablet  11  . digoxin (LANOXIN) 0.125 MG tablet TAKE 1 TABLET EVERY DAY  30 tablet  2  . mesalamine (LIALDA) 1.2 G EC tablet Take 2 tablets by mouth daily  60 tablet  5  . metoprolol (LOPRESSOR) 100 MG tablet Take 1.5 tablets (150 mg total) by mouth 2 (two) times daily.  90 tablet  2  . MYRBETRIQ 25 MG TB24 tablet TAKE 1 TABLET BY MOUTH EVERY DAY  30 tablet  5  . nitroGLYCERIN (NITROSTAT) 0.3 MG SL tablet Place 0.3 mg under the tongue every 5 (five) minutes as needed. Never had to use       . PARoxetine (PAXIL) 20 MG tablet take 1 tablet daily      . warfarin (COUMADIN) 5 MG tablet Take 1 tablet daily or as directed by coumadin clinic  30 tablet  3   No current facility-administered medications for this visit.    Past Medical History  Diagnosis Date  . PAF (paroxysmal atrial fibrillation)   . Dyslipidemia   . Anxiety   . GIB (gastrointestinal bleeding)   . Obesity   . UC (ulcerative colitis confined to rectum)     Left-sided  . BPH (benign prostatic hyperplasia)   .  Diverticulosis   . GERD (gastroesophageal reflux disease)   . Hemorrhoids   . Diabetes mellitus   . Tremor   . CAD (coronary artery disease)     Anterior MI and DES stenting 2004, Vfib arrest; left main 25% stenosis, LAD 80% stenosis, 99% stenosis, 25% circumflex stenosis, 25% ramus intermedius stenosis, 50-40% right coronary artery stenosis, 90% RV branch stenosis with right to left collaterals; EF 55% with mild naterior hypokinesis (underwent Taxus stenting at the time) . Stress perfusion study January 2012 EF 48% with old apical infarct  . Hyperlipidemia   . Hyperplastic colon polyp   . Myocardial infarction   . Hypertension   . Stroke   . Memory deficits 05/05/2013  . Essential and other specified forms of tremor 05/05/2013  . Hearing deficit     Wears hearing aids    Past Surgical History  Procedure Laterality Date  . Hammer toe surgery      right  . Inguinal hernia repair      right, bilateral  . Coronary angioplasty with stent placement      Taxus stenting  . Cataract extraction w/phaco  10/27/2012    Procedure: CATARACT EXTRACTION PHACO AND  INTRAOCULAR LENS PLACEMENT (IOC);  Surgeon: Gemma Payor, MD;  Location: [facility];  Service: Ophthalmology;  Laterality: Left;  CDE 22.67    ROS: As stated in the HPI and negative for all other systems.  PHYSICAL EXAM BP 118/78  Pulse 88  Ht 5\' 11"  (1.803 m)  Wt 167 lb (75.751 kg)  BMI 23.30 kg/m2 GENERAL:  Well appearing NECK:  No jugular venous distention, waveform within normal limits, carotid upstroke brisk and symmetric, no bruits, no thyromegaly LUNGS:  Clear to auscultation bilaterally HEART:  PMI not displaced or sustained,S1 and S2 within normal limits, no S3, no clicks, no rubs, no murmurs, irregular ABD:  Flat, positive bowel sounds normal in frequency in pitch, no bruits, no rebound, no guarding, no midline pulsatile mass, no hepatomegaly, no splenomegaly EXT:  2 plus pulses throughout, mild left ankle edema, no cyanosis no  clubbing NEURO:  Cranial nerves II through XII grossly intact, motor grossly intact throughout, resting tremor  EKG:  Atrial fibrillation, rate 88, artifact precludes adequate analysis, a normal axis, no acute ST-T wave changes.  06/06/2014  ASSESSMENT AND PLAN  CAD (coronary artery disease) -  The patient has no new sypmtoms.  No further cardiovascular testing is indicated.  We will continue with aggressive risk reduction and meds as listed.  His last stress test was in 2012.  I would like to repeat an echocardiogram as his EF has not been evaluated recently.    DYSLIPIDEMIA -  This is followed closely by Dr. Christell Constant.  I will defer to his management.    FIBRILLATION, ATRIAL -  Mr. ORONDE HALLENBECK has a CHA2DS2 - VASc score of 7 with a risk of stroke of 9.6%.   He tolerates warfarin.  He could not afford Xarelto.    He will continue with meds as listed. Because of the CVA last year and TIA in January he will remain on ASA.

## 2014-06-07 ENCOUNTER — Ambulatory Visit (INDEPENDENT_AMBULATORY_CARE_PROVIDER_SITE_OTHER): Payer: Medicare Other | Admitting: Pharmacist

## 2014-06-07 ENCOUNTER — Encounter: Payer: Self-pay | Admitting: Pharmacist

## 2014-06-07 DIAGNOSIS — I4891 Unspecified atrial fibrillation: Secondary | ICD-10-CM

## 2014-06-07 DIAGNOSIS — I2510 Atherosclerotic heart disease of native coronary artery without angina pectoris: Secondary | ICD-10-CM

## 2014-06-07 LAB — POCT INR: INR: 2.3

## 2014-06-07 NOTE — Patient Instructions (Signed)
Anticoagulation Dose Instructions as of 06/07/2014     Gregory SmilesSun Mon Tue Wed Thu Fri Sat   New Dose 2.5 mg 5 mg 2.5 mg 2.5 mg 2.5 mg 2.5 mg 2.5 mg    Description       Continue warfarin 5mg  1/2 tablet daily except 1 tablet on Mondays.     INR was 2.3 today

## 2014-06-07 NOTE — Progress Notes (Signed)
See anticoagulation note  Filled pill boxes

## 2014-06-21 ENCOUNTER — Telehealth: Payer: Self-pay

## 2014-06-21 ENCOUNTER — Encounter: Payer: Self-pay | Admitting: Pharmacist

## 2014-06-21 ENCOUNTER — Ambulatory Visit (INDEPENDENT_AMBULATORY_CARE_PROVIDER_SITE_OTHER): Payer: Medicare Other | Admitting: Pharmacist

## 2014-06-21 VITALS — BP 122/72 | HR 78 | Ht 71.0 in | Wt 173.0 lb

## 2014-06-21 DIAGNOSIS — I4819 Other persistent atrial fibrillation: Secondary | ICD-10-CM

## 2014-06-21 DIAGNOSIS — Z0000 Encounter for general adult medical examination without abnormal findings: Secondary | ICD-10-CM

## 2014-06-21 DIAGNOSIS — Z23 Encounter for immunization: Secondary | ICD-10-CM

## 2014-06-21 DIAGNOSIS — I4891 Unspecified atrial fibrillation: Secondary | ICD-10-CM

## 2014-06-21 LAB — POCT INR: INR: 2.1

## 2014-06-21 MED ORDER — SULFASALAZINE 500 MG PO TBEC: DELAYED_RELEASE_TABLET | ORAL | Status: DC

## 2014-06-21 NOTE — Telephone Encounter (Signed)
rx sent

## 2014-06-21 NOTE — Telephone Encounter (Signed)
Message copied by Annett FabianJONES, SHERI L on Thu Jun 21, 2014  2:01 PM ------      Message from: Henrene PastorECKARD, TAMMY      Created: Thu Jun 21, 2014  1:48 PM      Regarding: RE: Medication change request       If you don't mind that would be great.  I will let Mr. Cromartie know.             Thank you!      Tammy      ----- Message -----         From: Rossie MuskratSheri Lynn Jones, RN         Sent: 06/21/2014   1:05 PM           To: Henrene Pastorammy Eckard, PHARMD      Subject: FW: Medication change request                            Let me know if we need to send in the rx      ----- Message -----         From: Meryl DareMalcolm T Stark, MD         Sent: 06/21/2014  11:20 AM           To: Rossie MuskratSheri Lynn Jones, RN      Subject: FW: Medication change request                            He can try Azulfidine 500 mg bid for 1 week then increase to 1 g bid for long term usage.                         ----- Message -----         From: Henrene Pastorammy Eckard, PHARMD         Sent: 06/21/2014   8:13 AM           To: Meryl DareMalcolm T Stark, MD      Subject: Medication change request                                Dr Russella DarStark,       [name] has reach the Medicare coverage gap.  His Lialda cost now is about $500.  It there a cheaper alternative you would recommend?            Henrene Pastorammy Eckard, PharmD, CPP                         ------

## 2014-06-21 NOTE — Progress Notes (Signed)
Subjective:    [name] is a 80 y.o. male who presents for Medicare Initial preventive examination.   Preventive Screening-Counseling & Management  Tobacco History  Smoking status  . Former Smoker  . Quit date: 11/23/1958  Smokeless tobacco  . Former Neurosurgeon  . Types: Chew    Comment: Quit years ago    Current Problems (verified) Patient Active Problem List   Diagnosis Date Noted  . Afib 04/02/2014  . High risk medication use 06/22/2013  . Hypokalemia 06/22/2013  . Memory deficits 05/05/2013  . Essential and other specified forms of tremor 05/05/2013  . Cerebrovascular disease, unspecified 01/16/2013  . CAD (coronary artery disease)   . BPH (benign prostatic hypertrophy) 02/11/2011  . Premature atrial complexes 02/11/2011  . Panic attack 02/11/2011  . Diabetes mellitus 02/11/2011  . Hyperlipidemia 02/12/2010  . Atrial fibrillation, persistent 10/30/2009  . GERD 03/12/2009  . ULCERATIVE COLITIS-LEFT SIDE 03/12/2009    Medications Prior to Visit Current Outpatient Prescriptions on File Prior to Visit  Medication Sig Dispense Refill  . atorvastatin (LIPITOR) 40 MG tablet TAKE 1 TABLET (40 MG TOTAL) BY MOUTH AT BEDTIME.  30 tablet  3  . Cholecalciferol (VITAMIN D) 1000 UNITS capsule Take 1 capsule (1,000 Units total) by mouth daily.      . [facility] B-12 1000 MCG TBCR TAKE 1 TABLET BY MOUTH EVERY DAY  60 tablet  11  . digoxin (LANOXIN) 0.125 MG tablet TAKE 1 TABLET EVERY DAY  30 tablet  2  . mesalamine (LIALDA) 1.2 G EC tablet Take 2.4 g by mouth daily with breakfast. Take 2 tablets by mouth daily      . metoprolol (LOPRESSOR) 100 MG tablet Take 1.5 tablets (150 mg total) by mouth 2 (two) times daily.  90 tablet  2  . MYRBETRIQ 25 MG TB24 tablet TAKE 1 TABLET BY MOUTH EVERY DAY  30 tablet  5  . nitroGLYCERIN (NITROSTAT) 0.3 MG SL tablet Place 0.3 mg under the tongue every 5 (five) minutes as needed. Never had to use       . PARoxetine (PAXIL) 20 MG tablet take 1 tablet daily       . warfarin (COUMADIN) 5 MG tablet Take 1 tablet daily or as directed by coumadin clinic  30 tablet  3   No current facility-administered medications on file prior to visit.    Current Medications (verified) Current Outpatient Prescriptions  Medication Sig Dispense Refill  . atorvastatin (LIPITOR) 40 MG tablet TAKE 1 TABLET (40 MG TOTAL) BY MOUTH AT BEDTIME.  30 tablet  3  . Cholecalciferol (VITAMIN D) 1000 UNITS capsule Take 1 capsule (1,000 Units total) by mouth daily.      . [facility] B-12 1000 MCG TBCR TAKE 1 TABLET BY MOUTH EVERY DAY  60 tablet  11  . digoxin (LANOXIN) 0.125 MG tablet TAKE 1 TABLET EVERY DAY  30 tablet  2  . mesalamine (LIALDA) 1.2 G EC tablet Take 2.4 g by mouth daily with breakfast. Take 2 tablets by mouth daily      . metoprolol (LOPRESSOR) 100 MG tablet Take 1.5 tablets (150 mg total) by mouth 2 (two) times daily.  90 tablet  2  . MYRBETRIQ 25 MG TB24 tablet TAKE 1 TABLET BY MOUTH EVERY DAY  30 tablet  5  . nitroGLYCERIN (NITROSTAT) 0.3 MG SL tablet Place 0.3 mg under the tongue every 5 (five) minutes as needed. Never had to use       . PARoxetine (PAXIL)  20 MG tablet take 1 tablet daily      . warfarin (COUMADIN) 5 MG tablet Take 1 tablet daily or as directed by coumadin clinic  30 tablet  3   No current facility-administered medications for this visit.     Allergies (verified) Asa-apap-salicyl-caff; Codeine; Nsaids; and Sulfonamide derivatives   PAST HISTORY  Family History Family History  Problem Relation Age of Onset  . Colon cancer Neg Hx   . Alzheimer's disease Brother   . Diabetes Brother     x 4  . Diabetes Mother     ?  . Diabetes Father   . Heart disease Father   . Heart disease Paternal Grandfather   . Heart disease Brother     x 3  . COPD Sister   . Diabetes Sister     Social History History  Substance Use Topics  . Smoking status: Former Smoker    Quit date: 11/23/1958  . Smokeless tobacco: Former NeurosurgeonUser    Types: Chew     Comment:  Quit years ago  . Alcohol Use: No    Are there smokers in your home (other than you)?  No  Risk Factors Current exercise habits: Gym/ health club routine includes light weights, stationary bike and exercises with [place] - physical thearpist.  Dietary issues discussed: limiting high CHO intake   Cardiac risk factors: advanced age (older than 4955 for men, 3565 for women), diabetes mellitus, dyslipidemia, family history of premature cardiovascular disease, hypertension and male gender.  Depression Screen (Note: if answer to either of the following is "Yes", a more complete depression screening is indicated)   Q1: Over the past two weeks, have you felt down, depressed or hopeless? No  Q2: Over the past two weeks, have you felt little interest or pleasure in doing things? No  Have you lost interest or pleasure in daily life? No  Do you often feel hopeless? No  Do you cry easily over simple problems? No  Activities of Daily Living In your present state of health, do you have any difficulty performing the following activities?:  Driving? No Managing money?  No Feeding yourself? No Getting from bed to chair? No Climbing a flight of stairs? No Preparing food and eating?: No Bathing or showering? No Getting dressed: No Getting to the toilet? No Using the toilet:No Moving around from place to place: No In the past year have you fallen or had a near fall?:Yes   Are you sexually active?  No  Do you have more than one partner?  No  Hearing Difficulties: Yes - wears hearing aids Do you often ask people to speak up or repeat themselves? Yes Do you experience ringing or noises in your ears? No Do you have difficulty understanding soft or whispered voices? Yes   Do you feel that you have a problem with memory? No  Do you often misplace items? No  Do you feel safe at home?  Yes  Cognitive Testing  Alert? Yes  Normal Appearance?Yes  Oriented to person? Yes  Place? Yes   Time? No  Recall of  three objects?  No - only remembered 2 of 3 objects  Can perform simple calculations? Yes  Displays appropriate judgment?Yes  Can read the correct time from a watch face?Yes   Advanced Directives have been discussed with the patient? Yes   List the Names of Other Physician/Practitioners you currently use: 1.  Annabell HowellsWrenn - urologist 2.  Hocherin - Cardiologist 3.  Smitty CordsMartin, Dustin -  opthamologist 4.  Russella Dar - GI 5.  Willis - Neurologist  Indicate any recent Medical Services you may have received from other than [facility] providers in the past year (date may be approximate).  Immunization History  Administered Date(s) Administered  . Influenza Whole 09/23/2010  . Pneumococcal Conjugate-13 11/23/1998 - Had Demetrios Loll research this because Prevnar 13 was not available in 2000 to adults.  It appears this was an entry error.  Patient's last pneumonia vaccine was Pneumo-23 and was in 2010  . Td 12/25/2007    Screening Tests Health Maintenance  Topic Date Due  . Ophthalmology Exam  10/23/2013  . Urine Microalbumin  11/11/2013  . Colonoscopy  11/26/2014 (Originally 07/13/2013)  . Influenza Vaccine  06/23/2014  . Hemoglobin A1c  09/08/2014  . Foot Exam  10/10/2014  . Tetanus/tdap  12/24/2017  . Pneumococcal Polysaccharide Vaccine Age 69 And Over  Completed  . Zostavax  Completed    All answers were reviewed with the patient and necessary referrals were made:  Henrene Pastor, [facility]   06/21/2014   History reviewed: allergies, current medications, past family history, past medical history, past social history, past surgical history and problem list   Objective:   Blood pressure 122/72, pulse 78, height 5\' 11"  (1.803 m), weight 173 lb (78.472 kg). Body mass index is 24.14 kg/(m^2).  INR was 2.1 today  Assessment:     Initial Medicare Wellness Visit Therapeutic Anticoagulation  Plan:     During the course of the visit the patient was educated and counseled about appropriate screening  and preventive services including:    Pneumococcal vaccine - Prevnar 13 given today  Influenza vaccine  Hepatitis B vaccine  Td vaccine  Screening electrocardiogram  Prostate cancer screening  Colorectal cancer screening - per Dr Ardell Isaacs last note he is postponing screening colonoscopies due to patients CVA / TIA risk if he holds anticoagulation therapy  Diabetes screening  Glaucoma screening - Patient reminded to make appt with Dr Daphine Deutscher for yearly eye exam  Nutrition counseling - patient declined  Advanced directives: has an advanced directive - a copy HAS NOT been provided.  Follow up appt made today with PCP for chronic conditions and memory evlauation, labs - micoralbumin   Anticoagulation Dose Instructions as of 06/21/2014     Glynis Smiles Tue Wed Thu Fri Sat   New Dose 2.5 mg 5 mg 2.5 mg 2.5 mg 2.5 mg 2.5 mg 2.5 mg    Description       Continue warfarin 5mg  1/2 tablet daily except 1 tablet on Mondays.      Patient Instructions (the written plan) was given to the patient.  Medicare Attestation I have personally reviewed: The patient's medical and social history Their use of alcohol, tobacco or illicit drugs Their current medications and supplements The patient's functional ability including ADLs,fall risks, home safety risks, cognitive, and hearing and visual impairment Diet and physical activities Evidence for depression or mood disorders  The patient's weight, height, BMI, and BP/HR have been recorded in the chart.  I have made referrals, counseling, and provided education to the patient based on review of the above and I have provided the patient with a written personalized care plan for preventive services.     Henrene Pastor, [facility]   06/21/2014

## 2014-06-21 NOTE — Patient Instructions (Addendum)
Health Maintenance Summary    COLONOSCOPY Per Dr Fuller Plan  Postponed      OPHTHALMOLOGY EXAM Overdue 10/23/2013  Need to call Dr Earlie Server office to make appointment    URINE MICROALBUMIN Overdue 11/11/2013      INFLUENZA VACCINE Next Due 06/23/2014  Last 09/06/2013    HEMOGLOBIN A1C Next Due 09/08/2014  last 03/09/2014   Pneumonia Vaccine      Shingles / Zostavax Vaccine Completed      FOOT EXAM Next Due 10/10/2014      TETANUS/TDAP Next Due 12/24/2017  Last was 12/25/2007       Anticoagulation Dose Instructions as of 06/21/2014     Dorene Grebe Tue Wed Thu Fri Sat   New Dose 2.5 mg 5 mg 2.5 mg 2.5 mg 2.5 mg 2.5 mg 2.5 mg    Description       Continue warfarin 45m 1/2 tablet daily except 1 tablet on Mondays.      INR was 2.1 today    Preventive Care for Adults A healthy lifestyle and preventive care can promote health and wellness. Preventive health guidelines for men include the following key practices:  A routine yearly physical is a good way to check with your health care provider about your health and preventative screening. It is a chance to share any concerns and updates on your health and to receive a thorough exam.  Visit your dentist for a routine exam and preventative care every 6 months. Brush your teeth twice a day and floss once a day. Good oral hygiene prevents tooth decay and gum disease.  The frequency of eye exams is based on your age, health, family medical history, use of contact lenses, and other factors. Follow your health care provider's recommendations for frequency of eye exams.  Eat a healthy diet. Foods such as vegetables, fruits, whole grains, low-fat dairy products, and lean protein foods contain the nutrients you need without too many calories. Decrease your intake of foods high in solid fats, added sugars, and salt. Eat the right amount of calories for you.Get information about a proper diet from your health care provider, if necessary.  Regular physical  exercise is one of the most important things you can do for your health. Most adults should get at least 150 minutes of moderate-intensity exercise (any activity that increases your heart rate and causes you to sweat) each week. In addition, most adults need muscle-strengthening exercises on 2 or more days a week.  Maintain a healthy weight. The body mass index (BMI) is a screening tool to identify possible weight problems. It provides an estimate of body fat based on height and weight. Your health care provider can find your BMI and can help you achieve or maintain a healthy weight.For adults 20 years and older:  A BMI below 18.5 is considered underweight.  A BMI of 18.5 to 24.9 is normal.  A BMI of 25 to 29.9 is considered overweight.  A BMI of 30 and above is considered obese.  Maintain normal blood lipids and cholesterol levels by exercising and minimizing your intake of saturated fat. Eat a balanced diet with plenty of fruit and vegetables. Blood tests for lipids and cholesterol should begin at age 7753and be repeated every 5 years. If your lipid or cholesterol levels are high, you are over 50, or you are at high risk for heart disease, you may need your cholesterol levels checked more frequently.Ongoing high lipid and cholesterol levels should be treated with medicines if  diet and exercise are not working.  If you smoke, find out from your health care provider how to quit. If you do not use tobacco, do not start.  Lung cancer screening is recommended for adults aged 26-80 years who are at high risk for developing lung cancer because of a history of smoking. A yearly low-dose CT scan of the lungs is recommended for people who have at least a 30-pack-year history of smoking and are a current smoker or have quit within the past 15 years. A pack year of smoking is smoking an average of 1 pack of cigarettes a day for 1 year (for example: 1 pack a day for 30 years or 2 packs a day for 15 years).  Yearly screening should continue until the smoker has stopped smoking for at least 15 years. Yearly screening should be stopped for people who develop a health problem that would prevent them from having lung cancer treatment.  If you choose to drink alcohol, do not have more than 2 drinks per day. One drink is considered to be 12 ounces (355 mL) of beer, 5 ounces (148 mL) of wine, or 1.5 ounces (44 mL) of liquor.  Avoid use of street drugs. Do not share needles with anyone. Ask for help if you need support or instructions about stopping the use of drugs.  High blood pressure causes heart disease and increases the risk of stroke. Your blood pressure should be checked at least every 1-2 years. Ongoing high blood pressure should be treated with medicines, if weight loss and exercise are not effective.  If you are 97-77 years old, ask your health care provider if you should take aspirin to prevent heart disease.  Diabetes screening involves taking a blood sample to check your fasting blood sugar level. This should be done once every 3 years, after age 57, if you are within normal weight and without risk factors for diabetes. Testing should be considered at a younger age or be carried out more frequently if you are overweight and have at least 1 risk factor for diabetes.  Colorectal cancer can be detected and often prevented. Most routine colorectal cancer screening begins at the age of 21 and continues through age 76. However, your health care provider may recommend screening at an earlier age if you have risk factors for colon cancer. On a yearly basis, your health care provider may provide home test kits to check for hidden blood in the stool. Use of a small camera at the end of a tube to directly examine the colon (sigmoidoscopy or colonoscopy) can detect the earliest forms of colorectal cancer. Talk to your health care provider about this at age 10, when routine screening begins. Direct exam of the  colon should be repeated every 5-10 years through age 7, unless early forms of precancerous polyps or small growths are found.  People who are at an increased risk for hepatitis B should be screened for this virus. You are considered at high risk for hepatitis B if:  You were born in a country where hepatitis B occurs often. Talk with your health care provider about which countries are considered high risk.  Your parents were born in a high-risk country and you have not received a shot to protect against hepatitis B (hepatitis B vaccine).  You have HIV or AIDS.  You use needles to inject street drugs.  You live with, or have sex with, someone who has hepatitis B.  You are a man who  has sex with other men (MSM).  You get hemodialysis treatment.  You take certain medicines for conditions such as cancer, organ transplantation, and autoimmune conditions.  Hepatitis C blood testing is recommended for all people born from [birth year] through 1963 and any individual with known risks for hepatitis C.  Practice safe sex. Use condoms and avoid high-risk sexual practices to reduce the spread of sexually transmitted infections (STIs). STIs include gonorrhea, chlamydia, syphilis, trichomonas, herpes, HPV, and human immunodeficiency virus (HIV). Herpes, HIV, and HPV are viral illnesses that have no cure. They can result in disability, cancer, and death.  If you are at risk of being infected with HIV, it is recommended that you take a prescription medicine daily to prevent HIV infection. This is called preexposure prophylaxis (PrEP). You are considered at risk if:  You are a man who has sex with other men (MSM) and have other risk factors.  You are a heterosexual man, are sexually active, and are at increased risk for HIV infection.  You take drugs by injection.  You are sexually active with a partner who has HIV.  Talk with your health care provider about whether you are at high risk of being infected  with HIV. If you choose to begin PrEP, you should first be tested for HIV. You should then be tested every 3 months for as long as you are taking PrEP.  A one-time screening for abdominal aortic aneurysm (AAA) and surgical repair of large AAAs by ultrasound are recommended for men ages 63 to 49 years who are current or former smokers.  Healthy men should no longer receive prostate-specific antigen (PSA) blood tests as part of routine cancer screening. Talk with your health care provider about prostate cancer screening.  Testicular cancer screening is not recommended for adult males who have no symptoms. Screening includes self-exam, a health care provider exam, and other screening tests. Consult with your health care provider about any symptoms you have or any concerns you have about testicular cancer.  Use sunscreen. Apply sunscreen liberally and repeatedly throughout the day. You should seek shade when your shadow is shorter than you. Protect yourself by wearing long sleeves, pants, a wide-brimmed hat, and sunglasses year round, whenever you are outdoors.  Once a month, do a whole-body skin exam, using a mirror to look at the skin on your back. Tell your health care provider about new moles, moles that have irregular borders, moles that are larger than a pencil eraser, or moles that have changed in shape or color.  Stay current with required vaccines (immunizations).  Influenza vaccine. All adults should be immunized every year.  Tetanus, diphtheria, and acellular pertussis (Td, Tdap) vaccine. An adult who has not previously received Tdap or who does not know his vaccine status should receive 1 dose of Tdap. This initial dose should be followed by tetanus and diphtheria toxoids (Td) booster doses every 10 years. Adults with an unknown or incomplete history of completing a 3-dose immunization series with Td-containing vaccines should begin or complete a primary immunization series including a Tdap  dose. Adults should receive a Td booster every 10 years.  Varicella vaccine. An adult without evidence of immunity to varicella should receive 2 doses or a second dose if he has previously received 1 dose.  Human papillomavirus (HPV) vaccine. Males aged 2-21 years who have not received the vaccine previously should receive the 3-dose series. Males aged 22-26 years may be immunized. Immunization is recommended through the age of 32 years  for any male who has sex with males and did not get any or all doses earlier. Immunization is recommended for any person with an immunocompromised condition through the age of 16 years if he did not get any or all doses earlier. During the 3-dose series, the second dose should be obtained 4-8 weeks after the first dose. The third dose should be obtained 24 weeks after the first dose and 16 weeks after the second dose.  Zoster vaccine. One dose is recommended for adults aged 17 years or older unless certain conditions are present.  Measles, mumps, and rubella (MMR) vaccine. Adults born before [birth year] generally are considered immune to measles and mumps. Adults born in [birth year] or later should have 1 or more doses of MMR vaccine unless there is a contraindication to the vaccine or there is laboratory evidence of immunity to each of the three diseases. A routine second dose of MMR vaccine should be obtained at least 28 days after the first dose for students attending postsecondary schools, health care workers, or international travelers. People who received inactivated measles vaccine or an unknown type of measles vaccine during 1963-1967 should receive 2 doses of MMR vaccine. People who received inactivated mumps vaccine or an unknown type of mumps vaccine before 1979 and are at high risk for mumps infection should consider immunization with 2 doses of MMR vaccine. Unvaccinated health care workers born before [birth year] who lack laboratory evidence of measles, mumps, or rubella immunity or  laboratory confirmation of disease should consider measles and mumps immunization with 2 doses of MMR vaccine or rubella immunization with 1 dose of MMR vaccine.  Pneumococcal 13-valent conjugate (PCV13) vaccine. When indicated, a person who is uncertain of his immunization history and has no record of immunization should receive the PCV13 vaccine. An adult aged 38 years or older who has certain medical conditions and has not been previously immunized should receive 1 dose of PCV13 vaccine. This PCV13 should be followed with a dose of pneumococcal polysaccharide (PPSV23) vaccine. The PPSV23 vaccine dose should be obtained at least 8 weeks after the dose of PCV13 vaccine. An adult aged 39 years or older who has certain medical conditions and previously received 1 or more doses of PPSV23 vaccine should receive 1 dose of PCV13. The PCV13 vaccine dose should be obtained 1 or more years after the last PPSV23 vaccine dose.  Pneumococcal polysaccharide (PPSV23) vaccine. When PCV13 is also indicated, PCV13 should be obtained first. All adults aged 10 years and older should be immunized. An adult younger than age 22 years who has certain medical conditions should be immunized. Any person who resides in a nursing home or long-term care facility should be immunized. An adult smoker should be immunized. People with an immunocompromised condition and certain other conditions should receive both PCV13 and PPSV23 vaccines. People with human immunodeficiency virus (HIV) infection should be immunized as soon as possible after diagnosis. Immunization during chemotherapy or radiation therapy should be avoided. Routine use of PPSV23 vaccine is not recommended for American Indians, [place] Natives, or people younger than 65 years unless there are medical conditions that require PPSV23 vaccine. When indicated, people who have unknown immunization and have no record of immunization should receive PPSV23 vaccine. One-time revaccination  5 years after the first dose of PPSV23 is recommended for people aged 19-64 years who have chronic kidney failure, nephrotic syndrome, asplenia, or immunocompromised conditions. People who received 1-2 doses of PPSV23 before age 22 years should receive another dose of PPSV23  vaccine at age 80 years or later if at least 5 years have passed since the previous dose. Doses of PPSV23 are not needed for people immunized with PPSV23 at or after age 38 years.  Meningococcal vaccine. Adults with asplenia or persistent complement component deficiencies should receive 2 doses of quadrivalent meningococcal conjugate (MenACWY-D) vaccine. The doses should be obtained at least 2 months apart. Microbiologists working with certain meningococcal bacteria, [employer] recruits, people at risk during an outbreak, and people who travel to or live in countries with a high rate of meningitis should be immunized. A first-year college student up through age 28 years who is living in a residence hall should receive a dose if he did not receive a dose on or after his 16th birthday. Adults who have certain high-risk conditions should receive one or more doses of vaccine.  Hepatitis A vaccine. Adults who wish to be protected from this disease, have certain high-risk conditions, work with hepatitis A-infected animals, work in hepatitis A research labs, or travel to or work in countries with a high rate of hepatitis A should be immunized. Adults who were previously unvaccinated and who anticipate close contact with an international adoptee during the first 60 days after arrival in the [place] from a country with a high rate of hepatitis A should be immunized.  Hepatitis B vaccine. Adults should be immunized if they wish to be protected from this disease, have certain high-risk conditions, may be exposed to blood or other infectious body fluids, are household contacts or sex partners of hepatitis B positive people, are clients or workers  in certain care facilities, or travel to or work in countries with a high rate of hepatitis B.

## 2014-07-05 ENCOUNTER — Encounter: Payer: Self-pay | Admitting: Pharmacist

## 2014-07-05 ENCOUNTER — Ambulatory Visit (INDEPENDENT_AMBULATORY_CARE_PROVIDER_SITE_OTHER): Payer: Medicare Other | Admitting: Pharmacist

## 2014-07-05 DIAGNOSIS — I4819 Other persistent atrial fibrillation: Secondary | ICD-10-CM

## 2014-07-05 DIAGNOSIS — I2510 Atherosclerotic heart disease of native coronary artery without angina pectoris: Secondary | ICD-10-CM

## 2014-07-05 DIAGNOSIS — I4891 Unspecified atrial fibrillation: Secondary | ICD-10-CM

## 2014-07-05 LAB — POCT INR: INR: 1.9

## 2014-07-05 NOTE — Patient Instructions (Signed)
Anticoagulation Dose Instructions as of 07/05/2014     Gregory SmilesSun Mon Tue Wed Thu Fri Sat   New Dose 2.5 mg 5 mg 2.5 mg 2.5 mg 2.5 mg 2.5 mg 2.5 mg    Description       Continue warfarin 5mg  1/2 tablet daily except 1 tablet on Mondays.      INR was 1.9 today

## 2014-07-05 NOTE — Progress Notes (Signed)
Filled pill boxes and checked protime - see anticoagulation note

## 2014-07-19 ENCOUNTER — Encounter: Payer: Self-pay | Admitting: Pharmacist

## 2014-07-19 ENCOUNTER — Ambulatory Visit (INDEPENDENT_AMBULATORY_CARE_PROVIDER_SITE_OTHER): Payer: Medicare Other | Admitting: Pharmacist

## 2014-07-19 VITALS — BP 124/72 | HR 74 | Ht 71.0 in | Wt 174.0 lb

## 2014-07-19 DIAGNOSIS — I4819 Other persistent atrial fibrillation: Secondary | ICD-10-CM

## 2014-07-19 DIAGNOSIS — I2510 Atherosclerotic heart disease of native coronary artery without angina pectoris: Secondary | ICD-10-CM

## 2014-07-19 DIAGNOSIS — I4891 Unspecified atrial fibrillation: Secondary | ICD-10-CM

## 2014-07-19 LAB — POCT INR: INR: 1.4

## 2014-07-19 MED ORDER — METOPROLOL TARTRATE 100 MG PO TABS: 150.0000 mg | ORAL_TABLET | Freq: Two times a day (BID) | ORAL | Status: DC

## 2014-07-19 MED ORDER — ATORVASTATIN CALCIUM 40 MG PO TABS: 40.0000 mg | ORAL_TABLET | Freq: Every day | ORAL | Status: DC

## 2014-07-19 NOTE — Progress Notes (Signed)
CC:  Fill medication boxes and recheck INR  HPI:   We have been filling pill boxes for patient for about 4 months and for his wife for about 18 months.  Patient was in office earlier this week and somehow pill boxes were empty though should have had enough doses until today.  He thinks that his wife may have spilled medication out of box.  Patient has appt with Dr Christell Constant regarding his memory for 07/23/14.  INR today was 1.4   Assessment:  demenita              subtherapeutic anticoagulation - although patient states he has not missed any doses I am not sure he has not missed  Plan: Expressed concern to patient about recent problems related to memory and medication - will discuss further with his PCP Filled pill boxes for 2 weeks  Recheck INR at appt 07/23/14 Anticoagulation Dose Instructions as of 07/19/2014     Glynis Smiles Tue Wed Thu Fri Sat   New Dose 2.5 mg 5 mg 2.5 mg 2.5 mg 2.5 mg 2.5 mg 2.5 mg    Description       Continue warfarin  1/2 tablet daily except 1 tablet on Mondays.

## 2014-07-23 ENCOUNTER — Encounter: Payer: Self-pay | Admitting: Family Medicine

## 2014-07-23 ENCOUNTER — Ambulatory Visit (INDEPENDENT_AMBULATORY_CARE_PROVIDER_SITE_OTHER): Payer: Medicare Other | Admitting: Family Medicine

## 2014-07-23 VITALS — BP 113/67 | HR 64 | Temp 97.9°F | Ht 71.0 in | Wt 171.0 lb

## 2014-07-23 DIAGNOSIS — K219 Gastro-esophageal reflux disease without esophagitis: Secondary | ICD-10-CM

## 2014-07-23 DIAGNOSIS — E785 Hyperlipidemia, unspecified: Secondary | ICD-10-CM

## 2014-07-23 DIAGNOSIS — N400 Enlarged prostate without lower urinary tract symptoms: Secondary | ICD-10-CM

## 2014-07-23 DIAGNOSIS — I4819 Other persistent atrial fibrillation: Secondary | ICD-10-CM

## 2014-07-23 DIAGNOSIS — I2510 Atherosclerotic heart disease of native coronary artery without angina pectoris: Secondary | ICD-10-CM

## 2014-07-23 DIAGNOSIS — Z79899 Other long term (current) drug therapy: Secondary | ICD-10-CM

## 2014-07-23 DIAGNOSIS — R413 Other amnesia: Secondary | ICD-10-CM

## 2014-07-23 DIAGNOSIS — E119 Type 2 diabetes mellitus without complications: Secondary | ICD-10-CM

## 2014-07-23 DIAGNOSIS — I4891 Unspecified atrial fibrillation: Secondary | ICD-10-CM

## 2014-07-23 DIAGNOSIS — E559 Vitamin D deficiency, unspecified: Secondary | ICD-10-CM

## 2014-07-23 LAB — POCT CBC
Granulocyte percent: 75.5 %G (ref 37–80)
HCT, POC: 42.6 % — AB (ref 43.5–53.7)
Hemoglobin: 13.6 g/dL — AB (ref 14.1–18.1)
LYMPH, POC: 1.3 (ref 0.6–3.4)
MCH: 29.9 pg (ref 27–31.2)
MCHC: 31.8 g/dL (ref 31.8–35.4)
MCV: 94 fL (ref 80–97)
MPV: 7.7 fL (ref 0–99.8)
PLATELET COUNT, POC: 168 10*3/uL (ref 142–424)
POC Granulocyte: 4.4 (ref 2–6.9)
POC LYMPH PERCENT: 22.1 %L (ref 10–50)
RBC: 4.5 M/uL — AB (ref 4.69–6.13)
RDW, POC: 13.3 %
WBC: 5.8 10*3/uL (ref 4.6–10.2)

## 2014-07-23 LAB — POCT GLYCOSYLATED HEMOGLOBIN (HGB A1C): Hemoglobin A1C: 5.8

## 2014-07-23 LAB — POCT UA - MICROALBUMIN: Microalbumin Ur, POC: NEGATIVE mg/L

## 2014-07-23 NOTE — Patient Instructions (Addendum)
Medicare Annual Wellness Visit  [facility] and the medical providers at [facility] strive to bring you the best medical care.  In doing so we not only want to address your current medical conditions and concerns but also to detect new conditions early and prevent illness, disease and health-related problems.    Medicare offers a yearly Wellness Visit which allows our clinical staff to assess your need for preventative services including immunizations, lifestyle education, counseling to decrease risk of preventable diseases and screening for fall risk and other medical concerns.    This visit is provided free of charge (no copay) for all Medicare recipients. The clinical pharmacists at [facility] have begun to conduct these Wellness Visits which will also include a thorough review of all your medications.    As you primary medical provider recommend that you make an appointment for your Annual Wellness Visit if you have not done so already this year.  You may set up this appointment before you leave today or you may call back ([phone]) and schedule an appointment.  Please make sure when you call that you mention that you are scheduling your Annual Wellness Visit with the clinical pharmacist so that the appointment may be made for the proper length of time.     Continue current medications. Continue good therapeutic lifestyle changes which include good diet and exercise. Fall precautions discussed with patient. If an FOBT was given today- please return it to our front desk. If you are over 60 years old - you may need Prevnar 13 or the adult Pneumonia vaccine.  Flu Shots will be available at our office starting mid- September. Please call and schedule a FLU CLINIC APPOINTMENT.   Please discuss with your wife's nephew about better total care than being at home by yourselves

## 2014-07-23 NOTE — Progress Notes (Signed)
Subjective:    Patient ID: [name], male    DOB: 09-06-1930, 80 y.o.   MRN: [id number]  HPI Pt here for follow up and management of chronic medical problems. The patient's wife comes with him to the visit today. All this has been helping the patient and his wife with fixing medications to be taken on a weekly basis. This patient has seen the neurologist in the past couple of months and he recommended to the patient that he should not be driving anymore. The patient continues to have a tremor and some confusion. He does not remember coming in this past week to get the medication place in his containers.         Patient Active Problem List   Diagnosis Date Noted  . High risk medication use 06/22/2013  . Hypokalemia 06/22/2013  . Memory deficits 05/05/2013  . Essential and other specified forms of tremor 05/05/2013  . Cerebrovascular disease, unspecified 01/16/2013  . CAD (coronary artery disease)   . BPH (benign prostatic hypertrophy) 02/11/2011  . Premature atrial complexes 02/11/2011  . Panic attack 02/11/2011  . Diabetes mellitus 02/11/2011  . Hyperlipidemia 02/12/2010  . Atrial fibrillation, persistent 10/30/2009  . GERD 03/12/2009  . ULCERATIVE COLITIS-LEFT SIDE 03/12/2009   Outpatient Encounter Prescriptions as of 07/23/2014  Medication Sig  . atorvastatin (LIPITOR) 40 MG tablet Take 1 tablet (40 mg total) by mouth at bedtime.  . Cholecalciferol (VITAMIN D) 1000 UNITS capsule Take 1 capsule (1,000 Units total) by mouth daily.  . CVS B-12 1000 MCG TBCR TAKE 1 TABLET BY MOUTH EVERY DAY  . digoxin (LANOXIN) 0.125 MG tablet TAKE 1 TABLET EVERY DAY  . LIALDA 1.2 G EC tablet   . metoprolol (LOPRESSOR) 100 MG tablet Take 1.5 tablets (150 mg total) by mouth 2 (two) times daily.  Marland Kitchen MYRBETRIQ 25 MG TB24 tablet TAKE 1 TABLET BY MOUTH EVERY DAY  . nitroGLYCERIN (NITROSTAT) 0.3 MG SL tablet Place 0.3 mg under the tongue every 5 (five) minutes as needed. Never had to use   .  PARoxetine (PAXIL) 20 MG tablet take 1 tablet daily  . sulfaSALAzine (AZULFIDINE) 500 MG EC tablet Take 500 mg twice a day by mouth for 1 week, then 1000 mg BID  . warfarin (COUMADIN) 5 MG tablet Take 1 tablet daily or as directed by coumadin clinic    Review of Systems  Constitutional: Negative.   HENT: Negative.   Eyes: Negative.   Respiratory: Negative.   Cardiovascular: Negative.   Gastrointestinal: Negative.   Endocrine: Negative.   Genitourinary: Negative.   Musculoskeletal: Negative.   Skin: Negative.   Allergic/Immunologic: Negative.   Neurological: Positive for tremors.  Hematological: Negative.   Psychiatric/Behavioral: Positive for confusion.       Objective:   Physical Exam  Nursing note and vitals reviewed. Constitutional: He is oriented to person, place, and time. No distress.  Elderly appearing with a bilateral upper extremity tremor and poor hearing. The patient appears to have lost weight  HENT:  Head: Normocephalic and atraumatic.  Right Ear: External ear normal.  Left Ear: External ear normal.  Nose: Nose normal.  Mouth/Throat: Oropharynx is clear and moist. No oropharyngeal exudate.  Bilateral hearing aid  Eyes: Conjunctivae and EOM are normal. Pupils are equal, round, and reactive to light. Right eye exhibits no discharge. Left eye exhibits no discharge. No scleral icterus.  Neck: Normal range of motion. Neck supple. No thyromegaly present.  Cardiovascular: Normal rate, normal heart sounds and  intact distal pulses.  Exam reveals no gallop and no friction rub.   No murmur heard. The heart was irregular irregular at 96 per minute  Pulmonary/Chest: Effort normal and breath sounds normal. No respiratory distress. He has no wheezes. He has no rales. He exhibits no tenderness.  Abdominal: Soft. Bowel sounds are normal. He exhibits no mass. There is no tenderness. There is no rebound and no guarding.  Musculoskeletal: Normal range of motion. He exhibits no  edema and no tenderness.  Lymphadenopathy:    He has no cervical adenopathy.  Neurological: He is alert and oriented to person, place, and time. He has normal reflexes. No cranial nerve deficit.   The MMSE score of 20/30.  Skin: Skin is warm and dry. No rash noted. No erythema. No pallor.  Psychiatric: He has a normal mood and affect. His behavior is normal. Judgment and thought content normal.   BP 113/67  Pulse 64  Temp(Src) 97.9 F (36.6 C) (Oral)  Ht 5' 11" (1.803 m)  Wt 171 lb (77.565 kg)  BMI 23.86 kg/m2  About 45 minutes of time was spent discussing the need for he and his wife to be an care situation where someone was helping them more.      Assessment & Plan:  1. Atrial fibrillation, persistent - POCT CBC  2. BPH (benign prostatic hypertrophy) - POCT CBC  3. Coronary artery disease involving native coronary artery of native heart without angina pectoris - POCT CBC - BMP8+EGFR - Hepatic function panel  4. Type 2 diabetes mellitus without complication - POCT CBC - POCT glycosylated hemoglobin (Hb A1C) - POCT UA - Microalbumin  5. Gastroesophageal reflux disease, esophagitis presence not specified - POCT CBC  6. High risk medication use - POCT CBC  7. Hyperlipidemia - POCT CBC - Lipid panel  8. Memory deficits - POCT CBC -20 out of 30 on MMSE  9. Vitamin D deficiency - Vit D  25 hydroxy (rtn osteoporosis monitoring)  Meds ordered this encounter  Medications  . LIALDA 1.2 G EC tablet    Sig:    Patient Instructions                       Medicare Annual Wellness Visit  [place] and the medical providers at [facility] strive to bring you the best medical care.  In doing so we not only want to address your current medical conditions and concerns but also to detect new conditions early and prevent illness, disease and health-related problems.    Medicare offers a yearly Wellness Visit which allows our clinical staff to assess  your need for preventative services including immunizations, lifestyle education, counseling to decrease risk of preventable diseases and screening for fall risk and other medical concerns.    This visit is provided free of charge (no copay) for all Medicare recipients. The clinical pharmacists at [facility] have begun to conduct these Wellness Visits which will also include a thorough review of all your medications.    As you primary medical provider recommend that you make an appointment for your Annual Wellness Visit if you have not done so already this year.  You may set up this appointment before you leave today or you may call back ([phone]) and schedule an appointment.  Please make sure when you call that you mention that you are scheduling your Annual Wellness Visit with the clinical pharmacist so that the appointment may be made  for the proper length of time.     Continue current medications. Continue good therapeutic lifestyle changes which include good diet and exercise. Fall precautions discussed with patient. If an FOBT was given today- please return it to our front desk. If you are over 7 years old - you may need Prevnar 69 or the adult Pneumonia vaccine.  Flu Shots will be available at our office starting mid- September. Please call and schedule a FLU CLINIC APPOINTMENT.      Arrie Senate MD

## 2014-07-24 LAB — HEPATIC FUNCTION PANEL
ALK PHOS: 98 IU/L (ref 39–117)
ALT: 17 IU/L (ref 0–44)
AST: 22 IU/L (ref 0–40)
Albumin: 4.1 g/dL (ref 3.5–4.7)
Bilirubin, Direct: 0.28 mg/dL (ref 0.00–0.40)
TOTAL PROTEIN: 6.8 g/dL (ref 6.0–8.5)
Total Bilirubin: 0.9 mg/dL (ref 0.0–1.2)

## 2014-07-24 LAB — BMP8+EGFR
BUN/Creatinine Ratio: 12 (ref 10–22)
BUN: 11 mg/dL (ref 8–27)
CHLORIDE: 92 mmol/L — AB (ref 97–108)
CO2: 30 mmol/L — ABNORMAL HIGH (ref 18–29)
Calcium: 8.9 mg/dL (ref 8.6–10.2)
Creatinine, Ser: 0.94 mg/dL (ref 0.76–1.27)
GFR calc non Af Amer: 76 mL/min/{1.73_m2} (ref >59)
GFR, EST AFRICAN AMERICAN: 88 mL/min/{1.73_m2} (ref >59)
Glucose: 136 mg/dL — ABNORMAL HIGH (ref 65–99)
POTASSIUM: 4.1 mmol/L (ref 3.5–5.2)
Sodium: 135 mmol/L (ref 134–144)

## 2014-07-24 LAB — LIPID PANEL
CHOLESTEROL TOTAL: 113 mg/dL (ref 100–199)
Chol/HDL Ratio: 2.4 ratio units (ref 0.0–5.0)
HDL: 48 mg/dL (ref >39)
LDL CALC: 45 mg/dL (ref 0–99)
TRIGLYCERIDES: 99 mg/dL (ref 0–149)
VLDL Cholesterol Cal: 20 mg/dL (ref 5–40)

## 2014-07-24 LAB — VITAMIN D 25 HYDROXY (VIT D DEFICIENCY, FRACTURES): Vit D, 25-Hydroxy: 48.7 ng/mL (ref 30.0–100.0)

## 2014-08-02 ENCOUNTER — Ambulatory Visit (INDEPENDENT_AMBULATORY_CARE_PROVIDER_SITE_OTHER): Payer: Medicare Other | Admitting: Pharmacist

## 2014-08-02 ENCOUNTER — Encounter: Payer: Self-pay | Admitting: Pharmacist

## 2014-08-02 DIAGNOSIS — I4891 Unspecified atrial fibrillation: Secondary | ICD-10-CM

## 2014-08-02 DIAGNOSIS — I4819 Other persistent atrial fibrillation: Secondary | ICD-10-CM

## 2014-08-02 DIAGNOSIS — I2510 Atherosclerotic heart disease of native coronary artery without angina pectoris: Secondary | ICD-10-CM

## 2014-08-02 LAB — POCT INR: INR: 1.6

## 2014-08-02 MED ORDER — METOPROLOL TARTRATE 100 MG PO TABS: 150.0000 mg | ORAL_TABLET | Freq: Two times a day (BID) | ORAL | Status: DC

## 2014-08-02 NOTE — Patient Instructions (Signed)
Anticoagulation Dose Instructions as of 08/02/2014     Glynis Smiles Tue Wed Thu Fri Sat   New Dose 2.5 mg 5 mg 2.5 mg 2.5 mg 5 mg 2.5 mg 2.5 mg    Description       Continue warfarin  1/2 tablet daily except 1 tablet on Mondays and Thursdays.      INR was 1.6 today

## 2014-08-02 NOTE — Progress Notes (Signed)
Filled patient's prescription boxes for 2 weeks.  I asked patient about 2 days of morning medication that were not taken.  He was unaware and unable to tell me what might have happened.  I voiced my concern to patient about missing medications.  He and wife has declined to go to [facility] - assisted living.   I discussed the benefits of assisted living with him.  Also check INR today - see anticoagulation notes.

## 2014-08-09 ENCOUNTER — Ambulatory Visit (INDEPENDENT_AMBULATORY_CARE_PROVIDER_SITE_OTHER): Payer: Medicare Other | Admitting: Pharmacist

## 2014-08-09 DIAGNOSIS — I2510 Atherosclerotic heart disease of native coronary artery without angina pectoris: Secondary | ICD-10-CM

## 2014-08-09 DIAGNOSIS — I482 Chronic atrial fibrillation, unspecified: Secondary | ICD-10-CM

## 2014-08-09 DIAGNOSIS — Z79899 Other long term (current) drug therapy: Secondary | ICD-10-CM

## 2014-08-09 DIAGNOSIS — I4891 Unspecified atrial fibrillation: Secondary | ICD-10-CM

## 2014-08-09 LAB — POCT INR: INR: 1.3

## 2014-08-09 NOTE — Progress Notes (Signed)
Filled patient's prescription boxes for 2 weeks.  I asked patient about 3 days of evening medication that were not taken.  He was unable to tell me what might have happened.  I voiced my concern to patient about missing medications.  He and wife has declined to go to [facility] - assisted living.  I was able to convince patient to consider in home nursing care and to allow [facility] nurse to come out again.    INR was 1.3 today.  Missed 3 doses of warfarin.  Assessment: Medication management subtherapeutic anticoagulation  Plan: Filled medication boxes for 2 week. Discussed concerns about compliance Referred for evaluation by [facility].   Anticoagulation Dose Instructions as of 08/09/2014     Glynis Smiles Tue Wed Thu Fri Sat   New Dose 2.5 mg 5 mg 2.5 mg 2.5 mg 5 mg 2.5 mg 2.5 mg    Description       Take 1 tablet for next 3 days, then Continue warfarin  1/2 tablet daily except 1 tablet on Mondays and Thursdays.     RTC in 1 week  Henrene Pastor, PharmD, CPP

## 2014-08-13 ENCOUNTER — Other Ambulatory Visit: Payer: Self-pay | Admitting: *Deleted

## 2014-08-16 ENCOUNTER — Ambulatory Visit (INDEPENDENT_AMBULATORY_CARE_PROVIDER_SITE_OTHER): Payer: Medicare Other | Admitting: Pharmacist

## 2014-08-16 ENCOUNTER — Encounter: Payer: Self-pay | Admitting: Pharmacist

## 2014-08-16 DIAGNOSIS — I4891 Unspecified atrial fibrillation: Secondary | ICD-10-CM

## 2014-08-16 DIAGNOSIS — I2510 Atherosclerotic heart disease of native coronary artery without angina pectoris: Secondary | ICD-10-CM

## 2014-08-16 DIAGNOSIS — I482 Chronic atrial fibrillation, unspecified: Secondary | ICD-10-CM

## 2014-08-16 LAB — POCT INR: INR: 2

## 2014-08-16 NOTE — Progress Notes (Signed)
Filled pill boxes today - there was not medication left in pill boxes today.

## 2014-08-16 NOTE — Patient Instructions (Signed)
Anticoagulation Dose Instructions as of 08/16/2014     [name] Tue Wed Thu Fri Sat   New Dose 2.5 mg 5 mg 2.5 mg 2.5 mg 5 mg 2.5 mg 2.5 mg    Description       Take 1 tablet for next 3 days, then Continue warfarin  1/2 tablet daily except 1 tablet on Mondays and Thursdays.      INR was 2.0 today

## 2014-08-30 ENCOUNTER — Ambulatory Visit: Payer: Self-pay

## 2014-08-30 ENCOUNTER — Ambulatory Visit (INDEPENDENT_AMBULATORY_CARE_PROVIDER_SITE_OTHER): Payer: Medicare Other | Admitting: Pharmacist

## 2014-08-30 DIAGNOSIS — I4819 Other persistent atrial fibrillation: Secondary | ICD-10-CM

## 2014-08-30 DIAGNOSIS — I481 Persistent atrial fibrillation: Secondary | ICD-10-CM

## 2014-08-30 DIAGNOSIS — I2510 Atherosclerotic heart disease of native coronary artery without angina pectoris: Secondary | ICD-10-CM

## 2014-08-30 LAB — POCT INR: INR: 1.1

## 2014-08-30 NOTE — Progress Notes (Signed)
I continue to be concerned about [name] and his wife's ability to live independently.  I had requested an assessment from [facility] from nurse and social work.  Patient states that he has not been contacted by them.  Gina Rintleman contacted [facility] office while patient was in office and it appears that they called him and he declined their help.  I spoke to patient about importance of letting [facility] come to assess social services that might assist he and his wife in continuing to live independently.  He states that he is open to intervention. Gina is meeting with Julie Farmer, RN with [facility] tomorrow and they are to discuss plan of action.   I also continue to have frank conversation with patient about assisted living and he declines.  Dr Moore has met with patient's Health Care POA and POA left decision up to Gregory and [name].    

## 2014-09-05 ENCOUNTER — Ambulatory Visit (INDEPENDENT_AMBULATORY_CARE_PROVIDER_SITE_OTHER): Payer: Medicare Other

## 2014-09-05 DIAGNOSIS — Z23 Encounter for immunization: Secondary | ICD-10-CM

## 2014-09-06 ENCOUNTER — Telehealth: Payer: Self-pay | Admitting: Family Medicine

## 2014-09-06 NOTE — Telephone Encounter (Signed)
Call was about wife , not [name].

## 2014-09-13 ENCOUNTER — Ambulatory Visit: Payer: Self-pay

## 2014-09-13 ENCOUNTER — Encounter: Payer: Self-pay | Admitting: Pharmacist

## 2014-09-13 ENCOUNTER — Ambulatory Visit (INDEPENDENT_AMBULATORY_CARE_PROVIDER_SITE_OTHER): Payer: Medicare Other | Admitting: Pharmacist

## 2014-09-13 DIAGNOSIS — I481 Persistent atrial fibrillation: Secondary | ICD-10-CM

## 2014-09-13 DIAGNOSIS — I2510 Atherosclerotic heart disease of native coronary artery without angina pectoris: Secondary | ICD-10-CM

## 2014-09-13 DIAGNOSIS — I4819 Other persistent atrial fibrillation: Secondary | ICD-10-CM

## 2014-09-13 LAB — POCT INR: INR: 1.3

## 2014-09-13 NOTE — Patient Instructions (Signed)
Anticoagulation Dose Instructions as of 09/13/2014     Gregory SmilesSun Mon Tue Wed Thu Fri Sat   New Dose 5 mg 2.5 mg 5 mg 2.5 mg 7.5 mg 7.5 mg 5 mg   Alt Week 5 mg 2.5 mg 5 mg 2.5 mg 5 mg 2.5 mg 5 mg    Description       Take 1 and 1/2 tablet for 2 days, then change warfarin to warfarin 5mg  1 tablet daily except 1/2 tablet on Mondays, Wednesdays and Fridays.      INR was 1.3 today

## 2014-09-13 NOTE — Progress Notes (Signed)
anticoag visit and fill pill boxes

## 2014-09-27 ENCOUNTER — Ambulatory Visit (INDEPENDENT_AMBULATORY_CARE_PROVIDER_SITE_OTHER): Payer: Medicare Other | Admitting: Pharmacist

## 2014-09-27 ENCOUNTER — Other Ambulatory Visit: Payer: Self-pay | Admitting: Family Medicine

## 2014-09-27 DIAGNOSIS — I4819 Other persistent atrial fibrillation: Secondary | ICD-10-CM

## 2014-09-27 DIAGNOSIS — I4891 Unspecified atrial fibrillation: Secondary | ICD-10-CM

## 2014-09-27 DIAGNOSIS — I2510 Atherosclerotic heart disease of native coronary artery without angina pectoris: Secondary | ICD-10-CM

## 2014-09-27 DIAGNOSIS — I481 Persistent atrial fibrillation: Secondary | ICD-10-CM

## 2014-09-27 LAB — POCT INR: INR: 2

## 2014-09-27 MED ORDER — DIGOXIN 125 MCG PO TABS: 0.1250 mg | ORAL_TABLET | Freq: Every day | ORAL | Status: DC

## 2014-09-27 MED ORDER — WARFARIN SODIUM 5 MG PO TABS: ORAL_TABLET | ORAL | Status: DC

## 2014-09-27 NOTE — Progress Notes (Signed)
anticoag visit and fill pill boxes, There were no mediations left in patient's pill box today.  [facility] me [facility] and was denied.   I had a discussion with him about making arrangement for medications and transportation.  I suggested that he and his wife consider using a pharmacy that will package and deliver their medications.  He will consider.   Henrene Pastor.Kimberly Nieland, PharmD, CPP

## 2014-10-01 ENCOUNTER — Telehealth: Payer: Self-pay | Admitting: Family Medicine

## 2014-10-01 NOTE — Telephone Encounter (Signed)
done

## 2014-10-08 ENCOUNTER — Telehealth: Payer: Self-pay | Admitting: Pharmacist

## 2014-10-08 MED ORDER — NITROGLYCERIN 0.4 MG SL SUBL: 0.4000 mg | SUBLINGUAL_TABLET | SUBLINGUAL | Status: DC | PRN

## 2014-10-08 NOTE — Telephone Encounter (Signed)
[name] is going to start filling patient's medications/ packaging and delivering.  [name] states they need rx's for NTG for [name]. Rx's sent to pharmacy

## 2014-10-11 ENCOUNTER — Ambulatory Visit: Payer: Self-pay

## 2014-10-15 ENCOUNTER — Encounter: Payer: Self-pay | Admitting: Pharmacist

## 2014-10-15 ENCOUNTER — Ambulatory Visit (INDEPENDENT_AMBULATORY_CARE_PROVIDER_SITE_OTHER): Payer: Medicare Other | Admitting: Pharmacist

## 2014-10-15 DIAGNOSIS — I2510 Atherosclerotic heart disease of native coronary artery without angina pectoris: Secondary | ICD-10-CM

## 2014-10-15 DIAGNOSIS — I481 Persistent atrial fibrillation: Secondary | ICD-10-CM

## 2014-10-15 LAB — POCT INR: INR: 2.7

## 2014-10-15 NOTE — Progress Notes (Signed)
See anticoagulation notes.

## 2014-10-23 ENCOUNTER — Telehealth: Payer: Self-pay | Admitting: *Deleted

## 2014-10-23 NOTE — Telephone Encounter (Signed)
Dub needs a tooth extracted and needs to stop coumadin prior-  Please address the time frame to stop this and send the message back to jamie   i will fax the order to them  They want it on paper

## 2014-10-24 NOTE — Telephone Encounter (Signed)
Holding warfarin prior to tooth extraction is not recommended however some dentist still like to do so.  I recommend holding warfarin 2 days prior to procedure.  You will need to find out date of procedure and [facility] as they are filling patient's medications weekly in dispensing cards and they will need to leave warfarin out on indicated days.

## 2014-10-25 ENCOUNTER — Ambulatory Visit: Payer: Self-pay

## 2014-10-25 NOTE — Telephone Encounter (Signed)
Done and faxed

## 2014-11-03 ENCOUNTER — Other Ambulatory Visit: Payer: Self-pay | Admitting: Family Medicine

## 2014-11-07 ENCOUNTER — Other Ambulatory Visit: Payer: Self-pay | Admitting: *Deleted

## 2014-11-07 MED ORDER — MIRABEGRON ER 25 MG PO TB24: 25.0000 mg | ORAL_TABLET | Freq: Every day | ORAL | Status: DC

## 2014-11-08 ENCOUNTER — Ambulatory Visit: Payer: Self-pay

## 2014-11-12 ENCOUNTER — Ambulatory Visit (INDEPENDENT_AMBULATORY_CARE_PROVIDER_SITE_OTHER): Payer: Medicare Other | Admitting: Pharmacist

## 2014-11-12 DIAGNOSIS — R791 Abnormal coagulation profile: Secondary | ICD-10-CM

## 2014-11-12 LAB — POCT CBC
Granulocyte percent: 76.1 %G (ref 37–80)
HCT, POC: 41.9 % — AB (ref 43.5–53.7)
Hemoglobin: 13.3 g/dL — AB (ref 14.1–18.1)
LYMPH, POC: 1.3 (ref 0.6–3.4)
MCH, POC: 30.5 pg (ref 27–31.2)
MCHC: 31.7 g/dL — AB (ref 31.8–35.4)
MCV: 96.2 fL (ref 80–97)
MPV: 7.3 fL (ref 0–99.8)
PLATELET COUNT, POC: 191 10*3/uL (ref 142–424)
POC GRANULOCYTE: 4.6 (ref 2–6.9)
POC LYMPH PERCENT: 20.7 %L (ref 10–50)
RBC: 4.4 M/uL — AB (ref 4.69–6.13)
RDW, POC: 14.1 %
WBC: 6.1 10*3/uL (ref 4.6–10.2)

## 2014-11-12 LAB — POCT INR: INR: 7.2

## 2014-11-12 MED ORDER — PHYTONADIONE 5 MG PO TABS
2.5000 mg | ORAL_TABLET | Freq: Once | ORAL | Status: AC
  Administered 2014-11-12: 2.5 mg via ORAL

## 2014-11-12 NOTE — Patient Instructions (Signed)
No warfarin for next 2 days, then return to office 11/14/2014 at 1:15 to recheck protime.  INR was too thin today = 7.2

## 2014-11-12 NOTE — Progress Notes (Signed)
Subjective:     Indication: atrial fibrillation Bleeding signs/symptoms: None Thromboembolic signs/symptoms: None  Missed Coumadin doses: None Medication changes: no Dietary changes: no Bacterial/viral infection: no Other concerns: patient recently has been getting medications packages by pharmacy.  Feel that compliance has improved    Objective:    INR Today: 7.2 HBG / CBC stable Current dose: warfarin 5mg  MWF and 2.5mg  all other days   Assessment:    Supratherapeutic INR for goal of 2-3   Plan:    1. New dose: hold warfarin until recheck INR.  Drove to patient's home to remove warfarin from medication packaging.   2.  Oral vitamin K 2.5mg  given in office today due to elevated INR and patient's age  Next INR: 2 days.  Henrene Pastorammy Khaleah Duer, PharmD, CPP

## 2014-11-13 ENCOUNTER — Other Ambulatory Visit: Payer: Self-pay | Admitting: Family Medicine

## 2014-11-13 LAB — PROTIME-INR
INR: 9 — AB (ref 0.8–1.2)
Prothrombin Time: 94 s — ABNORMAL HIGH (ref 9.1–12.0)

## 2014-11-14 ENCOUNTER — Ambulatory Visit (INDEPENDENT_AMBULATORY_CARE_PROVIDER_SITE_OTHER): Payer: Medicare Other | Admitting: Pharmacist

## 2014-11-14 DIAGNOSIS — I4891 Unspecified atrial fibrillation: Secondary | ICD-10-CM

## 2014-11-14 LAB — POCT INR: INR: 2.5

## 2014-11-14 NOTE — Patient Instructions (Signed)
Anticoagulation Dose Instructions as of 11/14/2014      Gregory SmilesSun Mon Tue Wed Thu Fri Sat   New Dose 2.5 mg 2.5 mg 5 mg 2.5 mg 5 mg 2.5 mg 2.5 mg    Description        Decrease dose to 1/2 tablet daily except 1 tablet on tuesdays and thursdays.      INR was 2.5 today

## 2014-11-28 ENCOUNTER — Ambulatory Visit (INDEPENDENT_AMBULATORY_CARE_PROVIDER_SITE_OTHER): Payer: Medicare Other | Admitting: Pharmacist

## 2014-11-28 DIAGNOSIS — I481 Persistent atrial fibrillation: Secondary | ICD-10-CM

## 2014-11-28 DIAGNOSIS — I4819 Other persistent atrial fibrillation: Secondary | ICD-10-CM

## 2014-11-28 DIAGNOSIS — I4891 Unspecified atrial fibrillation: Secondary | ICD-10-CM

## 2014-11-28 LAB — POCT INR: INR: 2.1

## 2014-11-28 NOTE — Patient Instructions (Signed)
Anticoagulation Dose Instructions as of 11/28/2014      Glynis SmilesSun Mon Tue Wed Thu Fri Sat   New Dose 2.5 mg 2.5 mg 5 mg 2.5 mg 5 mg 2.5 mg 2.5 mg    Description        Continue current warfarin dose of 1/2 tablet daily except 1 tablet on tuesdays and thursdays.       INR was 2.1 today

## 2014-12-05 ENCOUNTER — Ambulatory Visit (INDEPENDENT_AMBULATORY_CARE_PROVIDER_SITE_OTHER): Payer: Medicare Other | Admitting: Family Medicine

## 2014-12-05 ENCOUNTER — Encounter: Payer: Self-pay | Admitting: Family Medicine

## 2014-12-05 VITALS — BP 120/75 | HR 96 | Temp 96.6°F | Ht 71.0 in | Wt 166.6 lb

## 2014-12-05 DIAGNOSIS — R413 Other amnesia: Secondary | ICD-10-CM

## 2014-12-05 DIAGNOSIS — E559 Vitamin D deficiency, unspecified: Secondary | ICD-10-CM

## 2014-12-05 DIAGNOSIS — I679 Cerebrovascular disease, unspecified: Secondary | ICD-10-CM

## 2014-12-05 DIAGNOSIS — G250 Essential tremor: Secondary | ICD-10-CM

## 2014-12-05 DIAGNOSIS — I2510 Atherosclerotic heart disease of native coronary artery without angina pectoris: Secondary | ICD-10-CM | POA: Diagnosis not present

## 2014-12-05 DIAGNOSIS — I481 Persistent atrial fibrillation: Secondary | ICD-10-CM | POA: Diagnosis not present

## 2014-12-05 DIAGNOSIS — I4819 Other persistent atrial fibrillation: Secondary | ICD-10-CM

## 2014-12-05 DIAGNOSIS — E119 Type 2 diabetes mellitus without complications: Secondary | ICD-10-CM

## 2014-12-05 DIAGNOSIS — N400 Enlarged prostate without lower urinary tract symptoms: Secondary | ICD-10-CM | POA: Diagnosis not present

## 2014-12-05 DIAGNOSIS — K219 Gastro-esophageal reflux disease without esophagitis: Secondary | ICD-10-CM | POA: Diagnosis not present

## 2014-12-05 DIAGNOSIS — E785 Hyperlipidemia, unspecified: Secondary | ICD-10-CM | POA: Diagnosis not present

## 2014-12-05 DIAGNOSIS — I4891 Unspecified atrial fibrillation: Secondary | ICD-10-CM | POA: Diagnosis not present

## 2014-12-05 LAB — POCT CBC
Granulocyte percent: 74.5 %G (ref 37–80)
HEMATOCRIT: 43.5 % (ref 43.5–53.7)
Hemoglobin: 13.3 g/dL — AB (ref 14.1–18.1)
Lymph, poc: 1.3 (ref 0.6–3.4)
MCH: 29.9 pg (ref 27–31.2)
MCHC: 30.7 g/dL — AB (ref 31.8–35.4)
MCV: 97.4 fL — AB (ref 80–97)
MPV: 7.5 fL (ref 0–99.8)
PLATELET COUNT, POC: 183 10*3/uL (ref 142–424)
POC Granulocyte: 4.3 (ref 2–6.9)
POC LYMPH PERCENT: 21.6 %L (ref 10–50)
RBC: 4.5 M/uL — AB (ref 4.69–6.13)
RDW, POC: 14.3 %
WBC: 5.8 10*3/uL (ref 4.6–10.2)

## 2014-12-05 NOTE — Progress Notes (Signed)
Subjective:    Patient ID: [name], male    DOB: [birth year], 81 y.o.   MRN: [id number]  HPI Pt here for follow up and management of chronic medical problems which include diabetes, hyperlipidemia and atrial fibrillation. He has his medications pre-packed by [facility] and he states that he is taking them regularly. Patient is doing well considering the circumstances that he is elderly wife live by themselves at home. The patient is no longer driving. He does have some dementia but somehow things are working out so that they can still remain in the house with support from the community. The patient is hearing deficit and he is not wearing his hearing aids today. He continues to have this tremor in both hands with the left hand being worse than the right hand. He has seen the neurologist in the past but was not giving out return visit. He has also seen the cardiologist on a regular basis but failed to keep the appointment in October. We will repeat arrange this for him to be seen in February and we will contact one of his church members who will see that he gets to that visit.      Patient Active Problem List   Diagnosis Date Noted  . High risk medication use 06/22/2013  . Hypokalemia 06/22/2013  . Memory deficits 05/05/2013  . Essential and other specified forms of tremor 05/05/2013  . Cerebrovascular disease, unspecified 01/16/2013  . CAD (coronary artery disease)   . BPH (benign prostatic hypertrophy) 02/11/2011  . Premature atrial complexes 02/11/2011  . Panic attack 02/11/2011  . Diabetes mellitus 02/11/2011  . Hyperlipidemia 02/12/2010  . Atrial fibrillation, persistent 10/30/2009  . GERD 03/12/2009  . ULCERATIVE COLITIS-LEFT SIDE 03/12/2009   Outpatient Encounter Prescriptions as of 12/05/2014  Medication Sig  . atorvastatin (LIPITOR) 40 MG tablet TAKE ONE TABLET AT BEDTIME  . Cholecalciferol (VITAMIN D) 1000 UNITS capsule Take 1 capsule (1,000 Units total) by mouth  daily.  . CVS B-12 1000 MCG TBCR TAKE 1 TABLET BY MOUTH EVERY DAY  . digoxin (LANOXIN) 0.125 MG tablet Take 1 tablet (0.125 mg total) by mouth daily.  . metoprolol (LOPRESSOR) 100 MG tablet TAKE 1 & 1/2 TABLETS TWICE A DAY  . mirabegron ER (MYRBETRIQ) 25 MG TB24 tablet Take 1 tablet (25 mg total) by mouth daily.  Marland Kitchen PARoxetine (PAXIL) 30 MG tablet Take 30 mg by mouth daily.  Marland Kitchen sulfaSALAzine (AZULFIDINE) 500 MG EC tablet 1 tablet (500 mg total) 2 (two) times daily. Take 500 mg twice a day by mouth for 1 week, then 1000 mg BID  . warfarin (COUMADIN) 5 MG tablet Take 1 tablet daily or as directed by coumadin clinic  . nitroGLYCERIN (NITROSTAT) 0.4 MG SL tablet Place 1 tablet (0.4 mg total) under the tongue every 5 (five) minutes as needed for chest pain. (Patient not taking: Reported on 12/05/2014)    Review of Systems  Constitutional: Negative.   HENT: Negative.   Eyes: Negative.   Respiratory: Negative.   Cardiovascular: Negative.   Gastrointestinal: Negative.   Endocrine: Negative.   Genitourinary: Negative.   Musculoskeletal: Negative.   Skin: Negative.   Allergic/Immunologic: Negative.   Neurological: Negative.   Hematological: Negative.   Psychiatric/Behavioral: Negative.        Objective:   Physical Exam  Constitutional: He is oriented to person, place, and time. No distress.  Elderly appearing somewhat disheveled 81 year old man with a tremor in both hands.  HENT:  Head: Normocephalic  and atraumatic.  Right Ear: External ear normal.  Left Ear: External ear normal.  Nose: Nose normal.  Mouth/Throat: Oropharynx is clear and moist. No oropharyngeal exudate.  Eyes: Conjunctivae and EOM are normal. Pupils are equal, round, and reactive to light. Right eye exhibits no discharge. Left eye exhibits no discharge. No scleral icterus.  Neck: Normal range of motion. Neck supple. No thyromegaly present.  The neck was without bruits or adenopathy.  Cardiovascular: Normal rate, regular  rhythm and normal heart sounds.  Exam reveals no gallop and no friction rub.   No murmur heard. The pulses in the feet were difficult to palpate. The heart was irregular irregular at 84/m  Pulmonary/Chest: Effort normal and breath sounds normal. No respiratory distress. He has no wheezes. He has no rales. He exhibits no tenderness.  Abdominal: Soft. Bowel sounds are normal. He exhibits no mass. There is no tenderness. There is no rebound and no guarding.  The abdomen was scaphoid with out organ enlargement or masses or inguinal adenopathy.  Genitourinary: Penis normal.  The prostate was enlarged and firm. There was some large nonthrombosed external hemorrhoids. There were no rectal masses and there was no inguinal hernia palpated.  Musculoskeletal: Normal range of motion. He exhibits no edema or tenderness.  Lymphadenopathy:    He has no cervical adenopathy.  Neurological: He is alert and oriented to person, place, and time. He has normal reflexes. No cranial nerve deficit.  The patient had a bilateral tremor involving both hands. He was somewhat slow in his movements and he his memory was impaired. He did know his age and he did tell me that his feet went out last night.  Skin: Skin is warm and dry. No rash noted. No erythema. No pallor.  Psychiatric: He has a normal mood and affect. His behavior is normal. Thought content normal.  Because of his memory impairment his ability to make important decisions is diminished.  Nursing note and vitals reviewed.  BP 120/75 mmHg  Pulse 96  Temp(Src) 96.6 F (35.9 C) (Oral)  Ht _0  (1.803 m)  Wt 166 lb 9.6 oz (75.569 kg)  BMI 23.25 kg/m2        Assessment & Plan:  1. Persistent atrial fibrillation -A missed cardiology appointment will be re-arrange for February and we will hope to get one of his church members to see that he gets there for that visit. - POCT CBC - BMP8+EGFR  2. BPH (benign prostatic hypertrophy) - POCT CBC  3. Vitamin  D deficiency -Continue vitamin D and any adjustment will be made after lab work is returned - POCT CBC - Vit D  25 hydroxy (rtn osteoporosis monitoring)  4. Gastroesophageal reflux disease, esophagitis presence not specified -Continue to watch fried foods and of course avoid all NSAIDs - POCT CBC - Lipid panel - Hepatic function panel  5. Dyslipidemia -Continue healthy diet habits and atorvastatin - POCT CBC - Lipid panel - Hepatic function panel  6. Type 2 diabetes mellitus without complication -The patient indicates that he does not check his blood sugars at home anymore because they have been good in the past. We will make any adjustment to or had any medication refill as necessary once we get his lab work back. - POCT CBC - BMP8+EGFR  7. Essential tremor -No specific treatment for the tremor and no appointment was scheduled for return to the neurologist at the last visit.  8. Memory deficits -Continue healthy eating habits  9. ASCVD (arteriosclerotic cardiovascular  disease) -Continue current cholesterol medicine which is atorvastatin -Visit reschedule with cardiology for February  10. Cerebrovascular disease  Patient Instructions                       Medicare Annual Wellness Visit  [place] and the medical providers at [facility] strive to bring you the best medical care.  In doing so we not only want to address your current medical conditions and concerns but also to detect new conditions early and prevent illness, disease and health-related problems.    Medicare offers a yearly Wellness Visit which allows our clinical staff to assess your need for preventative services including immunizations, lifestyle education, counseling to decrease risk of preventable diseases and screening for fall risk and other medical concerns.    This visit is provided free of charge (no copay) for all Medicare recipients. The clinical pharmacists at [facility] have begun to conduct these Wellness Visits which will also include a thorough review of all your medications.    As you primary medical provider recommend that you make an appointment for your Annual Wellness Visit if you have not done so already this year.  You may set up this appointment before you leave today or you may call back ([phone]) and schedule an appointment.  Please make sure when you call that you mention that you are scheduling your Annual Wellness Visit with the clinical pharmacist so that the appointment may be made for the proper length of time.     Continue current medications. Continue good therapeutic lifestyle changes which include good diet and exercise. Fall precautions discussed with patient. If an FOBT was given today- please return it to our front desk. If you are over 24 years old - you may need Prevnar 84 or the adult Pneumonia vaccine.  Flu Shots will be available at our office starting mid- September. Please call and schedule a FLU CLINIC APPOINTMENT.   Please keep follow-up appointment with the cardiologist in February An appointment with the neurologist has not been planned and this will be only if needed. Please continue your current medicines and be careful not to put yourself at risk for falling   Arrie Senate MD

## 2014-12-05 NOTE — Patient Instructions (Addendum)
Medicare Annual Wellness Visit  [place] and the medical providers at [facility] strive to bring you the best medical care.  In doing so we not only want to address your current medical conditions and concerns but also to detect new conditions early and prevent illness, disease and health-related problems.    Medicare offers a yearly Wellness Visit which allows our clinical staff to assess your need for preventative services including immunizations, lifestyle education, counseling to decrease risk of preventable diseases and screening for fall risk and other medical concerns.    This visit is provided free of charge (no copay) for all Medicare recipients. The clinical pharmacists at [facility] have begun to conduct these Wellness Visits which will also include a thorough review of all your medications.    As you primary medical provider recommend that you make an appointment for your Annual Wellness Visit if you have not done so already this year.  You may set up this appointment before you leave today or you may call back [phone]) and schedule an appointment.  Please make sure when you call that you mention that you are scheduling your Annual Wellness Visit with the clinical pharmacist so that the appointment may be made for the proper length of time.     Continue current medications. Continue good therapeutic lifestyle changes which include good diet and exercise. Fall precautions discussed with patient. If an FOBT was given today- please return it to our front desk. If you are over 79 years old - you may need Prevnar 13 or the adult Pneumonia vaccine.  Flu Shots will be available at our office starting mid- September. Please call and schedule a FLU CLINIC APPOINTMENT.   Please keep follow-up appointment with the cardiologist in February An appointment with the neurologist has not been planned and this will be only if  needed. Please continue your current medicines and be careful not to put yourself at risk for falling

## 2014-12-06 ENCOUNTER — Telehealth: Payer: Self-pay | Admitting: *Deleted

## 2014-12-06 LAB — BMP8+EGFR
BUN / CREAT RATIO: 10 (ref 10–22)
BUN: 10 mg/dL (ref 8–27)
CO2: 30 mmol/L — ABNORMAL HIGH (ref 18–29)
Calcium: 9.1 mg/dL (ref 8.6–10.2)
Chloride: 94 mmol/L — ABNORMAL LOW (ref 97–108)
Creatinine, Ser: 0.99 mg/dL (ref 0.76–1.27)
GFR calc Af Amer: 82 mL/min/{1.73_m2} (ref >59)
GFR calc non Af Amer: 71 mL/min/{1.73_m2} (ref >59)
Glucose: 128 mg/dL — ABNORMAL HIGH (ref 65–99)
POTASSIUM: 4.3 mmol/L (ref 3.5–5.2)
Sodium: 137 mmol/L (ref 134–144)

## 2014-12-06 LAB — HEPATIC FUNCTION PANEL
ALT: 16 IU/L (ref 0–44)
AST: 23 IU/L (ref 0–40)
Albumin: 4 g/dL (ref 3.5–4.7)
Alkaline Phosphatase: 87 IU/L (ref 39–117)
BILIRUBIN TOTAL: 0.9 mg/dL (ref 0.0–1.2)
Bilirubin, Direct: 0.31 mg/dL (ref 0.00–0.40)
Total Protein: 6.6 g/dL (ref 6.0–8.5)

## 2014-12-06 LAB — LIPID PANEL
Chol/HDL Ratio: 2.1 ratio units (ref 0.0–5.0)
Cholesterol, Total: 110 mg/dL (ref 100–199)
HDL: 52 mg/dL (ref >39)
LDL CALC: 45 mg/dL (ref 0–99)
TRIGLYCERIDES: 63 mg/dL (ref 0–149)
VLDL Cholesterol Cal: 13 mg/dL (ref 5–40)

## 2014-12-06 LAB — VITAMIN D 25 HYDROXY (VIT D DEFICIENCY, FRACTURES): Vit D, 25-Hydroxy: 47 ng/mL (ref 30.0–100.0)

## 2014-12-06 NOTE — Telephone Encounter (Signed)
-----   Message from Ernestina Pennaonald W Moore, MD sent at 12/06/2014  7:09 AM EST ----- The blood sugars elevated at 128. The creatinine, the most important kidney function test is within normal limits. The electrolytes are within normal limits except the chloride is slightly decreased. This is been the case in the past and this reading is consistent with past readings. All cholesterol numbers with traditional lipid testing are excellent and at goal.------ continue atorvastatin All liver function tests are within normal limits The vitamin D level is good and within normal limits, continue current treatment--- continue vitamin D3 1000 daily

## 2014-12-06 NOTE — Telephone Encounter (Signed)
Pt notified of results Verbalizes understanding 

## 2014-12-27 ENCOUNTER — Telehealth: Payer: Self-pay | Admitting: Family Medicine

## 2014-12-28 NOTE — Telephone Encounter (Signed)
Spoke with [name] to make him aware of the concern He will speak with other caregviers regarding this

## 2014-12-31 ENCOUNTER — Other Ambulatory Visit: Payer: Self-pay | Admitting: Family Medicine

## 2015-01-02 ENCOUNTER — Ambulatory Visit: Payer: Medicare Other | Admitting: Cardiology

## 2015-01-03 ENCOUNTER — Ambulatory Visit (INDEPENDENT_AMBULATORY_CARE_PROVIDER_SITE_OTHER): Payer: Medicare Other | Admitting: Pharmacist

## 2015-01-03 DIAGNOSIS — I4891 Unspecified atrial fibrillation: Secondary | ICD-10-CM

## 2015-01-03 LAB — POCT INR: INR: 2.4

## 2015-01-03 NOTE — Patient Instructions (Signed)
Anticoagulation Dose Instructions as of 01/03/2015      Glynis SmilesSun Mon Tue Wed Thu Fri Sat   New Dose 2.5 mg 2.5 mg 5 mg 2.5 mg 5 mg 2.5 mg 2.5 mg    Description        Continue current warfarin dose of 1/2 tablet daily except 1 tablet on tuesdays and thursdays.      INR was 2.4 today

## 2015-01-07 ENCOUNTER — Other Ambulatory Visit: Payer: Self-pay | Admitting: Family Medicine

## 2015-01-11 ENCOUNTER — Other Ambulatory Visit: Payer: Self-pay | Admitting: Family Medicine

## 2015-01-16 ENCOUNTER — Ambulatory Visit (INDEPENDENT_AMBULATORY_CARE_PROVIDER_SITE_OTHER): Payer: Medicare Other | Admitting: Cardiology

## 2015-01-16 ENCOUNTER — Ambulatory Visit: Payer: Medicare Other | Admitting: Cardiology

## 2015-01-16 ENCOUNTER — Encounter: Payer: Self-pay | Admitting: Cardiology

## 2015-01-16 VITALS — BP 96/60 | HR 95 | Ht 69.0 in | Wt 171.0 lb

## 2015-01-16 DIAGNOSIS — I481 Persistent atrial fibrillation: Secondary | ICD-10-CM | POA: Diagnosis not present

## 2015-01-16 DIAGNOSIS — I2510 Atherosclerotic heart disease of native coronary artery without angina pectoris: Secondary | ICD-10-CM

## 2015-01-16 DIAGNOSIS — I4819 Other persistent atrial fibrillation: Secondary | ICD-10-CM

## 2015-01-16 NOTE — Patient Instructions (Signed)
The current medical regimen is effective;  continue present plan and medications.  Follow up in 3 months with Dr Antoine PocheHochrein.  Thank you for choosing [facility]!!

## 2015-01-16 NOTE — Progress Notes (Signed)
HPI The patient returns for follow up of CAD.  Since I last saw him he has done OK.  He is not exercising as much because he has been caring for his wife. The patient denies any new symptoms such as chest discomfort, neck or arm discomfort. There has been no new shortness of breath, PND or orthopnea. There have been no reported palpitations, presyncope or syncope.   Allergies  Allergen Reactions  . Asa-Apap-Salicyl-Caff     Takes warfarin  . Codeine Other (See Comments)    Unknown reaction  . Nsaids Other (See Comments)    On warfarin  . Sulfonamide Derivatives Nausea And Vomiting    Current Outpatient Prescriptions  Medication Sig Dispense Refill  . atorvastatin (LIPITOR) 40 MG tablet TAKE ONE TABLET AT BEDTIME 30 tablet 4  . Cholecalciferol (VITAMIN D) 1000 UNITS capsule Take 1 capsule (1,000 Units total) by mouth daily.    . [facility] B-12 1000 MCG TBCR TAKE 1 TABLET BY MOUTH EVERY DAY 60 tablet 2  . digoxin (LANOXIN) 0.125 MG tablet TAKE 1 TABLET IN THE MORNING 30 tablet 1  . metoprolol (LOPRESSOR) 100 MG tablet TAKE 1 & 1/2 TABLETS TWICE A DAY 90 tablet 1  . mirabegron ER (MYRBETRIQ) 25 MG TB24 tablet Take 1 tablet (25 mg total) by mouth daily. 30 tablet 3  . nitroGLYCERIN (NITROSTAT) 0.4 MG SL tablet Place 1 tablet (0.4 mg total) under the tongue every 5 (five) minutes as needed for chest pain. 25 tablet 1  . PARoxetine (PAXIL) 30 MG tablet Take 30 mg by mouth daily.    Marland Kitchen. warfarin (COUMADIN) 5 MG tablet Take 1 tablet daily or as directed by coumadin clinic 30 tablet 3  . sulfaSALAzine (AZULFIDINE) 500 MG EC tablet 1 tablet (500 mg total) 2 (two) times daily. Take 500 mg twice a day by mouth for 1 week, then 1000 mg BID 60 tablet 0   No current facility-administered medications for this visit.    Past Medical History  Diagnosis Date  . Dyslipidemia   . Anxiety   . GIB (gastrointestinal bleeding)   . Obesity   . UC (ulcerative colitis confined to rectum)     Left-sided  .  BPH (benign prostatic hyperplasia)   . Diverticulosis   . GERD (gastroesophageal reflux disease)   . Hemorrhoids   . Diabetes mellitus   . Tremor   . CAD (coronary artery disease)     Anterior MI and DES stenting 2004, Vfib arrest; left main 25% stenosis, LAD 80% stenosis, 99% stenosis, 25% circumflex stenosis, 25% ramus intermedius stenosis, 50-40% right coronary artery stenosis, 90% RV branch stenosis with right to left collaterals; EF 55% with mild naterior hypokinesis (underwent Taxus stenting at the time) . Stress perfusion study January 2012 EF 48% with old apical infarct  . Hyperlipidemia   . Hyperplastic colon polyp   . Myocardial infarction   . Hypertension   . Stroke   . Memory deficits 05/05/2013  . Essential and other specified forms of tremor 05/05/2013  . Hearing deficit     Wears hearing aids  . Cataract   . Atrial fibrillation, persistent     Past Surgical History  Procedure Laterality Date  . Hammer toe surgery      right  . Inguinal hernia repair      right, bilateral  . Coronary angioplasty with stent placement      Taxus stenting  . Cataract extraction w/phaco  10/27/2012    Procedure:  CATARACT EXTRACTION PHACO AND INTRAOCULAR LENS PLACEMENT (IOC);  Surgeon: Gregory Payor, MD;  Location: [facility];  Service: Ophthalmology;  Laterality: Left;  CDE 22.67    ROS: As stated in the HPI and negative for all other systems.  PHYSICAL EXAM BP 96/60 mmHg  Pulse 95  Ht  (1.753 m)  Wt 171 lb (77.565 kg)  BMI 25.24 kg/m2 GENERAL:  Well appearing NECK:  No jugular venous distention, waveform within normal limits, carotid upstroke brisk and symmetric, no bruits, no thyromegaly LUNGS:  Clear to auscultation bilaterally HEART:  PMI not displaced or sustained,S1 and S2 within normal limits, no S3, no clicks, no rubs, no murmurs, irregular ABD:  Flat, positive bowel sounds normal in frequency in pitch, no bruits, no rebound, no guarding, no midline pulsatile mass, no  hepatomegaly, no splenomegaly EXT:  2 plus pulses throughout, mild left ankle edema, no cyanosis no clubbing NEURO:  Cranial nerves II through XII grossly intact, motor grossly intact throughout, resting tremor severe   ASSESSMENT AND PLAN  CAD (coronary artery disease) -  The patient has no new sypmtoms.  No further cardiovascular testing is indicated.  We will continue with aggressive risk reduction and meds as listed.  His last stress test was in 2012.    DYSLIPIDEMIA -  This is followed closely by Dr. Christell [name].  I will defer to his management.    FIBRILLATION, ATRIAL -  [name] has a CHA2DS2 - VASc score of 7 with a risk of stroke of 9.6%.   He tolerates warfarin.  He could not afford Xarelto.    He will continue with meds as listed. Because of the CVA last year and TIA in January he will remain on ASA.

## 2015-01-22 ENCOUNTER — Telehealth: Payer: Self-pay | Admitting: Pharmacist

## 2015-01-23 NOTE — Telephone Encounter (Signed)
#  21 myrbetriq 50mg  samples  - take 1/2 tablet daily sent to pharmacy to fill patient's pill boxes.

## 2015-02-07 ENCOUNTER — Ambulatory Visit (INDEPENDENT_AMBULATORY_CARE_PROVIDER_SITE_OTHER): Payer: Medicare Other | Admitting: Pharmacist

## 2015-02-07 DIAGNOSIS — I4891 Unspecified atrial fibrillation: Secondary | ICD-10-CM | POA: Diagnosis not present

## 2015-02-07 LAB — POCT INR: INR: 2.3

## 2015-02-07 NOTE — Patient Instructions (Signed)
Anticoagulation Dose Instructions as of 02/07/2015      Gregory SmilesSun Mon Tue Wed Thu Fri Sat   New Dose 2.5 mg 2.5 mg 5 mg 2.5 mg 5 mg 2.5 mg 2.5 mg    Description        Continue current warfarin dose of 1/2 tablet daily except 1 tablet on tuesdays and thursdays.       INR was 2.3 today

## 2015-03-05 ENCOUNTER — Other Ambulatory Visit: Payer: Self-pay | Admitting: Family Medicine

## 2015-03-11 ENCOUNTER — Other Ambulatory Visit: Payer: Self-pay | Admitting: Family Medicine

## 2015-03-11 NOTE — Telephone Encounter (Signed)
Next appt set for May

## 2015-03-14 ENCOUNTER — Ambulatory Visit (INDEPENDENT_AMBULATORY_CARE_PROVIDER_SITE_OTHER): Payer: Medicare Other | Admitting: Pharmacist

## 2015-03-14 DIAGNOSIS — I4891 Unspecified atrial fibrillation: Secondary | ICD-10-CM

## 2015-03-14 LAB — POCT INR: INR: 2.3

## 2015-03-14 NOTE — Patient Instructions (Signed)
Anticoagulation Dose Instructions as of 03/14/2015      Glynis SmilesSun Mon Tue Wed Thu Fri Sat   New Dose 2.5 mg 2.5 mg 5 mg 2.5 mg 5 mg 2.5 mg 2.5 mg    Description        Continue current warfarin dose of 1/2 tablet daily except 1 tablet on tuesdays and thursdays.

## 2015-04-02 ENCOUNTER — Other Ambulatory Visit: Payer: Self-pay | Admitting: Family Medicine

## 2015-04-08 ENCOUNTER — Other Ambulatory Visit: Payer: Self-pay | Admitting: Family Medicine

## 2015-04-15 ENCOUNTER — Ambulatory Visit (INDEPENDENT_AMBULATORY_CARE_PROVIDER_SITE_OTHER): Payer: Medicare Other | Admitting: Family Medicine

## 2015-04-15 ENCOUNTER — Encounter: Payer: Self-pay | Admitting: Family Medicine

## 2015-04-15 DIAGNOSIS — E119 Type 2 diabetes mellitus without complications: Secondary | ICD-10-CM | POA: Diagnosis not present

## 2015-04-15 DIAGNOSIS — E559 Vitamin D deficiency, unspecified: Secondary | ICD-10-CM | POA: Diagnosis not present

## 2015-04-15 DIAGNOSIS — I481 Persistent atrial fibrillation: Secondary | ICD-10-CM | POA: Diagnosis not present

## 2015-04-15 DIAGNOSIS — I4819 Other persistent atrial fibrillation: Secondary | ICD-10-CM

## 2015-04-15 DIAGNOSIS — I2510 Atherosclerotic heart disease of native coronary artery without angina pectoris: Secondary | ICD-10-CM

## 2015-04-15 DIAGNOSIS — E785 Hyperlipidemia, unspecified: Secondary | ICD-10-CM

## 2015-04-15 DIAGNOSIS — N400 Enlarged prostate without lower urinary tract symptoms: Secondary | ICD-10-CM | POA: Diagnosis not present

## 2015-04-15 DIAGNOSIS — R413 Other amnesia: Secondary | ICD-10-CM

## 2015-04-15 DIAGNOSIS — G250 Essential tremor: Secondary | ICD-10-CM | POA: Diagnosis not present

## 2015-04-15 DIAGNOSIS — K219 Gastro-esophageal reflux disease without esophagitis: Secondary | ICD-10-CM | POA: Diagnosis not present

## 2015-04-15 LAB — POCT CBC
Granulocyte percent: 71.2 %G (ref 37–80)
HCT, POC: 43.9 % (ref 43.5–53.7)
Hemoglobin: 13.6 g/dL — AB (ref 14.1–18.1)
Lymph, poc: 1.4 (ref 0.6–3.4)
MCH, POC: 30.3 pg (ref 27–31.2)
MCHC: 31.1 g/dL — AB (ref 31.8–35.4)
MCV: 97.5 fL — AB (ref 80–97)
MPV: 7.2 fL (ref 0–99.8)
POC Granulocyte: 4.4 (ref 2–6.9)
POC LYMPH PERCENT: 22.3 %L (ref 10–50)
Platelet Count, POC: 159 10*3/uL (ref 142–424)
RBC: 4.5 M/uL — AB (ref 4.69–6.13)
RDW, POC: 14.1 %
WBC: 6.2 10*3/uL (ref 4.6–10.2)

## 2015-04-15 LAB — POCT INR: INR: 2.1

## 2015-04-15 LAB — POCT GLYCOSYLATED HEMOGLOBIN (HGB A1C): HEMOGLOBIN A1C: 5.7

## 2015-04-15 NOTE — Progress Notes (Signed)
Subjective:    Patient ID: [name], male    DOB: October 24, 1930, 81 y.o.   MRN: [id number]  HPI Pt here for follow up and management of chronic medical problems which includes hypertension, hyperlipidemia, diabetes, Atrial Fibrillation and GERD. He states he is taking medications regularly. The patient complains of feeling nervous and being uptight at times. He comes to the visit today with his wife. They both live at home by themselves and in general free ends and church members help with seeing that they have food on the table and get their medicine so they can take them regularly. The patient is due today for lab work and will be given an FOBT to return. The patient has a history of a TIA and a strong history of heart disease. He has seen the neurologist and continues to see the cardiologist regularly. He has no further appointments with the neurologist. The patient denies chest pain, shortness of breath, trouble swallowing, heartburn indigestion nausea vomiting or diarrhea. He also denies blood in the stool. He is passing his water without problems.      Patient Active Problem List   Diagnosis Date Noted  . Cerebrovascular disease 12/05/2014  . ASCVD (arteriosclerotic cardiovascular disease) 12/05/2014  . Persistent atrial fibrillation 12/05/2014  . High risk medication use 06/22/2013  . Hypokalemia 06/22/2013  . Memory deficits 05/05/2013  . Essential and other specified forms of tremor 05/05/2013  . Cerebrovascular disease, unspecified 01/16/2013  . CAD (coronary artery disease)   . BPH (benign prostatic hypertrophy) 02/11/2011  . Premature atrial complexes 02/11/2011  . Panic attack 02/11/2011  . Diabetes mellitus 02/11/2011  . Hyperlipidemia 02/12/2010  . Atrial fibrillation, persistent 10/30/2009  . GERD 03/12/2009  . ULCERATIVE COLITIS-LEFT SIDE 03/12/2009   Outpatient Encounter Prescriptions as of 04/15/2015  Medication Sig  . atorvastatin (LIPITOR) 40 MG tablet TAKE  ONE TABLET AT BEDTIME  . Cholecalciferol (VITAMIN D) 1000 UNITS capsule Take 1 capsule (1,000 Units total) by mouth daily.  . CVS B-12 1000 MCG TBCR TAKE 1 TABLET BY MOUTH EVERY DAY  . digoxin (LANOXIN) 0.125 MG tablet TAKE 1 TABLET IN THE MORNING  . metoprolol (LOPRESSOR) 100 MG tablet TAKE 1 & 1/2 TABLETS TWICE A DAY  . mirabegron ER (MYRBETRIQ) 25 MG TB24 tablet Take 1 tablet (25 mg total) by mouth daily.  . nitroGLYCERIN (NITROSTAT) 0.4 MG SL tablet Place 1 tablet (0.4 mg total) under the tongue every 5 (five) minutes as needed for chest pain.  Marland Kitchen PARoxetine (PAXIL) 30 MG tablet TAKE 1 TABLET IN THE MORNING  . sulfaSALAzine (AZULFIDINE) 500 MG EC tablet 1 tablet (500 mg total) 2 (two) times daily. Take 500 mg twice a day by mouth for 1 week, then 1000 mg BID  . vitamin B-12 (CYANOCOBALAMIN) 1000 MCG tablet TAKE 1 TABLET IN THE MORNING  . warfarin (COUMADIN) 5 MG tablet TAKE AS DIRECTED PER CLINIC 1/2 MWF 1TTHSS   No facility-administered encounter medications on file as of 04/15/2015.      Review of Systems  Constitutional: Negative.   HENT: Negative.   Eyes: Negative.   Respiratory: Negative.   Cardiovascular: Negative.   Gastrointestinal: Negative.   Endocrine: Negative.   Genitourinary: Negative.   Musculoskeletal: Negative.   Skin: Negative.   Allergic/Immunologic: Negative.   Neurological: Negative.   Hematological: Negative.   Psychiatric/Behavioral: The patient is nervous/anxious (feels "uptight" at times).        Objective:   Physical Exam  Constitutional: He is oriented  to person, place, and time. He appears well-developed and well-nourished. He appears distressed.  The patient is elderly appearing and and has a tremor in both hands and he forgot to wear his hearing aids to get a survey has trouble hearing during the conversation.  HENT:  Head: Normocephalic and atraumatic.  Right Ear: External ear normal.  Left Ear: External ear normal.  Nose: Nose normal.    Mouth/Throat: Oropharynx is clear and moist. No oropharyngeal exudate.  Eyes: Conjunctivae and EOM are normal. Pupils are equal, round, and reactive to light. Right eye exhibits no discharge. Left eye exhibits no discharge. No scleral icterus.  Neck: Normal range of motion. Neck supple. No thyromegaly present.  Without bruits or thyromegaly  Cardiovascular: Normal rate, regular rhythm and normal heart sounds.  Exam reveals no gallop and no friction rub.   No murmur heard. He had decreased pedal pulses. The rhythm is regular today at 72/m  Pulmonary/Chest: Effort normal and breath sounds normal. No respiratory distress. He has no wheezes. He has no rales. He exhibits no tenderness.  Clear anteriorly and posteriorly  Abdominal: Soft. Bowel sounds are normal. He exhibits no mass. There is no tenderness. There is no rebound and no guarding.  The abdomen was nontender to palpation and there were no liver or spleen enlargement or abdominal masses. There were no bruits.  Musculoskeletal: Normal range of motion. He exhibits no edema or tenderness.  Lymphadenopathy:    He has no cervical adenopathy.  Neurological: He is alert and oriented to person, place, and time. He has normal reflexes. No cranial nerve deficit.  The patient has a tremor in both hands left greater than right and also cannot hear because he left his hearing aids out.  Skin: Skin is warm and dry. No rash noted. No erythema. No pallor.  Psychiatric: He has a normal mood and affect. His behavior is normal. Judgment and thought content normal.  Nursing note and vitals reviewed.  BP 135/93 mmHg  Pulse 87  Temp(Src) 97.4 F (36.3 C) (Oral)  Ht 5' 9"  (1.753 m)  Wt 169 lb (76.658 kg)  BMI 24.95 kg/m2        Assessment & Plan:  1. BPH (benign prostatic hypertrophy) -This was not examined today and the patient is having no symptoms with this. - POCT CBC  2. Vitamin D deficiency -He will continue to take current treatment  pending results of lab work - POCT CBC - Vit D  25 hydroxy (rtn osteoporosis monitoring)  3. Gastroesophageal reflux disease, esophagitis presence not specified -He is having no symptoms with this today and his complaints - POCT CBC - Hepatic function panel  4. Type 2 diabetes mellitus without complication -His T2K was good today and he will continue with current treatment - POCT CBC - POCT glycosylated hemoglobin (Hb A1C) - BMP8+EGFR  5. Memory deficits -He continues to have a memory deficit problem but there was prominent today was with his hearing. - POCT CBC  6. Essential tremor -He has been seen by the neurologist and was told that the treatment would be too expensive for him. He does not have any appointment scheduled for follow-up and he does not want to go back at this time. - POCT CBC  7. ASCVD (arteriosclerotic cardiovascular disease) -He has had an MI in the past and is followed periodically by the cardiologist and actually has an appointment this week. - POCT CBC - BMP8+EGFR - Hepatic function panel  8. Dyslipidemia -Cholesterol numbers will  be checked today and treatment will be modified depending on results of lab work - POCT CBC - Lipid panel  9. Persistent atrial fibrillation -The heart attacks he appeared to be in a regular rate and rhythm today. - POCT INR; Standing  Patient Instructions                       Medicare Annual Wellness Visit  [place] and the medical providers at [facility] strive to bring you the best medical care.  In doing so we not only want to address your current medical conditions and concerns but also to detect new conditions early and prevent illness, disease and health-related problems.    Medicare offers a yearly Wellness Visit which allows our clinical staff to assess your need for preventative services including immunizations, lifestyle education, counseling to decrease risk of preventable diseases and  screening for fall risk and other medical concerns.    This visit is provided free of charge (no copay) for all Medicare recipients. The clinical pharmacists at [facility] have begun to conduct these Wellness Visits which will also include a thorough review of all your medications.    As you primary medical provider recommend that you make an appointment for your Annual Wellness Visit if you have not done so already this year.  You may set up this appointment before you leave today or you may call back ([phone]) and schedule an appointment.  Please make sure when you call that you mention that you are scheduling your Annual Wellness Visit with the clinical pharmacist so that the appointment may be made for the proper length of time.     Continue current medications. Continue good therapeutic lifestyle changes which include good diet and exercise. Fall precautions discussed with patient. If an FOBT was given today- please return it to our front desk. If you are over 42 years old - you may need Prevnar 53 or the adult Pneumonia vaccine.  Flu Shots are still available at our office. If you still haven't had one please call to set up a nurse visit to get one.   After your visit with [place] today you will receive a survey in the mail or online from [employer] regarding your care with [place]. Please take a moment to fill this out. Your feedback is very important to [place] as you can help [place] better understand your patient needs as well as improve your experience and satisfaction. WE CARE ABOUT YOU!!!    continue to be careful and did not put yourself at risk for falling  We will check with meals on wheels and see if you qualify  Continue to drink plenty of fluids  You have  An appointment scheduled with the heart doctor this Wednesday do not forget to keep   Arrie Senate MD

## 2015-04-15 NOTE — Patient Instructions (Addendum)
Medicare Annual Wellness Visit  [place] and the medical providers at [facility] strive to bring you the best medical care.  In doing so we not only want to address your current medical conditions and concerns but also to detect new conditions early and prevent illness, disease and health-related problems.    Medicare offers a yearly Wellness Visit which allows our clinical staff to assess your need for preventative services including immunizations, lifestyle education, counseling to decrease risk of preventable diseases and screening for fall risk and other medical concerns.    This visit is provided free of charge (no copay) for all Medicare recipients. The clinical pharmacists at [facility] have begun to conduct these Wellness Visits which will also include a thorough review of all your medications.    As you primary medical provider recommend that you make an appointment for your Annual Wellness Visit if you have not done so already this year.  You may set up this appointment before you leave today or you may call back [phone] and schedule an appointment.  Please make sure when you call that you mention that you are scheduling your Annual Wellness Visit with the clinical pharmacist so that the appointment may be made for the proper length of time.     Continue current medications. Continue good therapeutic lifestyle changes which include good diet and exercise. Fall precautions discussed with patient. If an FOBT was given today- please return it to our front desk. If you are over 79 years old - you may need Prevnar 13 or the adult Pneumonia vaccine.  Flu Shots are still available at our office. If you still haven't had one please call to set up a nurse visit to get one.   After your visit with us today you will receive a survey in the mail or online from [employer] regarding your care with us. Please take a moment to  fill this out. Your feedback is very important to us as you can help us better understand your patient needs as well as improve your experience and satisfaction. WE CARE ABOUT YOU!!!    continue to be careful and did not put yourself at risk for falling  We will check with meals on wheels and see if you qualify  Continue to drink plenty of fluids  You have  An appointment scheduled with the heart doctor this Wednesday do not forget to keep   Anticoagulation Dose Instructions as of 04/15/2015      Glynis SmilesSun Mon Tue Wed Thu Fri Sat   New Dose 2.5 mg 2.5 mg 5 mg 2.5 mg 5 mg 2.5 mg 2.5 mg    Description        Continue current warfarin dose of 1/2 tablet daily except 1 tablet on tuesdays and thursdays.      INR was 2.3 today

## 2015-04-15 NOTE — Addendum Note (Signed)
Addended by: Tommas OlpHANDY, Anntoinette Haefele N on: 04/15/2015 11:32 AM   Modules accepted: Orders

## 2015-04-16 LAB — BMP8+EGFR
BUN/Creatinine Ratio: 11 (ref 10–22)
BUN: 10 mg/dL (ref 8–27)
CO2: 31 mmol/L — ABNORMAL HIGH (ref 18–29)
CREATININE: 0.95 mg/dL (ref 0.76–1.27)
Calcium: 9.1 mg/dL (ref 8.6–10.2)
Chloride: 94 mmol/L — ABNORMAL LOW (ref 97–108)
GFR calc non Af Amer: 74 mL/min/{1.73_m2} (ref >59)
GFR, EST AFRICAN AMERICAN: 86 mL/min/{1.73_m2} (ref >59)
GLUCOSE: 101 mg/dL — AB (ref 65–99)
Potassium: 4.5 mmol/L (ref 3.5–5.2)
Sodium: 139 mmol/L (ref 134–144)

## 2015-04-16 LAB — VITAMIN D 25 HYDROXY (VIT D DEFICIENCY, FRACTURES): Vit D, 25-Hydroxy: 46.4 ng/mL (ref 30.0–100.0)

## 2015-04-16 LAB — HEPATIC FUNCTION PANEL
ALBUMIN: 4.2 g/dL (ref 3.5–4.7)
ALT: 15 IU/L (ref 0–44)
AST: 22 IU/L (ref 0–40)
Alkaline Phosphatase: 82 IU/L (ref 39–117)
BILIRUBIN, DIRECT: 0.33 mg/dL (ref 0.00–0.40)
Bilirubin Total: 1.1 mg/dL (ref 0.0–1.2)
Total Protein: 7 g/dL (ref 6.0–8.5)

## 2015-04-16 LAB — LIPID PANEL
CHOLESTEROL TOTAL: 115 mg/dL (ref 100–199)
Chol/HDL Ratio: 2.4 ratio units (ref 0.0–5.0)
HDL: 48 mg/dL (ref >39)
LDL CALC: 56 mg/dL (ref 0–99)
Triglycerides: 57 mg/dL (ref 0–149)
VLDL Cholesterol Cal: 11 mg/dL (ref 5–40)

## 2015-04-17 ENCOUNTER — Ambulatory Visit (INDEPENDENT_AMBULATORY_CARE_PROVIDER_SITE_OTHER): Payer: Medicare Other | Admitting: Cardiology

## 2015-04-17 ENCOUNTER — Encounter: Payer: Self-pay | Admitting: Cardiology

## 2015-04-17 VITALS — BP 120/78 | HR 84 | Ht 69.0 in | Wt 172.0 lb

## 2015-04-17 DIAGNOSIS — I481 Persistent atrial fibrillation: Secondary | ICD-10-CM

## 2015-04-17 DIAGNOSIS — I4819 Other persistent atrial fibrillation: Secondary | ICD-10-CM

## 2015-04-17 NOTE — Patient Instructions (Signed)
Medication Instructions:  Your physician recommends that you continue on your current medications as directed. Please refer to the Current Medication list given to you today.  Follow-Up: Follow up in 3 months with Dr Antoine [place]Hochrein in [place].  Thank you for choosing [facility]!!

## 2015-04-17 NOTE — Progress Notes (Signed)
HPI The patient returns for follow up of CAD.  Since I last saw him he has done OK.  He is not exercising as much because he has been caring for his wife still. The patient denies any new symptoms such as chest discomfort, neck or arm discomfort. There has been no new shortness of breath, PND or orthopnea. There have been no reported palpitations, presyncope or syncope.   Allergies  Allergen Reactions  . Asa-Apap-Salicyl-Caff     Takes warfarin  . Codeine Other (See Comments)    Unknown reaction  . Nsaids Other (See Comments)    On warfarin  . Sulfonamide Derivatives Nausea And Vomiting    Current Outpatient Prescriptions  Medication Sig Dispense Refill  . atorvastatin (LIPITOR) 40 MG tablet TAKE ONE TABLET AT BEDTIME 30 tablet 4  . Cholecalciferol (VITAMIN D) 1000 UNITS capsule Take 1 capsule (1,000 Units total) by mouth daily.    . [facility] B-12 1000 MCG TBCR TAKE 1 TABLET BY MOUTH EVERY DAY 60 tablet 2  . digoxin (LANOXIN) 0.125 MG tablet TAKE 1 TABLET IN THE MORNING 30 tablet 1  . metoprolol (LOPRESSOR) 100 MG tablet TAKE 1 & 1/2 TABLETS TWICE A DAY 90 tablet 0  . mirabegron ER (MYRBETRIQ) 25 MG TB24 tablet Take 1 tablet (25 mg total) by mouth daily. 30 tablet 3  . nitroGLYCERIN (NITROSTAT) 0.4 MG SL tablet Place 1 tablet (0.4 mg total) under the tongue every 5 (five) minutes as needed for chest pain. 25 tablet 1  . PARoxetine (PAXIL) 30 MG tablet TAKE 1 TABLET IN THE MORNING 30 tablet 0  . sulfaSALAzine (AZULFIDINE) 500 MG EC tablet 1 tablet (500 mg total) 2 (two) times daily. Take 500 mg twice a day by mouth for 1 week, then 1000 mg BID 60 tablet 0  . vitamin B-12 (CYANOCOBALAMIN) 1000 MCG tablet TAKE 1 TABLET IN THE MORNING 60 tablet 0  . warfarin (COUMADIN) 5 MG tablet TAKE AS DIRECTED PER CLINIC 1/2 MWF 1TTHSS 30 tablet 0   No current facility-administered medications for this visit.    Past Medical History  Diagnosis Date  . Dyslipidemia   . Anxiety   . GIB  (gastrointestinal bleeding)   . Obesity   . UC (ulcerative colitis confined to rectum)     Left-sided  . BPH (benign prostatic hyperplasia)   . Diverticulosis   . GERD (gastroesophageal reflux disease)   . Hemorrhoids   . Diabetes mellitus   . Tremor   . CAD (coronary artery disease)     Anterior MI and DES stenting 2004, Vfib arrest; left main 25% stenosis, LAD 80% stenosis, 99% stenosis, 25% circumflex stenosis, 25% ramus intermedius stenosis, 50-40% right coronary artery stenosis, 90% RV branch stenosis with right to left collaterals; EF 55% with mild naterior hypokinesis (underwent Taxus stenting at the time) . Stress perfusion study January 2012 EF 48% with old apical infarct  . Hyperlipidemia   . Hyperplastic colon polyp   . Myocardial infarction   . Hypertension   . Stroke   . Memory deficits 05/05/2013  . Essential and other specified forms of tremor 05/05/2013  . Hearing deficit     Wears hearing aids  . Cataract   . Atrial fibrillation, persistent     Past Surgical History  Procedure Laterality Date  . Hammer toe surgery      right  . Inguinal hernia repair      right, bilateral  . Coronary angioplasty with stent placement  Taxus stenting  . Cataract extraction w/phaco  10/27/2012    Procedure: CATARACT EXTRACTION PHACO AND INTRAOCULAR LENS PLACEMENT (IOC);  Surgeon: Gemma PayorKerry Hunt, MD;  Location: [facility];  Service: Ophthalmology;  Laterality: Left;  CDE 22.67    ROS:   Constipation and hard of hearing.  Otherwise as stated in the HPI and negative for all other systems.  PHYSICAL EXAM BP 120/78 mmHg  Pulse 84  Ht 5\' 9"  (1.753 m)  Wt 172 lb (78.019 kg)  BMI 25.39 kg/m2 GENERAL:  Well appearing NECK:  No jugular venous distention, waveform within normal limits, carotid upstroke brisk and symmetric, no bruits, no thyromegaly LUNGS:  Clear to auscultation bilaterally HEART:  PMI not displaced or sustained,S1 and S2 within normal limits, no S3, no clicks, no rubs, no  murmurs, irregular ABD:  Flat, positive bowel sounds normal in frequency in pitch, no bruits, no rebound, no guarding, no midline pulsatile mass, no hepatomegaly, no splenomegaly EXT:  2 plus pulses throughout, mild left ankle edema, no cyanosis no clubbing NEURO:  Cranial nerves II through XII grossly intact, motor grossly intact throughout, resting tremor severe  EKG:  Atrial flutter 84, axis within normal limits, intervals within normal limits, no acute ST-T wave changes.  04/17/2015   ASSESSMENT AND PLAN  CAD (coronary artery disease) -  The patient has no new sypmtoms.  No further cardiovascular testing is indicated.  We will continue with aggressive risk reduction and meds as listed.  His last stress test was in 2012.    DYSLIPIDEMIA -  This is followed closely by Dr. Christell ConstantMoore.  I will defer to his management.   FIBRILLATION, ATRIAL -  [name] has a CHA2DS2 - VASc score of 7 with a risk of stroke of 9.6%.   He tolerates warfarin.  He could not afford Xarelto.    He will continue with meds as listed. Because of the CVA last year and TIA in January he will remain on ASA.

## 2015-04-30 ENCOUNTER — Other Ambulatory Visit: Payer: Self-pay | Admitting: Family Medicine

## 2015-05-06 ENCOUNTER — Other Ambulatory Visit: Payer: Self-pay | Admitting: Family Medicine

## 2015-05-20 ENCOUNTER — Other Ambulatory Visit: Payer: Self-pay | Admitting: Family Medicine

## 2015-05-20 ENCOUNTER — Ambulatory Visit (INDEPENDENT_AMBULATORY_CARE_PROVIDER_SITE_OTHER): Payer: Medicare Other | Admitting: Pharmacist

## 2015-05-20 DIAGNOSIS — I4819 Other persistent atrial fibrillation: Secondary | ICD-10-CM

## 2015-05-20 DIAGNOSIS — I481 Persistent atrial fibrillation: Secondary | ICD-10-CM | POA: Diagnosis not present

## 2015-05-20 LAB — POCT INR: INR: 2.7

## 2015-05-20 NOTE — Patient Instructions (Signed)
Anticoagulation Dose Instructions as of 05/20/2015      [name] Tue Wed Thu Fri Sat   New Dose 2.5 mg 2.5 mg 5 mg 2.5 mg 5 mg 2.5 mg 2.5 mg    Description        Continue current warfarin dose of 1/2 tablet daily except 1 tablet on tuesdays and thursdays.      INR was 2.7 today

## 2015-05-22 ENCOUNTER — Other Ambulatory Visit: Payer: Self-pay | Admitting: Pharmacist

## 2015-05-22 MED ORDER — MIRABEGRON ER 25 MG PO TB24: 25.0000 mg | ORAL_TABLET | Freq: Every day | ORAL | Status: DC

## 2015-06-03 ENCOUNTER — Other Ambulatory Visit: Payer: Self-pay | Admitting: Family Medicine

## 2015-06-10 ENCOUNTER — Other Ambulatory Visit: Payer: Self-pay | Admitting: Family Medicine

## 2015-06-13 ENCOUNTER — Encounter: Payer: Self-pay | Admitting: *Deleted

## 2015-06-17 ENCOUNTER — Other Ambulatory Visit: Payer: Self-pay | Admitting: Family Medicine

## 2015-06-28 ENCOUNTER — Encounter: Payer: Self-pay | Admitting: Pharmacist

## 2015-06-28 ENCOUNTER — Ambulatory Visit (INDEPENDENT_AMBULATORY_CARE_PROVIDER_SITE_OTHER): Payer: Medicare Other | Admitting: Pharmacist

## 2015-06-28 VITALS — BP 118/68 | HR 68 | Ht 67.5 in | Wt 164.0 lb

## 2015-06-28 DIAGNOSIS — Z79899 Other long term (current) drug therapy: Secondary | ICD-10-CM

## 2015-06-28 DIAGNOSIS — E119 Type 2 diabetes mellitus without complications: Secondary | ICD-10-CM | POA: Insufficient documentation

## 2015-06-28 DIAGNOSIS — I481 Persistent atrial fibrillation: Secondary | ICD-10-CM | POA: Diagnosis not present

## 2015-06-28 DIAGNOSIS — Z0000 Encounter for general adult medical examination without abnormal findings: Secondary | ICD-10-CM

## 2015-06-28 DIAGNOSIS — Z1211 Encounter for screening for malignant neoplasm of colon: Secondary | ICD-10-CM

## 2015-06-28 DIAGNOSIS — I4819 Other persistent atrial fibrillation: Secondary | ICD-10-CM

## 2015-06-28 LAB — POCT INR: INR: 2.9

## 2015-06-28 NOTE — Patient Instructions (Signed)
[name] , Thank you for taking time to come for your Medicare Wellness Visit. I appreciate your ongoing commitment to your health goals. Please review the following plan we discussed and let me know if I can assist you in the future.    This is a list of the screening recommended for you and due dates:  Health Maintenance  Topic Date Due  . Flu Shot  06/24/2015  . Colon Cancer Screening  12/05/2017*  . Urine Protein Check  07/24/2015  . Hemoglobin A1C  10/16/2015  . Eye exam for diabetics  10/24/2015  . Complete foot exam   04/14/2016  . Tetanus Vaccine  12/24/2017  . Shingles Vaccine  Completed  . Pneumonia vaccines  Completed  *Topic was postponed. The date shown is not the original due date.     Health Maintenance A healthy lifestyle and preventative care can promote health and wellness.  Maintain regular health, dental, and eye exams.  Eat a healthy diet. Foods like vegetables, fruits, whole grains, low-fat dairy products, and lean protein foods contain the nutrients you need and are low in calories. Decrease your intake of foods high in solid fats, added sugars, and salt. Get information about a proper diet from your health care provider, if necessary.  Regular physical exercise is one of the most important things you can do for your health. Most adults should get at least 150 minutes of moderate-intensity exercise (any activity that increases your heart rate and causes you to sweat) each week. In addition, most adults need muscle-strengthening exercises on 2 or more days a week.   Maintain a healthy weight. The body mass index (BMI) is a screening tool to identify possible weight problems. It provides an estimate of body fat based on height and weight. Your health care provider can find your BMI and can help you achieve or maintain a healthy weight. For males 20 years and older:  A BMI below 18.5 is considered underweight.  A BMI of 18.5 to 24.9 is normal.  A BMI of 25  to 29.9 is considered overweight.  A BMI of 30 and above is considered obese.  Maintain normal blood lipids and cholesterol by exercising and minimizing your intake of saturated fat. Eat a balanced diet with plenty of fruits and vegetables. Blood tests for lipids and cholesterol should begin at age 42 and be repeated every 5 years. If your lipid or cholesterol levels are high, you are over age 52, or you are at high risk for heart disease, you may need your cholesterol levels checked more frequently.Ongoing high lipid and cholesterol levels should be treated with medicines if diet and exercise are not working.  If you smoke, find out from your health care provider how to quit. If you do not use tobacco, do not start.  Lung cancer screening is recommended for adults aged 55-80 years who are at high risk for developing lung cancer because of a history of smoking. A yearly low-dose CT scan of the lungs is recommended for people who have at least a 30-pack-year history of smoking and are current smokers or have quit within the past 15 years. A pack year of smoking is smoking an average of 1 pack of cigarettes a day for 1 year (for example, a 30-pack-year history of smoking could mean smoking 1 pack a day for 30 years or 2 packs a day for 15 years). Yearly screening should continue until the smoker has stopped smoking for at least 15 years.  Yearly screening should be stopped for people who develop a health problem that would prevent them from having lung cancer treatment.  If you choose to drink alcohol, do not have more than 2 drinks per day. One drink is considered to be 12 oz (360 mL) of beer, 5 oz (150 mL) of wine, or 1.5 oz (45 mL) of liquor.  Avoid the use of street drugs. Do not share needles with anyone. Ask for help if you need support or instructions about stopping the use of drugs.  High blood pressure causes heart disease and increases the risk of stroke. Blood pressure should be checked at  least every 1-2 years. Ongoing high blood pressure should be treated with medicines if weight loss and exercise are not effective.  If you are 31-9 years old, ask your health care provider if you should take aspirin to prevent heart disease.  Diabetes screening involves taking a blood sample to check your fasting blood sugar level. This should be done once every 3 years after age 63 if you are at a normal weight and without risk factors for diabetes. Testing should be considered at a younger age or be carried out more frequently if you are overweight and have at least 1 risk factor for diabetes.  Colorectal cancer can be detected and often prevented. Most routine colorectal cancer screening begins at the age of 62 and continues through age 81. However, your health care provider may recommend screening at an earlier age if you have risk factors for colon cancer. On a yearly basis, your health care provider may provide home test kits to check for hidden blood in the stool. A small camera at the end of a tube may be used to directly examine the colon (sigmoidoscopy or colonoscopy) to detect the earliest forms of colorectal cancer. Talk to your health care provider about this at age 37 when routine screening begins. A direct exam of the colon should be repeated every 5-10 years through age 50, unless early forms of precancerous polyps or small growths are found.  People who are at an increased risk for hepatitis B should be screened for this virus. You are considered at high risk for hepatitis B if:  You were born in a country where hepatitis B occurs often. Talk with your health care provider about which countries are considered high risk.  Your parents were born in a high-risk country and you have not received a shot to protect against hepatitis B (hepatitis B vaccine).  You have HIV or AIDS.  You use needles to inject street drugs.  You live with, or have sex with, someone who has hepatitis  B.  You are a man who has sex with other men (MSM).  You get hemodialysis treatment.  You take certain medicines for conditions like cancer, organ transplantation, and autoimmune conditions.  Hepatitis C blood testing is recommended for all people born from [birth year] through 1963 and any individual with known risk factors for hepatitis C.  Healthy men should no longer receive prostate-specific antigen (PSA) blood tests as part of routine cancer screening. Talk to your health care provider about prostate cancer screening.  Testicular cancer screening is not recommended for adolescents or adult males who have no symptoms. Screening includes self-exam, a health care provider exam, and other screening tests. Consult with your health care provider about any symptoms you have or any concerns you have about testicular cancer.  Practice safe sex. Use condoms and avoid high-risk sexual practices to  reduce the spread of sexually transmitted infections (STIs).  You should be screened for STIs, including gonorrhea and chlamydia if:  You are sexually active and are younger than 24 years.  You are older than 24 years, and your health care provider tells you that you are at risk for this type of infection.  Your sexual activity has changed since you were last screened, and you are at an increased risk for chlamydia or gonorrhea. Ask your health care provider if you are at risk.  If you are at risk of being infected with HIV, it is recommended that you take a prescription medicine daily to prevent HIV infection. This is called pre-exposure prophylaxis (PrEP). You are considered at risk if:  You are a man who has sex with other men (MSM).  You are a heterosexual man who is sexually active with multiple partners.  You take drugs by injection.  You are sexually active with a partner who has HIV.  Talk with your health care provider about whether you are at high risk of being infected with HIV. If you  choose to begin PrEP, you should first be tested for HIV. You should then be tested every 3 months for as long as you are taking PrEP.  Use sunscreen. Apply sunscreen liberally and repeatedly throughout the day. You should seek shade when your shadow is shorter than you. Protect yourself by wearing long sleeves, pants, a wide-brimmed hat, and sunglasses year round whenever you are outdoors.  Tell your health care provider of new moles or changes in moles, especially if there is a change in shape or color. Also, tell your health care provider if a mole is larger than the size of a pencil eraser.  A one-time screening for abdominal aortic aneurysm (AAA) and surgical repair of large AAAs by ultrasound is recommended for men aged 65-75 years who are current or former smokers.  Stay current with your vaccines (immunizations). Document Released: 05/07/2008 Document Revised: 11/14/2013 Document Reviewed: 04/06/2011 [facility] Patient Information 2015 [place], [place]. This information is not intended to replace advice given to you by your health care provider. Make sure you discuss any questions you have with your health care provider.

## 2015-06-28 NOTE — Progress Notes (Signed)
Patient ID: [name], male   DOB: 10-09-1930, 82 y.o.   MRN: [id number]    Subjective:   [name] is a 81 y.o. male who presents for a Subsequent Medicare Annual Wellness Visit and recheck protime.   [name] is a WM with a history of CVA, atrial fibrillation and essential tremor.  He lives with his wife at home but they have daily assistance with housecleaning and meal prep.  They also receive their medications in prepackaged medication card to helps with daily administration. Patient no longer drives and his pastor most often will bring him and his wife to medical appointments and for shopping.    Current Medications (verified) Outpatient Encounter Prescriptions as of 06/28/2015  Medication Sig  . atorvastatin (LIPITOR) 40 MG tablet TAKE ONE TABLET AT BEDTIME  . Cholecalciferol (VITAMIN D) 1000 UNITS capsule Take 1 capsule (1,000 Units total) by mouth daily.  . CVS B-12 1000 MCG TBCR TAKE 1 TABLET BY MOUTH EVERY DAY  . digoxin (LANOXIN) 0.125 MG tablet TAKE 1 TABLET IN THE MORNING  . metoprolol (LOPRESSOR) 100 MG tablet TAKE 1 & 1/2 TABLETS TWICE A DAY  . mirabegron ER (MYRBETRIQ) 25 MG TB24 tablet Take 1 tablet (25 mg total) by mouth daily.  . nitroGLYCERIN (NITROSTAT) 0.4 MG SL tablet Place 1 tablet (0.4 mg total) under the tongue every 5 (five) minutes as needed for chest pain.  Marland Kitchen PARoxetine (PAXIL) 30 MG tablet TAKE 1 TABLET IN THE MORNING  . sulfaSALAzine (AZULFIDINE) 500 MG EC tablet 1 tablet (500 mg total) 2 (two) times daily. Take 500 mg twice a day by mouth for 1 week, then 1000 mg BID  . vitamin B-12 (CYANOCOBALAMIN) 1000 MCG tablet TAKE 1 TABLET IN THE MORNING  . warfarin (COUMADIN) 5 MG tablet TAKE AS DIRECTED PER CLINIC 1/2 MWF 1TTHSS   No facility-administered encounter medications on file as of 06/28/2015.    Allergies (verified) Asa-apap-salicyl-caff; Codeine; Nsaids; and Sulfonamide derivatives   History: Past Medical History  Diagnosis Date  . Dyslipidemia    . Anxiety   . GIB (gastrointestinal bleeding)   . Obesity   . UC (ulcerative colitis confined to rectum)     Left-sided  . BPH (benign prostatic hyperplasia)   . Diverticulosis   . GERD (gastroesophageal reflux disease)   . Hemorrhoids   . Diabetes mellitus   . Tremor   . CAD (coronary artery disease)     Anterior MI and DES stenting 2004, Vfib arrest; left main 25% stenosis, LAD 80% stenosis, 99% stenosis, 25% circumflex stenosis, 25% ramus intermedius stenosis, 50-40% right coronary artery stenosis, 90% RV branch stenosis with right to left collaterals; EF 55% with mild naterior hypokinesis (underwent Taxus stenting at the time) . Stress perfusion study January 2012 EF 48% with old apical infarct  . Hyperlipidemia   . Hyperplastic colon polyp   . Myocardial infarction   . Hypertension   . Stroke   . Memory deficits 05/05/2013  . Essential and other specified forms of tremor 05/05/2013  . Hearing deficit     Wears hearing aids  . Cataract   . Atrial fibrillation, persistent    Past Surgical History  Procedure Laterality Date  . Hammer toe surgery      right  . Inguinal hernia repair      right, bilateral  . Coronary angioplasty with stent placement      Taxus stenting  . Cataract extraction w/phaco  10/27/2012    Procedure: CATARACT  EXTRACTION PHACO AND INTRAOCULAR LENS PLACEMENT (IOC);  Surgeon: Gemma Payor, MD;  Location: [facility];  Service: Ophthalmology;  Laterality: Left;  CDE 22.67   Family History  Problem Relation Age of Onset  . Colon cancer Neg Hx   . Alzheimer's disease Brother   . Diabetes Brother     x 4  . Diabetes Mother     ?  . Diabetes Father   . Heart disease Father   . Heart disease Paternal Grandfather   . Heart disease Brother     x 3  . COPD Sister   . Diabetes Sister    Social History   Occupational History  . Retired    Social History Main Topics  . Smoking status: Former Smoker    Quit date: 11/23/1958  . Smokeless tobacco: Former  Neurosurgeon    Types: Chew     Comment: Quit years ago  . Alcohol Use: No  . Drug Use: No  . Sexual Activity: No   Do you feel safe at home?  Yes  Dietary issues and exercise activities discussed: Current Exercise Habits:: The patient does not participate in regular exercise at present  Current Dietary habits:  Not following any particular diet at this time.     Cardiac Risk Factors include: advanced age (>22men, >48 women);dyslipidemia;hypertension;male gender;sedentary lifestyle  Objective:    Today's Vitals   06/28/15 1116  BP: 118/68  Pulse: 68  Height: 5' 7.5" (1.715 m)  Weight: 164 lb (74.39 kg)  PainSc: 2   PainLoc: Back   Body mass index is 25.29 kg/(m^2).   INR was 2.9 in office today   Activities of Daily Living In your present state of health, do you have any difficulty performing the following activities: 06/28/2015  Hearing? Y  Vision? N  Difficulty concentrating or making decisions? Y  Walking or climbing stairs? N  Dressing or bathing? N  Doing errands, shopping? Y  Preparing Food and eating ? Y  Using the Toilet? N  In the past six months, have you accidently leaked urine? N  Do you have problems with loss of bowel control? N  Managing your Medications? Y  Managing your Finances? N  Housekeeping or managing your Housekeeping? Y    Are there smokers in your home (other than you)? No    Depression Screen PHQ 2/9 Scores 06/28/2015 04/15/2015 12/05/2014 06/21/2014  PHQ - 2 Score 0 0 0 0    Fall Risk Fall Risk  06/28/2015 04/15/2015 12/05/2014 06/21/2014 06/22/2013  Falls in the past year? No Yes No Yes No  Number falls in past yr: - 1 - 1 -  Injury with Fall? - No - No -  Risk for fall due to : - - - Impaired balance/gait;Impaired mobility -    Cognitive Function: MMSE - Mini Mental State Exam 06/28/2015  Not completed: Unable to complete  Orientation to time 3  Orientation to Place 5  Registration 3  Attention/ Calculation 1  Recall 3  Language- name 2  objects 2  Language- repeat 1  Language- follow 3 step command 3  Language- read & follow direction 0  Write a sentence 0  Copy design 0  Total score 21  Patient did fairly well with recall portion of MMSE however due to his tremor he was unable to complete writing and design portion of testing.   Immunizations and Health Maintenance Immunization History  Administered Date(s) Administered  . Influenza Whole 09/23/2010  . Influenza,inj,Quad PF,36+ Mos  09/05/2014  . Pneumococcal Conjugate-13 06/21/2014  . Pneumococcal Polysaccharide-23 01/21/2009  . Td 12/25/2007   Health Maintenance Due  Topic Date Due  . INFLUENZA VACCINE  06/24/2015    Patient Care Team: [name], MD as PCP - General (Family Medicine) Meryl Dare, MD (Gastroenterology) York Spaniel, MD (Neurology) [name], MD (Cardiology)  Indicate any recent Medical Services you may have received from other than [facility] providers in the past year (date may be approximate).    Assessment:    Annual Wellness Visit  Therapeutic anticoagulation Type 2 DM - diet controlled Medication management  Screening Tests Health Maintenance  Topic Date Due  . INFLUENZA VACCINE  06/24/2015  . COLONOSCOPY  12/05/2017 (Originally 07/13/2013)  . URINE MICROALBUMIN  07/24/2015  . HEMOGLOBIN A1C  10/16/2015  . OPHTHALMOLOGY EXAM  10/24/2015  . FOOT EXAM  04/14/2016  . TETANUS/TDAP  12/24/2017  . ZOSTAVAX  Completed  . PNA vac Low Risk Adult  Completed        Plan:   During the course of the visit [name] was educated and counseled about the following appropriate screening and preventive services:   Vaccines to include Pneumoccal, Influenza, Hepatitis B, Td, Zostavax - all vaccines UTD  Colorectal cancer screening - colonoscopy UTD; FOBT given in office today for patient to return  Cardiovascular disease screening - next appt with Dr Kirtland Bouchard is in 1 month.  EKG UTD;  Patient is taking statin and BP is at goal.     Diabetes - ACEI is recommended but currently patient BP is 118/68.  Urine microalbimin has been negative and DM is diet controlled.  Therefore will not add ACE I at this time.  Diabetic Eye Exam - completed 10/2014 0 copy of notes requested from Dr Ermalene Searing office.  Nutrition counseling - patient's weight is stable and DM, BP under good control.  Continue current diet  Advanced Directives UTD  Increase physical activity - gave handout and discussed chair exercises.   Orders Placed This Encounter  Procedures  . Digoxin level    Anticoagulation Dose Instructions as of 06/28/2015      Glynis Smiles Tue Wed Thu Fri Sat   New Dose 2.5 mg 2.5 mg 5 mg 2.5 mg 5 mg 2.5 mg 2.5 mg    Description        Continue current warfarin dose of 1/2 tablet daily except 1 tablet on tuesdays and thursdays.      RTC in 5 weeks to recheck INR  Patient Instructions (the written plan) were given to the patient.   Henrene Pastor, PHARMD , CPP, CDE  06/28/2015

## 2015-06-29 LAB — DIGOXIN LEVEL: Digoxin, Serum: 1.2 ng/mL (ref 0.9–2.0)

## 2015-07-04 ENCOUNTER — Encounter: Payer: Self-pay | Admitting: Gastroenterology

## 2015-07-23 ENCOUNTER — Telehealth: Payer: Self-pay | Admitting: Cardiology

## 2015-07-23 ENCOUNTER — Other Ambulatory Visit: Payer: Self-pay | Admitting: Family Medicine

## 2015-07-23 NOTE — Telephone Encounter (Signed)
New message    Request for surgical clearance:  1. What type of surgery is being performed? Extractions  2. When is this surgery scheduled? Not scheduled at this time  3. Are there any medications that need to be held prior to surgery and how long?Warfarin and not sure how long   4. Name of physician performing surgery? Dr Melvyn Neth   5. What is your office phone and fax number? [facility] [phone]   Fax [phone]  Please fax clearance with instructions to office

## 2015-07-23 NOTE — Telephone Encounter (Signed)
The patient would be at acceptable risk for extractions.  However, he would be higher risk to come off of warfarin.  I don't know if the extractions can happen while still on blood thinners.

## 2015-07-24 NOTE — Telephone Encounter (Signed)
Spoke with Dr Melvyn Neth - he is aware of Dr Hochrein's approval to have extractions and that the pt is at higher risk to hold Warfarin d/t At Fib.  Dentist states he mainly wanted to Dr Antoine Poche to be aware of the procedure and to see if he had any recommendations.  Dr Melvyn Neth stated he will leave him on the Warfarin to do the extractions.

## 2015-07-30 ENCOUNTER — Other Ambulatory Visit: Payer: Self-pay | Admitting: Family Medicine

## 2015-07-31 ENCOUNTER — Ambulatory Visit (INDEPENDENT_AMBULATORY_CARE_PROVIDER_SITE_OTHER): Payer: Medicare Other | Admitting: Cardiology

## 2015-07-31 ENCOUNTER — Encounter: Payer: Self-pay | Admitting: Cardiology

## 2015-07-31 VITALS — BP 118/82 | HR 130 | Ht 69.0 in | Wt 158.0 lb

## 2015-07-31 DIAGNOSIS — I4819 Other persistent atrial fibrillation: Secondary | ICD-10-CM

## 2015-07-31 DIAGNOSIS — I481 Persistent atrial fibrillation: Secondary | ICD-10-CM

## 2015-07-31 DIAGNOSIS — I2510 Atherosclerotic heart disease of native coronary artery without angina pectoris: Secondary | ICD-10-CM | POA: Diagnosis not present

## 2015-07-31 NOTE — Patient Instructions (Signed)
Medication Instructions:  The current medical regimen is effective;  continue present plan and medications.  Follow-Up: Follow up as needed.  Thank you for choosing [facility]!!     

## 2015-07-31 NOTE — Progress Notes (Signed)
HPI The patient returns for follow up of CAD.  Since I last saw him he has done OK.  He is still caring for his wife who is chronically ill. The patient denies any new symptoms such as chest discomfort, neck or arm discomfort. There has been no new shortness of breath, PND or orthopnea. There have been no reported palpitations, presyncope or syncope. He has lost about six pounds.  He thinks that this because he doesn't eat as well as he used to.    Allergies  Allergen Reactions  . Asa-Apap-Salicyl-Caff     Takes warfarin  . Codeine Other (See Comments)    Unknown reaction  . Nsaids Other (See Comments)    On warfarin  . Sulfonamide Derivatives Nausea And Vomiting    Current Outpatient Prescriptions  Medication Sig Dispense Refill  . atorvastatin (LIPITOR) 40 MG tablet TAKE ONE TABLET AT BEDTIME 30 tablet 3  . Cholecalciferol (VITAMIN D) 1000 UNITS capsule Take 1 capsule (1,000 Units total) by mouth daily.    . digoxin (LANOXIN) 0.125 MG tablet TAKE 1 TABLET IN THE MORNING 30 tablet 1  . metoprolol (LOPRESSOR) 100 MG tablet TAKE 1 & 1/2 TABLETS TWICE A DAY 90 tablet 0  . mirabegron ER (MYRBETRIQ) 25 MG TB24 tablet Take 1 tablet (25 mg total) by mouth daily. 28 tablet 0  . nitroGLYCERIN (NITROSTAT) 0.4 MG SL tablet Place 1 tablet (0.4 mg total) under the tongue every 5 (five) minutes as needed for chest pain. 25 tablet 1  . PARoxetine (PAXIL) 30 MG tablet TAKE 1 TABLET IN THE MORNING 30 tablet 2  . sulfaSALAzine (AZULFIDINE) 500 MG EC tablet 1 tablet (500 mg total) 2 (two) times daily. Take 500 mg twice a day by mouth for 1 week, then 1000 mg BID 60 tablet 0  . vitamin B-12 (CYANOCOBALAMIN) 1000 MCG tablet TAKE 1 TABLET IN THE MORNING 30 tablet 1  . warfarin (COUMADIN) 5 MG tablet Takes as directed 25 tablet 1   No current facility-administered medications for this visit.    Past Medical History  Diagnosis Date  . Dyslipidemia   . Anxiety   . GIB (gastrointestinal bleeding)   .  Obesity   . UC (ulcerative colitis confined to rectum)     Left-sided  . BPH (benign prostatic hyperplasia)   . Diverticulosis   . GERD (gastroesophageal reflux disease)   . Hemorrhoids   . Diabetes mellitus   . Tremor   . CAD (coronary artery disease)     Anterior MI and DES stenting 2004, Vfib arrest; left main 25% stenosis, LAD 80% stenosis, 99% stenosis, 25% circumflex stenosis, 25% ramus intermedius stenosis, 50-40% right coronary artery stenosis, 90% RV branch stenosis with right to left collaterals; EF 55% with mild naterior hypokinesis (underwent Taxus stenting at the time) . Stress perfusion study January 2012 EF 48% with old apical infarct  . Hyperlipidemia   . Hyperplastic colon polyp   . Myocardial infarction   . Hypertension   . Stroke   . Memory deficits 05/05/2013  . Essential and other specified forms of tremor 05/05/2013  . Hearing deficit     Wears hearing aids  . Cataract   . Atrial fibrillation, persistent     Past Surgical History  Procedure Laterality Date  . Hammer toe surgery      right  . Inguinal hernia repair      right, bilateral  . Coronary angioplasty with stent placement  Taxus stenting  . Cataract extraction w/phaco  10/27/2012    Procedure: CATARACT EXTRACTION PHACO AND INTRAOCULAR LENS PLACEMENT (IOC);  Surgeon: Gemma Payor, MD;  Location: [facility];  Service: Ophthalmology;  Laterality: Left;  CDE 22.67    ROS:   Constipation and hard of hearing.  Otherwise as stated in the HPI and negative for all other systems.  PHYSICAL EXAM BP 118/82 mmHg  Pulse 130  Ht 5\' 9"  (1.753 m)  Wt 158 lb (71.668 kg)  BMI 23.32 kg/m2 GENERAL:  Well appearing NECK:  No jugular venous distention, waveform within normal limits, carotid upstroke brisk and symmetric, no bruits, no thyromegaly LUNGS:  Clear to auscultation bilaterally HEART:  PMI not displaced or sustained,S1 and S2 within normal limits, no S3, no clicks, no rubs, no murmurs, irregular ABD:  Flat,  positive bowel sounds normal in frequency in pitch, no bruits, no rebound, no guarding, no midline pulsatile mass, no hepatomegaly, no splenomegaly EXT:  2 plus pulses throughout, mild left ankle edema, no cyanosis no clubbing NEURO:  Cranial nerves II through XII grossly intact, motor grossly intact throughout, resting tremor severe  EKG:  Regular rhythm, rate 100, unable to assess rhythm because of baseline artifact related tremor although it appears to be fibrillation, no obvious acute ST-T wave changes..  07/31/2015   ASSESSMENT AND PLAN  CAD (coronary artery disease) -  The patient has no new sypmtoms.  No further cardiovascular testing is indicated.  We will continue with aggressive risk reduction and meds as listed.  His last stress test was in 2012.     FIBRILLATION, ATRIAL -  [name] has a CHA2DS2 - VASc score of 7 with a risk of stroke of 9.6%.   He tolerates warfarin.  He will continue with meds as listed. Because of the CVA last year and TIA in January he will remain on ASA.

## 2015-08-03 ENCOUNTER — Other Ambulatory Visit: Payer: Self-pay | Admitting: Family Medicine

## 2015-08-07 ENCOUNTER — Other Ambulatory Visit: Payer: Self-pay | Admitting: Pharmacist

## 2015-08-07 ENCOUNTER — Ambulatory Visit (INDEPENDENT_AMBULATORY_CARE_PROVIDER_SITE_OTHER): Payer: Medicare Other | Admitting: Pharmacist

## 2015-08-07 DIAGNOSIS — I481 Persistent atrial fibrillation: Secondary | ICD-10-CM

## 2015-08-07 DIAGNOSIS — I4819 Other persistent atrial fibrillation: Secondary | ICD-10-CM

## 2015-08-07 LAB — POCT INR: INR: 2.4

## 2015-08-07 MED ORDER — MIRABEGRON ER 25 MG PO TB24: 25.0000 mg | ORAL_TABLET | Freq: Every day | ORAL | Status: DC

## 2015-08-07 NOTE — Patient Instructions (Signed)
Anticoagulation Dose Instructions as of 08/07/2015      [name] Tue Wed Thu Fri Sat   New Dose 2.5 mg 2.5 mg 5 mg 2.5 mg 5 mg 2.5 mg 2.5 mg    Description        Continue current warfarin dose of 1/2 tablet daily except 1 tablet on tuesdays and thursdays.      INR was 2.4 today

## 2015-08-17 ENCOUNTER — Other Ambulatory Visit: Payer: Self-pay | Admitting: Family Medicine

## 2015-08-19 ENCOUNTER — Encounter: Payer: Self-pay | Admitting: *Deleted

## 2015-08-19 ENCOUNTER — Telehealth: Payer: Self-pay | Admitting: Cardiology

## 2015-08-19 NOTE — Telephone Encounter (Signed)
Patient calling - dentist office requests him off coumadin for extraction.  Routing to Dr. Antoine Poche, Belenda Cruise to advise. Does this need to go through [facility]? He gets coumadin checks there.

## 2015-08-19 NOTE — Telephone Encounter (Signed)
Patient has CHADS2-VASC score of 7.  Because of history of stroke, would recommend to continue warfarin during dental extraction.  If held for 3 days would have to bridge with lovenox.  Can have [facility] adjust dose to keep him at low end of therapeutic for procedure.

## 2015-08-19 NOTE — Telephone Encounter (Signed)
Advised dental office OK/preferred to do extraction w/o interruption to coumadin therapy. They requested note.  Will write up later and provide.  Pt/family have been contacted, aware of plan.

## 2015-08-19 NOTE — Telephone Encounter (Signed)
Pt needs to have a tooth extracted. His dentist wants him to stop his Coumadin for 3 days prior to the extraction Please fax a note to [phone] Dr Rise Paganini.

## 2015-08-19 NOTE — Telephone Encounter (Signed)
Letter faxed to [facility] office

## 2015-08-28 ENCOUNTER — Encounter: Payer: Self-pay | Admitting: Family Medicine

## 2015-08-28 ENCOUNTER — Ambulatory Visit (INDEPENDENT_AMBULATORY_CARE_PROVIDER_SITE_OTHER): Payer: Medicare Other | Admitting: Family Medicine

## 2015-08-28 VITALS — BP 131/86 | HR 87 | Temp 97.0°F | Ht 69.0 in | Wt 155.0 lb

## 2015-08-28 DIAGNOSIS — R413 Other amnesia: Secondary | ICD-10-CM

## 2015-08-28 DIAGNOSIS — N400 Enlarged prostate without lower urinary tract symptoms: Secondary | ICD-10-CM | POA: Diagnosis not present

## 2015-08-28 DIAGNOSIS — E559 Vitamin D deficiency, unspecified: Secondary | ICD-10-CM | POA: Diagnosis not present

## 2015-08-28 DIAGNOSIS — E119 Type 2 diabetes mellitus without complications: Secondary | ICD-10-CM | POA: Diagnosis not present

## 2015-08-28 DIAGNOSIS — I481 Persistent atrial fibrillation: Secondary | ICD-10-CM | POA: Diagnosis not present

## 2015-08-28 DIAGNOSIS — I739 Peripheral vascular disease, unspecified: Secondary | ICD-10-CM

## 2015-08-28 DIAGNOSIS — K219 Gastro-esophageal reflux disease without esophagitis: Secondary | ICD-10-CM | POA: Diagnosis not present

## 2015-08-28 DIAGNOSIS — E785 Hyperlipidemia, unspecified: Secondary | ICD-10-CM | POA: Diagnosis not present

## 2015-08-28 DIAGNOSIS — Z23 Encounter for immunization: Secondary | ICD-10-CM | POA: Diagnosis not present

## 2015-08-28 DIAGNOSIS — I4819 Other persistent atrial fibrillation: Secondary | ICD-10-CM

## 2015-08-28 LAB — POCT INR: INR: 3.1

## 2015-08-28 LAB — POCT GLYCOSYLATED HEMOGLOBIN (HGB A1C): Hemoglobin A1C: 5.7

## 2015-08-28 MED ORDER — ADULT MULTIVITAMIN W/MINERALS CH: 1.0000 | ORAL_TABLET | Freq: Every day | ORAL | Status: DC

## 2015-08-28 NOTE — Patient Instructions (Addendum)
Medicare Annual Wellness Visit  [place] and the medical providers at [facility] strive to bring you the best medical care.  In doing so we not only want to address your current medical conditions and concerns but also to detect new conditions early and prevent illness, disease and health-related problems.    Medicare offers a yearly Wellness Visit which allows our clinical staff to assess your need for preventative services including immunizations, lifestyle education, counseling to decrease risk of preventable diseases and screening for fall risk and other medical concerns.    This visit is provided free of charge (no copay) for all Medicare recipients. The clinical pharmacists at [facility] have begun to conduct these Wellness Visits which will also include a thorough review of all your medications.    As you primary medical provider recommend that you make an appointment for your Annual Wellness Visit if you have not done so already this year.  You may set up this appointment before you leave today or you may call back ([phone]) and schedule an appointment.  Please make sure when you call that you mention that you are scheduling your Annual Wellness Visit with the clinical pharmacist so that the appointment may be made for the proper length of time.     Continue current medications. Continue good therapeutic lifestyle changes which include good diet and exercise. Fall precautions discussed with patient. If an FOBT was given today- please return it to our front desk. If you are over 68 years old - you may need Prevnar 13 or the adult Pneumonia vaccine.  **Flu shots will be available soon--- please call and schedule a FLU-CLINIC appointment**  After your visit with [place] today you will receive a survey in the mail or online from [employer] regarding your care with [place]. Please take a moment to fill this out. Your feedback is  very important to [place] as you can help [place] better understand your patient needs as well as improve your experience and satisfaction. WE CARE ABOUT YOU!!!    Anticoagulation Dose Instructions as of 08/28/2015      Glynis Smiles Tue Wed Thu Fri Sat   New Dose 2.5 mg 2.5 mg 5 mg 2.5 mg 5 mg 2.5 mg 2.5 mg    Description        No warfarin for 1 days (will notify pharmacy) Then continue current warfarin dose of 1/2 tablet daily except 1 tablet on tuesdays and thursdays.      INR was 3.1 today  We will have the pharmacy give the patient a good multivitamin to take 1 daily

## 2015-08-28 NOTE — Progress Notes (Signed)
Subjective:    Patient ID: [name], male    DOB: 05-23-1930, 81 y.o.   MRN: [id number]  HPI Pt here for follow up and management of chronic medical problems which includes atrial fibrillation, diabetes, and ASCVD. He is taking medications regularly. He is accompanied today by his wife and they were drove here by someone from their church. The patient recently saw the cardiologist in early September because of the patient's history of heart disease and persistent atrial fibrillation. This visit was reviewed today. The biggest concern today is his weight loss and according to the note with the cardiologist the patient told him that he feels like is because he is not eating as well as he used to. We will get lab work including thyroid studies today to make sure there is no specific abnormalities that could be contributing to the weight loss. We also will want to repeat his FOBT given that he did one back in August. He will get his flu shot today. The patient denies any chest pain and has occasional shortness of breath but is unassociated with any activity. He has no trouble swallowing his food heartburn indigestion and nausea vomiting or diarrhea. He goes to the bathroom to pass his water a lot but it is not painful. He is definitely hard of hearing and he has his parkinsonian tremor.     Patient Active Problem List   Diagnosis Date Noted  . Type 2 diabetes, diet controlled ([place]) 06/28/2015  . Cerebrovascular disease 12/05/2014  . ASCVD (arteriosclerotic cardiovascular disease) 12/05/2014  . Persistent atrial fibrillation ([place]) 12/05/2014  . High risk medication use 06/22/2013  . Hypokalemia 06/22/2013  . Memory deficits 05/05/2013  . Essential and other specified forms of tremor 05/05/2013  . Cerebrovascular disease, unspecified 01/16/2013  . CAD (coronary artery disease)   . BPH (benign prostatic hypertrophy) 02/11/2011  . Premature atrial complexes 02/11/2011  . Panic attack  02/11/2011  . Hyperlipidemia 02/12/2010  . Atrial fibrillation, persistent ([place]) 10/30/2009  . GERD 03/12/2009  . ULCERATIVE COLITIS-LEFT SIDE 03/12/2009   Outpatient Encounter Prescriptions as of 08/28/2015  Medication Sig  . atorvastatin (LIPITOR) 40 MG tablet TAKE ONE TABLET AT BEDTIME  . Cholecalciferol (VITAMIN D) 1000 UNITS capsule Take 1 capsule (1,000 Units total) by mouth daily.  . digoxin (LANOXIN) 0.125 MG tablet TAKE 1 TABLET IN THE MORNING  . metoprolol (LOPRESSOR) 100 MG tablet TAKE 1 & 1/2 TABLETS TWICE A DAY  . mirabegron ER (MYRBETRIQ) 25 MG TB24 tablet Take 1 tablet (25 mg total) by mouth daily.  . nitroGLYCERIN (NITROSTAT) 0.4 MG SL tablet Place 1 tablet (0.4 mg total) under the tongue every 5 (five) minutes as needed for chest pain.  Marland Kitchen PARoxetine (PAXIL) 30 MG tablet TAKE 1 TABLET IN THE MORNING  . sulfaSALAzine (AZULFIDINE) 500 MG EC tablet 1 tablet (500 mg total) 2 (two) times daily. Take 500 mg twice a day by mouth for 1 week, then 1000 mg BID  . vitamin B-12 (CYANOCOBALAMIN) 1000 MCG tablet TAKE 1 TABLET IN THE MORNING  . warfarin (COUMADIN) 5 MG tablet Takes as directed   No facility-administered encounter medications on file as of 08/28/2015.      Review of Systems  Constitutional: Positive for unexpected weight change (weight loss).  HENT: Negative.   Eyes: Negative.   Respiratory: Negative.   Cardiovascular: Negative.   Gastrointestinal: Negative.   Endocrine: Negative.   Genitourinary: Negative.   Musculoskeletal: Negative.   Skin: Negative.  Allergic/Immunologic: Negative.   Neurological: Negative.   Hematological: Negative.   Psychiatric/Behavioral: Negative.        Objective:   Physical Exam  Constitutional: He is oriented to person, place, and time.  Elderly, hard of hearing.  HENT:  Head: Normocephalic and atraumatic.  Right Ear: External ear normal.  Left Ear: External ear normal.  Nose: Nose normal.  Mouth/Throat: Oropharynx is  clear and moist. No oropharyngeal exudate.  Eyes: Conjunctivae and EOM are normal. Pupils are equal, round, and reactive to light. Right eye exhibits no discharge. Left eye exhibits no discharge. No scleral icterus.  Neck: Normal range of motion. Neck supple. No thyromegaly present.  No bruits thyromegaly or anterior cervical adenopathy  Cardiovascular: Normal rate.   No murmur heard. The heart is irregular irregular at 72/m and I had difficulty finding pulses on either one of his feet.  Pulmonary/Chest: Effort normal and breath sounds normal. No respiratory distress. He has no wheezes. He has no rales. He exhibits no tenderness.  There is no axillary adenopathy and the lungs were clear front to back  Abdominal: Soft. Bowel sounds are normal. He exhibits no mass. There is no tenderness. There is no rebound and no guarding.  No abdominal masses or organ enlargement or inguinal adenopathy and no bruits  Musculoskeletal: Normal range of motion. He exhibits no edema or tenderness.  Lymphadenopathy:    He has no cervical adenopathy.  Neurological: He is alert and oriented to person, place, and time.  The patient has his ongoing Parkinsonian tremor in both hands left greater than right  Skin: Skin is warm and dry. No rash noted.  Psychiatric: He has a normal mood and affect. His behavior is normal. Judgment and thought content normal.  Nursing note and vitals reviewed.   BP 131/86 mmHg  Pulse 87  Temp(Src) 97 F (36.1 C) (Oral)  Ht _0  (1.753 m)  Wt 155 lb (70.308 kg)  BMI 22.88 kg/m2       Assessment & Plan:  1. BPH (benign prostatic hypertrophy) - CBC with Differential/Platelet  2. Vitamin D deficiency -Continue current treatment pending results of lab work - CBC with Differential/Platelet - Vit D  25 hydroxy (rtn osteoporosis monitoring)  3. Gastroesophageal reflux disease, esophagitis presence not specified -He has no complaints with this today - CBC with  Differential/Platelet - Lipid panel  4. Type 2 diabetes mellitus without complication, without long-term current use of insulin (HCC) -The patient does not check his blood sugars so we will wait and see what his A1c is before making any adjustments in medication or adding any medication. - POCT glycosylated hemoglobin (Hb A1C) - BMP8+EGFR - CBC with Differential/Platelet  5. Memory deficits -This continues to be a problem for the patient and a lot of the issues have to do with his poor hearing. - CBC with Differential/Platelet  6. Dyslipidemia -The patient should continue to watch his diet closely and should continue with exercise as tolerated and his atorvastatin - CBC with Differential/Platelet - Lipid panel  7. Persistent atrial fibrillation ([place]) -Follow-up with cardiology - POCT INR - Hepatic function panel - CBC with Differential/Platelet  Meds ordered this encounter  Medications  . Multiple Vitamin (MULTIVITAMIN WITH MINERALS) TABS tablet    Sig: Take 1 tablet by mouth daily.    Dispense:  30 tablet    Refill:  11   Patient Instructions  Medicare Annual Wellness Visit  [place] and the medical providers at [facility] strive to bring you the best medical care.  In doing so we not only want to address your current medical conditions and concerns but also to detect new conditions early and prevent illness, disease and health-related problems.    Medicare offers a yearly Wellness Visit which allows our clinical staff to assess your need for preventative services including immunizations, lifestyle education, counseling to decrease risk of preventable diseases and screening for fall risk and other medical concerns.    This visit is provided free of charge (no copay) for all Medicare recipients. The clinical pharmacists at [facility] have begun to conduct these Wellness Visits which will also include a  thorough review of all your medications.    As you primary medical provider recommend that you make an appointment for your Annual Wellness Visit if you have not done so already this year.  You may set up this appointment before you leave today or you may call back ([phone]) and schedule an appointment.  Please make sure when you call that you mention that you are scheduling your Annual Wellness Visit with the clinical pharmacist so that the appointment may be made for the proper length of time.     Continue current medications. Continue good therapeutic lifestyle changes which include good diet and exercise. Fall precautions discussed with patient. If an FOBT was given today- please return it to our front desk. If you are over 80 years old - you may need Prevnar 22 or the adult Pneumonia vaccine.  **Flu shots will be available soon--- please call and schedule a FLU-CLINIC appointment**  After your visit with [place] today you will receive a survey in the mail or online from [employer] regarding your care with [place]. Please take a moment to fill this out. Your feedback is very important to [place] as you can help [place] better understand your patient needs as well as improve your experience and satisfaction. WE CARE ABOUT YOU!!!    Anticoagulation Dose Instructions as of 08/28/2015      Dorene Grebe Tue Wed Thu Fri Sat   New Dose 2.5 mg 2.5 mg 5 mg 2.5 mg 5 mg 2.5 mg 2.5 mg    Description        No warfarin for 1 days (will notify pharmacy) Then continue current warfarin dose of 1/2 tablet daily except 1 tablet on tuesdays and thursdays.      INR was 3.1 today  We will have the pharmacy give the patient a good multivitamin to take 1 daily   Arrie Senate MD

## 2015-08-29 ENCOUNTER — Ambulatory Visit: Payer: Medicare Other | Admitting: Family Medicine

## 2015-08-29 LAB — CBC WITH DIFFERENTIAL/PLATELET
BASOS ABS: 0.1 10*3/uL (ref 0.0–0.2)
Basos: 1 %
EOS (ABSOLUTE): 0.1 10*3/uL (ref 0.0–0.4)
Eos: 2 %
Hematocrit: 41.8 % (ref 37.5–51.0)
Hemoglobin: 13.5 g/dL (ref 12.6–17.7)
IMMATURE GRANS (ABS): 0 10*3/uL (ref 0.0–0.1)
IMMATURE GRANULOCYTES: 0 %
LYMPHS: 23 %
Lymphocytes Absolute: 1.2 10*3/uL (ref 0.7–3.1)
MCH: 32.4 pg (ref 26.6–33.0)
MCHC: 32.3 g/dL (ref 31.5–35.7)
MCV: 100 fL — ABNORMAL HIGH (ref 79–97)
Monocytes Absolute: 0.5 10*3/uL (ref 0.1–0.9)
Monocytes: 9 %
NEUTROS PCT: 65 %
Neutrophils Absolute: 3.5 10*3/uL (ref 1.4–7.0)
PLATELETS: 195 10*3/uL (ref 150–379)
RBC: 4.17 x10E6/uL (ref 4.14–5.80)
RDW: 14 % (ref 12.3–15.4)
WBC: 5.4 10*3/uL (ref 3.4–10.8)

## 2015-08-29 LAB — BMP8+EGFR
BUN/Creatinine Ratio: 10 (ref 10–22)
BUN: 9 mg/dL (ref 8–27)
CO2: 31 mmol/L — AB (ref 18–29)
Calcium: 9.2 mg/dL (ref 8.6–10.2)
Chloride: 91 mmol/L — ABNORMAL LOW (ref 97–108)
Creatinine, Ser: 0.89 mg/dL (ref 0.76–1.27)
GFR, EST AFRICAN AMERICAN: 92 mL/min/{1.73_m2} (ref >59)
GFR, EST NON AFRICAN AMERICAN: 80 mL/min/{1.73_m2} (ref >59)
Glucose: 104 mg/dL — ABNORMAL HIGH (ref 65–99)
POTASSIUM: 4.4 mmol/L (ref 3.5–5.2)
SODIUM: 135 mmol/L (ref 134–144)

## 2015-08-29 LAB — HEPATIC FUNCTION PANEL
ALBUMIN: 4.3 g/dL (ref 3.5–4.7)
ALT: 13 IU/L (ref 0–44)
AST: 22 IU/L (ref 0–40)
Alkaline Phosphatase: 77 IU/L (ref 39–117)
Bilirubin Total: 0.9 mg/dL (ref 0.0–1.2)
Bilirubin, Direct: 0.27 mg/dL (ref 0.00–0.40)
Total Protein: 6.7 g/dL (ref 6.0–8.5)

## 2015-08-29 LAB — LIPID PANEL
CHOLESTEROL TOTAL: 112 mg/dL (ref 100–199)
Chol/HDL Ratio: 2.4 ratio units (ref 0.0–5.0)
HDL: 47 mg/dL (ref >39)
LDL Calculated: 56 mg/dL (ref 0–99)
Triglycerides: 46 mg/dL (ref 0–149)
VLDL CHOLESTEROL CAL: 9 mg/dL (ref 5–40)

## 2015-08-29 LAB — VITAMIN D 25 HYDROXY (VIT D DEFICIENCY, FRACTURES): Vit D, 25-Hydroxy: 50.6 ng/mL (ref 30.0–100.0)

## 2015-09-18 ENCOUNTER — Other Ambulatory Visit: Payer: Self-pay | Admitting: Pharmacist

## 2015-09-18 MED ORDER — MIRABEGRON ER 25 MG PO TB24: 25.0000 mg | ORAL_TABLET | Freq: Every day | ORAL | Status: DC

## 2015-09-24 ENCOUNTER — Ambulatory Visit (INDEPENDENT_AMBULATORY_CARE_PROVIDER_SITE_OTHER): Payer: Medicare Other | Admitting: Pharmacist Clinician (PhC)/ Clinical Pharmacy Specialist

## 2015-09-24 DIAGNOSIS — I4819 Other persistent atrial fibrillation: Secondary | ICD-10-CM

## 2015-09-24 DIAGNOSIS — I481 Persistent atrial fibrillation: Secondary | ICD-10-CM

## 2015-09-24 NOTE — Addendum Note (Signed)
Addended by: Tommas OlpHANDY, Pilot Prindle N on: 09/24/2015 10:59 AM   Modules accepted: Orders

## 2015-09-25 ENCOUNTER — Encounter: Payer: Self-pay | Admitting: *Deleted

## 2015-09-25 LAB — PROTIME-INR
INR: 2.1 — ABNORMAL HIGH (ref 0.8–1.2)
Prothrombin Time: 21.1 s — ABNORMAL HIGH (ref 9.1–12.0)

## 2015-09-25 LAB — BMP8+EGFR
BUN / CREAT RATIO: 13 (ref 10–22)
BUN: 12 mg/dL (ref 8–27)
CO2: 30 mmol/L — ABNORMAL HIGH (ref 18–29)
CREATININE: 0.93 mg/dL (ref 0.76–1.27)
Calcium: 9.4 mg/dL (ref 8.6–10.2)
Chloride: 91 mmol/L — ABNORMAL LOW (ref 97–106)
GFR calc non Af Amer: 76 mL/min/{1.73_m2} (ref >59)
GFR, EST AFRICAN AMERICAN: 88 mL/min/{1.73_m2} (ref >59)
GLUCOSE: 134 mg/dL — AB (ref 65–99)
Potassium: 4.8 mmol/L (ref 3.5–5.2)
SODIUM: 135 mmol/L — AB (ref 136–144)

## 2015-10-02 ENCOUNTER — Encounter: Payer: Self-pay | Admitting: Pharmacist

## 2015-10-03 ENCOUNTER — Telehealth: Payer: Self-pay | Admitting: *Deleted

## 2015-10-03 DIAGNOSIS — E119 Type 2 diabetes mellitus without complications: Secondary | ICD-10-CM

## 2015-10-03 NOTE — Telephone Encounter (Signed)
Patient has an appt with Marcelino DusterMichelle on 10/29/15 for protime. Appt note made that he needs a microalbumin to [facility]. Order placed.

## 2015-10-04 ENCOUNTER — Other Ambulatory Visit: Payer: Self-pay | Admitting: Family Medicine

## 2015-10-07 ENCOUNTER — Other Ambulatory Visit: Payer: Self-pay | Admitting: Family Medicine

## 2015-10-10 ENCOUNTER — Other Ambulatory Visit: Payer: Medicare Other

## 2015-10-10 NOTE — Progress Notes (Signed)
[facility]

## 2015-10-14 ENCOUNTER — Other Ambulatory Visit: Payer: Self-pay | Admitting: Family Medicine

## 2015-10-29 ENCOUNTER — Ambulatory Visit (INDEPENDENT_AMBULATORY_CARE_PROVIDER_SITE_OTHER): Payer: Medicare Other | Admitting: Pharmacist Clinician (PhC)/ Clinical Pharmacy Specialist

## 2015-10-29 DIAGNOSIS — I4819 Other persistent atrial fibrillation: Secondary | ICD-10-CM

## 2015-10-29 DIAGNOSIS — I481 Persistent atrial fibrillation: Secondary | ICD-10-CM | POA: Diagnosis not present

## 2015-10-29 DIAGNOSIS — E119 Type 2 diabetes mellitus without complications: Secondary | ICD-10-CM | POA: Diagnosis not present

## 2015-10-29 LAB — POCT INR: INR: 2

## 2015-10-29 NOTE — Addendum Note (Signed)
Addended by: Orma RenderHODGES, Cayla Wiegand F on: 10/29/2015 10:07 AM   Modules accepted: Orders

## 2015-10-31 ENCOUNTER — Other Ambulatory Visit: Payer: Self-pay | Admitting: Family Medicine

## 2015-10-31 LAB — MICROALBUMIN / CREATININE URINE RATIO

## 2015-11-01 ENCOUNTER — Other Ambulatory Visit: Payer: Self-pay

## 2015-11-01 NOTE — Telephone Encounter (Signed)
Last seen 08/28/15  DWM

## 2015-11-02 MED ORDER — SULFASALAZINE 500 MG PO TBEC: 500.0000 mg | DELAYED_RELEASE_TABLET | Freq: Two times a day (BID) | ORAL | Status: DC

## 2015-11-11 ENCOUNTER — Other Ambulatory Visit: Payer: Self-pay | Admitting: Family Medicine

## 2015-11-30 ENCOUNTER — Encounter (HOSPITAL_COMMUNITY): Payer: Self-pay | Admitting: *Deleted

## 2015-11-30 ENCOUNTER — Observation Stay (HOSPITAL_COMMUNITY)
Admission: EM | Admit: 2015-11-30 | Discharge: 2015-12-03 | Disposition: A | Discharge status: Home / Self Care | Payer: Medicare Other | Attending: Internal Medicine | Admitting: Internal Medicine

## 2015-11-30 ENCOUNTER — Emergency Department (HOSPITAL_COMMUNITY): Payer: Medicare Other

## 2015-11-30 DIAGNOSIS — F0390 Unspecified dementia without behavioral disturbance: Secondary | ICD-10-CM | POA: Insufficient documentation

## 2015-11-30 DIAGNOSIS — K219 Gastro-esophageal reflux disease without esophagitis: Secondary | ICD-10-CM | POA: Diagnosis not present

## 2015-11-30 DIAGNOSIS — G250 Essential tremor: Secondary | ICD-10-CM | POA: Diagnosis not present

## 2015-11-30 DIAGNOSIS — Z79899 Other long term (current) drug therapy: Secondary | ICD-10-CM | POA: Diagnosis not present

## 2015-11-30 DIAGNOSIS — E0781 Sick-euthyroid syndrome: Secondary | ICD-10-CM | POA: Diagnosis not present

## 2015-11-30 DIAGNOSIS — Z7901 Long term (current) use of anticoagulants: Secondary | ICD-10-CM | POA: Insufficient documentation

## 2015-11-30 DIAGNOSIS — I110 Hypertensive heart disease with heart failure: Secondary | ICD-10-CM | POA: Insufficient documentation

## 2015-11-30 DIAGNOSIS — E119 Type 2 diabetes mellitus without complications: Secondary | ICD-10-CM | POA: Insufficient documentation

## 2015-11-30 DIAGNOSIS — E785 Hyperlipidemia, unspecified: Secondary | ICD-10-CM | POA: Insufficient documentation

## 2015-11-30 DIAGNOSIS — N400 Enlarged prostate without lower urinary tract symptoms: Secondary | ICD-10-CM | POA: Insufficient documentation

## 2015-11-30 DIAGNOSIS — I481 Persistent atrial fibrillation: Secondary | ICD-10-CM | POA: Insufficient documentation

## 2015-11-30 DIAGNOSIS — I679 Cerebrovascular disease, unspecified: Principal | ICD-10-CM | POA: Insufficient documentation

## 2015-11-30 DIAGNOSIS — I482 Chronic atrial fibrillation: Secondary | ICD-10-CM | POA: Diagnosis not present

## 2015-11-30 DIAGNOSIS — R413 Other amnesia: Secondary | ICD-10-CM | POA: Diagnosis present

## 2015-11-30 DIAGNOSIS — Z955 Presence of coronary angioplasty implant and graft: Secondary | ICD-10-CM | POA: Insufficient documentation

## 2015-11-30 DIAGNOSIS — E669 Obesity, unspecified: Secondary | ICD-10-CM | POA: Insufficient documentation

## 2015-11-30 DIAGNOSIS — F419 Anxiety disorder, unspecified: Secondary | ICD-10-CM | POA: Insufficient documentation

## 2015-11-30 DIAGNOSIS — Z6823 Body mass index (BMI) 23.0-23.9, adult: Secondary | ICD-10-CM | POA: Diagnosis not present

## 2015-11-30 DIAGNOSIS — I2510 Atherosclerotic heart disease of native coronary artery without angina pectoris: Secondary | ICD-10-CM | POA: Diagnosis not present

## 2015-11-30 DIAGNOSIS — I5022 Chronic systolic (congestive) heart failure: Secondary | ICD-10-CM | POA: Diagnosis not present

## 2015-11-30 DIAGNOSIS — I6529 Occlusion and stenosis of unspecified carotid artery: Secondary | ICD-10-CM | POA: Diagnosis not present

## 2015-11-30 DIAGNOSIS — R404 Transient alteration of awareness: Secondary | ICD-10-CM

## 2015-11-30 DIAGNOSIS — Z8673 Personal history of transient ischemic attack (TIA), and cerebral infarction without residual deficits: Secondary | ICD-10-CM | POA: Diagnosis not present

## 2015-11-30 DIAGNOSIS — R4182 Altered mental status, unspecified: Secondary | ICD-10-CM | POA: Insufficient documentation

## 2015-11-30 DIAGNOSIS — I252 Old myocardial infarction: Secondary | ICD-10-CM | POA: Diagnosis not present

## 2015-11-30 DIAGNOSIS — K5120 Ulcerative (chronic) proctitis without complications: Secondary | ICD-10-CM | POA: Insufficient documentation

## 2015-11-30 DIAGNOSIS — Z87891 Personal history of nicotine dependence: Secondary | ICD-10-CM | POA: Diagnosis not present

## 2015-11-30 DIAGNOSIS — K5150 Left sided colitis without complications: Secondary | ICD-10-CM | POA: Diagnosis present

## 2015-11-30 DIAGNOSIS — I6789 Other cerebrovascular disease: Secondary | ICD-10-CM | POA: Diagnosis not present

## 2015-11-30 DIAGNOSIS — I4819 Other persistent atrial fibrillation: Secondary | ICD-10-CM | POA: Diagnosis present

## 2015-11-30 DIAGNOSIS — R251 Tremor, unspecified: Secondary | ICD-10-CM | POA: Diagnosis not present

## 2015-11-30 LAB — TSH: TSH: 5.19 u[IU]/mL — ABNORMAL HIGH (ref 0.350–4.500)

## 2015-11-30 LAB — CBC WITH DIFFERENTIAL/PLATELET
BASOS ABS: 0 10*3/uL (ref 0.0–0.1)
BASOS PCT: 1 %
EOS ABS: 0.2 10*3/uL (ref 0.0–0.7)
Eosinophils Relative: 3 %
HCT: 38 % — ABNORMAL LOW (ref 39.0–52.0)
HEMOGLOBIN: 12.4 g/dL — AB (ref 13.0–17.0)
LYMPHS ABS: 1.7 10*3/uL (ref 0.7–4.0)
Lymphocytes Relative: 26 %
MCH: 31.9 pg (ref 26.0–34.0)
MCHC: 32.6 g/dL (ref 30.0–36.0)
MCV: 97.7 fL (ref 78.0–100.0)
Monocytes Absolute: 0.7 10*3/uL (ref 0.1–1.0)
Monocytes Relative: 10 %
NEUTROS PCT: 60 %
Neutro Abs: 4 10*3/uL (ref 1.7–7.7)
PLATELETS: 197 10*3/uL (ref 150–400)
RBC: 3.89 MIL/uL — AB (ref 4.22–5.81)
RDW: 13.1 % (ref 11.5–15.5)
WBC: 6.6 10*3/uL (ref 4.0–10.5)

## 2015-11-30 LAB — URINALYSIS, ROUTINE W REFLEX MICROSCOPIC
BILIRUBIN URINE: NEGATIVE
GLUCOSE, UA: NEGATIVE mg/dL
Hgb urine dipstick: NEGATIVE
KETONES UR: NEGATIVE mg/dL
LEUKOCYTES UA: NEGATIVE
Nitrite: NEGATIVE
PH: 6 (ref 5.0–8.0)
Protein, ur: NEGATIVE mg/dL
SPECIFIC GRAVITY, URINE: 1.025 (ref 1.005–1.030)

## 2015-11-30 LAB — COMPREHENSIVE METABOLIC PANEL
ALBUMIN: 3.6 g/dL (ref 3.5–5.0)
ALK PHOS: 70 U/L (ref 38–126)
ALT: 17 U/L (ref 17–63)
AST: 25 U/L (ref 15–41)
Anion gap: 8 (ref 5–15)
BUN: 12 mg/dL (ref 6–20)
CALCIUM: 9 mg/dL (ref 8.9–10.3)
CHLORIDE: 93 mmol/L — AB (ref 101–111)
CO2: 34 mmol/L — AB (ref 22–32)
CREATININE: 0.81 mg/dL (ref 0.61–1.24)
GFR calc Af Amer: >60 mL/min (ref >60)
GFR calc non Af Amer: >60 mL/min (ref >60)
GLUCOSE: 104 mg/dL — AB (ref 65–99)
Potassium: 4.5 mmol/L (ref 3.5–5.1)
SODIUM: 135 mmol/L (ref 135–145)
Total Bilirubin: 0.7 mg/dL (ref 0.3–1.2)
Total Protein: 6.8 g/dL (ref 6.5–8.1)

## 2015-11-30 LAB — LIPASE, BLOOD: Lipase: 32 U/L (ref 11–51)

## 2015-11-30 LAB — CBG MONITORING, ED: Glucose-Capillary: 90 mg/dL (ref 65–99)

## 2015-11-30 LAB — DIGOXIN LEVEL: Digoxin Level: 0.4 ng/mL — ABNORMAL LOW (ref 0.8–2.0)

## 2015-11-30 LAB — PROTIME-INR
INR: 2.3 — ABNORMAL HIGH (ref 0.00–1.49)
PROTHROMBIN TIME: 25.1 s — AB (ref 11.6–15.2)

## 2015-11-30 MED ORDER — SODIUM CHLORIDE 0.9 % IV BOLUS (SEPSIS)
1000.0000 mL | Freq: Once | INTRAVENOUS | Status: AC
  Administered 2015-11-30: 1000 mL via INTRAVENOUS

## 2015-11-30 MED ORDER — INSULIN ASPART 100 UNIT/ML ~~LOC~~ SOLN
0.0000 [IU] | Freq: Three times a day (TID) | SUBCUTANEOUS | Status: DC
  Administered 2015-12-02: 1 [IU] via SUBCUTANEOUS

## 2015-11-30 MED ORDER — INSULIN ASPART 100 UNIT/ML ~~LOC~~ SOLN: 0.0000 [IU] | Freq: Every day | SUBCUTANEOUS | Status: DC

## 2015-11-30 NOTE — ED Notes (Signed)
Pt resting in room

## 2015-11-30 NOTE — ED Notes (Signed)
Pt stating that he felt like he really hurt the lady's feelings who showed up at his house saying she was his wife today. Pt stating that he needed to apologize to her.

## 2015-11-30 NOTE — ED Provider Notes (Signed)
CSN: [id number]     Arrival date & time 11/30/15  1920 History   First MD Initiated Contact with Patient 11/30/15 1925     Chief Complaint  Patient presents with  . Altered Mental Status     (Consider location/radiation/quality/duration/timing/severity/associated sxs/prior Treatment) HPI  82 year old male who thought that his wife was in posture. Never had this problem before. Does have some slight dementia but generally is just mildly confused. Has baseline tremor. No recent illnesses. Discussed with his caregiver who confirms this history. No fevers or other symptoms recently. No history of same. No exacerbating or relieving factors.  Past Medical History  Diagnosis Date  . Dyslipidemia   . Anxiety   . GIB (gastrointestinal bleeding)   . Obesity   . UC (ulcerative colitis confined to rectum) (HCC)     Left-sided  . BPH (benign prostatic hyperplasia)   . Diverticulosis   . GERD (gastroesophageal reflux disease)   . Hemorrhoids   . Diabetes mellitus   . Tremor   . CAD (coronary artery disease)     Anterior MI and DES stenting 2004, Vfib arrest; left main 25% stenosis, LAD 80% stenosis, 99% stenosis, 25% circumflex stenosis, 25% ramus intermedius stenosis, 50-40% right coronary artery stenosis, 90% RV branch stenosis with right to left collaterals; EF 55% with mild naterior hypokinesis (underwent Taxus stenting at the time) . Stress perfusion study January 2012 EF 48% with old apical infarct  . Hyperlipidemia   . Hyperplastic colon polyp   . Myocardial infarction (HCC)   . Hypertension   . Stroke (HCC)   . Memory deficits 05/05/2013  . Essential and other specified forms of tremor 05/05/2013  . Hearing deficit     Wears hearing aids  . Cataract   . Atrial fibrillation, persistent [facility](HCC)    Past Surgical History  Procedure Laterality Date  . Hammer toe surgery      right  . Inguinal hernia repair      right, bilateral  . Coronary angioplasty with stent placement      Taxus  stenting  . Cataract extraction w/phaco  10/27/2012    Procedure: CATARACT EXTRACTION PHACO AND INTRAOCULAR LENS PLACEMENT (IOC);  Surgeon: Gemma PayorKerry Hunt, MD;  Location: [facility];  Service: Ophthalmology;  Laterality: Left;  CDE 22.67   Family History  Problem Relation Age of Onset  . Colon cancer Neg Hx   . Alzheimer's disease Brother   . Diabetes Brother     x 4  . Diabetes Mother     ?  . Diabetes Father   . Heart disease Father   . Heart disease Paternal Grandfather   . Heart disease Brother     x 3  . COPD Sister   . Diabetes Sister    Social History  Substance Use Topics  . Smoking status: Former Smoker    Quit date: 11/23/1958  . Smokeless tobacco: Former NeurosurgeonUser    Types: Chew     Comment: Quit years ago  . Alcohol Use: No    Review of Systems  Neurological: Positive for tremors. Negative for dizziness, syncope, facial asymmetry, weakness, light-headedness, numbness and headaches.  All other systems reviewed and are negative.     Allergies  Asa-apap-salicyl-caff; Codeine; Nsaids; and Sulfonamide derivatives  Home Medications   Prior to Admission medications   Medication Sig Start Date End Date Taking? Authorizing Provider  atorvastatin (LIPITOR) 40 MG tablet TAKE ONE TABLET AT BEDTIME 10/04/15  Yes Ernestina Pennaonald W Moore, MD  Cholecalciferol (VITAMIN  D) 1000 UNITS capsule Take 1 capsule (1,000 Units total) by mouth daily. 06/26/13  Yes Tammy Eckard, PHARMD  digoxin (LANOXIN) 0.125 MG tablet TAKE 1 TABLET IN THE MORNING 10/08/15  Yes [name], MD  metoprolol (LOPRESSOR) 100 MG tablet TAKE 1 & 1/2 TABLETS TWICE A DAY 11/12/15  Yes [name], MD  mirabegron ER (MYRBETRIQ) 25 MG TB24 tablet Take 1 tablet (25 mg total) by mouth daily. 09/18/15  Yes Tammy Eckard, PHARMD  Multiple Vitamin (MULTIVITAMIN WITH MINERALS) TABS tablet Take 1 tablet by mouth daily. 08/28/15  Yes [name], MD  nitroGLYCERIN (NITROSTAT) 0.4 MG SL tablet Place 1 tablet (0.4 mg total) under the  tongue every 5 (five) minutes as needed for chest pain. 10/08/14  Yes [name], MD  PARoxetine (PAXIL) 30 MG tablet TAKE 1 TABLET IN THE MORNING 10/08/15  Yes [name], MD  sulfaSALAzine (AZULFIDINE) 500 MG EC tablet Take 1 tablet (500 mg total) by mouth 2 (two) times daily. Take 500 mg twice a day by mouth for 1 week, then 1000 mg BID Patient taking differently: Take 500 mg by mouth every morning.  11/02/15  Yes [name], MD  vitamin B-12 (CYANOCOBALAMIN) 1000 MCG tablet TAKE 1 TABLET IN THE MORNING 10/08/15  Yes [name], MD  warfarin (COUMADIN) 5 MG tablet Takes as directed Patient taking differently: take one tablet on Tuesdays and Thursdays, then take one-half tablet on all other days 11/01/15  Yes [name], MD   BP 124/78 mmHg  Pulse 91  Temp(Src) 97.1 F (36.2 C) (Rectal)  Resp 18  Wt 160 lb (72.576 kg)  SpO2 98% Physical Exam  Constitutional: He is oriented to person, place, and time. He appears well-developed and well-nourished.  HENT:  Head: Normocephalic and atraumatic.  Neck: Normal range of motion.  Cardiovascular: Normal rate and regular rhythm.   Pulmonary/Chest: Effort normal and breath sounds normal. No respiratory distress.  Abdominal: Soft. He exhibits no distension. There is no tenderness.  Musculoskeletal: Normal range of motion. He exhibits no edema or tenderness.  Neurological: He is alert and oriented to person, place, and time.  No altered mental status, able to give full seemingly accurate history.  Face is symmetric, EOM's intact, pupils equal and reactive, vision intact, tongue and uvula midline without deviation Upper and Lower extremity motor 5/5, intact pain perception in distal extremities, 2+ reflexes in biceps, patella and achilles tendons. Finger to nose normal, heel to shin normal.   Skin: Skin is warm and dry. No rash noted. No erythema.  Nursing note and vitals reviewed.   ED Course  Procedures (including critical  care time) Labs Review Labs Reviewed  CBC WITH DIFFERENTIAL/PLATELET - Abnormal; Notable for the following:    RBC 3.89 (*)    Hemoglobin 12.4 (*)    HCT 38.0 (*)    All other components within normal limits  COMPREHENSIVE METABOLIC PANEL - Abnormal; Notable for the following:    Chloride 93 (*)    CO2 34 (*)    Glucose, Bld 104 (*)    All other components within normal limits  TSH - Abnormal; Notable for the following:    TSH 5.190 (*)    All other components within normal limits  PROTIME-INR - Abnormal; Notable for the following:    Prothrombin Time 25.1 (*)    INR 2.30 (*)    All other components within normal limits  URINALYSIS, ROUTINE W REFLEX MICROSCOPIC (NOT AT  [facility])  LIPASE, BLOOD  DIGOXIN LEVEL    Imaging Review Dg Chest 2 View  11/30/2015  CLINICAL DATA:  Altered mental status. History of coronary artery disease and hypertension. EXAM: CHEST  2 VIEW COMPARISON:  None. FINDINGS: The cardiac silhouette is mildly enlarged. Mediastinal contours appear intact. There is no evidence of focal airspace consolidation, pleural effusion or pneumothorax. Osseous structures are without acute abnormality. Chronic rotator cuff injury of the right shoulder is seen. Soft tissues are grossly normal. IMPRESSION: Mildly enlarged cardiac silhouette, otherwise no evidence of active cardiopulmonary disease. Electronically Signed   By: Ted Mcalpine M.D.   On: 11/30/2015 20:38   Ct Head Wo Contrast  11/30/2015  CLINICAL DATA:  Altered mental status. EXAM: CT HEAD WITHOUT CONTRAST TECHNIQUE: Contiguous axial images were obtained from the base of the skull through the vertex without intravenous contrast. COMPARISON:  None. FINDINGS: Brain: No evidence of intracranial hemorrhage, extra-axial collection, ventriculomegaly, or mass effect. There are moderate brain parenchymal atrophy and chronic small vessel disease changes. There is a more prominent hypoattenuated area in the deep periventricular  white matter of the left frontal lobe, which may represent an age-indeterminate lacunar infarction. Few mm area of encephalomalacia within the medulla likely represents prior lacunar infarct. Vascular: No hyperdense vessel or unexpected calcification. Skull: Negative for fracture or focal lesion. Sinuses/Orbits: No acute findings. Other: None. IMPRESSION: Moderate brain parenchymal atrophy and chronic microvascular disease. Somewhat more prominent hypoattenuated area in the deep periventricular white matter of the left frontal lobe may represent an age-indeterminate lacunar infarct. A few mm area of encephalomalacia within the medulla likely represents a small remote lacunar infarct. Electronically Signed   By: Ted Mcalpine M.D.   On: 11/30/2015 20:47   I have personally reviewed and evaluated these images and lab results as part of my medical decision-making.   EKG Interpretation   Date/Time:  Saturday November 30 2015 19:26:02 EST Ventricular Rate:  87 PR Interval:    QRS Duration: 89 QT Interval:  333 QTC Calculation: 400 R Axis:   55 Text Interpretation:  Atrial flutter with predominant 3:1 AV block Low  voltage, precordial leads Probable anteroseptal infarct, old No  significant change since last tracing Reconfirmed by [facility] MD, Barbara Cower  [phone]) on 11/30/2015 7:49:30 PM      MDM   Final diagnoses:  Altered mental status    82 year old male here with taking his wife is in a posture. Seems like a [place] delusion however he also doesn't recognize his caregiver. No evidence of infection on labs and imaging or history. Doubt meningitis as he is alert without headache neck pain or fever. A CT scan which shows some old infarct and age indeterminate infarct. Discussed case with neurology, Dr. Leroy Kennedy, who felt that MRI would be indicated and observation overnight. Did not feel any other workup would be necessary at this point. So I discussed case with the hospitalist to insurance the  [facility].    Marily Memos, MD 11/30/15 [phone]

## 2015-11-30 NOTE — ED Notes (Signed)
Dr. Clayborne DanaMesner spoke with pt's sitter.

## 2015-11-30 NOTE — ED Notes (Signed)
EMS states that the pt did not recognize his wife of 50 yrs.

## 2015-11-30 NOTE — ED Notes (Signed)
F

## 2015-11-30 NOTE — ED Notes (Signed)
Called family to inform them [facility]

## 2015-11-30 NOTE — H&P (Signed)
[facility] History and Physical  [name] ZOX:[id number] DOB: May 01, 1930    PCP:   Rudi Heap, MD   Chief Complaint: sent in to the ER by caretaker and wife for altered mental status and not recognizing his wife.   HPI: [name] is an 82 y.o. male with hx of dementia, UC on sulfazalazine, essential tremors, anxiety hx of GIB, GERD, DM, known CAD, afib on Coumadin, brought to the ER as he was found confused, not recognizing his wife.  He told her to leave the house, thinking she was a stranger.  In the ER, he still maintained that she was not his wife.  However, he knows he is in [place], and knows basic information.  He is trying to convince me that it was not his wife.  He has no fever, chills, SOB, Chest pain, stiff neck or HA.  Work up in the ER with CT head showing infarct of the frontal lobe of undetermined age, and old CVA of the medulla.  He has hx of non obsructive left ICA stenosis, and has been followed by Dr Stephanie Acre of [facility].   EDP consulted with Dr Malachi Paradise, and he recommended at least an MRI of the brain.  Hospitalist was asked to admit him for further work up.   Rewiew of Systems:  Constitutional: Negative for malaise, fever and chills. No significant weight loss or weight gain Eyes: Negative for eye pain, redness and discharge, diplopia, visual changes, or flashes of light. ENMT: Negative for ear pain, hoarseness, nasal congestion, sinus pressure and sore throat. No headaches; tinnitus, drooling, or problem swallowing. Cardiovascular: Negative for chest pain, palpitations, diaphoresis, dyspnea and peripheral edema. ; No orthopnea, PND Respiratory: Negative for cough, hemoptysis, wheezing and stridor. No pleuritic chestpain. Gastrointestinal: Negative for nausea, vomiting, diarrhea, constipation, abdominal pain, melena, blood in stool, hematemesis, jaundice and rectal bleeding.    Genitourinary: Negative for frequency, dysuria, incontinence,flank pain and  hematuria; Musculoskeletal: Negative for back pain and neck pain. Negative for swelling and trauma.;  Skin: . Negative for pruritus, rash, abrasions, bruising and skin lesion.; ulcerations Neuro: Negative for headache, lightheadedness and neck stiffness. Negative for weakness, altered level of consciousness , altered mental status, extremity weakness, burning feet, involuntary movement, seizure and syncope.  Psych: negative for anxiety, depression, insomnia, tearfulness, panic attacks, hallucinations, paranoia, suicidal or homicidal ideation    Past Medical History  Diagnosis Date  . Dyslipidemia   . Anxiety   . GIB (gastrointestinal bleeding)   . Obesity   . UC (ulcerative colitis confined to rectum) (HCC)     Left-sided  . BPH (benign prostatic hyperplasia)   . Diverticulosis   . GERD (gastroesophageal reflux disease)   . Hemorrhoids   . Diabetes mellitus   . Tremor   . CAD (coronary artery disease)     Anterior MI and DES stenting 2004, Vfib arrest; left main 25% stenosis, LAD 80% stenosis, 99% stenosis, 25% circumflex stenosis, 25% ramus intermedius stenosis, 50-40% right coronary artery stenosis, 90% RV branch stenosis with right to left collaterals; EF 55% with mild naterior hypokinesis (underwent Taxus stenting at the time) . Stress perfusion study January 2012 EF 48% with old apical infarct  . Hyperlipidemia   . Hyperplastic colon polyp   . Myocardial infarction (HCC)   . Hypertension   . Stroke (HCC)   . Memory deficits 05/05/2013  . Essential and other specified forms of tremor 05/05/2013  . Hearing deficit     Wears hearing aids  .  Cataract   . Atrial fibrillation, persistent [facility])     Past Surgical History  Procedure Laterality Date  . Hammer toe surgery      right  . Inguinal hernia repair      right, bilateral  . Coronary angioplasty with stent placement      Taxus stenting  . Cataract extraction w/phaco  10/27/2012    Procedure: CATARACT EXTRACTION PHACO AND  INTRAOCULAR LENS PLACEMENT (IOC);  Surgeon: Gemma Payor, MD;  Location: [facility];  Service: Ophthalmology;  Laterality: Left;  CDE 22.67    Medications:  HOME MEDS: Prior to Admission medications   Medication Sig Start Date End Date Taking? Authorizing Provider  atorvastatin (LIPITOR) 40 MG tablet TAKE ONE TABLET AT BEDTIME 10/04/15  Yes [name], MD  Cholecalciferol (VITAMIN D) 1000 UNITS capsule Take 1 capsule (1,000 Units total) by mouth daily. 06/26/13  Yes Tammy Eckard, PHARMD  digoxin (LANOXIN) 0.125 MG tablet TAKE 1 TABLET IN THE MORNING 10/08/15  Yes [name], MD  metoprolol (LOPRESSOR) 100 MG tablet TAKE 1 & 1/2 TABLETS TWICE A DAY 11/12/15  Yes [name], MD  mirabegron ER (MYRBETRIQ) 25 MG TB24 tablet Take 1 tablet (25 mg total) by mouth daily. 09/18/15  Yes Tammy Eckard, PHARMD  Multiple Vitamin (MULTIVITAMIN WITH MINERALS) TABS tablet Take 1 tablet by mouth daily. 08/28/15  Yes [name], MD  nitroGLYCERIN (NITROSTAT) 0.4 MG SL tablet Place 1 tablet (0.4 mg total) under the tongue every 5 (five) minutes as needed for chest pain. 10/08/14  Yes [name], MD  PARoxetine (PAXIL) 30 MG tablet TAKE 1 TABLET IN THE MORNING 10/08/15  Yes [name], MD  sulfaSALAzine (AZULFIDINE) 500 MG EC tablet Take 1 tablet (500 mg total) by mouth 2 (two) times daily. Take 500 mg twice a day by mouth for 1 week, then 1000 mg BID Patient taking differently: Take 500 mg by mouth every morning.  11/02/15  Yes [name], MD  vitamin B-12 (CYANOCOBALAMIN) 1000 MCG tablet TAKE 1 TABLET IN THE MORNING 10/08/15  Yes [name], MD  warfarin (COUMADIN) 5 MG tablet Takes as directed Patient taking differently: take one tablet on Tuesdays and Thursdays, then take one-half tablet on all other days 11/01/15  Yes [name], MD     Allergies:  Allergies  Allergen Reactions  . Asa-Apap-Salicyl-Caff     Takes warfarin  . Codeine Other (See Comments)    Unknown reaction  .  Nsaids Other (See Comments)    On warfarin  . Sulfonamide Derivatives Nausea And Vomiting    Social History:   reports that he quit smoking about 57 years ago. He has quit using smokeless tobacco. His smokeless tobacco use included Chew. He reports that he does not drink alcohol or use illicit drugs.  Family History: Family History  Problem Relation Age of Onset  . Colon cancer Neg Hx   . Alzheimer's disease Brother   . Diabetes Brother     x 4  . Diabetes Mother     ?  . Diabetes Father   . Heart disease Father   . Heart disease Paternal Grandfather   . Heart disease Brother     x 3  . COPD Sister   . Diabetes Sister      Physical Exam: Filed Vitals:   11/30/15 2055 11/30/15 2126 11/30/15 2130 11/30/15 2200  BP: 126/75 105/92 124/78 129/78  Pulse: 95 91 91 94  Temp:  TempSrc:      Resp: 18 18 18    Weight:      SpO2: 100% 98% 98% 99%   Blood pressure 129/78, pulse 94, temperature 97.1 F (36.2 C), temperature source Rectal, resp. rate 18, weight 72.576 kg (160 lb), SpO2 99 %.  GEN:  Pleasant  patient lying in the stretcher in no acute distress; cooperative with exam. PSYCH:  alert and oriented x4; does not appear anxious or depressed; affect is appropriate. HEENT: Mucous membranes pink and anicteric; PERRLA; EOM intact; no cervical lymphadenopathy nor thyromegaly or carotid bruit; no JVD; There were no stridor. Neck is very supple. Breasts:: Not examined CHEST WALL: No tenderness CHEST: Normal respiration, clear to auscultation bilaterally.  HEART: Regular rate and rhythm.  There are no murmur, rub, or gallops.   BACK: No kyphosis or scoliosis; no CVA tenderness ABDOMEN: soft and non-tender; no masses, no organomegaly, normal abdominal bowel sounds; no pannus; no intertriginous candida. There is no rebound and no distention. Rectal Exam: Not done EXTREMITIES: No bone or joint deformity; age-appropriate arthropathy of the hands and knees; no edema; no  ulcerations.  There is no calf tenderness. Genitalia: not examined PULSES: 2+ and symmetric SKIN: Normal hydration no rash or ulceration CNS: Cranial nerves 2-12 grossly intact no focal lateralizing neurologic deficit.  Speech is fluent; uvula elevated with phonation, facial symmetry and tongue midline. DTR are normal bilaterally, cerebella exam is intact, barbinski is negative and strengths are equaled bilaterally.  No sensory loss.  Essential tremors.    Labs on Admission:  Basic Metabolic Panel:  Recent Labs Lab 11/30/15 1940  NA 135  K 4.5  CL 93*  CO2 34*  GLUCOSE 104*  BUN 12  CREATININE 0.81  CALCIUM 9.0   Liver Function Tests:  Recent Labs Lab 11/30/15 1940  AST 25  ALT 17  ALKPHOS 70  BILITOT 0.7  PROT 6.8  ALBUMIN 3.6    Recent Labs Lab 11/30/15 1940  LIPASE 32   No results for input(s): AMMONIA in the last 168 hours. CBC:  Recent Labs Lab 11/30/15 1940  WBC 6.6  NEUTROABS 4.0  HGB 12.4*  HCT 38.0*  MCV 97.7  PLT 197   Radiological Exams on Admission: Dg Chest 2 View  11/30/2015  CLINICAL DATA:  Altered mental status. History of coronary artery disease and hypertension. EXAM: CHEST  2 VIEW COMPARISON:  None. FINDINGS: The cardiac silhouette is mildly enlarged. Mediastinal contours appear intact. There is no evidence of focal airspace consolidation, pleural effusion or pneumothorax. Osseous structures are without acute abnormality. Chronic rotator cuff injury of the right shoulder is seen. Soft tissues are grossly normal. IMPRESSION: Mildly enlarged cardiac silhouette, otherwise no evidence of active cardiopulmonary disease. Electronically Signed   By: Ted Mcalpineobrinka  Dimitrova M.D.   On: 11/30/2015 20:38   Ct Head Wo Contrast  11/30/2015  CLINICAL DATA:  Altered mental status. EXAM: CT HEAD WITHOUT CONTRAST TECHNIQUE: Contiguous axial images were obtained from the base of the skull through the vertex without intravenous contrast. COMPARISON:  None. FINDINGS:  Brain: No evidence of intracranial hemorrhage, extra-axial collection, ventriculomegaly, or mass effect. There are moderate brain parenchymal atrophy and chronic small vessel disease changes. There is a more prominent hypoattenuated area in the deep periventricular white matter of the left frontal lobe, which may represent an age-indeterminate lacunar infarction. Few mm area of encephalomalacia within the medulla likely represents prior lacunar infarct. Vascular: No hyperdense vessel or unexpected calcification. Skull: Negative for fracture or focal lesion. Sinuses/Orbits:  No acute findings. Other: None. IMPRESSION: Moderate brain parenchymal atrophy and chronic microvascular disease. Somewhat more prominent hypoattenuated area in the deep periventricular white matter of the left frontal lobe may represent an age-indeterminate lacunar infarct. A few mm area of encephalomalacia within the medulla likely represents a small remote lacunar infarct. Electronically Signed   By: Ted Mcalpine M.D.   On: 11/30/2015 20:47    EKG: Independently reviewed.    Assessment/Plan Present on Admission:  . Cerebrovascular disease, unspecified . Atrial fibrillation, persistent (HCC) . Memory deficits . Persistent atrial fibrillation (HCC) . Altered mental status  PLAN:  AMS:  I suspect he has had another small frontal CVA.  WIll continue with anticoagulation and ASA.  I will transfer him to [facility] for full stroke work up with MRI/MRA, carotid US and ECHO.  He has had known nonobstructive carotid disease, in the 60 % stenosis, and has been seeing Dr Anne Hahn.  He is otherwise stable, and has no focal motor deficit.   AFIB:  Will continue with Coumadin.  Check Dig level.  Continue Betablocker for rate control.  Dementia:  Unclear if this is progressive of his diease, which could certainly be a possibility.   UC:  Continue with Sulfazalazine.     DM:  Will check CBG, sensitive SSI.  Carb modified.  I noted he is  not on any medication.   Other plans as per orders.  Code Status: FULL CODE>    Houston Siren, MD. FACP [facility] Pager [phone] 7pm to 7am.  11/30/2015, 10:33 PM

## 2015-11-30 NOTE — ED Notes (Addendum)
Number to call [phone] Harolyn RutherfordBrenda Knight sitter will answer.

## 2015-12-01 ENCOUNTER — Inpatient Hospital Stay (HOSPITAL_COMMUNITY): Payer: Medicare Other

## 2015-12-01 ENCOUNTER — Encounter (HOSPITAL_COMMUNITY): Payer: Self-pay | Admitting: *Deleted

## 2015-12-01 DIAGNOSIS — K219 Gastro-esophageal reflux disease without esophagitis: Secondary | ICD-10-CM | POA: Diagnosis not present

## 2015-12-01 DIAGNOSIS — Z7901 Long term (current) use of anticoagulants: Secondary | ICD-10-CM | POA: Diagnosis not present

## 2015-12-01 DIAGNOSIS — G250 Essential tremor: Secondary | ICD-10-CM

## 2015-12-01 DIAGNOSIS — Z87891 Personal history of nicotine dependence: Secondary | ICD-10-CM | POA: Diagnosis not present

## 2015-12-01 DIAGNOSIS — I679 Cerebrovascular disease, unspecified: Secondary | ICD-10-CM | POA: Diagnosis not present

## 2015-12-01 DIAGNOSIS — I6529 Occlusion and stenosis of unspecified carotid artery: Secondary | ICD-10-CM | POA: Diagnosis not present

## 2015-12-01 DIAGNOSIS — R413 Other amnesia: Secondary | ICD-10-CM | POA: Diagnosis not present

## 2015-12-01 DIAGNOSIS — F0390 Unspecified dementia without behavioral disturbance: Secondary | ICD-10-CM | POA: Diagnosis not present

## 2015-12-01 DIAGNOSIS — Z8673 Personal history of transient ischemic attack (TIA), and cerebral infarction without residual deficits: Secondary | ICD-10-CM | POA: Diagnosis not present

## 2015-12-01 DIAGNOSIS — I6330 Cerebral infarction due to thrombosis of unspecified cerebral artery: Secondary | ICD-10-CM | POA: Diagnosis not present

## 2015-12-01 DIAGNOSIS — I252 Old myocardial infarction: Secondary | ICD-10-CM | POA: Diagnosis not present

## 2015-12-01 DIAGNOSIS — E669 Obesity, unspecified: Secondary | ICD-10-CM | POA: Diagnosis not present

## 2015-12-01 DIAGNOSIS — E785 Hyperlipidemia, unspecified: Secondary | ICD-10-CM | POA: Diagnosis not present

## 2015-12-01 DIAGNOSIS — Z6823 Body mass index (BMI) 23.0-23.9, adult: Secondary | ICD-10-CM | POA: Diagnosis not present

## 2015-12-01 DIAGNOSIS — I638 Other cerebral infarction: Secondary | ICD-10-CM | POA: Diagnosis not present

## 2015-12-01 DIAGNOSIS — I482 Chronic atrial fibrillation: Secondary | ICD-10-CM | POA: Diagnosis not present

## 2015-12-01 DIAGNOSIS — I481 Persistent atrial fibrillation: Secondary | ICD-10-CM | POA: Diagnosis not present

## 2015-12-01 DIAGNOSIS — I6523 Occlusion and stenosis of bilateral carotid arteries: Secondary | ICD-10-CM | POA: Diagnosis not present

## 2015-12-01 DIAGNOSIS — I2510 Atherosclerotic heart disease of native coronary artery without angina pectoris: Secondary | ICD-10-CM

## 2015-12-01 DIAGNOSIS — E119 Type 2 diabetes mellitus without complications: Secondary | ICD-10-CM | POA: Diagnosis not present

## 2015-12-01 DIAGNOSIS — Z79899 Other long term (current) drug therapy: Secondary | ICD-10-CM | POA: Diagnosis not present

## 2015-12-01 DIAGNOSIS — I5022 Chronic systolic (congestive) heart failure: Secondary | ICD-10-CM | POA: Diagnosis not present

## 2015-12-01 DIAGNOSIS — K5120 Ulcerative (chronic) proctitis without complications: Secondary | ICD-10-CM | POA: Diagnosis not present

## 2015-12-01 DIAGNOSIS — R4182 Altered mental status, unspecified: Secondary | ICD-10-CM | POA: Diagnosis present

## 2015-12-01 DIAGNOSIS — I110 Hypertensive heart disease with heart failure: Secondary | ICD-10-CM | POA: Diagnosis not present

## 2015-12-01 DIAGNOSIS — Z955 Presence of coronary angioplasty implant and graft: Secondary | ICD-10-CM | POA: Diagnosis not present

## 2015-12-01 DIAGNOSIS — F419 Anxiety disorder, unspecified: Secondary | ICD-10-CM | POA: Diagnosis not present

## 2015-12-01 DIAGNOSIS — N400 Enlarged prostate without lower urinary tract symptoms: Secondary | ICD-10-CM | POA: Diagnosis not present

## 2015-12-01 DIAGNOSIS — E0781 Sick-euthyroid syndrome: Secondary | ICD-10-CM | POA: Diagnosis not present

## 2015-12-01 LAB — CBG MONITORING, ED
GLUCOSE-CAPILLARY: 103 mg/dL — AB (ref 65–99)
Glucose-Capillary: 100 mg/dL — ABNORMAL HIGH (ref 65–99)

## 2015-12-01 LAB — LIPID PANEL
CHOLESTEROL: 98 mg/dL (ref 0–200)
HDL: 39 mg/dL — ABNORMAL LOW (ref >40)
LDL Cholesterol: 52 mg/dL (ref 0–99)
TRIGLYCERIDES: 35 mg/dL (ref <150)
Total CHOL/HDL Ratio: 2.5 RATIO
VLDL: 7 mg/dL (ref 0–40)

## 2015-12-01 MED ORDER — MIRABEGRON ER 25 MG PO TB24
25.0000 mg | ORAL_TABLET | Freq: Every day | ORAL | Status: DC
  Administered 2015-12-01 – 2015-12-03 (×3): 25 mg via ORAL
  Filled 2015-12-01 (×4): qty 1

## 2015-12-01 MED ORDER — ASPIRIN 300 MG RE SUPP: 300.0000 mg | Freq: Every day | RECTAL | Status: DC

## 2015-12-01 MED ORDER — SULFASALAZINE 500 MG PO TBEC
500.0000 mg | DELAYED_RELEASE_TABLET | Freq: Every day | ORAL | Status: DC
Start: 2015-12-01 — End: 2015-12-03
  Administered 2015-12-01 – 2015-12-03 (×3): 500 mg via ORAL
  Filled 2015-12-01 (×4): qty 1

## 2015-12-01 MED ORDER — WARFARIN SODIUM 5 MG PO TABS: 2.5000 mg | ORAL_TABLET | Freq: Once | ORAL | Status: DC

## 2015-12-01 MED ORDER — METOPROLOL TARTRATE 100 MG PO TABS
100.0000 mg | ORAL_TABLET | Freq: Two times a day (BID) | ORAL | Status: DC
  Administered 2015-12-01 – 2015-12-03 (×5): 100 mg via ORAL
  Filled 2015-12-01: qty 2
  Filled 2015-12-01 (×4): qty 1

## 2015-12-01 MED ORDER — PAROXETINE HCL 20 MG PO TABS
30.0000 mg | ORAL_TABLET | Freq: Every morning | ORAL | Status: DC
  Administered 2015-12-01 – 2015-12-03 (×3): 30 mg via ORAL
  Filled 2015-12-01: qty 2
  Filled 2015-12-01: qty 1
  Filled 2015-12-01: qty 2
  Filled 2015-12-01: qty 1

## 2015-12-01 MED ORDER — ATORVASTATIN CALCIUM 40 MG PO TABS
40.0000 mg | ORAL_TABLET | Freq: Every day | ORAL | Status: DC
  Administered 2015-12-01 – 2015-12-02 (×2): 40 mg via ORAL
  Filled 2015-12-01 (×3): qty 1

## 2015-12-01 MED ORDER — VITAMIN B-12 1000 MCG PO TABS
1000.0000 ug | ORAL_TABLET | Freq: Every morning | ORAL | Status: DC
  Administered 2015-12-01 – 2015-12-03 (×3): 1000 ug via ORAL
  Filled 2015-12-01 (×4): qty 1

## 2015-12-01 MED ORDER — ASPIRIN 325 MG PO TABS
325.0000 mg | ORAL_TABLET | Freq: Every day | ORAL | Status: DC
Start: 2015-12-01 — End: 2015-12-03
  Administered 2015-12-01 – 2015-12-03 (×3): 325 mg via ORAL
  Filled 2015-12-01 (×3): qty 1

## 2015-12-01 MED ORDER — WARFARIN - PHARMACIST DOSING INPATIENT: Freq: Every day | Status: DC

## 2015-12-01 MED ORDER — DIGOXIN 125 MCG PO TABS
125.0000 ug | ORAL_TABLET | Freq: Every morning | ORAL | Status: DC
  Administered 2015-12-01 – 2015-12-03 (×3): 125 ug via ORAL
  Filled 2015-12-01 (×4): qty 1

## 2015-12-01 MED ORDER — VITAMIN D 1000 UNITS PO TABS
1000.0000 [IU] | ORAL_TABLET | Freq: Every day | ORAL | Status: DC
  Administered 2015-12-01 – 2015-12-03 (×3): 1000 [IU] via ORAL
  Filled 2015-12-01 (×4): qty 1

## 2015-12-01 MED ORDER — STROKE: EARLY STAGES OF RECOVERY BOOK
Freq: Once | Status: DC
  Filled 2015-12-01: qty 1

## 2015-12-01 NOTE — ED Notes (Signed)
Patient ate approximately 75% of breakfast. Total assist with feeding.

## 2015-12-01 NOTE — ED Notes (Signed)
Attempted to call report to floor. Unable to give at this time.

## 2015-12-01 NOTE — ED Notes (Signed)
[facility] at bedside 

## 2015-12-01 NOTE — Progress Notes (Signed)
Pt is refusing MRI, sts he will file a "suit" against the hospital. Spoke with pt offered to stay in room with pt, asked if family being in room w pt during exam, or anti anxiety meds would help. Pt said no. I realize pt is somewhat confused but he will not agree to do MRI. Called RN I am still waiting on her call back as she was in the phone when I called. [name] RT,R,MR

## 2015-12-01 NOTE — Progress Notes (Addendum)
[facility] PROGRESS NOTE  [name] ZOX:[id number] DOB: 1930-03-28 DOA: 11/30/2015 PCP: [name], DONALD, MD    Code Status: Full code Family Communication: Family not available Disposition Plan: Plan is to [facility] for stroke workup and management and then discharge when clinically appropriate   Consultants:  None; consider neurology consult  Procedures:  Echocardiogram and carotid ultrasound ordered and pending   Antibiotics:  None  HPI/Subjective: The patient is currently waiting in the ED for [facility]. There were no acute changes overnight per nursing. The patient tolerated his breakfast. He is sitting up in bed. He is hard of hearing. He has no complaints of headache, chest pain, or shortness of breath.   Objective: Filed Vitals:   12/01/15 0700 12/01/15 0830  BP: 129/83 118/86  Pulse: 91 97  Temp:    Resp: 18 22   temperature 98.4. Oxygen saturation 99% on room air.   Intake/Output Summary (Last 24 hours) at 12/01/15 0913 Last data filed at 12/01/15 [id number]  Gross per 24 hour  Intake   1178 ml  Output    200 ml  Net    978 ml   Filed Weights   11/30/15 1958  Weight: 72.576 kg (160 lb)    Exam:   General:  elderly 82 year old Caucasian man sitting up in bed, in no acute distress.   Cardiovascular: irregular, irregular   Respiratory: decreased breath sounds in the bases clear anteriorly; breathing nonlabored.   Abdomen: positive bowel sounds, soft, nontender, nondistended.   Musculoskeletal/extremities: No pedal edema. No acute hot red joints.   Neurologic: He is alert and oriented to himself and "[name]". He is not oriented to the time or year. No obvious facial droop. He has a facial tremor, tremor of his upper extremities, and tremor of his lower extremities. His handgrip is symmetric and 5 minus over 5. He has a mild left pronator drift. He is able to raise each leg against gravity approximately 30. Sensation is  grossly intact. He follows directions well. His speech is mostly clear.   Data Reviewed: Basic Metabolic Panel:  Recent Labs Lab 11/30/15 1940  NA 135  K 4.5  CL 93*  CO2 34*  GLUCOSE 104*  BUN 12  CREATININE 0.81  CALCIUM 9.0   Liver Function Tests:  Recent Labs Lab 11/30/15 1940  AST 25  ALT 17  ALKPHOS 70  BILITOT 0.7  PROT 6.8  ALBUMIN 3.6    Recent Labs Lab 11/30/15 1940  LIPASE 32   No results for input(s): AMMONIA in the last 168 hours. CBC:  Recent Labs Lab 11/30/15 1940  WBC 6.6  NEUTROABS 4.0  HGB 12.4*  HCT 38.0*  MCV 97.7  PLT 197   Cardiac Enzymes: No results for input(s): CKTOTAL, CKMB, CKMBINDEX, TROPONINI in the last 168 hours. BNP (last 3 results) No results for input(s): BNP in the last 8760 hours.  ProBNP (last 3 results) No results for input(s): PROBNP in the last 8760 hours.  CBG:  Recent Labs Lab 11/30/15 2327 12/01/15 0706  GLUCAP 90 100*    No results found for this or any previous visit (from the past 240 hour(s)).   Studies: Dg Chest 2 View  11/30/2015  CLINICAL DATA:  Altered mental status. History of coronary artery disease and hypertension. EXAM: CHEST  2 VIEW COMPARISON:  None. FINDINGS: The cardiac silhouette is mildly enlarged. Mediastinal contours appear intact. There is no evidence of focal airspace consolidation, pleural effusion or pneumothorax. Osseous  structures are without acute abnormality. Chronic rotator cuff injury of the right shoulder is seen. Soft tissues are grossly normal. IMPRESSION: Mildly enlarged cardiac silhouette, otherwise no evidence of active cardiopulmonary disease. Electronically Signed   By: [name] M.D.   On: 11/30/2015 20:38   Ct Head Wo Contrast  11/30/2015  CLINICAL DATA:  Altered mental status. EXAM: CT HEAD WITHOUT CONTRAST TECHNIQUE: Contiguous axial images were obtained from the base of the skull through the vertex without intravenous contrast. COMPARISON:  None.  FINDINGS: Brain: No evidence of intracranial hemorrhage, extra-axial collection, ventriculomegaly, or mass effect. There are moderate brain parenchymal atrophy and chronic small vessel disease changes. There is a more prominent hypoattenuated area in the deep periventricular white matter of the left frontal lobe, which may represent an age-indeterminate lacunar infarction. Few mm area of encephalomalacia within the medulla likely represents prior lacunar infarct. Vascular: No hyperdense vessel or unexpected calcification. Skull: Negative for fracture or focal lesion. Sinuses/Orbits: No acute findings. Other: None. IMPRESSION: Moderate brain parenchymal atrophy and chronic microvascular disease. Somewhat more prominent hypoattenuated area in the deep periventricular white matter of the left frontal lobe may represent an age-indeterminate lacunar infarct. A few mm area of encephalomalacia within the medulla likely represents a small remote lacunar infarct. Electronically Signed   By: [name] M.D.   On: 11/30/2015 20:47    Scheduled Meds: .  stroke: mapping our early stages of recovery book   Does not apply Once  . aspirin  300 mg Rectal Daily   Or  . aspirin  325 mg Oral Daily  . atorvastatin  40 mg Oral QHS  . digoxin  125 mcg Oral q morning - 10a  . insulin aspart  0-5 Units Subcutaneous QHS  . insulin aspart  0-9 Units Subcutaneous TID WC  . metoprolol  100 mg Oral BID  . mirabegron ER  25 mg Oral Daily  . PARoxetine  30 mg Oral q morning - 10a  . sulfaSALAzine  500 mg Oral q morning - 10a  . vitamin B-12  1,000 mcg Oral q morning - 10a  . Vitamin D  1,000 Units Oral Daily  . warfarin  2.5 mg Oral Once  . Warfarin - Pharmacist Dosing Inpatient   Does not apply q1800   Continuous Infusions:  Assessment and plan:  Principal Problem:   Cerebrovascular disease, unspecified Active Problems:   Memory deficits   Altered mental status   ASCVD (arteriosclerotic cardiovascular  disease)   Persistent atrial fibrillation (HCC)   ULCERATIVE COLITIS-LEFT SIDE   Essential tremor   High risk medication use   Type 2 diabetes, diet controlled (HCC)    1. Altered mental status/encephalopathy with memory deficit, possibly secondary to an acute lacunar stroke. Apparently, the patient presented with confusion and the inability to recognize his wife. Noncontrasted CT scan of his head revealed moderate brain atrophy with chronic microvascular disease and a more prominent area in the deep periventricular white matter of the left frontal lobe which may represent an age indeterminate. Possible acute lacunar stroke could explain his memory deficit. -The patient is already anticoagulated with Coumadin for treatment of atrial fibrillation. Aspirin was added. He was continued on Lipitor. His fasting lipid profile revealed a total cholesterol of 98, triglycerides of 35, HDL of 39, and LDL of 52. -Stroke evaluation studies ordered including carotid ultrasound, MRI of the brain, and 2-D echocardiogram. -We'll order vitamin B12 level and free T4. - MRI cannot be done over the weekend at  [name]. Also, the patient's neurologist, [name] and cardiologist, Dr. Caryn Section are both  in [place]. Discussed the patient with admitting physician Dr. Conley Rolls who made arrangements for the patient to be transferred to [facility] for further evaluation and management.  Chronic tremor. Apparently the patient has a chronic tremor for which she is followed by neurologist [name] per history. -Would recommend follow-up with [name] inpatient or as an outpatient.  Chronic atrial fibrillation on chronic Coumadin. Patient's INR on admission was therapeutic. He is treated with metoprolol for rate control. His rate is currently controlled. His TSH is slightly elevated at 5. Will order free T4 for further evaluation.  CAD. -Currently stable. Continue aspirin and metoprolol.  Ulcerated  colitis. Currently stable on sulfasalazine.  Diet-controlled type 2 diabetes mellitus. Currently stable and controlled on sliding scale NovoLog. Will order hemoglobin A1c.        Time spent: 35 minutes.     [facility]  [facility] Pager [phone] . If 7PM-7AM, please contact night-coverage at [email or web address], password [employer] [name] 12/01/2015, 9:13 AM  LOS: 1 day

## 2015-12-01 NOTE — ED Notes (Signed)
Patient stating need for toileting. Found to be incontinent when assisted with urinal. Bed pads changed. Patient watching TV. No distress. Vitals stable.

## 2015-12-01 NOTE — Progress Notes (Signed)
Call from [facility]  C. Schorr who adveised  For patient to attempt MRI again .

## 2015-12-01 NOTE — ED Notes (Signed)
Patient ate 1/2 of his sandwich for lunch with minimal assist.

## 2015-12-01 NOTE — ED Notes (Signed)
Patient left ED at this time with [facility]. 

## 2015-12-01 NOTE — ED Notes (Signed)
[facility] ETA 30 minutes.

## 2015-12-01 NOTE — Progress Notes (Signed)
Patient arrived to 5M[facility] at 1430 from [facility] by [facility] transfer  . Patient disoriented and restless. Vital signs taken and charted.  Oriented to room with bed alarm on. Taken to MRI at this time.

## 2015-12-01 NOTE — Progress Notes (Signed)
Call from radiology inquiring if MD had responded to patients refusal of MRI. M.C admission paged to inquire if patient had been seen was advises to call mid level. Mid level provider paged twice.

## 2015-12-01 NOTE — ED Notes (Signed)
Patient agitated towards end of Carotid US. Patient redirected with stern verbal commands and distraction techniques.

## 2015-12-01 NOTE — ED Notes (Signed)
Pt is sleeping

## 2015-12-01 NOTE — Progress Notes (Signed)
ANTICOAGULATION CONSULT NOTE - Initial Consult  Pharmacy Consult for coumadin Indication: atrial fibrillation  Allergies  Allergen Reactions  . Asa-Apap-Salicyl-Caff     Takes warfarin  . Codeine Other (See Comments)    Unknown reaction  . Nsaids Other (See Comments)    On warfarin  . Sulfonamide Derivatives Nausea And Vomiting    Patient Measurements: Weight: 160 lb (72.576 kg)   Vital Signs: Temp: 98.4 F (36.9 C) (01/08 0059) Temp Source: Rectal (01/07 1957) BP: 133/75 mmHg (01/08 0630) Pulse Rate: 99 (01/08 0630)  Labs:  Recent Labs  11/30/15 1940  HGB 12.4*  HCT 38.0*  PLT 197  LABPROT 25.1*  INR 2.30*  CREATININE 0.81    Estimated Creatinine Clearance: 69.1 mL/min (by C-G formula based on Cr of 0.81).   Medical History: Past Medical History  Diagnosis Date  . Dyslipidemia   . Anxiety   . GIB (gastrointestinal bleeding)   . Obesity   . UC (ulcerative colitis confined to rectum) (HCC)     Left-sided  . BPH (benign prostatic hyperplasia)   . Diverticulosis   . GERD (gastroesophageal reflux disease)   . Hemorrhoids   . Diabetes mellitus   . Tremor   . CAD (coronary artery disease)     Anterior MI and DES stenting 2004, Vfib arrest; left main 25% stenosis, LAD 80% stenosis, 99% stenosis, 25% circumflex stenosis, 25% ramus intermedius stenosis, 50-40% right coronary artery stenosis, 90% RV branch stenosis with right to left collaterals; EF 55% with mild naterior hypokinesis (underwent Taxus stenting at the time) . Stress perfusion study January 2012 EF 48% with old apical infarct  . Hyperlipidemia   . Hyperplastic colon polyp   . Myocardial infarction (HCC)   . Hypertension   . Stroke (HCC)   . Memory deficits 05/05/2013  . Essential and other specified forms of tremor 05/05/2013  . Hearing deficit     Wears hearing aids  . Cataract   . Atrial fibrillation, persistent (HCC)     Medications:  See medication history  Assessment: 82 yo man to  continue coumadin for afib.  Admission INR is therapeutic. Goal of Therapy:  INR 2-3 Monitor platelets by anticoagulation protocol: Yes   Plan:  Coumadin 2.5 mg today Daily PT/INR Monitor for bleeding complications  Thanks for allowing pharmacy to be a part of this patient's care.  Talbert CageLora Mersades Barbaro, PharmD Clinical Pharmacist  12/01/2015,7:11 AM

## 2015-12-02 ENCOUNTER — Inpatient Hospital Stay (HOSPITAL_COMMUNITY): Payer: Medicare Other

## 2015-12-02 ENCOUNTER — Other Ambulatory Visit: Payer: Self-pay | Admitting: Family Medicine

## 2015-12-02 DIAGNOSIS — I2510 Atherosclerotic heart disease of native coronary artery without angina pectoris: Secondary | ICD-10-CM | POA: Diagnosis not present

## 2015-12-02 DIAGNOSIS — R4182 Altered mental status, unspecified: Secondary | ICD-10-CM | POA: Diagnosis not present

## 2015-12-02 DIAGNOSIS — I679 Cerebrovascular disease, unspecified: Secondary | ICD-10-CM | POA: Diagnosis not present

## 2015-12-02 LAB — CBC
HCT: 39.9 % (ref 39.0–52.0)
Hemoglobin: 12.7 g/dL — ABNORMAL LOW (ref 13.0–17.0)
MCH: 31.1 pg (ref 26.0–34.0)
MCHC: 31.8 g/dL (ref 30.0–36.0)
MCV: 97.8 fL (ref 78.0–100.0)
PLATELETS: 171 10*3/uL (ref 150–400)
RBC: 4.08 MIL/uL — ABNORMAL LOW (ref 4.22–5.81)
RDW: 13.1 % (ref 11.5–15.5)
WBC: 7.4 10*3/uL (ref 4.0–10.5)

## 2015-12-02 LAB — GLUCOSE, CAPILLARY
GLUCOSE-CAPILLARY: 114 mg/dL — AB (ref 65–99)
GLUCOSE-CAPILLARY: 129 mg/dL — AB (ref 65–99)
GLUCOSE-CAPILLARY: 81 mg/dL (ref 65–99)
Glucose-Capillary: 99 mg/dL (ref 65–99)
Glucose-Capillary: 89 mg/dL (ref 65–99)

## 2015-12-02 LAB — PROTIME-INR
INR: 1.71 — AB (ref 0.00–1.49)
Prothrombin Time: 20.1 seconds — ABNORMAL HIGH (ref 11.6–15.2)

## 2015-12-02 LAB — BASIC METABOLIC PANEL
Anion gap: 6 (ref 5–15)
BUN: 11 mg/dL (ref 6–20)
CHLORIDE: 96 mmol/L — AB (ref 101–111)
CO2: 33 mmol/L — ABNORMAL HIGH (ref 22–32)
CREATININE: 0.83 mg/dL (ref 0.61–1.24)
Calcium: 8.9 mg/dL (ref 8.9–10.3)
GFR calc Af Amer: >60 mL/min (ref >60)
Glucose, Bld: 119 mg/dL — ABNORMAL HIGH (ref 65–99)
Potassium: 4.1 mmol/L (ref 3.5–5.1)
SODIUM: 135 mmol/L (ref 135–145)

## 2015-12-02 LAB — VITAMIN B12: Vitamin B-12: 1137 pg/mL — ABNORMAL HIGH (ref 180–914)

## 2015-12-02 LAB — HEMOGLOBIN A1C
Hgb A1c MFr Bld: 6 % — ABNORMAL HIGH (ref 4.8–5.6)
MEAN PLASMA GLUCOSE: 126 mg/dL

## 2015-12-02 LAB — T4, FREE: FREE T4: 1.07 ng/dL (ref 0.61–1.12)

## 2015-12-02 MED ORDER — WARFARIN - PHARMACIST DOSING INPATIENT: Freq: Every day | Status: DC

## 2015-12-02 MED ORDER — WARFARIN SODIUM 5 MG PO TABS
5.0000 mg | ORAL_TABLET | Freq: Once | ORAL | Status: AC
  Administered 2015-12-02: 5 mg via ORAL
  Filled 2015-12-02: qty 1

## 2015-12-02 NOTE — Progress Notes (Signed)
[facility] PROGRESS NOTE  [name] YQM:[id number] DOB: 10-20-1930 DOA: 11/30/2015 PCP: Rudi Heap, MD   Assessment and plan:    1. Altered mental status/encephalopathy with memory deficit possibly secondary to an acute lacunar stroke. Apparently, the patient presented with confusion and the inability to recognize his wife. Noncontrasted CT scan of his head revealed moderate brain atrophy with chronic microvascular disease and a more prominent area in the deep periventricular white matter of the left frontal lobe which may represent an age indeterminate.   He is already on Coumadin with therapeutic INR, he initially presented to [facility] where aspirin was added, full stroke workup underway, he seems to be close to his baseline at this time, neurology following. We will defer to neurology whether he requires aspirin in addition to Coumadin. His A1c is 6 his LDL is less than 70.   Chronic tremor, possibly underlying Parkinson's. Neurology on board, post discharge he will follow-up with [name] inpatient outpatient.  Chronic atrial fibrillation on chronic Coumadin. With [place] VASC 2 score of greater than 4 Patient's INR on admission was therapeutic. He is treated with metoprolol for rate control. His rate is currently controlled. Will see monitoring Coumadin.   CAD. Currently stable. Continue statin and metoprolol for secondary prevention, was on Coumadin, not on long-term aspirin.  Ulcerated colitis. Currently stable on sulfasalazine.  Sick euthyroid. TSH of 5 upon admission. Stable free T4, outpatient repeat TSH along with free T4 and T3 in 3-4 weeks.    Diet-controlled type 2 diabetes mellitus. Currently stable and controlled on sliding scale NovoLog. A1c of 6. Monitor CBGs.  CBG (last 3)   Recent Labs  12/01/15 2342 12/02/15 0648 12/02/15 1220  GLUCAP 81 114* 99        Subjective:  Patient sitting in recliner denies any headache, no chest or  abdominal pain, no new weakness.   Objective: Filed Vitals:   12/02/15 0312 12/02/15 0456  BP: 115/61 101/61  Pulse: 94 101  Temp: 97.9 F (36.6 C) 98 F (36.7 C)  Resp: 18 18   temperature 98.4. Oxygen saturation 99% on room air.   Intake/Output Summary (Last 24 hours) at 12/02/15 1307 Last data filed at 12/02/15 0830  Gross per 24 hour  Intake    360 ml  Output      0 ml  Net    360 ml   Filed Weights   11/30/15 1958  Weight: 72.576 kg (160 lb)    Exam:   General:  elderly 82 year old Caucasian man sitting up in bed, in no acute distress.   Cardiovascular: irregular, irregular   Respiratory: decreased breath sounds in the bases clear anteriorly; breathing nonlabored.   Abdomen: positive bowel sounds, soft, nontender, nondistended.   Musculoskeletal/extremities: No pedal edema. No acute hot red joints.   Neurologic: He is alert and oriented to himself and "[facility]". He is not oriented to the time or year. No obvious facial droop. He has a facial tremor, tremor of his upper extremities, and tremor of his lower extremities. His handgrip is symmetric and 5 minus over 5. He has a mild left pronator drift. He is able to raise each leg against gravity approximately 30. Sensation is grossly intact. He follows directions well. His speech is mostly clear.   Code Status: Full code Family Communication: Family not available Disposition Plan: Plan is to transfer to [facility] for stroke workup and management and then discharge when clinically appropriate   Consultants:  None; consider  neurology consult  Procedures:  Echocardiogram and carotid ultrasound ordered and pending   Antibiotics:  None   Data Reviewed:  Lab Results  Component Value Date   HGBA1C 6.0* 12/01/2015   Lab Results  Component Value Date   CHOL 98 12/01/2015   HDL 39* 12/01/2015   LDLCALC 52 12/01/2015   TRIG 35 12/01/2015   CHOLHDL 2.5 12/01/2015    Basic Metabolic Panel:  Recent  Labs Lab 11/30/15 1940 12/02/15 0655  NA 135 135  K 4.5 4.1  CL 93* 96*  CO2 34* 33*  GLUCOSE 104* 119*  BUN 12 11  CREATININE 0.81 0.83  CALCIUM 9.0 8.9   Liver Function Tests:  Recent Labs Lab 11/30/15 1940  AST 25  ALT 17  ALKPHOS 70  BILITOT 0.7  PROT 6.8  ALBUMIN 3.6    Recent Labs Lab 11/30/15 1940  LIPASE 32   No results for input(s): AMMONIA in the last 168 hours. CBC:  Recent Labs Lab 11/30/15 1940 12/02/15 0655  WBC 6.6 7.4  NEUTROABS 4.0  --   HGB 12.4* 12.7*  HCT 38.0* 39.9  MCV 97.7 97.8  PLT 197 171   Cardiac Enzymes: No results for input(s): CKTOTAL, CKMB, CKMBINDEX, TROPONINI in the last 168 hours. BNP (last 3 results) No results for input(s): BNP in the last 8760 hours.  ProBNP (last 3 results) No results for input(s): PROBNP in the last 8760 hours.  CBG:  Recent Labs Lab 12/01/15 0706 12/01/15 1238 12/01/15 2342 12/02/15 0648 12/02/15 1220  GLUCAP 100* 103* 81 114* 99    No results found for this or any previous visit (from the past 240 hour(s)).   Studies: Dg Chest 2 View  11/30/2015  CLINICAL DATA:  Altered mental status. History of coronary artery disease and hypertension. EXAM: CHEST  2 VIEW COMPARISON:  None. FINDINGS: The cardiac silhouette is mildly enlarged. Mediastinal contours appear intact. There is no evidence of focal airspace consolidation, pleural effusion or pneumothorax. Osseous structures are without acute abnormality. Chronic rotator cuff injury of the right shoulder is seen. Soft tissues are grossly normal. IMPRESSION: Mildly enlarged cardiac silhouette, otherwise no evidence of active cardiopulmonary disease. Electronically Signed   By: Ted Mcalpine M.D.   On: 11/30/2015 20:38   Ct Head Wo Contrast  11/30/2015  CLINICAL DATA:  Altered mental status. EXAM: CT HEAD WITHOUT CONTRAST TECHNIQUE: Contiguous axial images were obtained from the base of the skull through the vertex without intravenous contrast.  COMPARISON:  None. FINDINGS: Brain: No evidence of intracranial hemorrhage, extra-axial collection, ventriculomegaly, or mass effect. There are moderate brain parenchymal atrophy and chronic small vessel disease changes. There is a more prominent hypoattenuated area in the deep periventricular white matter of the left frontal lobe, which may represent an age-indeterminate lacunar infarction. Few mm area of encephalomalacia within the medulla likely represents prior lacunar infarct. Vascular: No hyperdense vessel or unexpected calcification. Skull: Negative for fracture or focal lesion. Sinuses/Orbits: No acute findings. Other: None. IMPRESSION: Moderate brain parenchymal atrophy and chronic microvascular disease. Somewhat more prominent hypoattenuated area in the deep periventricular white matter of the left frontal lobe may represent an age-indeterminate lacunar infarct. A few mm area of encephalomalacia within the medulla likely represents a small remote lacunar infarct. Electronically Signed   By: Ted Mcalpine M.D.   On: 11/30/2015 20:47   US Carotid Bilateral  12/01/2015  CLINICAL DATA:  82 year old male with altered mental status EXAM: BILATERAL CAROTID DUPLEX ULTRASOUND TECHNIQUE: Wallace Cullens scale imaging, color  Doppler and duplex ultrasound were performed of bilateral carotid and vertebral arteries in the neck. COMPARISON:  CT scan of the head 11/30/2015 FINDINGS: Criteria: Quantification of carotid stenosis is based on velocity parameters that correlate the residual internal carotid diameter with NASCET-based stenosis levels, using the diameter of the distal internal carotid lumen as the denominator for stenosis measurement. The following velocity measurements were obtained: RIGHT ICA:  143/15 cm/sec CCA:  116/7 cm/sec SYSTOLIC ICA/CCA RATIO:  1.2 DIASTOLIC ICA/CCA RATIO:  2.1 ECA:  156 cm/sec LEFT ICA:  93/22 cm/sec CCA:  116/14 cm/sec SYSTOLIC ICA/CCA RATIO:  0.8 DIASTOLIC ICA/CCA RATIO:  1.6 ECA:  115  cm/sec RIGHT CAROTID ARTERY: Mild heterogeneous atherosclerotic plaque scattered throughout the common carotid artery with irregularity in the region of the carotid bulb and proximal internal carotid artery. By peak systolic velocity criteria, the estimated stenosis falls into the 50- 69% diameter range. RIGHT VERTEBRAL ARTERY:  Patent with normal antegrade flow. LEFT CAROTID ARTERY: Heterogeneous atherosclerotic plaque in the proximal internal carotid artery. By peak systolic velocity criteria, the estimated stenosis remains less than 50%. LEFT VERTEBRAL ARTERY:  Patent with normal antegrade flow. IMPRESSION: 1. Moderate (50- 69%) stenosis proximal right internal carotid artery secondary to heterogeneous and mildly irregular atherosclerotic plaque. 2. Mild (1-49%) stenosis proximal left internal carotid artery secondary to heterogeneous atherosclerotic plaque. 3. Vertebral arteries are patent with normal antegrade flow. Signed, Sterling BigHeath K. McCullough, MD [facility] Electronically Signed   By: Malachy MoanHeath  McCullough M.D.   On: 12/01/2015 12:34    Scheduled Meds: .  stroke: mapping our early stages of recovery book   Does not apply Once  . aspirin  300 mg Rectal Daily   Or  . aspirin  325 mg Oral Daily  . atorvastatin  40 mg Oral QHS  . cholecalciferol  1,000 Units Oral Daily  . digoxin  125 mcg Oral q morning - 10a  . insulin aspart  0-5 Units Subcutaneous QHS  . insulin aspart  0-9 Units Subcutaneous TID WC  . metoprolol  100 mg Oral BID  . mirabegron ER  25 mg Oral Daily  . PARoxetine  30 mg Oral q morning - 10a  . sulfaSALAzine  500 mg Oral Daily  . vitamin B-12  1,000 mcg Oral q morning - 10a  . warfarin  5 mg Oral ONCE-1800  . Warfarin - Pharmacist Dosing Inpatient   Does not apply q1800   Continuous Infusions:    Time spent: 35 minutes.     Leroy SeaSINGH,PRASHANT K  [facility] Pager [phone] . If 7PM-7AM, please contact  night-coverage at [email or web address], password [email or web address] 12/02/2015, 1:07 PM  LOS: 2 days

## 2015-12-02 NOTE — Consult Note (Signed)
Requesting Physician: Dr. Thedore Mins    Chief Complaint: AMS/possible TIA  HPI:                                                                                                                                         [name] is an 82 y.o. male with hx of dementia, UC on sulfazalazine, essential tremors, anxiety hx of GIB, GERD, DM, known CAD, afib on Coumadin, brought to the ER 11/28/2015 as he was found confused, not recognizing his wife. He told her to leave the house, thinking she was a stranger. While in ED he remains confused. Initial Head CT showed a old frontal lobe infarct with no bleed. While in the hospital ASA was added to coumadin, total cholesterol of 98, triglycerides of 35, HDL of 39, and LDL of 52. MRI was attempted but patient refused both attempts.  Carotid doppler showed known right ICA stenosis 50-69% and left ICA 1-49%. Echo shows 40-45%.    All history obtained from chart as patient is only oriented to place.  He cannot provide any history.   He was seen in 2014 by [name] and at that time he had known memory disorder and tremor. At that time MMSE 21/30. He was to follow up in 6 months but I do not see a follow up.   Currently patient has no complaints, he is confused about why he is in the hospital. He has noted tremor but no facial assymetery or unilateral weakness.    TSH 5.1, A1c pending, B12  Date last known well: Unable to determine Time last known well: Unable to determine tPA Given: No: on Coumadin     Past Medical History  Diagnosis Date  . Dyslipidemia   . Anxiety   . GIB (gastrointestinal bleeding)   . Obesity   . UC (ulcerative colitis confined to rectum) (HCC)     Left-sided  . BPH (benign prostatic hyperplasia)   . Diverticulosis   . GERD (gastroesophageal reflux disease)   . Hemorrhoids   . Diabetes mellitus   . Tremor   . CAD (coronary artery disease)     Anterior MI and DES stenting 2004, Vfib arrest; left main 25% stenosis, LAD 80%  stenosis, 99% stenosis, 25% circumflex stenosis, 25% ramus intermedius stenosis, 50-40% right coronary artery stenosis, 90% RV branch stenosis with right to left collaterals; EF 55% with mild naterior hypokinesis (underwent Taxus stenting at the time) . Stress perfusion study January 2012 EF 48% with old apical infarct  . Hyperlipidemia   . Hyperplastic colon polyp   . Myocardial infarction (HCC)   . Hypertension   . Stroke (HCC)   . Memory deficits 05/05/2013  . Essential and other specified forms of tremor 05/05/2013  . Hearing deficit     Wears hearing aids  . Cataract   . Atrial fibrillation, persistent [facility])     Past Surgical  History  Procedure Laterality Date  . Hammer toe surgery      right  . Inguinal hernia repair      right, bilateral  . Coronary angioplasty with stent placement      Taxus stenting  . Cataract extraction w/phaco  10/27/2012    Procedure: CATARACT EXTRACTION PHACO AND INTRAOCULAR LENS PLACEMENT (IOC);  Surgeon: Gemma Payor, MD;  Location: [facility];  Service: Ophthalmology;  Laterality: Left;  CDE 22.67    Family History  Problem Relation Age of Onset  . Colon cancer Neg Hx   . Alzheimer's disease Brother   . Diabetes Brother     x 4  . Diabetes Mother     ?  . Diabetes Father   . Heart disease Father   . Heart disease Paternal Grandfather   . Heart disease Brother     x 3  . COPD Sister   . Diabetes Sister    Social History:  reports that he quit smoking about 57 years ago. He has quit using smokeless tobacco. His smokeless tobacco use included Chew. He reports that he does not drink alcohol or use illicit drugs.  Allergies:  Allergies  Allergen Reactions  . Asa-Apap-Salicyl-Caff     Takes warfarin  . Codeine Other (See Comments)    Unknown reaction  . Nsaids Other (See Comments)    On warfarin  . Sulfonamide Derivatives Nausea And Vomiting    Medications:                                                                                                                            Prior to Admission:  Prescriptions prior to admission  Medication Sig Dispense Refill Last Dose  . atorvastatin (LIPITOR) 40 MG tablet TAKE ONE TABLET AT BEDTIME 30 tablet 4 11/29/2015 at Unknown time  . Cholecalciferol (VITAMIN D) 1000 UNITS capsule Take 1 capsule (1,000 Units total) by mouth daily.   11/30/2015 at Unknown time  . digoxin (LANOXIN) 0.125 MG tablet TAKE 1 TABLET IN THE MORNING 30 tablet 4 11/30/2015 at Unknown time  . metoprolol (LOPRESSOR) 100 MG tablet TAKE 1 & 1/2 TABLETS TWICE A DAY 135 tablet 0 11/30/2015 at Unknown time  . mirabegron ER (MYRBETRIQ) 25 MG TB24 tablet Take 1 tablet (25 mg total) by mouth daily. 28 tablet 0 11/30/2015 at Unknown time  . Multiple Vitamin (MULTIVITAMIN WITH MINERALS) TABS tablet Take 1 tablet by mouth daily. 30 tablet 11 11/30/2015 at Unknown time  . nitroGLYCERIN (NITROSTAT) 0.4 MG SL tablet Place 1 tablet (0.4 mg total) under the tongue every 5 (five) minutes as needed for chest pain. 25 tablet 1 unknown  . PARoxetine (PAXIL) 30 MG tablet TAKE 1 TABLET IN THE MORNING 30 tablet 1 11/30/2015 at Unknown time  . sulfaSALAzine (AZULFIDINE) 500 MG EC tablet Take 1 tablet (500 mg total) by mouth 2 (two) times daily. Take 500 mg twice a day by mouth for 1 week, then 1000  mg BID (Patient taking differently: Take 500 mg by mouth every morning. ) 60 tablet 0 11/30/2015 at Unknown time  . vitamin B-12 (CYANOCOBALAMIN) 1000 MCG tablet TAKE 1 TABLET IN THE MORNING 30 tablet 4 11/30/2015 at Unknown time  . warfarin (COUMADIN) 5 MG tablet Takes as directed (Patient taking differently: take one tablet on Tuesdays and Thursdays, then take one-half tablet on all other days) 25 tablet 2 11/29/2015 at Unknown time   Scheduled: .  stroke: mapping our early stages of recovery book   Does not apply Once  . aspirin  300 mg Rectal Daily   Or  . aspirin  325 mg Oral Daily  . atorvastatin  40 mg Oral QHS  . cholecalciferol  1,000 Units Oral Daily  .  digoxin  125 mcg Oral q morning - 10a  . insulin aspart  0-5 Units Subcutaneous QHS  . insulin aspart  0-9 Units Subcutaneous TID WC  . metoprolol  100 mg Oral BID  . mirabegron ER  25 mg Oral Daily  . PARoxetine  30 mg Oral q morning - 10a  . sulfaSALAzine  500 mg Oral Daily  . vitamin B-12  1,000 mcg Oral q morning - 10a  . warfarin  2.5 mg Oral Once  . Warfarin - Pharmacist Dosing Inpatient   Does not apply q1800    ROS:                                                                                                                                       History obtained from unobtainable from patient due to mental status   Neurologic Examination:                                                                                                      Blood pressure 101/61, pulse 101, temperature 98 F (36.7 C), temperature source Oral, resp. rate 18, weight 72.576 kg (160 lb), SpO2 96 %.  HEENT-  Normocephalic, no lesions, without obvious abnormality.  Normal external eye and conjunctiva.  Normal TM's bilaterally.  Normal auditory canals and external ears. Normal external nose, mucus membranes and septum.  Normal pharynx. Cardiovascular- irregularly irregular rhythm, pulses palpable throughout   Lungs- chest clear, no wheezing, rales, normal symmetric air entry Abdomen- normal findings: bowel sounds normal Extremities- no edema Lymph-no adenopathy palpable Musculoskeletal-no joint tenderness, deformity or swelling Skin-warm and dry, no hyperpigmentation, vitiligo, or suspicious lesions  Neurological Examination Mental Status: Alert, oriented to hospital only.  Speech fluent without evidence of aphasia--able to follow commands, repeat, name objects and express himself.  Able to follow simple step commands with help. Cranial Nerves: II: Discs flat bilaterally; Visual fields grossly normal, pupils equal, round, reactive to light and accommodation III,IV, VI: ptosis not present,  extra-ocular motions intact bilaterally V,VII: smile symmetric--with masked facial expression, facial light touch sensation normal bilaterally VIII: hearing normal bilaterally IX,X: uvula rises symmetrically XI: bilateral shoulder shrug XII: midline tongue extension Motor: Right : Upper extremity   5/5    Left:     Upper extremity   5/5  Lower extremity   5/5     Lower extremity   5/5 --significant bilateral postural tremor which subsides with movement and increases when frustrated.  Tone and bulk:normal tone throughout; no atrophy noted Sensory: Pinprick and light touch intact throughout, bilaterally Deep Tendon Reflexes: 2+ and symmetric throughout UE no KJ or AJ Plantars: Right: downgoing   Left: downgoing Cerebellar: normal finger-to-nose,  Gait: not tested       Lab Results: Basic Metabolic Panel:  Recent Labs Lab 11/30/15 1940 12/02/15 0655  NA 135 135  K 4.5 4.1  CL 93* 96*  CO2 34* 33*  GLUCOSE 104* 119*  BUN 12 11  CREATININE 0.81 0.83  CALCIUM 9.0 8.9    Liver Function Tests:  Recent Labs Lab 11/30/15 1940  AST 25  ALT 17  ALKPHOS 70  BILITOT 0.7  PROT 6.8  ALBUMIN 3.6    Recent Labs Lab 11/30/15 1940  LIPASE 32   No results for input(s): AMMONIA in the last 168 hours.  CBC:  Recent Labs Lab 11/30/15 1940 12/02/15 0655  WBC 6.6 7.4  NEUTROABS 4.0  --   HGB 12.4* 12.7*  HCT 38.0* 39.9  MCV 97.7 97.8  PLT 197 171    Cardiac Enzymes: No results for input(s): CKTOTAL, CKMB, CKMBINDEX, TROPONINI in the last 168 hours.  Lipid Panel:  Recent Labs Lab 12/01/15 0533  CHOL 98  TRIG 35  HDL 39*  CHOLHDL 2.5  VLDL 7  LDLCALC 52    CBG:  Recent Labs Lab 11/30/15 2327 12/01/15 0706 12/01/15 1238 12/01/15 2342 12/02/15 0648  GLUCAP 90 100* 103* 81 114*    Microbiology: Results for orders placed or performed in visit on 01/10/14  Fecal occult blood, imunochemical     Status: None   Collection Time: 01/10/14  2:33 PM   Result Value Ref Range Status   Fecal Occult Bld Negative Negative Final    Coagulation Studies:  Recent Labs  11/30/15 1940 12/02/15 0655  LABPROT 25.1* 20.1*  INR 2.30* 1.71*    Imaging: Dg Chest 2 View  11/30/2015  CLINICAL DATA:  Altered mental status. History of coronary artery disease and hypertension. EXAM: CHEST  2 VIEW COMPARISON:  None. FINDINGS: The cardiac silhouette is mildly enlarged. Mediastinal contours appear intact. There is no evidence of focal airspace consolidation, pleural effusion or pneumothorax. Osseous structures are without acute abnormality. Chronic rotator cuff injury of the right shoulder is seen. Soft tissues are grossly normal. IMPRESSION: Mildly enlarged cardiac silhouette, otherwise no evidence of active cardiopulmonary disease. Electronically Signed   By: Ted Mcalpine M.D.   On: 11/30/2015 20:38   Ct Head Wo Contrast  11/30/2015  CLINICAL DATA:  Altered mental status. EXAM: CT HEAD WITHOUT CONTRAST TECHNIQUE: Contiguous axial images were obtained from the base of the skull through the vertex without intravenous contrast. COMPARISON:  None. FINDINGS: Brain: No evidence of intracranial hemorrhage,  extra-axial collection, ventriculomegaly, or mass effect. There are moderate brain parenchymal atrophy and chronic small vessel disease changes. There is a more prominent hypoattenuated area in the deep periventricular white matter of the left frontal lobe, which may represent an age-indeterminate lacunar infarction. Few mm area of encephalomalacia within the medulla likely represents prior lacunar infarct. Vascular: No hyperdense vessel or unexpected calcification. Skull: Negative for fracture or focal lesion. Sinuses/Orbits: No acute findings. Other: None. IMPRESSION: Moderate brain parenchymal atrophy and chronic microvascular disease. Somewhat more prominent hypoattenuated area in the deep periventricular white matter of the left frontal lobe may represent an  age-indeterminate lacunar infarct. A few mm area of encephalomalacia within the medulla likely represents a small remote lacunar infarct. Electronically Signed   By: Ted Mcalpineobrinka  Dimitrova M.D.   On: 11/30/2015 20:47   [place] Carotid Bilateral  12/01/2015  CLINICAL DATA:  82 year old male with altered mental status EXAM: BILATERAL CAROTID DUPLEX ULTRASOUND TECHNIQUE: Wallace CullensGray scale imaging, color Doppler and duplex ultrasound were performed of bilateral carotid and vertebral arteries in the neck. COMPARISON:  CT scan of the head 11/30/2015 FINDINGS: Criteria: Quantification of carotid stenosis is based on velocity parameters that correlate the residual internal carotid diameter with NASCET-based stenosis levels, using the diameter of the distal internal carotid lumen as the denominator for stenosis measurement. The following velocity measurements were obtained: RIGHT ICA:  143/15 cm/sec CCA:  116/7 cm/sec SYSTOLIC ICA/CCA RATIO:  1.2 DIASTOLIC ICA/CCA RATIO:  2.1 ECA:  156 cm/sec LEFT ICA:  93/22 cm/sec CCA:  116/14 cm/sec SYSTOLIC ICA/CCA RATIO:  0.8 DIASTOLIC ICA/CCA RATIO:  1.6 ECA:  115 cm/sec RIGHT CAROTID ARTERY: Mild heterogeneous atherosclerotic plaque scattered throughout the common carotid artery with irregularity in the region of the carotid bulb and proximal internal carotid artery. By peak systolic velocity criteria, the estimated stenosis falls into the 50- 69% diameter range. RIGHT VERTEBRAL ARTERY:  Patent with normal antegrade flow. LEFT CAROTID ARTERY: Heterogeneous atherosclerotic plaque in the proximal internal carotid artery. By peak systolic velocity criteria, the estimated stenosis remains less than 50%. LEFT VERTEBRAL ARTERY:  Patent with normal antegrade flow. IMPRESSION: 1. Moderate (50- 69%) stenosis proximal right internal carotid artery secondary to heterogeneous and mildly irregular atherosclerotic plaque. 2. Mild (1-49%) stenosis proximal left internal carotid artery secondary to heterogeneous  atherosclerotic plaque. 3. Vertebral arteries are patent with normal antegrade flow. Signed, Sterling BigHeath K. McCullough, MD [facility] Electronically Signed   By: Malachy MoanHeath  McCullough M.D.   On: 12/01/2015 12:34       Assessment and plan discussed with with attending physician and they are in agreement.    Felicie MornDavid Smith PA-C [facility] [phone]  12/02/2015, 8:42 AM   Assessment: 82 y.o. male who presents with episode of confusion in the settin gof progressive dementia. I suspect that this was more of a delirium related to his dementia rather than acute stroke. The hypodensity seen on CT I am not sure is truly new, but given stroke workup is already complete I think there is little harm in treating as if it is a new stroke. He also has essential tremor but   Stroke Risk Factors - atrial fibrillation, diabetes mellitus, hyperlipidemia and hypertension  1) Continue anticoagulation 2) LDL < 70, ok 3) echo 40 -45% 4) A1c 6, OK 5) Stroke workup is complete.  6) He has seen Dr. Anne HahnWillis in the past, would consider seeing him again for dementia.   Ritta SlotMcNeill Kannon Baum, MD [facility] [phone]  If 7pm-  7am, please page neurology on call as listed in [place].

## 2015-12-02 NOTE — Progress Notes (Signed)
Patient returned form Radiology  Call from MRI that patient refused to have MRI.

## 2015-12-02 NOTE — Progress Notes (Signed)
Pt refused MRI during second attempt. He started shouting "Get me out of here" and pulling off his gown and blanket.

## 2015-12-02 NOTE — Progress Notes (Signed)
Pt off to MRI

## 2015-12-02 NOTE — Progress Notes (Signed)
ANTICOAGULATION CONSULT NOTE   Pharmacy Consult for coumadin Indication: atrial fibrillation  Allergies  Allergen Reactions  . Asa-Apap-Salicyl-Caff     Takes warfarin  . Codeine Other (See Comments)    Unknown reaction  . Nsaids Other (See Comments)    On warfarin  . Sulfonamide Derivatives Nausea And Vomiting    Patient Measurements: Weight: 160 lb (72.576 kg)   Vital Signs: Temp: 98 F (36.7 C) (01/09 0456) Temp Source: Oral (01/09 0456) BP: 101/61 mmHg (01/09 0456) Pulse Rate: 101 (01/09 0456)  Labs:  Recent Labs  11/30/15 1940 12/02/15 0655  HGB 12.4* 12.7*  HCT 38.0* 39.9  PLT 197 171  LABPROT 25.1* 20.1*  INR 2.30* 1.71*  CREATININE 0.81 0.83    Estimated Creatinine Clearance: 67.4 mL/min (by C-G formula based on Cr of 0.83).  Assessment: 82 yo man continue on coumadin for history of afib. INR is subtherpeutic today at 1.71. H/H + platelets are stable and no bleeding noted. Pt did not receive his dose yesterday for unclear reasons.   Goal of Therapy:  INR 2-3 Monitor platelets by anticoagulation protocol: Yes   Plan:  - Warfarin 5mg  PO x 1 tonight - Daily INR  Lysle Pearlachel Kinsleigh Ludolph, PharmD, BCPS Pager # [phone] 12/02/2015 10:22 AM

## 2015-12-03 ENCOUNTER — Telehealth: Payer: Self-pay | Admitting: Family Medicine

## 2015-12-03 ENCOUNTER — Encounter: Payer: Self-pay | Admitting: Pharmacist Clinician (PhC)/ Clinical Pharmacy Specialist

## 2015-12-03 ENCOUNTER — Telehealth: Payer: Self-pay | Admitting: Neurology

## 2015-12-03 DIAGNOSIS — R4182 Altered mental status, unspecified: Secondary | ICD-10-CM | POA: Diagnosis not present

## 2015-12-03 DIAGNOSIS — I679 Cerebrovascular disease, unspecified: Secondary | ICD-10-CM | POA: Diagnosis not present

## 2015-12-03 DIAGNOSIS — I2510 Atherosclerotic heart disease of native coronary artery without angina pectoris: Secondary | ICD-10-CM | POA: Diagnosis not present

## 2015-12-03 DIAGNOSIS — I6789 Other cerebrovascular disease: Secondary | ICD-10-CM | POA: Diagnosis not present

## 2015-12-03 LAB — GLUCOSE, CAPILLARY
GLUCOSE-CAPILLARY: 91 mg/dL (ref 65–99)
Glucose-Capillary: 125 mg/dL — ABNORMAL HIGH (ref 65–99)

## 2015-12-03 LAB — PROTIME-INR
INR: 1.77 — AB (ref 0.00–1.49)
Prothrombin Time: 20.6 seconds — ABNORMAL HIGH (ref 11.6–15.2)

## 2015-12-03 LAB — HEMOGLOBIN A1C
HEMOGLOBIN A1C: 6.2 % — AB (ref 4.8–5.6)
Mean Plasma Glucose: 131 mg/dL

## 2015-12-03 MED ORDER — WARFARIN SODIUM 5 MG PO TABS
5.0000 mg | ORAL_TABLET | Freq: Once | ORAL | Status: DC
Start: 2015-12-03 — End: 2015-12-03

## 2015-12-03 NOTE — Progress Notes (Signed)
ANTICOAGULATION CONSULT NOTE   Pharmacy Consult for coumadin Indication: atrial fibrillation  Allergies  Allergen Reactions  . Asa-Apap-Salicyl-Caff     Takes warfarin  . Codeine Other (See Comments)    Unknown reaction  . Nsaids Other (See Comments)    On warfarin  . Sulfonamide Derivatives Nausea And Vomiting    Patient Measurements: Weight: 160 lb (72.576 kg)   Vital Signs: Temp: 97.9 F (36.6 C) (01/10 0659) Temp Source: Oral (01/10 0659) BP: 122/86 mmHg (01/10 0659) Pulse Rate: 89 (01/10 0659)  Labs:  Recent Labs  11/30/15 1940 12/02/15 0655 12/03/15 0505  HGB 12.4* 12.7*  --   HCT 38.0* 39.9  --   PLT 197 171  --   LABPROT 25.1* 20.1* 20.6*  INR 2.30* 1.71* 1.77*  CREATININE 0.81 0.83  --     Estimated Creatinine Clearance: 67.4 mL/min (by C-G formula based on Cr of 0.83).  Assessment: 82 yo man continue on coumadin for history of afib. INR is subtherpeutic today at 1.77. H/H + platelets are stable and no bleeding noted. Pt did not receive his dose 1/8 for unclear reasons why may have led to his subtherapeutic INR yesterday and today.   Goal of Therapy:  INR 2-3 Monitor platelets by anticoagulation protocol: Yes   Plan:  - Repeat Warfarin 5mg  PO x 1 tonight - Daily INR  Lysle Pearlachel Zettie Gootee, PharmD, BCPS Pager # [phone] 12/03/2015 9:40 AM

## 2015-12-03 NOTE — Progress Notes (Signed)
No acute events, doing well.   Awake and conversant.   Assessment: 82 y.o. male who presents with episode of confusion in the setting of progressive dementia. I suspect that this was more of a delirium related to his dementia rather than acute stroke. The hypodensity seen on CT I am not sure is truly new, but given stroke workup is already complete I think there is little harm in treating as if it is a new stroke.  I discussed with his nephew that he may need to consider assisted living and he has taken it under advisement.   Stroke workup is complete, no changes recommended.   Please call with any further questions or concerns.   Ritta SlotMcNeill Cyrus Ramsburg, MD [facility] [phone]  If 7pm- 7am, please page neurology on call as listed in [place].

## 2015-12-03 NOTE — Discharge Instructions (Signed)
Follow with Primary MD Rudi HeapMOORE, DONALD, MD in 7 days   Get CBC, CMP, INR  checked  by Primary MD next visit.    Activity: As tolerated with Full fall precautions use walker/cane & assistance as needed   Disposition Home     Diet:   Heart Healthy  with feeding assistance and aspiration precautions.  For Heart failure patients - Check your Weight same time everyday, if you gain over 2 pounds, or you develop in leg swelling, experience more shortness of breath or chest pain, call your Primary MD immediately. Follow Cardiac Low Salt Diet and 1.5 lit/day fluid restriction.   On your next visit with your primary care physician please Get Medicines reviewed and adjusted.   Please request your Prim.MD to go over all Hospital Tests and Procedure/Radiological results at the follow up, please get all Hospital records sent to your Prim MD by signing hospital release before you go home.   If you experience worsening of your admission symptoms, develop shortness of breath, life threatening emergency, suicidal or homicidal thoughts you must seek medical attention immediately by calling 911 or calling your MD immediately  if symptoms less severe.  You Must read complete instructions/literature along with all the possible adverse reactions/side effects for all the Medicines you take and that have been prescribed to you. Take any new Medicines after you have completely understood and accpet all the possible adverse reactions/side effects.   Do not drive, operating heavy machinery, perform activities at heights, swimming or participation in water activities or provide baby sitting services if your were admitted for syncope or siezures until you have seen by Primary MD or a Neurologist and advised to do so again.  Do not drive when taking Pain medications.    Do not take more than prescribed Pain, Sleep and Anxiety Medications  Special Instructions: If you have smoked or chewed Tobacco  in the last 2 yrs  please stop smoking, stop any regular Alcohol  and or any Recreational drug use.  Wear Seat belts while driving.   Please note  You were cared for by a hospitalist during your hospital stay. If you have any questions about your discharge medications or the care you received while you were in the hospital after you are discharged, you can call the unit and asked to speak with the hospitalist on call if the hospitalist that took care of you is not available. Once you are discharged, your primary care physician will handle any further medical issues. Please note that NO REFILLS for any discharge medications will be authorized once you are discharged, as it is imperative that you return to your primary care physician (or establish a relationship with a primary care physician if you do not have one) for your aftercare needs so that they can reassess your need for medications and monitor your lab values.

## 2015-12-03 NOTE — Care Management Note (Signed)
Case Management Note  Patient Details  Name: [name] MRN: [id number] Date of Birth: [birth year]  Subjective/Objective:                    Action/Plan: CM spoke with patient's nephew, [name], to discuss discharge planning.  Per nephew, patient and wife both have dementia. APS is involved, as nephew feels they are not adequately able to care for themselves.  Nephew is POA, and has chosen [facility] for HHPT/RN/CSW/HH aide.  Patient and wife are being assisted by friends, neighbors and people from their church.  They have a neighbor who is paid to be there in the afternoons daily, for a period of time.  Nephew's goal is to get patient and wife into ALF or memory care, which they have been resistent to in the past.  [facility] for discharge home later today.  Patient's wife and nephew are to be notified of appointments every time due to patient and wife's memory deficits. Phone numbers are in the chart.  CSW was consulted to arrange for ambulance transport home.  Address was verified with nephew. Awaiting PT evals prior to discharge for any equipment recommendations for home.  Bedside RN updated and will contact both patient's wife as well as nephew once patient is discharged.  Expected Discharge Date:                  Expected Discharge Plan:  Home w Home Health Services  In-House Referral:  Clinical Social Work  Discharge planning Services  CM Consult  Post Acute Care Choice:  Home Health Choice offered to:     DME Arranged:    DME Agency:     HH Arranged:  RN, PT, OT, Nurse's Aide HH Agency:  [facility]  Status of Service:  In process, will continue to follow  Medicare Important Message Given:    Date Medicare IM Given:    Medicare IM give by:    Date Additional Medicare IM Given:    Additional Medicare Important Message give by:     If discussed at Long Length of Stay Meetings, dates discussed:     Additional Comments:  Anda KraftRobarge, Jonathon Tan C, RN 12/03/2015, 10:55 AM [phone]

## 2015-12-03 NOTE — Progress Notes (Signed)
Discharge instructions discussed with patient's nephew. Per case management, family agreed to home health per MD order. PT/OT to evaluate patient for any home health equipment needs, patient to be discharged after. Tele discontinued, IV removed. Will continue to monitor closely.

## 2015-12-03 NOTE — Telephone Encounter (Signed)
[name] with [facility] called to schedule 2 week f/u with Dr Anne HahnWillis. Pt seen in ED for CVA 11/30/15. I advised [name] a RN would call pt to schedule appt as Dr Anne HahnWillis is booked into May.

## 2015-12-03 NOTE — Progress Notes (Signed)
Attempted to notify patient's nephew and patient's wife of transportation home. Attempted both lines x3.

## 2015-12-03 NOTE — Clinical Social Work Note (Signed)
Address confirmed. Transportation via [facility] arranged.   Clinical Social Worker will sign off for now as social work intervention is no longer needed. Please consult us again if new need arises.  [name], MSW, LCSWA [phone] 12/03/2015 11:06 AM

## 2015-12-03 NOTE — Evaluation (Signed)
Physical Therapy Evaluation and Discharge Patient Details Name: [name] MRN: [id number] DOB: Sep 24, 1930 Today's Date: 12/03/2015   History of Present Illness  Pt is an 82 y/o male who presented to the ED with wife and caretaker with AMS after not recognizing his wife at home. 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  Clinical Impression  Patient evaluated by Physical Therapy with no further acute PT needs identified. This patient refused acute care therapy however could benefit from at least a home health safety screen at d/c. Pt appears untrusting of hospital staff and refuses to transfer OOB with therapist. Threatened to punch therapist and was verbally aggressive throughout session. Per conversation with the case manager, an ambulance transfer has been set up for return home, and home situation is fairly complicated with a neighbor and church members looking in on the pt and his wife throughout the week. It is not clear what kind of assistance is needed for safe mobility at this time, or what DME is available at home. Recommending a 3-in-1 BSC for use as a shower seat to encourage participation in bathing. See below for any follow-up Physical Therapy or equipment needs. PT is signing off. Thank you for this referral.    Follow Up Recommendations Home health PT;Supervision for mobility/OOB    Equipment Recommendations  3in1 (PT)    Recommendations for Other Services       Precautions / Restrictions Precautions Precautions: Fall Precaution Comments: Dementia at baseline - very untrusting of hospital staff Restrictions Weight Bearing Restrictions: No      Mobility  Bed Mobility Overal bed mobility: Needs Assistance Bed Mobility: Supine to Sit     Supine to sit: Supervision     General bed mobility comments: Pt was able to transition to EOB without assistance. Min use of rails - likely bcause they were there. Feel pt  would have been able to perform transfer without rails for assistance.   Transfers                 General transfer comment: Pt refused all other mobility  Ambulation/Gait                Stairs            Wheelchair Mobility    Modified Rankin (Stroke Patients Only)       Balance Overall balance assessment: Needs assistance Sitting-balance support: Feet unsupported;Single extremity supported Sitting balance-Leahy Scale: Fair Sitting balance - Comments: Noted flexed trunk and sacral sitting. Pt refused to scoot out so his feet were supported on the floor.                                      Pertinent Vitals/Pain Pain Assessment: Faces Faces Pain Scale: No hurt    Home Living Family/patient expects to be discharged to:: Private residence Living Arrangements: Spouse/significant other Available Help at Discharge: Family;Friend(s);Available PRN/intermittently Type of Home: House           Additional Comments: Pt unable/unwilling to answer questions regarding home environment. Per conversation with CM, pt has a neighbor that checks on him and church members that look in on the pt and his wife throughout the week.      Prior Function           Comments: It appears that pt has poor hygeine  and it is unclear whether pt is unable to perform ADL's at home or whether he just chooses not to perform ADL's.     Hand Dominance        Extremity/Trunk Assessment   Upper Extremity Assessment: Generalized weakness           Lower Extremity Assessment: Generalized weakness      Cervical / Trunk Assessment: Kyphotic  Communication   Communication: No difficulties  Cognition Arousal/Alertness: Awake/alert Behavior During Therapy: Restless Overall Cognitive Status: History of cognitive impairments - at baseline       Memory: Decreased short-term memory              General Comments      Exercises         Assessment/Plan    PT Assessment All further PT needs can be met in the next venue of care  PT Diagnosis Difficulty walking   PT Problem List Decreased cognition;Decreased mobility;Decreased balance;Decreased activity tolerance  PT Treatment Interventions     PT Goals (Current goals can be found in the Care Plan section) Acute Rehab PT Goals PT Goal Formulation: All assessment and education complete, DC therapy    Frequency     Barriers to discharge        Co-evaluation               End of Session   Activity Tolerance: Treatment limited secondary to agitation Patient left: with bed alarm set;with call bell/phone within reach (Sitting EOB) Nurse Communication: Mobility status    Functional Assessment Tool Used: Clinical judgement Functional Limitation: Mobility: Walking and moving around Mobility: Walking and Moving Around Current Status (B5670): At least 20 percent but less than 40 percent impaired, limited or restricted Mobility: Walking and Moving Around Goal Status [phone]): At least 20 percent but less than 40 percent impaired, limited or restricted Mobility: Walking and Moving Around Discharge Status [phone]): At least 20 percent but less than 40 percent impaired, limited or restricted    Time: 3888-7579 PT Time Calculation (min) (ACUTE ONLY): 15 min   Charges:   PT Evaluation $PT Eval Moderate Complexity: 1 Procedure     PT G Codes:   PT G-Codes **NOT FOR INPATIENT CLASS** Functional Assessment Tool Used: Clinical judgement Functional Limitation: Mobility: Walking and moving around Mobility: Walking and Moving Around Current Status (J2820): At least 20 percent but less than 40 percent impaired, limited or restricted Mobility: Walking and Moving Around Goal Status [phone]): At least 20 percent but less than 40 percent impaired, limited or restricted Mobility: Walking and Moving Around Discharge Status [phone]): At least 20 percent but less than 40 percent  impaired, limited or restricted    Rolinda Roan 12/03/2015, 12:07 PM   Rolinda Roan, PT, DPT Acute Rehabilitation Services Pager: [phone]

## 2015-12-03 NOTE — Progress Notes (Addendum)
Patient refuses MRI. Patient states "I just can't do it." MRI attempted x3 previously, patient was unable to stay still. Will notify MD.  Neurology MD and attending aware of patient's refusal. MRI/MRA cancelled.

## 2015-12-03 NOTE — Telephone Encounter (Signed)
Spoke with [name] and patient has appointment on 1/17 @ 11:15 with Christell ConstantMoore.

## 2015-12-03 NOTE — Progress Notes (Signed)
Patient's family requested patient to be bathed prior to discharge home. Patient refused bath, refused gown change, linen change. Will continue to monitor.

## 2015-12-03 NOTE — Discharge Summary (Signed)
[name], is a 82 y.o. male  DOB 19-Nov-1930  MRN [id number].  Admission date:  11/30/2015  Admitting Physician  Ron Parker, MD  Discharge Date:  12/03/2015   Primary MD  Rudi Heap, MD  Recommendations for primary care physician for things to follow:   Check CBC, BMP and INR in a week. Outpatient cardiology, neurology and vascular surgery follow-up   Admission Diagnosis  Altered mental status [R41.82]   Discharge Diagnosis  Altered mental status [R41.82]     Principal Problem:   Cerebrovascular disease, unspecified Active Problems:   ULCERATIVE COLITIS-LEFT SIDE   Memory deficits   Essential tremor   High risk medication use   ASCVD (arteriosclerotic cardiovascular disease)   Persistent atrial fibrillation (HCC)   Type 2 diabetes, diet controlled (HCC)   Altered mental status      Past Medical History  Diagnosis Date  . Dyslipidemia   . Anxiety   . GIB (gastrointestinal bleeding)   . Obesity   . UC (ulcerative colitis confined to rectum) (HCC)     Left-sided  . BPH (benign prostatic hyperplasia)   . Diverticulosis   . GERD (gastroesophageal reflux disease)   . Hemorrhoids   . Diabetes mellitus   . Tremor   . CAD (coronary artery disease)     Anterior MI and DES stenting 2004, Vfib arrest; left main 25% stenosis, LAD 80% stenosis, 99% stenosis, 25% circumflex stenosis, 25% ramus intermedius stenosis, 50-40% right coronary artery stenosis, 90% RV branch stenosis with right to left collaterals; EF 55% with mild naterior hypokinesis (underwent Taxus stenting at the time) . Stress perfusion study January 2012 EF 48% with old apical infarct  . Hyperlipidemia   . Hyperplastic colon polyp   . Myocardial infarction (HCC)   . Hypertension   . Stroke (HCC)   . Memory deficits 05/05/2013  .  Essential and other specified forms of tremor 05/05/2013  . Hearing deficit     Wears hearing aids  . Cataract   . Atrial fibrillation, persistent [facility])     Past Surgical History  Procedure Laterality Date  . Hammer toe surgery      right  . Inguinal hernia repair      right, bilateral  . Coronary angioplasty with stent placement      Taxus stenting  . Cataract extraction w/phaco  10/27/2012    Procedure: CATARACT EXTRACTION PHACO AND INTRAOCULAR LENS PLACEMENT (IOC);  Surgeon: Gemma Payor, MD;  Location: [facility];  Service: Ophthalmology;  Laterality: Left;  CDE 22.67       HPI  from the history and physical done on the day of admission:    [name] is an 82 y.o. male with hx of dementia, UC on sulfazalazine, essential tremors, anxiety hx of GIB, GERD, DM, known CAD, afib on Coumadin, brought to the ER as he was found confused, not recognizing his wife. He told her to leave the house, thinking she was a stranger. In the ER, he still maintained that she  was not his wife. However, he knows he is in [place], and knows basic information. He is trying to convince me that it was not his wife. He has no fever, chills, SOB, Chest pain, stiff neck or HA. Work up in the ER with CT head showing infarct of the frontal lobe of undetermined age, and old CVA of the medulla. He has hx of non obsructive left ICA stenosis, and has been followed by Dr Stephanie Acre of [facility]. EDP consulted with Dr Malachi Paradise, and he recommended at least an MRI of the brain. Hospitalist was asked to admit him for further work up.      Hospital Course:    1. Altered mental status/encephalopathy with memory deficit possibly secondary to an acute lacunar stroke. Apparently, the patient presented with confusion and the inability to recognize his wife. He was a question of stroke on the CT scan but per neurologist here this was likely an old stroke. Patient could not undergo MRI due to noncooperation, per neurology  his presentation was consistent with delirium and it will be treated as such with supportive care only.   He is already on Coumadin with therapeutic INR upon admission, INR slightly low today we will request PCP to repeat INR in a week, he seems to be close to his baseline at this time, neurology following. Neurology continue Coumadin. His A1c is 6 his LDL is less than 70.   Chronic tremor, possibly underlying Parkinson's. Neurology on board, post discharge he will follow-up with [name] inpatient outpatient.   Chronic atrial fibrillation on chronic Coumadin. With [place] VASC 2 score of greater than 4 Patient's INR on admission was therapeutic. He is treated with metoprolol for rate control. His rate is currently controlled. Will see monitoring Coumadin.  CAD. Currently stable. Continue statin and metoprolol for secondary prevention, was on Coumadin, not on long-term aspirin.  Ulcerated colitis. Currently stable on sulfasalazine.  Sick euthyroid. TSH of 5 upon admission. Stable free T4, outpatient repeat TSH along with free T4 and T3 in 3-4 weeks.    Diet-controlled type 2 diabetes mellitus. Currently stable and controlled on sliding scale NovoLog. A1c of 6. Monitor CBGs.  Incidental finding of chronic mild systolic heart failure with EF 45%, clinically compensated and carotid artery stenosis. Outpatient follow-up with cardiology and vascular surgery with age-appropriate outpatient management. Continue the patient on statin, Coumadin and beta blocker for secondary prevention. Clinically compensated from a CHF standpoint.      Discharge Condition: Fair  Follow UP  Follow-up Information    Follow up with Rudi Heap, MD. Schedule an appointment as soon as possible for a visit in 1 week.   Specialty:  Family Medicine   Contact information:   [address] [place] [address] [phone]       Follow up with [facility]. Schedule an appointment as soon  as possible for a visit in 2 weeks.   Specialty:  Neurology   Why:  CVA   Contact information:   [address] [place] [address] [phone]      Follow up with Gretta Began, MD. Schedule an appointment as soon as possible for a visit in 1 week.   Specialties:  Vascular Surgery, Cardiology   Why:  Carotid artery stenosis   Contact information:   [address] [place] [address] [phone]       Follow up with Laqueta Linden, MD. Schedule an appointment as soon as possible for a  visit in 1 week.   Specialty:  Cardiology   Contact information:   [address] [place] [place] [address] [phone]        Consults obtained - neurology  Diet and Activity recommendation: See Discharge Instructions below  Discharge Instructions           Discharge Instructions    Discharge instructions    Complete by:  As directed   Follow with Primary MD Rudi Heap, MD in 7 days   Get CBC, CMP, INR  checked  by Primary MD next visit.    Activity: As tolerated with Full fall precautions use walker/cane & assistance as needed   Disposition Home     Diet:   Heart Healthy  with feeding assistance and aspiration precautions.  For Heart failure patients - Check your Weight same time everyday, if you gain over 2 pounds, or you develop in leg swelling, experience more shortness of breath or chest pain, call your Primary MD immediately. Follow Cardiac Low Salt Diet and 1.5 lit/day fluid restriction.   On your next visit with your primary care physician please Get Medicines reviewed and adjusted.   Please request your Prim.MD to go over all Hospital Tests and Procedure/Radiological results at the follow up, please get all Hospital records sent to your Prim MD by signing hospital release before you go home.   If you experience worsening of your admission symptoms, develop shortness of breath, life threatening emergency, suicidal or homicidal thoughts  you must seek medical attention immediately by calling 911 or calling your MD immediately  if symptoms less severe.  You Must read complete instructions/literature along with all the possible adverse reactions/side effects for all the Medicines you take and that have been prescribed to you. Take any new Medicines after you have completely understood and accpet all the possible adverse reactions/side effects.   Do not drive, operating heavy machinery, perform activities at heights, swimming or participation in water activities or provide baby sitting services if your were admitted for syncope or siezures until you have seen by Primary MD or a Neurologist and advised to do so again.  Do not drive when taking Pain medications.    Do not take more than prescribed Pain, Sleep and Anxiety Medications  Special Instructions: If you have smoked or chewed Tobacco  in the last 2 yrs please stop smoking, stop any regular Alcohol  and or any Recreational drug use.  Wear Seat belts while driving.   Please note  You were cared for by a hospitalist during your hospital stay. If you have any questions about your discharge medications or the care you received while you were in the hospital after you are discharged, you can call the unit and asked to speak with the hospitalist on call if the hospitalist that took care of you is not available. Once you are discharged, your primary care physician will handle any further medical issues. Please note that NO REFILLS for any discharge medications will be authorized once you are discharged, as it is imperative that you return to your primary care physician (or establish a relationship with a primary care physician if you do not have one) for your aftercare needs so that they can reassess your need for medications and monitor your lab values.     Increase activity slowly    Complete by:  As directed              Discharge Medications  Medication List      TAKE these medications        atorvastatin 40 MG tablet  Commonly known as:  LIPITOR  TAKE ONE TABLET AT BEDTIME     digoxin 0.125 MG tablet  Commonly known as:  LANOXIN  TAKE 1 TABLET IN THE MORNING     metoprolol 100 MG tablet  Commonly known as:  LOPRESSOR  TAKE 1 & 1/2 TABLETS TWICE A DAY     mirabegron ER 25 MG Tb24 tablet  Commonly known as:  MYRBETRIQ  Take 1 tablet (25 mg total) by mouth daily.     multivitamin with minerals Tabs tablet  Take 1 tablet by mouth daily.     nitroGLYCERIN 0.4 MG SL tablet  Commonly known as:  NITROSTAT  Place 1 tablet (0.4 mg total) under the tongue every 5 (five) minutes as needed for chest pain.     PARoxetine 30 MG tablet  Commonly known as:  PAXIL  TAKE 1 TABLET IN THE MORNING     sulfaSALAzine 500 MG EC tablet  Commonly known as:  AZULFIDINE  Take 1 tablet (500 mg total) by mouth 2 (two) times daily. Take 500 mg twice a day by mouth for 1 week, then 1000 mg BID     vitamin B-12 1000 MCG tablet  Commonly known as:  CYANOCOBALAMIN  TAKE 1 TABLET IN THE MORNING     Vitamin D 1000 units capsule  Take 1 capsule (1,000 Units total) by mouth daily.     warfarin 5 MG tablet  Commonly known as:  COUMADIN  Takes as directed        Major procedures and Radiology Reports - PLEASE review detailed and final reports for all details, in brief -   TTE  - Left ventricle: The cavity size was normal. Systolic function was mildly to moderately reduced. The estimated ejection fraction was in the range of 40% to 45%. Akinesis of the mid-apicalanteroseptal and anterior myocardium. - Aortic valve: Valve area (Vmax): 1.64 cm^2. - Mitral valve: There was mild to moderate regurgitation. - Left atrium: The atrium was mildly dilated. - Right ventricle: The cavity size was mildly dilated. Wall thickness was normal. Systolic function was mildly reduced. - Right atrium: The atrium was mildly to moderately dilated. - Pulmonary arteries:  Systolic pressure was mildly increased. - Pericardium, extracardiac: A trivial pericardial effusion was identified.   Dg Chest 2 View  11/30/2015  CLINICAL DATA:  Altered mental status. History of coronary artery disease and hypertension. EXAM: CHEST  2 VIEW COMPARISON:  None. FINDINGS: The cardiac silhouette is mildly enlarged. Mediastinal contours appear intact. There is no evidence of focal airspace consolidation, pleural effusion or pneumothorax. Osseous structures are without acute abnormality. Chronic rotator cuff injury of the right shoulder is seen. Soft tissues are grossly normal. IMPRESSION: Mildly enlarged cardiac silhouette, otherwise no evidence of active cardiopulmonary disease. Electronically Signed   By: Ted Mcalpine M.D.   On: 11/30/2015 20:38   Ct Head Wo Contrast  11/30/2015  CLINICAL DATA:  Altered mental status. EXAM: CT HEAD WITHOUT CONTRAST TECHNIQUE: Contiguous axial images were obtained from the base of the skull through the vertex without intravenous contrast. COMPARISON:  None. FINDINGS: Brain: No evidence of intracranial hemorrhage, extra-axial collection, ventriculomegaly, or mass effect. There are moderate brain parenchymal atrophy and chronic small vessel disease changes. There is a more prominent hypoattenuated area in the deep periventricular white matter of the left frontal lobe, which may represent an age-indeterminate lacunar  infarction. Few mm area of encephalomalacia within the medulla likely represents prior lacunar infarct. Vascular: No hyperdense vessel or unexpected calcification. Skull: Negative for fracture or focal lesion. Sinuses/Orbits: No acute findings. Other: None. IMPRESSION: Moderate brain parenchymal atrophy and chronic microvascular disease. Somewhat more prominent hypoattenuated area in the deep periventricular white matter of the left frontal lobe may represent an age-indeterminate lacunar infarct. A few mm area of encephalomalacia within the  medulla likely represents a small remote lacunar infarct. Electronically Signed   By: Ted Mcalpine M.D.   On: 11/30/2015 20:47   US Carotid Bilateral  12/01/2015  CLINICAL DATA:  82 year old male with altered mental status EXAM: BILATERAL CAROTID DUPLEX ULTRASOUND TECHNIQUE: Wallace Cullens scale imaging, color Doppler and duplex ultrasound were performed of bilateral carotid and vertebral arteries in the neck. COMPARISON:  CT scan of the head 11/30/2015 FINDINGS: Criteria: Quantification of carotid stenosis is based on velocity parameters that correlate the residual internal carotid diameter with NASCET-based stenosis levels, using the diameter of the distal internal carotid lumen as the denominator for stenosis measurement. The following velocity measurements were obtained: RIGHT ICA:  143/15 cm/sec CCA:  116/7 cm/sec SYSTOLIC ICA/CCA RATIO:  1.2 DIASTOLIC ICA/CCA RATIO:  2.1 ECA:  156 cm/sec LEFT ICA:  93/22 cm/sec CCA:  116/14 cm/sec SYSTOLIC ICA/CCA RATIO:  0.8 DIASTOLIC ICA/CCA RATIO:  1.6 ECA:  115 cm/sec RIGHT CAROTID ARTERY: Mild heterogeneous atherosclerotic plaque scattered throughout the common carotid artery with irregularity in the region of the carotid bulb and proximal internal carotid artery. By peak systolic velocity criteria, the estimated stenosis falls into the 50- 69% diameter range. RIGHT VERTEBRAL ARTERY:  Patent with normal antegrade flow. LEFT CAROTID ARTERY: Heterogeneous atherosclerotic plaque in the proximal internal carotid artery. By peak systolic velocity criteria, the estimated stenosis remains less than 50%. LEFT VERTEBRAL ARTERY:  Patent with normal antegrade flow. IMPRESSION: 1. Moderate (50- 69%) stenosis proximal right internal carotid artery secondary to heterogeneous and mildly irregular atherosclerotic plaque. 2. Mild (1-49%) stenosis proximal left internal carotid artery secondary to heterogeneous atherosclerotic plaque. 3. Vertebral arteries are patent with normal antegrade  flow. Signed, Sterling Big, MD [facility]iology Electronically Signed   By: Malachy Moan M.D.   On: 12/01/2015 12:34    Micro Results      No results found for this or any previous visit (from the past 240 hour(s)).     Today   Subjective    [name] today has no headache,no chest abdominal pain,no new weakness tingling or numbness, feels much better wants to go home today.     Objective   Blood pressure 122/86, pulse 89, temperature 97.9 F (36.6 C), temperature source Oral, resp. rate 18, weight 72.576 kg (160 lb), SpO2 93 %.   Intake/Output Summary (Last 24 hours) at 12/03/15 1016 Last data filed at 12/03/15 0754  Gross per 24 hour  Intake    600 ml  Output    500 ml  Net    100 ml    Exam Awake Alert, Oriented x 3, No new F.N deficits, Normal affect, resting tremors in both upper extremities [place].AT,PERRAL Supple Neck,No JVD, No cervical lymphadenopathy appriciated.  Symmetrical Chest wall movement, Good air movement bilaterally, CTAB RRR,No Gallops,Rubs or new Murmurs, No Parasternal Heave +ve B.Sounds, Abd Soft, Non tender, No organomegaly appriciated, No rebound -guarding or rigidity. No Cyanosis, Clubbing or edema, No new Rash or bruise   Data Review   CBC w Diff:  Lab  Results  Component Value Date   WBC 7.4 12/02/2015   WBC 5.4 08/28/2015   WBC 6.2 04/15/2015   HGB 12.7* 12/02/2015   HGB 13.6* 04/15/2015   HCT 39.9 12/02/2015   HCT 41.8 08/28/2015   HCT 43.9 04/15/2015   PLT 171 12/02/2015   PLT 195 08/28/2015   LYMPHOPCT 26 11/30/2015   MONOPCT 10 11/30/2015   EOSPCT 3 11/30/2015   BASOPCT 1 11/30/2015    CMP:  Lab Results  Component Value Date   NA 135 12/02/2015   NA 135* 09/24/2015   K 4.1 12/02/2015   CL 96* 12/02/2015   CO2 33* 12/02/2015   BUN 11 12/02/2015   BUN 12 09/24/2015   CREATININE 0.83 12/02/2015   CREATININE 0.98 02/16/2013   PROT 6.8 11/30/2015    PROT 6.7 08/28/2015   ALBUMIN 3.6 11/30/2015   ALBUMIN 4.3 08/28/2015   BILITOT 0.7 11/30/2015   BILITOT 0.9 08/28/2015   ALKPHOS 70 11/30/2015   AST 25 11/30/2015   ALT 17 11/30/2015  .  Lab Results  Component Value Date   HGBA1C 6.2* 12/02/2015    Lab Results  Component Value Date   CHOL 98 12/01/2015   HDL 39* 12/01/2015   LDLCALC 52 12/01/2015   TRIG 35 12/01/2015   CHOLHDL 2.5 12/01/2015   Lab Results  Component Value Date   INR 1.77* 12/03/2015   INR 1.71* 12/02/2015   INR 2.30* 11/30/2015    Total Time in preparing paper work, data evaluation and todays exam - 35 minutes  Leroy SeaSINGH,PRASHANT K M.D on 12/03/2015 at 10:16 AM  [facility]   Office  [phone]

## 2015-12-04 ENCOUNTER — Telehealth: Payer: Self-pay | Admitting: *Deleted

## 2015-12-04 NOTE — Telephone Encounter (Signed)
Call Completed and Appointment Scheduled: Yes, Date: 12/10/15 Dr Silvestre MesiMoore   DISCHARGE INFORMATION Date of Discharge:12/03/15  Discharge Facility: [facility]  Principal Discharge Diagnosis: Altered Mental Status  Patient and/or caregiver is knowledgeable of his/her condition(s) and treatment: I spoke with Gregory RunnerKeith [name] who is the POA. He is fully aware of the conditions but I don't believe the patient and his wife are.   MEDICATION RECONCILIATION Medication list reviewed with patient: Yes  Patient is able to obtain needed medications: Yes- has some help with this   ACTIVITIES OF DAILY LIVING  Is the patient able to perform his/her own ADLs: Yes-for now. His ability to care for himself and his wife are declining. He does have some outside assistance.   Patient is receiving home health services: no   PATIENT EDUCATION Questions/Concerns Discussed:  Patient was discharged to home. He lives at home with his wife who has dementia. He has friends locally that look in on them and he has a POA, Gregory RunnerKeith [name], that is trying to coordinate appointments and looking into long term care for them both. They have had a conversation about long term care on several occassions and both Gregory Fickleaul and [name] are reluctant to leave their home. [name] is going to contact Social Services today to see if they qualify for Medicaid. Their income is very low and they have very little saved. If they qualify, Medicaid should help pay for long term care. [name] is going to tour [place] while in [place] on 12/10/15.  [name] needed several appointments scheduled. These have been made and [name] is aware. Phone numbers and locations of the offices provided. He may have to reschedule some to fit his schedule. He is also going to speak with Dr Christell ConstantMoore about whether he feels these appointments are necessary.  Cardiology-Dr Purvis SheffieldKoneswaran in [place] on 12/13/15 at 1:40 Neurology-Dr Anne HahnWillis in [place] on 12/11/15 at 12:00 Vascular-Dr Early in [place]  on 12/10/15 at 3:45.   A CBC, BMP, and INR is needed at his office visit here on 12/10/15. Noted in appointment notes.  A repeat TSH, free T3 &T4 needed in 3-4 weeks.   [name] will contact us if he needs any additional assistance.

## 2015-12-04 NOTE — Telephone Encounter (Signed)
I called and spoke to the patient's wife. Appointment scheduled 12/11/15.

## 2015-12-05 DIAGNOSIS — K219 Gastro-esophageal reflux disease without esophagitis: Secondary | ICD-10-CM | POA: Diagnosis not present

## 2015-12-05 DIAGNOSIS — I2510 Atherosclerotic heart disease of native coronary artery without angina pectoris: Secondary | ICD-10-CM | POA: Diagnosis not present

## 2015-12-05 DIAGNOSIS — I679 Cerebrovascular disease, unspecified: Secondary | ICD-10-CM | POA: Diagnosis not present

## 2015-12-05 DIAGNOSIS — G250 Essential tremor: Secondary | ICD-10-CM | POA: Diagnosis not present

## 2015-12-05 DIAGNOSIS — E785 Hyperlipidemia, unspecified: Secondary | ICD-10-CM | POA: Diagnosis not present

## 2015-12-05 DIAGNOSIS — I481 Persistent atrial fibrillation: Secondary | ICD-10-CM | POA: Diagnosis not present

## 2015-12-05 DIAGNOSIS — E119 Type 2 diabetes mellitus without complications: Secondary | ICD-10-CM | POA: Diagnosis not present

## 2015-12-05 DIAGNOSIS — I10 Essential (primary) hypertension: Secondary | ICD-10-CM | POA: Diagnosis not present

## 2015-12-06 DIAGNOSIS — I481 Persistent atrial fibrillation: Secondary | ICD-10-CM | POA: Diagnosis not present

## 2015-12-06 DIAGNOSIS — E119 Type 2 diabetes mellitus without complications: Secondary | ICD-10-CM | POA: Diagnosis not present

## 2015-12-06 DIAGNOSIS — I10 Essential (primary) hypertension: Secondary | ICD-10-CM | POA: Diagnosis not present

## 2015-12-06 DIAGNOSIS — E785 Hyperlipidemia, unspecified: Secondary | ICD-10-CM | POA: Diagnosis not present

## 2015-12-06 DIAGNOSIS — K219 Gastro-esophageal reflux disease without esophagitis: Secondary | ICD-10-CM | POA: Diagnosis not present

## 2015-12-06 DIAGNOSIS — G250 Essential tremor: Secondary | ICD-10-CM | POA: Diagnosis not present

## 2015-12-06 DIAGNOSIS — I2510 Atherosclerotic heart disease of native coronary artery without angina pectoris: Secondary | ICD-10-CM | POA: Diagnosis not present

## 2015-12-06 DIAGNOSIS — I679 Cerebrovascular disease, unspecified: Secondary | ICD-10-CM | POA: Diagnosis not present

## 2015-12-09 DIAGNOSIS — I679 Cerebrovascular disease, unspecified: Secondary | ICD-10-CM | POA: Diagnosis not present

## 2015-12-09 DIAGNOSIS — G250 Essential tremor: Secondary | ICD-10-CM | POA: Diagnosis not present

## 2015-12-09 DIAGNOSIS — E785 Hyperlipidemia, unspecified: Secondary | ICD-10-CM | POA: Diagnosis not present

## 2015-12-09 DIAGNOSIS — K219 Gastro-esophageal reflux disease without esophagitis: Secondary | ICD-10-CM | POA: Diagnosis not present

## 2015-12-09 DIAGNOSIS — E119 Type 2 diabetes mellitus without complications: Secondary | ICD-10-CM | POA: Diagnosis not present

## 2015-12-09 DIAGNOSIS — I481 Persistent atrial fibrillation: Secondary | ICD-10-CM | POA: Diagnosis not present

## 2015-12-09 DIAGNOSIS — I2510 Atherosclerotic heart disease of native coronary artery without angina pectoris: Secondary | ICD-10-CM | POA: Diagnosis not present

## 2015-12-09 DIAGNOSIS — I10 Essential (primary) hypertension: Secondary | ICD-10-CM | POA: Diagnosis not present

## 2015-12-10 ENCOUNTER — Ambulatory Visit: Payer: Medicare Other | Admitting: Vascular Surgery

## 2015-12-10 ENCOUNTER — Encounter: Payer: Self-pay | Admitting: Family Medicine

## 2015-12-10 ENCOUNTER — Ambulatory Visit (INDEPENDENT_AMBULATORY_CARE_PROVIDER_SITE_OTHER): Payer: Medicare Other | Admitting: Family Medicine

## 2015-12-10 ENCOUNTER — Encounter: Payer: Self-pay | Admitting: Vascular Surgery

## 2015-12-10 VITALS — BP 123/79 | HR 96 | Temp 98.3°F | Ht 69.0 in | Wt 150.0 lb

## 2015-12-10 DIAGNOSIS — I6523 Occlusion and stenosis of bilateral carotid arteries: Secondary | ICD-10-CM

## 2015-12-10 DIAGNOSIS — I2510 Atherosclerotic heart disease of native coronary artery without angina pectoris: Secondary | ICD-10-CM

## 2015-12-10 DIAGNOSIS — Z09 Encounter for follow-up examination after completed treatment for conditions other than malignant neoplasm: Secondary | ICD-10-CM | POA: Diagnosis not present

## 2015-12-10 DIAGNOSIS — G250 Essential tremor: Secondary | ICD-10-CM | POA: Diagnosis not present

## 2015-12-10 DIAGNOSIS — R5383 Other fatigue: Secondary | ICD-10-CM

## 2015-12-10 DIAGNOSIS — I481 Persistent atrial fibrillation: Secondary | ICD-10-CM

## 2015-12-10 DIAGNOSIS — R4182 Altered mental status, unspecified: Secondary | ICD-10-CM

## 2015-12-10 DIAGNOSIS — E119 Type 2 diabetes mellitus without complications: Secondary | ICD-10-CM

## 2015-12-10 DIAGNOSIS — I4819 Other persistent atrial fibrillation: Secondary | ICD-10-CM

## 2015-12-10 LAB — POCT INR: INR: 2

## 2015-12-10 NOTE — Progress Notes (Addendum)
Subjective:    Patient ID: [name], male    DOB: 06-25-1930, 82 y.o.   MRN: [id number]  HPI Patient here today for hospital / transitional care follow up. He was admitted to [facility] and discharged on 12/03/15 with the diagnosis of altered mental status. He is accompanied today by his wife and nephew. The nephew's name is Dionicia Abler and he is responsible for both his aunt and I'll call. The patient needs to have a CBC BMP and INR today. He also needs a thyroid panel in 3-4 weeks and this will be scheduled. On the day of admission it was noted that he ended up in the hospital and did not recognize his wife. The CT of his head showed:  Moderate brain parenchymal atrophy and chronic microvasculardisease.Somewhat more prominent hypoattenuated area in the deepperiventricular white matter of the left frontal lobe may representan age-indeterminate lacunar infarct.A few mm area of encephalomalacia within the medulla likely represents a small remote lacunar infarct. This was reviewed with the patient his wife and his caregiver today. Today the patient has no other complaints. He denies chest pain shortness of breath and trouble swallowing and heartburn indigestion nausea vomiting diarrhea or blood in the stool. He is not having problems passing his water. He does have his continued problems with tremor and has the appointment schedule with Dr. Jannifer Franklin, neurologist [facility]. He is also scheduled a cardiologist in evening but we will make sure that the appointment gets moved back to [facility] to see his routine cardiologist here. They have an appointment scheduled with vascular surgery but were not sure why that appointment was arranged and will try to look into that further. We are to contact Desiree Hane nephew with lab work and any pertinent information regarding the health of both of these people.        Patient Active Problem List   Diagnosis Date Noted  . Altered mental status 11/30/2015  . Type 2  diabetes, diet controlled ([place]) 06/28/2015  . Cerebrovascular disease 12/05/2014  . ASCVD (arteriosclerotic cardiovascular disease) 12/05/2014  . Persistent atrial fibrillation ([place]) 12/05/2014  . High risk medication use 06/22/2013  . Hypokalemia 06/22/2013  . Memory deficits 05/05/2013  . Essential tremor 05/05/2013  . Cerebrovascular disease, unspecified 01/16/2013  . CAD (coronary artery disease)   . BPH (benign prostatic hypertrophy) 02/11/2011  . Premature atrial complexes 02/11/2011  . Panic attack 02/11/2011  . Hyperlipidemia 02/12/2010  . Atrial fibrillation, persistent ([place]) 10/30/2009  . GERD 03/12/2009  . ULCERATIVE COLITIS-LEFT SIDE 03/12/2009   Outpatient Encounter Prescriptions as of 12/10/2015  Medication Sig  . atorvastatin (LIPITOR) 40 MG tablet TAKE ONE TABLET AT BEDTIME  . Cholecalciferol (VITAMIN D) 1000 UNITS capsule Take 1 capsule (1,000 Units total) by mouth daily.  . digoxin (LANOXIN) 0.125 MG tablet TAKE 1 TABLET IN THE MORNING  . metoprolol (LOPRESSOR) 100 MG tablet TAKE 1 & 1/2 TABLETS TWICE A DAY  . mirabegron ER (MYRBETRIQ) 25 MG TB24 tablet Take 1 tablet (25 mg total) by mouth daily.  . Multiple Vitamin (MULTIVITAMIN WITH MINERALS) TABS tablet Take 1 tablet by mouth daily.  . nitroGLYCERIN (NITROSTAT) 0.4 MG SL tablet Place 1 tablet (0.4 mg total) under the tongue every 5 (five) minutes as needed for chest pain.  Marland Kitchen PARoxetine (PAXIL) 30 MG tablet TAKE 1 TABLET IN THE MORNING  . sulfaSALAzine (AZULFIDINE) 500 MG EC tablet Take 1 tablet (500 mg total) by mouth 2 (two) times daily. Take 500 mg twice a day  by mouth for 1 week, then 1000 mg BID (Patient taking differently: Take 500 mg by mouth every morning. )  . vitamin B-12 (CYANOCOBALAMIN) 1000 MCG tablet TAKE 1 TABLET IN THE MORNING  . warfarin (COUMADIN) 5 MG tablet Takes as directed (Patient taking differently: take one tablet on Tuesdays and Thursdays, then take one-half tablet on all other days)   No  facility-administered encounter medications on file as of 12/10/2015.      Review of Systems  Constitutional: Negative.   HENT: Negative.   Eyes: Negative.   Respiratory: Negative.   Cardiovascular: Negative.   Gastrointestinal: Negative.   Endocrine: Negative.   Genitourinary: Negative.   Musculoskeletal: Negative.   Skin: Negative.   Allergic/Immunologic: Negative.   Neurological: Negative.   Hematological: Negative.   Psychiatric/Behavioral: Negative.        Objective:   Physical Exam  Constitutional: No distress.  The patient is thin and elderly and has a pill-rolling tremor in both hands. He knew his wife's name. He does not know my name today.  HENT:  Head: Normocephalic and atraumatic.  Right Ear: External ear normal.  Left Ear: External ear normal.  Mouth/Throat: Oropharynx is clear and moist. No oropharyngeal exudate.  Some congestion bilaterally  Eyes: Conjunctivae and EOM are normal. Pupils are equal, round, and reactive to light. Right eye exhibits no discharge. Left eye exhibits no discharge. No scleral icterus.  Neck: Normal range of motion. Neck supple. No thyromegaly present.  No bruits audible to me today.  Cardiovascular: Normal rate and normal heart sounds.   No murmur heard. The heart is irregular irregular at about 84/m  Pulmonary/Chest: Effort normal and breath sounds normal. No respiratory distress. He has no wheezes. He has no rales. He exhibits no tenderness.  Clear anteriorly and posteriorly  Abdominal: Soft. Bowel sounds are normal. He exhibits no mass. There is no tenderness. There is no rebound and no guarding.  Nontender without masses bruits or organ enlargement  Musculoskeletal: He exhibits no edema.  The patient has hesitant range of motion movements with walking and with his extremities. This is most likely due to his essential tremor.  Lymphadenopathy:    He has no cervical adenopathy.  Neurological:  The patient has declining memory  but this is apparently better than when he was recently admitted to the hospital.  Skin: Skin is warm and dry. No rash noted.  Psychiatric: He has a normal mood and affect. His behavior is normal.  There is a long discussion during the exam with the nephew the wife and the patient regarding what is the best health care for this couple. They're not entirely in agreement. The wife would prefer to stay at home if something happens to the husband and he ends up in a long-term care facility. She believes that there is a lady that will come and move in with her. The caregiver that came with him today says that this patient has cancer and has a job and he does not think she's will be willing to move in with the family. The bottom line is he is going to try to get the healthcare power of attorney for his hand from his uncle and he is going to try to get mad Medicaid for both of these people and they were all sent down and have a talk about their future care.  Nursing note and vitals reviewed.  BP 123/79 mmHg  Pulse 96  Temp(Src) 98.3 F (36.8 C) (Oral)  Ht 5'  9" (1.753 m)  Wt 150 lb (68.04 kg)  BMI 22.14 kg/m2        Assessment & Plan:  1. Persistent atrial fibrillation (HCC) -We will continue to adjust the patient's Coumadin based on the pro times. - POCT INR - BMP8+EGFR - CBC with Differential/Platelet  2. Hospital discharge follow-up - BMP8+EGFR - CBC with Differential/Platelet - Thyroid Panel With TSH; Future  3. Altered mental status, unspecified altered mental status type -The patient has moderate cerebral atrophy and microvascular calcifications as well as an essential tremor. -The patient has an appointment with his neurologist tomorrow and they will keep that appointment. - Thyroid Panel With TSH; Future  4. Type 2 diabetes, diet controlled ([place])  5. Essential tremor -Follow up with neurology as planned  6. ASCVD (arteriosclerotic cardiovascular disease) -Follow up with  cardiology as planned, this will be done with his regular cardiologist  7. Other fatigue - Thyroid Panel With TSH; Future  8. Bilateral carotid artery stenosis -We will discuss with vascular surgeon if a visit is needed or if repeat carotid Dopplers would be more appropriate at some point in the future. -I did discuss the case with the vascular surgeon, Dr. Sherren Mocha early and he thought that it would be appropriate just to repeat the carotid Dopplers in 1 year and this is what we will call the patient's responsible party with notifying them that we will repeat the carotid Dopplers in 1 year  Patient Instructions  We will arrange for a cardiology appointment to be here with his cardiologist in [facility] Keep appointment with Dr. Jannifer Franklin Please get healthcare power of attorney for Mrs. Butch taken care of Please make sure that Hassan Rowan is willing to continue providing this service is that she is providing for them at home and make sure that that her expectations are made aware of to corrine I will try to have a discussion with Dr. Tawni Millers regarding the need for a visit to see him because of the carotid Doppler report indicating 50-69% stenosis on the right internal carotid artery and up to 49% stenosis on the left. I will let that nephew no about this conversation once it occurs.   Arrie Senate MD

## 2015-12-10 NOTE — Patient Instructions (Addendum)
We will arrange for a cardiology appointment to be here with his cardiologist in [facility] Keep appointment with [name] Please get healthcare power of attorney for [name] taken care of Please make sure that Steward Drone is willing to continue providing this service is that she is providing for them at home and make sure that that her expectations are made aware of to corrine I will try to have a discussion with Dr. Karlene Lineman regarding the need for a visit to see him because of the carotid Doppler report indicating 50-69% stenosis on the right internal carotid artery and up to 49% stenosis on the left. I will let that nephew no about this conversation once it occurs.

## 2015-12-11 ENCOUNTER — Ambulatory Visit (INDEPENDENT_AMBULATORY_CARE_PROVIDER_SITE_OTHER): Payer: Medicare Other | Admitting: Neurology

## 2015-12-11 ENCOUNTER — Encounter: Payer: Self-pay | Admitting: Neurology

## 2015-12-11 VITALS — BP 109/71 | HR 90 | Ht 67.0 in | Wt 149.5 lb

## 2015-12-11 DIAGNOSIS — R4182 Altered mental status, unspecified: Secondary | ICD-10-CM

## 2015-12-11 DIAGNOSIS — R413 Other amnesia: Secondary | ICD-10-CM

## 2015-12-11 DIAGNOSIS — N39498 Other specified urinary incontinence: Secondary | ICD-10-CM

## 2015-12-11 DIAGNOSIS — G250 Essential tremor: Secondary | ICD-10-CM

## 2015-12-11 HISTORY — DX: Essential tremor: G25.0

## 2015-12-11 LAB — BMP8+EGFR
BUN/Creatinine Ratio: 14 (ref 10–22)
BUN: 10 mg/dL (ref 8–27)
CO2: 30 mmol/L — AB (ref 18–29)
CREATININE: 0.73 mg/dL — AB (ref 0.76–1.27)
Calcium: 8.9 mg/dL (ref 8.6–10.2)
Chloride: 91 mmol/L — ABNORMAL LOW (ref 96–106)
GFR calc Af Amer: 99 mL/min/{1.73_m2} (ref >59)
GFR calc non Af Amer: 86 mL/min/{1.73_m2} (ref >59)
GLUCOSE: 102 mg/dL — AB (ref 65–99)
Potassium: 4.2 mmol/L (ref 3.5–5.2)
Sodium: 134 mmol/L (ref 134–144)

## 2015-12-11 LAB — CBC WITH DIFFERENTIAL/PLATELET
Basophils Absolute: 0.1 10*3/uL (ref 0.0–0.2)
Basos: 1 %
EOS (ABSOLUTE): 0.1 10*3/uL (ref 0.0–0.4)
EOS: 1 %
HEMATOCRIT: 39.4 % (ref 37.5–51.0)
HEMOGLOBIN: 12.6 g/dL (ref 12.6–17.7)
IMMATURE GRANULOCYTES: 0 %
Immature Grans (Abs): 0 10*3/uL (ref 0.0–0.1)
Lymphocytes Absolute: 1.3 10*3/uL (ref 0.7–3.1)
Lymphs: 17 %
MCH: 30.6 pg (ref 26.6–33.0)
MCHC: 32 g/dL (ref 31.5–35.7)
MCV: 96 fL (ref 79–97)
MONOCYTES: 8 %
Monocytes Absolute: 0.6 10*3/uL (ref 0.1–0.9)
NEUTROS PCT: 73 %
Neutrophils Absolute: 5.5 10*3/uL (ref 1.4–7.0)
Platelets: 222 10*3/uL (ref 150–379)
RBC: 4.12 x10E6/uL — AB (ref 4.14–5.80)
RDW: 13.2 % (ref 12.3–15.4)
WBC: 7.5 10*3/uL (ref 3.4–10.8)

## 2015-12-11 NOTE — Patient Instructions (Signed)
Tremor °A tremor is trembling or shaking that you cannot control. Most tremors affect the hands or arms. Tremors can also affect the head, vocal cords, face, and other parts of the body. There are many types of tremors. Common types include:  °· Essential tremor. These usually occur in people over the age of 40. It may run in families and can happen in otherwise healthy people.   °· Resting tremor. These occur when the muscles are at rest, such as when your hands are resting in your lap. People with Parkinson disease often have resting tremors.   °· Postural tremor. These occur when you try to hold a pose, such as keeping your hands outstretched.   °· Kinetic tremor. These occur during purposeful movement, such as trying to touch a finger to your nose.   °· Task-specific tremor. These may occur when you perform tasks such as handwriting, speaking, or standing.   °· Psychogenic tremor. These dramatically lessen or disappear when you are distracted. They can happen in people of all ages.   °Some types of tremors have no known cause. Tremors can also be a symptom of nervous system problems (neurological disorders) that may occur with aging. Some tremors go away with treatment while others do not.  °HOME CARE INSTRUCTIONS °Watch your tremor for any changes. The following actions may help to lessen any discomfort you are feeling: °· Take medicines only as directed by your health care provider.   °· Limit alcohol intake to no more than 1 drink per day for nonpregnant women and 2 drinks per day for men. One drink equals 12 oz of beer, 5 oz of wine, or 1½ oz of hard liquor. °· Do not use any tobacco products, including cigarettes, chewing tobacco, or electronic cigarettes. If you need help quitting, ask your health care provider.   °· Avoid extreme heat or cold.    °· Limit the amount of caffeine you consume as directed by your health care provider.   °· Try to get 8 hours of sleep each night. °· Find ways to manage your  stress, such as meditation or yoga. °· Keep all follow-up visits as directed by your health care provider. This is important. °SEEK MEDICAL CARE IF: °· You start having a tremor after starting a new medicine. °· You have tremor with other symptoms such as: °¨ Numbness. °¨ Tingling. °¨ Pain. °¨ Weakness. °· Your tremor gets worse. °· Your tremor interferes with your day-to-day life. °  °This information is not intended to replace advice given to you by your health care provider. Make sure you discuss any questions you have with your health care provider. °  °Document Released: 10/30/2002 Document Revised: 11/30/2014 Document Reviewed: 05/07/2014 °Elsevier Interactive Patient Education ©2016 [employer] Inc. ° °

## 2015-12-11 NOTE — Progress Notes (Signed)
Reason for visit: transient confusion  [name] is an 82 y.o. male  History of present illness:  [name] is an 82 year old left-handed white male with a history of essential tremors affecting all 4 extremities. The patient has also developed a progressive memory disorder, and he has a mild gait disorder. The patient has had episodes of transient aphasia in the past, workup has included an EEG study that showed some left-sided slowing, no definite epileptiform activity. The patient indicates that he has not had any similar episodes. The patient went into the hospital recently on 11/30/2015 with onset of confusion. The etiology of the confusion was never determined. The patient had a CT scan of the brain that did not show any definite new ischemic events. The patient is on Coumadin for atrial fibrillation. The confusion resolved, the patient has gotten back to baseline. He remains somewhat demented. He reports no numbness or weakness of the face, arms, or legs. He is still able to ambulate without a cane or a walker. He is sent to this office for further evaluation.  Past Medical History  Diagnosis Date  . Dyslipidemia   . Anxiety   . GIB (gastrointestinal bleeding)   . Obesity   . UC (ulcerative colitis confined to rectum) (HCC)     Left-sided  . BPH (benign prostatic hyperplasia)   . Diverticulosis   . GERD (gastroesophageal reflux disease)   . Hemorrhoids   . Diabetes mellitus   . Tremor   . CAD (coronary artery disease)     Anterior MI and DES stenting 2004, Vfib arrest; left main 25% stenosis, LAD 80% stenosis, 99% stenosis, 25% circumflex stenosis, 25% ramus intermedius stenosis, 50-40% right coronary artery stenosis, 90% RV branch stenosis with right to left collaterals; EF 55% with mild naterior hypokinesis (underwent Taxus stenting at the time) . Stress perfusion study January 2012 EF 48% with old apical infarct  . Hyperlipidemia   . Hyperplastic colon polyp   .  Myocardial infarction (HCC)   . Hypertension   . Stroke (HCC)   . Memory deficits 05/05/2013  . Essential and other specified forms of tremor 05/05/2013  . Hearing deficit     Wears hearing aids  . Cataract   . Atrial fibrillation, persistent (HCC)   . Tremor, essential 12/11/2015    Past Surgical History  Procedure Laterality Date  . Hammer toe surgery      right  . Inguinal hernia repair      right, bilateral  . Coronary angioplasty with stent placement      Taxus stenting  . Cataract extraction w/phaco  10/27/2012    Procedure: CATARACT EXTRACTION PHACO AND INTRAOCULAR LENS PLACEMENT (I[facility]);  Surgeon: Gemma Payor, MD;  Location: [facility];  Service: Ophthalmology;  Laterality: Left;  CDE 22.67    Family History  Problem Relation Age of Onset  . Colon cancer Neg Hx   . Alzheimer's disease Brother   . Diabetes Brother     x 4  . Diabetes Mother     ?  . Diabetes Father   . Heart disease Father   . Heart disease Paternal Grandfather   . Heart disease Brother     x 3  . COPD Sister   . Diabetes Sister     Social history:  reports that he quit smoking about 57 years ago. He has quit using smokeless tobacco. His smokeless tobacco use included Chew. He reports that he does not drink alcohol or use  illicit drugs.    Allergies  Allergen Reactions  . Asa-Apap-Salicyl-Caff     Takes warfarin  . Codeine Other (See Comments)    Unknown reaction  . Nsaids Other (See Comments)    On warfarin  . Sulfonamide Derivatives Nausea And Vomiting    Medications:  Prior to Admission medications   Medication Sig Start Date End Date Taking? Authorizing Provider  atorvastatin (LIPITOR) 40 MG tablet TAKE ONE TABLET AT BEDTIME 10/04/15  Yes [name], MD  Cholecalciferol (VITAMIN D) 1000 UNITS capsule Take 1 capsule (1,000 Units total) by mouth daily. 06/26/13  Yes Tammy Eckard, PHARMD  digoxin (LANOXIN) 0.125 MG tablet TAKE 1 TABLET IN THE MORNING 10/08/15  Yes [name], MD    metoprolol (LOPRESSOR) 100 MG tablet TAKE 1 & 1/2 TABLETS TWICE A DAY 11/12/15  Yes [name], MD  mirabegron ER (MYRBETRIQ) 25 MG TB24 tablet Take 1 tablet (25 mg total) by mouth daily. 09/18/15  Yes Tammy Eckard, PHARMD  Multiple Vitamin (MULTIVITAMIN WITH MINERALS) TABS tablet Take 1 tablet by mouth daily. 08/28/15  Yes [name], MD  nitroGLYCERIN (NITROSTAT) 0.4 MG SL tablet Place 1 tablet (0.4 mg total) under the tongue every 5 (five) minutes as needed for chest pain. 10/08/14  Yes [name], MD  PARoxetine (PAXIL) 30 MG tablet TAKE 1 TABLET IN THE MORNING 12/03/15  Yes [name], MD  sulfaSALAzine (AZULFIDINE) 500 MG EC tablet Take 1 tablet (500 mg total) by mouth 2 (two) times daily. Take 500 mg twice a day by mouth for 1 week, then 1000 mg BID Patient taking differently: Take 500 mg by mouth every morning.  11/02/15  Yes [name], MD  vitamin B-12 (CYANOCOBALAMIN) 1000 MCG tablet TAKE 1 TABLET IN THE MORNING 10/08/15  Yes [name], MD  warfarin (COUMADIN) 5 MG tablet Takes as directed Patient taking differently: take one tablet on Tuesdays and Thursdays, then take one-half tablet on all other days 11/01/15  Yes [name], MD    ROS:  Out of a complete 14 system review of symptoms, the patient complains only of the following symptoms, and all other reviewed systems are negative.  Hearing loss, ringing in the ears Walking difficulty Memory loss, confusion  Blood pressure 109/71, pulse 90, height  (1.702 m), weight 149 lb 8 oz (67.813 kg).  Physical Exam  General: The patient is alert and cooperative at the time of the examination.  Skin: No significant peripheral edema is noted.   Neurologic Exam  Mental status: The patient is alert and oriented x 2 at the time of the examination (not oriented to date). The Mini-Mental status examination done today shows a total score of 14/30. The patient is able to name 3 animals in 30  seconds..   Cranial nerves: Facial symmetry is present. Speech is normal, no aphasia or dysarthria is noted. Extraocular movements are full. Visual fields are full. The patient is quite hard of hearing.  Motor: The patient has good strength in all 4 extremities.  Sensory examination: Soft touch sensation is symmetric on the face, arms, and legs.  Coordination: The patient has good finger-nose-finger and heel-to-shin bilaterally. Prominent tremors are seen on all 4 extremities. The patient has significant apraxia with the use of the extremities.  Gait and station: The patient has the ability to ambulate independently, slightly wide-based gait. Tandem gait was not attempted. Romberg is negative.  Reflexes: Deep tendon reflexes are symmetric.  CT head 11/30/15:  IMPRESSION: Moderate brain parenchymal atrophy and chronic microvascular disease.  Somewhat more prominent hypoattenuated area in the deep periventricular white matter of the left frontal lobe may represent an age-indeterminate lacunar infarct.  A few mm area of encephalomalacia within the medulla likely represents a small remote lacunar infarct.  * CT scan images were reviewed online. I agree with the written report.   Carotid doppler 12/01/15:  IMPRESSION: 1. Moderate (50- 69%) stenosis proximal right internal carotid artery secondary to heterogeneous and mildly irregular atherosclerotic plaque. 2. Mild (1-49%) stenosis proximal left internal carotid artery secondary to heterogeneous atherosclerotic plaque. 3. Vertebral arteries are patent with normal antegrade flow. Signed,    Assessment/Plan:  1. Essential tremor  2. Progressive memory disturbance  3. Transient altered mental status  The episode of confusion in the hospital was of unclear etiology. The patient has a baseline dementia, he has had episodes previously of transient aphasia. He remains on anticoagulation. He will be set up for repeat EEG study.  The patient reports frequent episodes of nocturia, he is on Myrbetriq without benefit. I will refer him to a urology physician. He will follow-up in several months.  Marlan Palau MD 12/11/2015 7:38 PM  [facility] [address] [place], [place] [address]  Phone [phone] Fax [phone]

## 2015-12-12 DIAGNOSIS — I481 Persistent atrial fibrillation: Secondary | ICD-10-CM | POA: Diagnosis not present

## 2015-12-12 DIAGNOSIS — G250 Essential tremor: Secondary | ICD-10-CM | POA: Diagnosis not present

## 2015-12-12 DIAGNOSIS — K219 Gastro-esophageal reflux disease without esophagitis: Secondary | ICD-10-CM | POA: Diagnosis not present

## 2015-12-12 DIAGNOSIS — E119 Type 2 diabetes mellitus without complications: Secondary | ICD-10-CM | POA: Diagnosis not present

## 2015-12-12 DIAGNOSIS — I10 Essential (primary) hypertension: Secondary | ICD-10-CM | POA: Diagnosis not present

## 2015-12-12 DIAGNOSIS — I679 Cerebrovascular disease, unspecified: Secondary | ICD-10-CM | POA: Diagnosis not present

## 2015-12-12 DIAGNOSIS — E785 Hyperlipidemia, unspecified: Secondary | ICD-10-CM | POA: Diagnosis not present

## 2015-12-12 DIAGNOSIS — I2510 Atherosclerotic heart disease of native coronary artery without angina pectoris: Secondary | ICD-10-CM | POA: Diagnosis not present

## 2015-12-13 ENCOUNTER — Encounter: Payer: Medicare Other | Admitting: Cardiovascular Disease

## 2015-12-13 DIAGNOSIS — E785 Hyperlipidemia, unspecified: Secondary | ICD-10-CM | POA: Diagnosis not present

## 2015-12-13 DIAGNOSIS — K219 Gastro-esophageal reflux disease without esophagitis: Secondary | ICD-10-CM | POA: Diagnosis not present

## 2015-12-13 DIAGNOSIS — I679 Cerebrovascular disease, unspecified: Secondary | ICD-10-CM | POA: Diagnosis not present

## 2015-12-13 DIAGNOSIS — I481 Persistent atrial fibrillation: Secondary | ICD-10-CM | POA: Diagnosis not present

## 2015-12-13 DIAGNOSIS — G250 Essential tremor: Secondary | ICD-10-CM | POA: Diagnosis not present

## 2015-12-13 DIAGNOSIS — I2510 Atherosclerotic heart disease of native coronary artery without angina pectoris: Secondary | ICD-10-CM | POA: Diagnosis not present

## 2015-12-13 DIAGNOSIS — I10 Essential (primary) hypertension: Secondary | ICD-10-CM | POA: Diagnosis not present

## 2015-12-13 DIAGNOSIS — E119 Type 2 diabetes mellitus without complications: Secondary | ICD-10-CM | POA: Diagnosis not present

## 2015-12-14 DIAGNOSIS — I2510 Atherosclerotic heart disease of native coronary artery without angina pectoris: Secondary | ICD-10-CM | POA: Diagnosis not present

## 2015-12-14 DIAGNOSIS — I481 Persistent atrial fibrillation: Secondary | ICD-10-CM | POA: Diagnosis not present

## 2015-12-14 DIAGNOSIS — I10 Essential (primary) hypertension: Secondary | ICD-10-CM | POA: Diagnosis not present

## 2015-12-14 DIAGNOSIS — G250 Essential tremor: Secondary | ICD-10-CM | POA: Diagnosis not present

## 2015-12-14 DIAGNOSIS — K219 Gastro-esophageal reflux disease without esophagitis: Secondary | ICD-10-CM | POA: Diagnosis not present

## 2015-12-14 DIAGNOSIS — E119 Type 2 diabetes mellitus without complications: Secondary | ICD-10-CM | POA: Diagnosis not present

## 2015-12-14 DIAGNOSIS — E785 Hyperlipidemia, unspecified: Secondary | ICD-10-CM | POA: Diagnosis not present

## 2015-12-14 DIAGNOSIS — I679 Cerebrovascular disease, unspecified: Secondary | ICD-10-CM | POA: Diagnosis not present

## 2015-12-16 DIAGNOSIS — G250 Essential tremor: Secondary | ICD-10-CM | POA: Diagnosis not present

## 2015-12-16 DIAGNOSIS — I2510 Atherosclerotic heart disease of native coronary artery without angina pectoris: Secondary | ICD-10-CM | POA: Diagnosis not present

## 2015-12-16 DIAGNOSIS — I679 Cerebrovascular disease, unspecified: Secondary | ICD-10-CM | POA: Diagnosis not present

## 2015-12-16 DIAGNOSIS — K219 Gastro-esophageal reflux disease without esophagitis: Secondary | ICD-10-CM | POA: Diagnosis not present

## 2015-12-16 DIAGNOSIS — I481 Persistent atrial fibrillation: Secondary | ICD-10-CM | POA: Diagnosis not present

## 2015-12-16 DIAGNOSIS — I10 Essential (primary) hypertension: Secondary | ICD-10-CM | POA: Diagnosis not present

## 2015-12-16 DIAGNOSIS — E119 Type 2 diabetes mellitus without complications: Secondary | ICD-10-CM | POA: Diagnosis not present

## 2015-12-16 DIAGNOSIS — E785 Hyperlipidemia, unspecified: Secondary | ICD-10-CM | POA: Diagnosis not present

## 2015-12-17 ENCOUNTER — Ambulatory Visit: Payer: Medicare Other | Admitting: Vascular Surgery

## 2015-12-18 DIAGNOSIS — I481 Persistent atrial fibrillation: Secondary | ICD-10-CM | POA: Diagnosis not present

## 2015-12-18 DIAGNOSIS — K219 Gastro-esophageal reflux disease without esophagitis: Secondary | ICD-10-CM | POA: Diagnosis not present

## 2015-12-18 DIAGNOSIS — I679 Cerebrovascular disease, unspecified: Secondary | ICD-10-CM | POA: Diagnosis not present

## 2015-12-18 DIAGNOSIS — E785 Hyperlipidemia, unspecified: Secondary | ICD-10-CM | POA: Diagnosis not present

## 2015-12-18 DIAGNOSIS — I10 Essential (primary) hypertension: Secondary | ICD-10-CM | POA: Diagnosis not present

## 2015-12-18 DIAGNOSIS — G250 Essential tremor: Secondary | ICD-10-CM | POA: Diagnosis not present

## 2015-12-18 DIAGNOSIS — I2510 Atherosclerotic heart disease of native coronary artery without angina pectoris: Secondary | ICD-10-CM | POA: Diagnosis not present

## 2015-12-18 DIAGNOSIS — E119 Type 2 diabetes mellitus without complications: Secondary | ICD-10-CM | POA: Diagnosis not present

## 2015-12-19 ENCOUNTER — Encounter: Payer: Medicare Other | Admitting: Cardiovascular Disease

## 2015-12-19 DIAGNOSIS — K219 Gastro-esophageal reflux disease without esophagitis: Secondary | ICD-10-CM | POA: Diagnosis not present

## 2015-12-19 DIAGNOSIS — I679 Cerebrovascular disease, unspecified: Secondary | ICD-10-CM | POA: Diagnosis not present

## 2015-12-19 DIAGNOSIS — I10 Essential (primary) hypertension: Secondary | ICD-10-CM | POA: Diagnosis not present

## 2015-12-19 DIAGNOSIS — I481 Persistent atrial fibrillation: Secondary | ICD-10-CM | POA: Diagnosis not present

## 2015-12-19 DIAGNOSIS — E119 Type 2 diabetes mellitus without complications: Secondary | ICD-10-CM | POA: Diagnosis not present

## 2015-12-19 DIAGNOSIS — I2510 Atherosclerotic heart disease of native coronary artery without angina pectoris: Secondary | ICD-10-CM | POA: Diagnosis not present

## 2015-12-19 DIAGNOSIS — G250 Essential tremor: Secondary | ICD-10-CM | POA: Diagnosis not present

## 2015-12-19 DIAGNOSIS — E785 Hyperlipidemia, unspecified: Secondary | ICD-10-CM | POA: Diagnosis not present

## 2015-12-24 ENCOUNTER — Encounter (INDEPENDENT_AMBULATORY_CARE_PROVIDER_SITE_OTHER): Payer: Medicare Other | Admitting: Family Medicine

## 2015-12-24 DIAGNOSIS — G250 Essential tremor: Secondary | ICD-10-CM

## 2015-12-24 DIAGNOSIS — E119 Type 2 diabetes mellitus without complications: Secondary | ICD-10-CM

## 2015-12-24 DIAGNOSIS — I481 Persistent atrial fibrillation: Secondary | ICD-10-CM

## 2015-12-24 DIAGNOSIS — I679 Cerebrovascular disease, unspecified: Secondary | ICD-10-CM

## 2016-01-02 ENCOUNTER — Telehealth: Payer: Self-pay | Admitting: Family Medicine

## 2016-01-02 NOTE — Telephone Encounter (Signed)
Atarax or hydroxyzine 10 mg one half - 1 daily if needed #15

## 2016-01-03 MED ORDER — HYDROXYZINE HCL 10 MG PO TABS
ORAL_TABLET | ORAL | Status: DC
Start: 2016-01-03 — End: 2016-09-12

## 2016-01-03 NOTE — Telephone Encounter (Signed)
Spoke with Mellody Dance and informed him we have sent in Atarax to [facility] to be used as needed.

## 2016-01-06 ENCOUNTER — Other Ambulatory Visit: Payer: Self-pay | Admitting: Family Medicine

## 2016-01-07 ENCOUNTER — Telehealth: Payer: Self-pay | Admitting: Neurology

## 2016-01-07 ENCOUNTER — Ambulatory Visit (INDEPENDENT_AMBULATORY_CARE_PROVIDER_SITE_OTHER): Payer: Medicare Other | Admitting: Neurology

## 2016-01-07 DIAGNOSIS — R4701 Aphasia: Secondary | ICD-10-CM

## 2016-01-07 DIAGNOSIS — G250 Essential tremor: Secondary | ICD-10-CM

## 2016-01-07 DIAGNOSIS — R4182 Altered mental status, unspecified: Secondary | ICD-10-CM

## 2016-01-07 DIAGNOSIS — R413 Other amnesia: Secondary | ICD-10-CM

## 2016-01-07 NOTE — Procedures (Signed)
     History: [name] is an 82 year old gentleman with a history of a dementia, and a history of cerebrovascular disease. He has atrial flutter on anticoagulant therapy. He is being evaluated for episodes of transient aphasia.  This is a routine EEG. No skull defects are noted. Medications include Lipitor, vitamin D, digoxin, hydroxyzine, Lopressor, Myrbetriq, multivitamins, Paxil, sulfasalazine, vitamin B12, and Coumadin.  EEG classification: Dysrhythmia grade 1 generalized  Description of the recording: The background rhythms of this recording consists of a 7 Hz medium amplitude theta frequency activity that is reactive. As the record progresses, hyperventilation was not performed, photic stimulation is performed and this results in a minimal but bilateral photic driving response. EKG artifact is prominent throughout the recording. At no time during the recording does there appear to be evidence of spike or spike-wave discharges or evidence of definite focal slowing. EKG monitor appears to show episodes of quite rapid heart rate, with rates up to around 240.  Impression: This is an abnormal EEG recording secondary to diffuse background slowing. This is a nonspecific recording, and can be seen with any process that results in a mild metabolic or toxic encephalopathy, or any dementing illness. No clear epileptiform discharges are seen. Interpretation of the EKG is difficult, the heart rates appear to be quite rapid, but the patient does have a history of atrial flutter. Formal 12-lead EKG evaluation may offer more specific information.

## 2016-01-07 NOTE — Telephone Encounter (Signed)
I called [name], [place] for the patient. [phone]. The EKG shows nonspecific slowing that could be consistent with a history of dementia. The heart rates appear to be up around 240, but the patient does have atrial flutter, upon review of prior EKG evaluations, similar activity seen that is associated with the atrial flutter, not related actual heart rate. The EEG monitor of the heart is not adequate to fully discern what the actual heart rate is. I suspect what is shown on the heart monitor is actually in part the atrial flutter, not the actual ventricular rate.

## 2016-01-09 ENCOUNTER — Telehealth: Payer: Self-pay

## 2016-01-09 NOTE — Telephone Encounter (Signed)
Thought you were going to do a referral for cardiologist next door?

## 2016-01-09 NOTE — Telephone Encounter (Signed)
Get appointment with cardiology as soon as possible next door for follow-up

## 2016-01-10 ENCOUNTER — Emergency Department (HOSPITAL_COMMUNITY): Payer: Medicare Other

## 2016-01-10 ENCOUNTER — Inpatient Hospital Stay (HOSPITAL_COMMUNITY)
Admission: EM | Admit: 2016-01-10 | Discharge: 2016-01-14 | DRG: 641 | Disposition: A | Discharge status: Skilled Nursing Facility | Payer: Medicare Other | Attending: Internal Medicine | Admitting: Internal Medicine

## 2016-01-10 ENCOUNTER — Encounter (HOSPITAL_COMMUNITY): Payer: Self-pay | Admitting: Emergency Medicine

## 2016-01-10 DIAGNOSIS — Z825 Family history of asthma and other chronic lower respiratory diseases: Secondary | ICD-10-CM

## 2016-01-10 DIAGNOSIS — Z833 Family history of diabetes mellitus: Secondary | ICD-10-CM

## 2016-01-10 DIAGNOSIS — E871 Hypo-osmolality and hyponatremia: Principal | ICD-10-CM | POA: Diagnosis present

## 2016-01-10 DIAGNOSIS — I10 Essential (primary) hypertension: Secondary | ICD-10-CM | POA: Diagnosis not present

## 2016-01-10 DIAGNOSIS — R2681 Unsteadiness on feet: Secondary | ICD-10-CM | POA: Diagnosis not present

## 2016-01-10 DIAGNOSIS — Z955 Presence of coronary angioplasty implant and graft: Secondary | ICD-10-CM | POA: Diagnosis not present

## 2016-01-10 DIAGNOSIS — Z8673 Personal history of transient ischemic attack (TIA), and cerebral infarction without residual deficits: Secondary | ICD-10-CM

## 2016-01-10 DIAGNOSIS — I481 Persistent atrial fibrillation: Secondary | ICD-10-CM | POA: Diagnosis present

## 2016-01-10 DIAGNOSIS — Z820 Family history of epilepsy and other diseases of the nervous system: Secondary | ICD-10-CM

## 2016-01-10 DIAGNOSIS — R450 Nervousness: Secondary | ICD-10-CM | POA: Diagnosis not present

## 2016-01-10 DIAGNOSIS — F419 Anxiety disorder, unspecified: Secondary | ICD-10-CM | POA: Diagnosis present

## 2016-01-10 DIAGNOSIS — I252 Old myocardial infarction: Secondary | ICD-10-CM

## 2016-01-10 DIAGNOSIS — E785 Hyperlipidemia, unspecified: Secondary | ICD-10-CM | POA: Diagnosis not present

## 2016-01-10 DIAGNOSIS — Z66 Do not resuscitate: Secondary | ICD-10-CM | POA: Diagnosis not present

## 2016-01-10 DIAGNOSIS — Z87891 Personal history of nicotine dependence: Secondary | ICD-10-CM | POA: Diagnosis not present

## 2016-01-10 DIAGNOSIS — I679 Cerebrovascular disease, unspecified: Secondary | ICD-10-CM | POA: Diagnosis not present

## 2016-01-10 DIAGNOSIS — G250 Essential tremor: Secondary | ICD-10-CM | POA: Diagnosis present

## 2016-01-10 DIAGNOSIS — F23 Brief psychotic disorder: Secondary | ICD-10-CM | POA: Diagnosis not present

## 2016-01-10 DIAGNOSIS — Z8249 Family history of ischemic heart disease and other diseases of the circulatory system: Secondary | ICD-10-CM | POA: Diagnosis not present

## 2016-01-10 DIAGNOSIS — Z7901 Long term (current) use of anticoagulants: Secondary | ICD-10-CM

## 2016-01-10 DIAGNOSIS — M6281 Muscle weakness (generalized): Secondary | ICD-10-CM | POA: Diagnosis not present

## 2016-01-10 DIAGNOSIS — E876 Hypokalemia: Secondary | ICD-10-CM | POA: Diagnosis not present

## 2016-01-10 DIAGNOSIS — E861 Hypovolemia: Secondary | ICD-10-CM | POA: Diagnosis not present

## 2016-01-10 DIAGNOSIS — I491 Atrial premature depolarization: Secondary | ICD-10-CM | POA: Diagnosis not present

## 2016-01-10 DIAGNOSIS — K5150 Left sided colitis without complications: Secondary | ICD-10-CM | POA: Diagnosis not present

## 2016-01-10 DIAGNOSIS — Y92008 Other place in unspecified non-institutional (private) residence as the place of occurrence of the external cause: Secondary | ICD-10-CM | POA: Diagnosis not present

## 2016-01-10 DIAGNOSIS — R1310 Dysphagia, unspecified: Secondary | ICD-10-CM | POA: Diagnosis not present

## 2016-01-10 DIAGNOSIS — N400 Enlarged prostate without lower urinary tract symptoms: Secondary | ICD-10-CM | POA: Diagnosis not present

## 2016-01-10 DIAGNOSIS — I2510 Atherosclerotic heart disease of native coronary artery without angina pectoris: Secondary | ICD-10-CM | POA: Diagnosis present

## 2016-01-10 DIAGNOSIS — E119 Type 2 diabetes mellitus without complications: Secondary | ICD-10-CM | POA: Diagnosis present

## 2016-01-10 DIAGNOSIS — Z9181 History of falling: Secondary | ICD-10-CM | POA: Diagnosis not present

## 2016-01-10 DIAGNOSIS — M25562 Pain in left knee: Secondary | ICD-10-CM | POA: Diagnosis not present

## 2016-01-10 DIAGNOSIS — K219 Gastro-esophageal reflux disease without esophagitis: Secondary | ICD-10-CM | POA: Diagnosis present

## 2016-01-10 DIAGNOSIS — I4891 Unspecified atrial fibrillation: Secondary | ICD-10-CM | POA: Diagnosis not present

## 2016-01-10 DIAGNOSIS — G20 Parkinson's disease: Secondary | ICD-10-CM | POA: Diagnosis not present

## 2016-01-10 DIAGNOSIS — R413 Other amnesia: Secondary | ICD-10-CM | POA: Diagnosis present

## 2016-01-10 DIAGNOSIS — R69 Illness, unspecified: Secondary | ICD-10-CM | POA: Diagnosis not present

## 2016-01-10 DIAGNOSIS — F0280 Dementia in other diseases classified elsewhere without behavioral disturbance: Secondary | ICD-10-CM | POA: Diagnosis present

## 2016-01-10 DIAGNOSIS — W19XXXA Unspecified fall, initial encounter: Secondary | ICD-10-CM | POA: Diagnosis present

## 2016-01-10 DIAGNOSIS — W19XXXD Unspecified fall, subsequent encounter: Secondary | ICD-10-CM | POA: Diagnosis not present

## 2016-01-10 DIAGNOSIS — F99 Mental disorder, not otherwise specified: Secondary | ICD-10-CM | POA: Diagnosis not present

## 2016-01-10 DIAGNOSIS — Z7401 Bed confinement status: Secondary | ICD-10-CM | POA: Diagnosis not present

## 2016-01-10 DIAGNOSIS — R279 Unspecified lack of coordination: Secondary | ICD-10-CM | POA: Diagnosis not present

## 2016-01-10 DIAGNOSIS — S0990XA Unspecified injury of head, initial encounter: Secondary | ICD-10-CM | POA: Diagnosis not present

## 2016-01-10 DIAGNOSIS — I4819 Other persistent atrial fibrillation: Secondary | ICD-10-CM | POA: Diagnosis present

## 2016-01-10 LAB — CBG MONITORING, ED: Glucose-Capillary: 96 mg/dL (ref 65–99)

## 2016-01-10 LAB — COMPREHENSIVE METABOLIC PANEL
ALBUMIN: 3.8 g/dL (ref 3.5–5.0)
ALK PHOS: 64 U/L (ref 38–126)
ALT: 17 U/L (ref 17–63)
AST: 25 U/L (ref 15–41)
Anion gap: 6 (ref 5–15)
BUN: 9 mg/dL (ref 6–20)
CALCIUM: 8.6 mg/dL — AB (ref 8.9–10.3)
CO2: 33 mmol/L — AB (ref 22–32)
CREATININE: 0.78 mg/dL (ref 0.61–1.24)
Chloride: 88 mmol/L — ABNORMAL LOW (ref 101–111)
GFR calc Af Amer: >60 mL/min (ref >60)
GFR calc non Af Amer: >60 mL/min (ref >60)
GLUCOSE: 110 mg/dL — AB (ref 65–99)
Potassium: 4.2 mmol/L (ref 3.5–5.1)
SODIUM: 127 mmol/L — AB (ref 135–145)
Total Bilirubin: 1.1 mg/dL (ref 0.3–1.2)
Total Protein: 6.7 g/dL (ref 6.5–8.1)

## 2016-01-10 LAB — URINALYSIS, ROUTINE W REFLEX MICROSCOPIC
Bilirubin Urine: NEGATIVE
Glucose, UA: NEGATIVE mg/dL
Hgb urine dipstick: NEGATIVE
KETONES UR: NEGATIVE mg/dL
LEUKOCYTES UA: NEGATIVE
NITRITE: NEGATIVE
PH: 8 (ref 5.0–8.0)
PROTEIN: NEGATIVE mg/dL
Specific Gravity, Urine: 1.015 (ref 1.005–1.030)

## 2016-01-10 LAB — CBC
HCT: 36.8 % — ABNORMAL LOW (ref 39.0–52.0)
HEMOGLOBIN: 12.3 g/dL — AB (ref 13.0–17.0)
MCH: 32.4 pg (ref 26.0–34.0)
MCHC: 33.4 g/dL (ref 30.0–36.0)
MCV: 96.8 fL (ref 78.0–100.0)
Platelets: 204 10*3/uL (ref 150–400)
RBC: 3.8 MIL/uL — AB (ref 4.22–5.81)
RDW: 12.9 % (ref 11.5–15.5)
WBC: 7 10*3/uL (ref 4.0–10.5)

## 2016-01-10 LAB — PROTIME-INR
INR: 1.89 — AB (ref 0.00–1.49)
Prothrombin Time: 21.6 seconds — ABNORMAL HIGH (ref 11.6–15.2)

## 2016-01-10 MED ORDER — SODIUM CHLORIDE 0.9 % IV SOLN
Freq: Once | INTRAVENOUS | Status: AC
  Administered 2016-01-10: 15:00:00 via INTRAVENOUS

## 2016-01-10 MED ORDER — SULFASALAZINE 500 MG PO TBEC
500.0000 mg | DELAYED_RELEASE_TABLET | Freq: Every morning | ORAL | Status: DC
  Administered 2016-01-11 – 2016-01-14 (×4): 500 mg via ORAL
  Filled 2016-01-10 (×5): qty 1

## 2016-01-10 MED ORDER — VITAMIN B-12 1000 MCG PO TABS
1000.0000 ug | ORAL_TABLET | Freq: Every morning | ORAL | Status: DC
  Administered 2016-01-11 – 2016-01-14 (×4): 1000 ug via ORAL
  Filled 2016-01-10 (×4): qty 1

## 2016-01-10 MED ORDER — MIRABEGRON ER 25 MG PO TB24
25.0000 mg | ORAL_TABLET | Freq: Every day | ORAL | Status: DC
  Administered 2016-01-10 – 2016-01-14 (×5): 25 mg via ORAL
  Filled 2016-01-10 (×5): qty 1

## 2016-01-10 MED ORDER — METOPROLOL TARTRATE 50 MG PO TABS
50.0000 mg | ORAL_TABLET | Freq: Two times a day (BID) | ORAL | Status: DC
  Administered 2016-01-10 – 2016-01-14 (×9): 50 mg via ORAL
  Filled 2016-01-10 (×9): qty 1

## 2016-01-10 MED ORDER — ATORVASTATIN CALCIUM 40 MG PO TABS
40.0000 mg | ORAL_TABLET | Freq: Every day | ORAL | Status: DC
  Administered 2016-01-10 – 2016-01-13 (×4): 40 mg via ORAL
  Filled 2016-01-10 (×4): qty 1

## 2016-01-10 MED ORDER — PAROXETINE HCL 20 MG PO TABS
30.0000 mg | ORAL_TABLET | Freq: Every morning | ORAL | Status: DC
  Administered 2016-01-11 – 2016-01-13 (×3): 30 mg via ORAL
  Filled 2016-01-10 (×3): qty 2

## 2016-01-10 MED ORDER — DIGOXIN 125 MCG PO TABS
125.0000 ug | ORAL_TABLET | Freq: Every morning | ORAL | Status: DC
  Administered 2016-01-11 – 2016-01-14 (×4): 125 ug via ORAL
  Filled 2016-01-10 (×4): qty 1

## 2016-01-10 MED ORDER — WARFARIN SODIUM 5 MG PO TABS
2.5000 mg | ORAL_TABLET | Freq: Once | ORAL | Status: AC
  Administered 2016-01-10: 2.5 mg via ORAL
  Filled 2016-01-10: qty 1

## 2016-01-10 MED ORDER — WARFARIN - PHARMACIST DOSING INPATIENT: Freq: Every day | Status: DC

## 2016-01-10 NOTE — ED Notes (Signed)
hospitalist at bedside

## 2016-01-10 NOTE — H&P (Signed)
[facility] History and Physical  [name] VHQ:[id number] DOB: 1930-02-05    PCP:   Rudi Heap, MD   Chief Complaint: Fall.  HPI: [name] is an 82 y.o. male with hx of dementia, essential tremor, possible Parkinson Disease, HLD, anxiety, prior CVA, afib on Coumadin, recent admission for AMS, and mental status decline, fell today at home and was brought to the hospital.  His wife was at his bedside, and couldn't give any reliable information, as she has dementia as well.  He denied HA, SOB, CP, Fever, chills, or any other symptomology.   Work up in the ER included a Na of 127, which was 135 a month ago, and head CT showed no acute process.  His Knee Xray was negative.  He wasn't able to ambulate in the ER, and hospitalist was asked to admit him for further evaluation of his ability to live with his demented wife, along with Tx of hyponatremia.  I spoke with his nephew  (POA), and he felt that placement would be a good plan if possible.   We also discussed code status, and I confirmed that he is a DNR.     Rewiew of Systems:  Constitutional: Negative for malaise, fever and chills. No significant weight loss or weight gain Eyes: Negative for eye pain, redness and discharge, diplopia, visual changes, or flashes of light. ENMT: Negative for ear pain, hoarseness, nasal congestion, sinus pressure and sore throat. No headaches; tinnitus, drooling, or problem swallowing. Cardiovascular: Negative for chest pain, palpitations, diaphoresis, dyspnea and peripheral edema. ; No orthopnea, PND Respiratory: Negative for cough, hemoptysis, wheezing and stridor. No pleuritic chestpain. Gastrointestinal: Negative for nausea, vomiting, diarrhea, constipation, abdominal pain, melena, blood in stool, hematemesis, jaundice and rectal bleeding.    Genitourinary: Negative for frequency, dysuria, incontinence,flank pain and hematuria; Musculoskeletal: Negative for back pain and neck pain. Negative for  swelling and trauma.;  Skin: . Negative for pruritus, rash, abrasions, bruising and skin lesion.; ulcerations Neuro: Negative for headache, lightheadedness and neck stiffness. Negative for weakness, altered level of consciousness , altered mental status, extremity weakness, burning feet, involuntary movement, seizure and syncope.  Psych: negative for anxiety, depression, insomnia, tearfulness, panic attacks, hallucinations, paranoia, suicidal or homicidal ideation    Past Medical History  Diagnosis Date  . Dyslipidemia   . Anxiety   . GIB (gastrointestinal bleeding)   . Obesity   . UC (ulcerative colitis confined to rectum) (HCC)     Left-sided  . BPH (benign prostatic hyperplasia)   . Diverticulosis   . GERD (gastroesophageal reflux disease)   . Hemorrhoids   . Diabetes mellitus   . Tremor   . CAD (coronary artery disease)     Anterior MI and DES stenting 2004, Vfib arrest; left main 25% stenosis, LAD 80% stenosis, 99% stenosis, 25% circumflex stenosis, 25% ramus intermedius stenosis, 50-40% right coronary artery stenosis, 90% RV branch stenosis with right to left collaterals; EF 55% with mild naterior hypokinesis (underwent Taxus stenting at the time) . Stress perfusion study January 2012 EF 48% with old apical infarct  . Hyperlipidemia   . Hyperplastic colon polyp   . Myocardial infarction (HCC)   . Hypertension   . Stroke (HCC)   . Memory deficits 05/05/2013  . Essential and other specified forms of tremor 05/05/2013  . Hearing deficit     Wears hearing aids  . Cataract   . Atrial fibrillation, persistent (HCC)   . Tremor, essential 12/11/2015    Past Surgical  History  Procedure Laterality Date  . Hammer toe surgery      right  . Inguinal hernia repair      right, bilateral  . Coronary angioplasty with stent placement      Taxus stenting  . Cataract extraction w/phaco  10/27/2012    Procedure: CATARACT EXTRACTION PHACO AND INTRAOCULAR LENS PLACEMENT (IOC);  Surgeon: Gemma Payor, MD;  Location: [facility];  Service: Ophthalmology;  Laterality: Left;  CDE 22.67    Medications:  HOME MEDS: Prior to Admission medications   Medication Sig Start Date End Date Taking? Authorizing Provider  atorvastatin (LIPITOR) 40 MG tablet TAKE ONE TABLET AT BEDTIME 10/04/15  Yes [name], MD  Cholecalciferol (VITAMIN D) 1000 UNITS capsule Take 1 capsule (1,000 Units total) by mouth daily. 06/26/13  Yes Tammy Eckard, PHARMD  digoxin (LANOXIN) 0.125 MG tablet TAKE 1 TABLET IN THE MORNING 10/08/15  Yes [name], MD  metoprolol (LOPRESSOR) 100 MG tablet TAKE 1 & 1/2 TABLETS TWICE A DAY 01/06/16  Yes [name], MD  mirabegron ER (MYRBETRIQ) 25 MG TB24 tablet Take 1 tablet (25 mg total) by mouth daily. 09/18/15  Yes Tammy Eckard, PHARMD  Multiple Vitamin (MULTIVITAMIN WITH MINERALS) TABS tablet Take 1 tablet by mouth daily. 08/28/15  Yes [name], MD  PARoxetine (PAXIL) 30 MG tablet TAKE 1 TABLET IN THE MORNING 12/03/15  Yes [name], MD  sulfaSALAzine (AZULFIDINE) 500 MG EC tablet Take 1 tablet (500 mg total) by mouth 2 (two) times daily. Take 500 mg twice a day by mouth for 1 week, then 1000 mg BID Patient taking differently: Take 500 mg by mouth every morning.  11/02/15  Yes [name], MD  vitamin B-12 (CYANOCOBALAMIN) 1000 MCG tablet TAKE 1 TABLET IN THE MORNING 10/08/15  Yes [name], MD  warfarin (COUMADIN) 5 MG tablet Takes as directed Patient taking differently: take one tablet on Tuesdays and Thursdays, then take one-half tablet on all other days 11/01/15  Yes [name], MD  hydrOXYzine (ATARAX/VISTARIL) 10 MG tablet Take one-half tablet daily as needed Patient not taking: Reported on 01/10/2016 01/03/16   [name], MD  nitroGLYCERIN (NITROSTAT) 0.4 MG SL tablet Place 1 tablet (0.4 mg total) under the tongue every 5 (five) minutes as needed for chest pain. 10/08/14   [name], MD     Allergies:  Allergies  Allergen Reactions  .  Asa-Apap-Salicyl-Caff     Takes warfarin  . Codeine Other (See Comments)    Unknown reaction  . Nsaids Other (See Comments)    On warfarin  . Sulfonamide Derivatives Nausea And Vomiting    Social History:   reports that he quit smoking about 57 years ago. He has quit using smokeless tobacco. His smokeless tobacco use included Chew. He reports that he does not drink alcohol or use illicit drugs.  Family History: Family History  Problem Relation Age of Onset  . Colon cancer Neg Hx   . Alzheimer's disease Brother   . Diabetes Brother     x 4  . Diabetes Mother     ?  . Diabetes Father   . Heart disease Father   . Heart disease Paternal Grandfather   . Heart disease Brother     x 3  . COPD Sister   . Diabetes Sister      Physical Exam: Filed Vitals:   01/10/16 0905 01/10/16 1030 01/10/16 1226 01/10/16 1238  BP: 152/107  152/83 119/74   Pulse: 87  115   TempSrc: Oral     Resp: 18 18  18   Height: 5\' 9"  (1.753 m)     Weight: 68.04 kg (150 lb)     SpO2: 97% 93% 93%    Blood pressure 119/74, pulse 115, resp. rate 18, height 5\' 9"  (1.753 m), weight 68.04 kg (150 lb), SpO2 93 %.  GEN:  Pleasant  patient lying in the stretcher in no acute distress; cooperative with exam. PSYCH:  alert , but confused/  does not appear anxious or depressed; affect is appropriate. HEENT: Mucous membranes pink and anicteric; PERRLA; EOM intact; no cervical lymphadenopathy nor thyromegaly or carotid bruit; no JVD; There were no stridor. Neck is very supple. Breasts:: Not examined CHEST WALL: No tenderness CHEST: Normal respiration, clear to auscultation bilaterally.  HEART: Regular rate and rhythm.  There are no murmur, rub, or gallops.   BACK: No kyphosis or scoliosis; no CVA tenderness ABDOMEN: soft and non-tender; no masses, no organomegaly, normal abdominal bowel sounds; no pannus; no intertriginous candida. There is no rebound and no distention. Rectal Exam: Not done EXTREMITIES: No bone or  joint deformity; age-appropriate arthropathy of the hands and knees; no edema; no ulcerations.  There is no calf tenderness. Genitalia: not examined PULSES: 2+ and symmetric SKIN: Normal hydration no rash or ulceration CNS: Cranial nerves 2-12 grossly intact no focal lateralizing neurologic deficit.  Speech is fluent; uvula elevated with phonation, facial symmetry and tongue midline. DTR are normal bilaterally, cerebella exam is intact, barbinski is negative and strengths are equaled bilaterally.  No sensory loss.   Labs on Admission:  Basic Metabolic Panel:  Recent Labs Lab 01/10/16 0941  NA 127*  K 4.2  CL 88*  CO2 33*  GLUCOSE 110*  BUN 9  CREATININE 0.78  CALCIUM 8.6*   Liver Function Tests:  Recent Labs Lab 01/10/16 0941  AST 25  ALT 17  ALKPHOS 64  BILITOT 1.1  PROT 6.7  ALBUMIN 3.8   CBC:  Recent Labs Lab 01/10/16 0947  WBC 7.0  HGB 12.3*  HCT 36.8*  MCV 96.8  PLT 204   CBG:  Recent Labs Lab 01/10/16 0911  GLUCAP 96     Radiological Exams on Admission: Ct Head Wo Contrast  01/10/2016  CLINICAL DATA:  Status post fall this morning. EXAM: CT HEAD WITHOUT CONTRAST TECHNIQUE: Contiguous axial images were obtained from the base of the skull through the vertex without intravenous contrast. COMPARISON:  11/30/2015 FINDINGS: There is no evidence of mass effect, midline shift, or extra-axial fluid collections. There is no evidence of a space-occupying lesion or intracranial hemorrhage. There is no evidence of a cortical-based area of acute infarction. There is generalized cerebral atrophy. There is periventricular white matter low attenuation likely secondary to microangiopathy. The ventricles and sulci are appropriate for the patient's age. The basal cisterns are patent. Visualized portions of the orbits are unremarkable. The visualized portions of the paranasal sinuses and mastoid air cells are unremarkable. Cerebrovascular atherosclerotic calcifications are  noted. The osseous structures are unremarkable. IMPRESSION: 1. No acute intracranial pathology. 2. Chronic microvascular disease and cerebral atrophy. Electronically Signed   By: Elige Ko   On: 01/10/2016 11:37   Dg Knee Complete 4 Views Left  01/10/2016  CLINICAL DATA:  Larey Seat this morning with left knee pain. EXAM: LEFT KNEE - COMPLETE 4+ VIEW COMPARISON:  None. FINDINGS: Mild medial joint space narrowing. The knee is located without definite fracture. There is a  small cortical step-off involving the proximal fibula which is probably chronic. No significant joint effusion. There are vascular calcifications. IMPRESSION: Mild osteoarthritic changes without acute bone abnormality. Mild cortical irregularity involving the proximal fibula is probably chronic. Recommend clinical correlation in this area. Electronically Signed   By: Richarda Overlie M.D.   On: 01/10/2016 10:32    EKG: Independently reviewed.    Assessment/Plan Present on Admission:  . Fall . Atrial fibrillation, persistent (HCC) . Cerebrovascular disease, unspecified . Hyponatremia . Essential tremor . Memory deficits  PLAN:  HYPONATREMIA:  This is likely hypovolemic hyponatremia.  Will give IVF and follow Na closely to avoid over correction.  Afib:   I discussed the danger of anticoagulation, and his POA understood, and would like to continue with this for his Afib.  His cardiologist had told him that he should continue anticoagulation.  Fall:  Additional risk for anticoagulation.  We will seek SNF placement.  LCSW was consulted.    Other plans as per orders. Code Status:  DNR.  (confirmed today).    Houston Siren, MD. FACP [facility] Pager [phone] 7pm to 7am.  01/10/2016, 1:26 PM

## 2016-01-10 NOTE — ED Notes (Signed)
Ems states that pt fell this morning, they went to residence and got patient out of the floor.  Pt denied any complaints and refused transport.  Pt called them back for "feeling nervous."  Continues to deny any pain from fall and does not remember falling this morning.

## 2016-01-10 NOTE — ED Provider Notes (Signed)
CSN: [id number]     Arrival date & time 01/10/16  0902 History  By signing my name below, I, [name], attest that this documentation has been prepared under the direction and in the presence of [name], MD. Electronically Signed: Bethel [name], ED Scribe. 01/10/2016. 9:20 AM   Chief Complaint  Patient presents with  . Fall  . Anxiety   The history is provided by the patient. No language interpreter was used.   Brought in by EMS from home, [name] is a 82 y.o. male with history of anxiety, persistent a-fib, DM, HLD, HTN, MI, and stroke  who presents to the Emergency Department complaining of a fall at home sometime prior to arrival. Pt reports that the wind blew him down on the way to the mailbox and he fell to the knees scraping them. He does remember the incident and denies any other symptoms including headache, chest pain, difficulty urinating, dysuria, cough, and change in vision.  He refused initial EMS transport because he had no pain. He then called EMS back because he felt anxious/nervous and like something was not right.  The pt reports that he has a "bit of Parkinsons"     Past Medical History  Diagnosis Date  . Dyslipidemia   . Anxiety   . GIB (gastrointestinal bleeding)   . Obesity   . UC (ulcerative colitis confined to rectum) (HCC)     Left-sided  . BPH (benign prostatic hyperplasia)   . Diverticulosis   . GERD (gastroesophageal reflux disease)   . Hemorrhoids   . Diabetes mellitus   . Tremor   . CAD (coronary artery disease)     Anterior MI and DES stenting 2004, Vfib arrest; left main 25% stenosis, LAD 80% stenosis, 99% stenosis, 25% circumflex stenosis, 25% ramus intermedius stenosis, 50-40% right coronary artery stenosis, 90% RV branch stenosis with right to left collaterals; EF 55% with mild naterior hypokinesis (underwent Taxus stenting at the time) . Stress perfusion study January 2012 EF 48% with old apical infarct  . Hyperlipidemia   .  Hyperplastic colon polyp   . Myocardial infarction (HCC)   . Hypertension   . Memory deficits 05/05/2013  . Essential and other specified forms of tremor 05/05/2013  . Hearing deficit     Wears hearing aids  . Cataract   . Atrial fibrillation, persistent (HCC)   . Tremor, essential 12/11/2015  . Stroke [facility])    Past Surgical History  Procedure Laterality Date  . Hammer toe surgery      right  . Inguinal hernia repair      right, bilateral  . Coronary angioplasty with stent placement      Taxus stenting  . Cataract extraction w/phaco  10/27/2012    Procedure: CATARACT EXTRACTION PHACO AND INTRAOCULAR LENS PLACEMENT (IOC);  Surgeon: Gemma Payor, MD;  Location: [facility];  Service: Ophthalmology;  Laterality: Left;  CDE 22.67   Family History  Problem Relation Age of Onset  . Colon cancer Neg Hx   . Alzheimer's disease Brother   . Diabetes Brother     x 4  . Diabetes Mother     ?  . Diabetes Father   . Heart disease Father   . Heart disease Paternal Grandfather   . Heart disease Brother     x 3  . COPD Sister   . Diabetes Sister    Social History  Substance Use Topics  . Smoking status: Former Smoker    Quit date:  11/23/1958  . Smokeless tobacco: Former Neurosurgeon    Types: Chew     Comment: Quit years ago  . Alcohol Use: No    Review of Systems  Eyes: Negative for visual disturbance.  Respiratory: Negative for cough.   Cardiovascular: Negative for chest pain.  Genitourinary: Negative for dysuria and difficulty urinating.  Neurological: Negative for headaches.  Psychiatric/Behavioral: The patient is nervous/anxious.   All other systems reviewed and are negative.   Allergies  Asa-apap-salicyl-caff; Codeine; Nsaids; and Sulfonamide derivatives  Home Medications   Prior to Admission medications   Medication Sig Start Date End Date Taking? Authorizing Provider  atorvastatin (LIPITOR) 40 MG tablet TAKE ONE TABLET AT BEDTIME 10/04/15  Yes [name], MD   Cholecalciferol (VITAMIN D) 1000 UNITS capsule Take 1 capsule (1,000 Units total) by mouth daily. 06/26/13  Yes Tammy Eckard, PHARMD  digoxin (LANOXIN) 0.125 MG tablet TAKE 1 TABLET IN THE MORNING 10/08/15  Yes [name], MD  metoprolol (LOPRESSOR) 100 MG tablet TAKE 1 & 1/2 TABLETS TWICE A DAY 01/06/16  Yes [name], MD  mirabegron ER (MYRBETRIQ) 25 MG TB24 tablet Take 1 tablet (25 mg total) by mouth daily. 09/18/15  Yes Tammy Eckard, PHARMD  Multiple Vitamin (MULTIVITAMIN WITH MINERALS) TABS tablet Take 1 tablet by mouth daily. 08/28/15  Yes [name], MD  PARoxetine (PAXIL) 30 MG tablet TAKE 1 TABLET IN THE MORNING 12/03/15  Yes [name], MD  sulfaSALAzine (AZULFIDINE) 500 MG EC tablet Take 1 tablet (500 mg total) by mouth 2 (two) times daily. Take 500 mg twice a day by mouth for 1 week, then 1000 mg BID Patient taking differently: Take 500 mg by mouth every morning.  11/02/15  Yes [name], MD  vitamin B-12 (CYANOCOBALAMIN) 1000 MCG tablet TAKE 1 TABLET IN THE MORNING 10/08/15  Yes [name], MD  warfarin (COUMADIN) 5 MG tablet Takes as directed Patient taking differently: take one tablet on Tuesdays and Thursdays, then take one-half tablet on all other days 11/01/15  Yes [name], MD  hydrOXYzine (ATARAX/VISTARIL) 10 MG tablet Take one-half tablet daily as needed Patient not taking: Reported on 01/10/2016 01/03/16   [name], MD  nitroGLYCERIN (NITROSTAT) 0.4 MG SL tablet Place 1 tablet (0.4 mg total) under the tongue every 5 (five) minutes as needed for chest pain. 10/08/14   [name], MD   BP 152/107 mmHg  Pulse 87  Resp 18  Ht 5\' 9"  (1.753 m)  Wt 150 lb (68.04 kg)  BMI 22.14 kg/m2  SpO2 97% Physical Exam  Constitutional: He appears well-developed and well-nourished. No distress.  HENT:  Head: Normocephalic and atraumatic.  Mouth/Throat: Oropharynx is clear and moist. No oropharyngeal exudate.  Eyes: Conjunctivae and EOM are normal.  Pupils are equal, round, and reactive to light. Right eye exhibits no discharge. Left eye exhibits no discharge. No scleral icterus.  Neck: Normal range of motion. Neck supple. No JVD present. No thyromegaly present.  Cardiovascular: Normal rate, normal heart sounds and intact distal pulses.  An irregular rhythm present. Exam reveals no gallop and no friction rub.   No murmur heard. Strong pulses  Pulmonary/Chest: Effort normal and breath sounds normal. No respiratory distress. He has no wheezes. He has no rales.  Abdominal: Soft. Bowel sounds are normal. He exhibits no distension and no mass. There is no tenderness.  Musculoskeletal: Normal range of motion. He exhibits no edema or tenderness.  Lymphadenopathy:  He has no cervical adenopathy.  Neurological: He is alert. Coordination normal.  Diffuse BUE tremor. Finger nose finger intact given baseline tremor. Speech is clear. Symmetrical 5/5 strength at bilateral upper and lower extremities.  Shuffling gait. Mild difficulty with transition from sitting to standing.  Skin: Skin is warm and dry. Bruising noted. No rash noted. No erythema.  Mild bruising to the left proximal lower extremity, below the knee, media surface  Psychiatric: He has a normal mood and affect. His behavior is normal.  Nursing note and vitals reviewed.   ED Course  Procedures (including critical care time) DIAGNOSTIC STUDIES: Oxygen Saturation is 97% on RA,  normal by my interpretation.    Labs Review Labs Reviewed  CBC - Abnormal; Notable for the following:    RBC 3.80 (*)    Hemoglobin 12.3 (*)    HCT 36.8 (*)    All other components within normal limits  COMPREHENSIVE METABOLIC PANEL - Abnormal; Notable for the following:    Sodium 127 (*)    Chloride 88 (*)    CO2 33 (*)    Glucose, Bld 110 (*)    Calcium 8.6 (*)    All other components within normal limits  URINE CULTURE  URINALYSIS, ROUTINE W REFLEX MICROSCOPIC (NOT AT [facility])  CBG MONITORING, ED     Imaging Review Ct Head Wo Contrast  01/10/2016  CLINICAL DATA:  Status post fall this morning. EXAM: CT HEAD WITHOUT CONTRAST TECHNIQUE: Contiguous axial images were obtained from the base of the skull through the vertex without intravenous contrast. COMPARISON:  11/30/2015 FINDINGS: There is no evidence of mass effect, midline shift, or extra-axial fluid collections. There is no evidence of a space-occupying lesion or intracranial hemorrhage. There is no evidence of a cortical-based area of acute infarction. There is generalized cerebral atrophy. There is periventricular white matter low attenuation likely secondary to microangiopathy. The ventricles and sulci are appropriate for the patient's age. The basal cisterns are patent. Visualized portions of the orbits are unremarkable. The visualized portions of the paranasal sinuses and mastoid air cells are unremarkable. Cerebrovascular atherosclerotic calcifications are noted. The osseous structures are unremarkable. IMPRESSION: 1. No acute intracranial pathology. 2. Chronic microvascular disease and cerebral atrophy. Electronically Signed   By: Elige Ko   On: 01/10/2016 11:37   Dg Knee Complete 4 Views Left  01/10/2016  CLINICAL DATA:  Larey Seat this morning with left knee pain. EXAM: LEFT KNEE - COMPLETE 4+ VIEW COMPARISON:  None. FINDINGS: Mild medial joint space narrowing. The knee is located without definite fracture. There is a small cortical step-off involving the proximal fibula which is probably chronic. No significant joint effusion. There are vascular calcifications. IMPRESSION: Mild osteoarthritic changes without acute bone abnormality. Mild cortical irregularity involving the proximal fibula is probably chronic. Recommend clinical correlation in this area. Electronically Signed   By: Richarda Overlie M.D.   On: 01/10/2016 10:32       MDM   Final diagnoses:  Hyponatremia  Fall, initial encounter    I personally performed the services  described in this documentation, which was scribed in my presence. The recorded information has been reviewed and is accurate.    The pt has WBC of 7, Hgb of 12.3, normal. Na of 127, seems to be new UA clean, CBG normal CT head without acute fidnigns Knee without fracture D/w Dr. Conley Rolls who will admit to hospital  Meds given in ED:  Medications  0.9 %  sodium chloride infusion (not administered)  [name], MD 01/10/16 [phone]

## 2016-01-10 NOTE — Progress Notes (Signed)
ANTICOAGULATION CONSULT NOTE - Initial Consult  Pharmacy Consult for Coumadin Indication: atrial fibrillation  Allergies  Allergen Reactions  . Asa-Apap-Salicyl-Caff     Takes warfarin  . Codeine Other (See Comments)    Unknown reaction  . Nsaids Other (See Comments)    On warfarin  . Sulfonamide Derivatives Nausea And Vomiting    Patient Measurements: Height:  (175.3 cm) Weight: 150 lb (68.04 kg) IBW/kg (Calculated) : 70.7 Heparin Dosing Weight:   Vital Signs: Temp: 98.1 F (36.7 C) (02/17 1448) Temp Source: Oral (02/17 1448) BP: 165/78 mmHg (02/17 1448) Pulse Rate: 90 (02/17 1448)  Labs:  Recent Labs  01/10/16 0941 01/10/16 0947  HGB  --  12.3*  HCT  --  36.8*  PLT  --  204  CREATININE 0.78  --     Estimated Creatinine Clearance: 67.3 mL/min (by C-G formula based on Cr of 0.78).   Medical History: Past Medical History  Diagnosis Date  . Dyslipidemia   . Anxiety   . GIB (gastrointestinal bleeding)   . Obesity   . UC (ulcerative colitis confined to rectum) (HCC)     Left-sided  . BPH (benign prostatic hyperplasia)   . Diverticulosis   . GERD (gastroesophageal reflux disease)   . Hemorrhoids   . Diabetes mellitus   . Tremor   . CAD (coronary artery disease)     Anterior MI and DES stenting 2004, Vfib arrest; left main 25% stenosis, LAD 80% stenosis, 99% stenosis, 25% circumflex stenosis, 25% ramus intermedius stenosis, 50-40% right coronary artery stenosis, 90% RV branch stenosis with right to left collaterals; EF 55% with mild naterior hypokinesis (underwent Taxus stenting at the time) . Stress perfusion study January 2012 EF 48% with old apical infarct  . Hyperlipidemia   . Hyperplastic colon polyp   . Myocardial infarction (HCC)   . Hypertension   . Memory deficits 05/05/2013  . Essential and other specified forms of tremor 05/05/2013  . Hearing deficit     Wears hearing aids  . Cataract   . Atrial fibrillation, persistent (HCC)   . Tremor,  essential 12/11/2015  . Stroke [facility])     Medications:  Prescriptions prior to admission  Medication Sig Dispense Refill Last Dose  . atorvastatin (LIPITOR) 40 MG tablet TAKE ONE TABLET AT BEDTIME 30 tablet 4 01/09/2016 at Unknown time  . Cholecalciferol (VITAMIN D) 1000 UNITS capsule Take 1 capsule (1,000 Units total) by mouth daily.   01/09/2016 at Unknown time  . digoxin (LANOXIN) 0.125 MG tablet TAKE 1 TABLET IN THE MORNING 30 tablet 4 01/09/2016 at Unknown time  . metoprolol (LOPRESSOR) 100 MG tablet TAKE 1 & 1/2 TABLETS TWICE A DAY 135 tablet 1 01/09/2016 at Unknown time  . mirabegron ER (MYRBETRIQ) 25 MG TB24 tablet Take 1 tablet (25 mg total) by mouth daily. 28 tablet 0 01/09/2016 at Unknown time  . Multiple Vitamin (MULTIVITAMIN WITH MINERALS) TABS tablet Take 1 tablet by mouth daily. 30 tablet 11 01/09/2016 at Unknown time  . PARoxetine (PAXIL) 30 MG tablet TAKE 1 TABLET IN THE MORNING 30 tablet 0 01/09/2016 at Unknown time  . sulfaSALAzine (AZULFIDINE) 500 MG EC tablet Take 1 tablet (500 mg total) by mouth 2 (two) times daily. Take 500 mg twice a day by mouth for 1 week, then 1000 mg BID (Patient taking differently: Take 500 mg by mouth every morning. ) 60 tablet 0 01/09/2016 at Unknown time  . vitamin B-12 (CYANOCOBALAMIN) 1000 MCG tablet TAKE 1 TABLET IN  THE MORNING 30 tablet 4 01/09/2016 at Unknown time  . warfarin (COUMADIN) 5 MG tablet Takes as directed (Patient taking differently: take one tablet on Tuesdays and Thursdays, then take one-half tablet on all other days) 25 tablet 2 01/09/2016 at Unknown time  . hydrOXYzine (ATARAX/VISTARIL) 10 MG tablet Take one-half tablet daily as needed (Patient not taking: Reported on 01/10/2016) 15 tablet 0 Not Taking at Unknown time  . nitroGLYCERIN (NITROSTAT) 0.4 MG SL tablet Place 1 tablet (0.4 mg total) under the tongue every 5 (five) minutes as needed for chest pain. 25 tablet 1 Taking    Assessment: Patient discharged from [facility] 12/03/2015.  Present  Med Rec Coumadin dosing same as anticoagulation dose as listed below. INR slightly sub therapeutic   Anticoagulation Dose Instructions as of 10/29/2015       Total Sun Mon Tue Wed Thu Fri Sat    New Dose 22.5 mg 2.5 mg 2.5 mg 5 mg 2.5 mg 5 mg 2.5 mg 2.5 mg      (5 mg x 0.5)  (5 mg x 0.5)  (5 mg x 1)  (5 mg x 0.5)  (5 mg x 1)  (5 mg x 0.5)  (5 mg x 0.5)                  Goal of Therapy:  INR 2-3 Monitor platelets by anticoagulation protocol: Yes   Plan:  Coumadin 2.5 mg po x 1 dose today (home regiment) INR/PT daily  [name], Emma Schupp Bennett 01/10/2016,3:29 PM

## 2016-01-11 LAB — CBC
HCT: 37.4 % — ABNORMAL LOW (ref 39.0–52.0)
HEMOGLOBIN: 12.4 g/dL — AB (ref 13.0–17.0)
MCH: 32.2 pg (ref 26.0–34.0)
MCHC: 33.2 g/dL (ref 30.0–36.0)
MCV: 97.1 fL (ref 78.0–100.0)
PLATELETS: 195 10*3/uL (ref 150–400)
RBC: 3.85 MIL/uL — AB (ref 4.22–5.81)
RDW: 13.6 % (ref 11.5–15.5)
WBC: 4.9 10*3/uL (ref 4.0–10.5)

## 2016-01-11 LAB — BASIC METABOLIC PANEL
ANION GAP: 6 (ref 5–15)
BUN: 8 mg/dL (ref 6–20)
CHLORIDE: 91 mmol/L — AB (ref 101–111)
CO2: 32 mmol/L (ref 22–32)
Calcium: 8.4 mg/dL — ABNORMAL LOW (ref 8.9–10.3)
Creatinine, Ser: 0.65 mg/dL (ref 0.61–1.24)
GFR calc non Af Amer: >60 mL/min (ref >60)
Glucose, Bld: 106 mg/dL — ABNORMAL HIGH (ref 65–99)
POTASSIUM: 4.2 mmol/L (ref 3.5–5.1)
SODIUM: 129 mmol/L — AB (ref 135–145)

## 2016-01-11 LAB — PROTIME-INR
INR: 1.83 — AB (ref 0.00–1.49)
PROTHROMBIN TIME: 21.1 s — AB (ref 11.6–15.2)

## 2016-01-11 MED ORDER — WARFARIN SODIUM 5 MG PO TABS
2.5000 mg | ORAL_TABLET | Freq: Once | ORAL | Status: AC
  Administered 2016-01-11: 2.5 mg via ORAL
  Filled 2016-01-11: qty 1

## 2016-01-11 MED ORDER — WARFARIN - PHARMACIST DOSING INPATIENT
Status: DC
Start: 2016-01-11 — End: 2016-01-14

## 2016-01-11 NOTE — Progress Notes (Signed)
[facility] PROGRESS NOTE  [name] BJY:[id number] DOB: [birth year]    PCP:   Rudi Heap, MD   HPI:  Patient with dementia, AMS, anxiety, essential tremor, prior CVA and afib on anticoagulation, admitted for falling, and hyponatremia.  He also needs placement.  His Na improved from 127 to 129, receiving IV NS.  He has no complaints.    Rewiew of Systems: Unreliable.    Past Medical History  Diagnosis Date  . Dyslipidemia   . Anxiety   . GIB (gastrointestinal bleeding)   . Obesity   . UC (ulcerative colitis confined to rectum) (HCC)     Left-sided  . BPH (benign prostatic hyperplasia)   . Diverticulosis   . GERD (gastroesophageal reflux disease)   . Hemorrhoids   . Diabetes mellitus   . Tremor   . CAD (coronary artery disease)     Anterior MI and DES stenting 2004, Vfib arrest; left main 25% stenosis, LAD 80% stenosis, 99% stenosis, 25% circumflex stenosis, 25% ramus intermedius stenosis, 50-40% right coronary artery stenosis, 90% RV branch stenosis with right to left collaterals; EF 55% with mild naterior hypokinesis (underwent Taxus stenting at the time) . Stress perfusion study January 2012 EF 48% with old apical infarct  . Hyperlipidemia   . Hyperplastic colon polyp   . Myocardial infarction (HCC)   . Hypertension   . Memory deficits 05/05/2013  . Essential and other specified forms of tremor 05/05/2013  . Hearing deficit     Wears hearing aids  . Cataract   . Atrial fibrillation, persistent (HCC)   . Tremor, essential 12/11/2015  . Stroke [facility])     Past Surgical History  Procedure Laterality Date  . Hammer toe surgery      right  . Inguinal hernia repair      right, bilateral  . Coronary angioplasty with stent placement      Taxus stenting  . Cataract extraction w/phaco  10/27/2012    Procedure: CATARACT EXTRACTION PHACO AND INTRAOCULAR LENS PLACEMENT (IOC);  Surgeon: Gemma Payor, MD;  Location: [facility];  Service: Ophthalmology;  Laterality: Left;  CDE  22.67    Medications:  HOME MEDS: Prior to Admission medications   Medication Sig Start Date End Date Taking? Authorizing Provider  atorvastatin (LIPITOR) 40 MG tablet TAKE ONE TABLET AT BEDTIME 10/04/15  Yes [name], MD  Cholecalciferol (VITAMIN D) 1000 UNITS capsule Take 1 capsule (1,000 Units total) by mouth daily. 06/26/13  Yes Tammy Eckard, PHARMD  digoxin (LANOXIN) 0.125 MG tablet TAKE 1 TABLET IN THE MORNING 10/08/15  Yes [name], MD  metoprolol (LOPRESSOR) 100 MG tablet TAKE 1 & 1/2 TABLETS TWICE A DAY 01/06/16  Yes [name], MD  mirabegron ER (MYRBETRIQ) 25 MG TB24 tablet Take 1 tablet (25 mg total) by mouth daily. 09/18/15  Yes Tammy Eckard, PHARMD  Multiple Vitamin (MULTIVITAMIN WITH MINERALS) TABS tablet Take 1 tablet by mouth daily. 08/28/15  Yes [name], MD  PARoxetine (PAXIL) 30 MG tablet TAKE 1 TABLET IN THE MORNING 12/03/15  Yes [name], MD  sulfaSALAzine (AZULFIDINE) 500 MG EC tablet Take 1 tablet (500 mg total) by mouth 2 (two) times daily. Take 500 mg twice a day by mouth for 1 week, then 1000 mg BID Patient taking differently: Take 500 mg by mouth every morning.  11/02/15  Yes [name], MD  vitamin B-12 (CYANOCOBALAMIN) 1000 MCG tablet TAKE 1 TABLET IN THE MORNING 10/08/15  Yes Dorinda Hill  Hoy Register, MD  warfarin (COUMADIN) 5 MG tablet Takes as directed Patient taking differently: take one tablet on Tuesdays and Thursdays, then take one-half tablet on all other days 11/01/15  Yes [name], MD  hydrOXYzine (ATARAX/VISTARIL) 10 MG tablet Take one-half tablet daily as needed Patient not taking: Reported on 01/10/2016 01/03/16   [name], MD  nitroGLYCERIN (NITROSTAT) 0.4 MG SL tablet Place 1 tablet (0.4 mg total) under the tongue every 5 (five) minutes as needed for chest pain. 10/08/14   [name], MD     Allergies:  Allergies  Allergen Reactions  . Asa-Apap-Salicyl-Caff     Takes warfarin  . Codeine Other (See Comments)     Unknown reaction  . Nsaids Other (See Comments)    On warfarin  . Sulfonamide Derivatives Nausea And Vomiting    Social History:   reports that he quit smoking about 57 years ago. He has quit using smokeless tobacco. His smokeless tobacco use included Chew. He reports that he does not drink alcohol or use illicit drugs.  Family History: Family History  Problem Relation Age of Onset  . Colon cancer Neg Hx   . Alzheimer's disease Brother   . Diabetes Brother     x 4  . Diabetes Mother     ?  . Diabetes Father   . Heart disease Father   . Heart disease Paternal Grandfather   . Heart disease Brother     x 3  . COPD Sister   . Diabetes Sister      Physical Exam: Filed Vitals:   01/10/16 2100 01/10/16 2305 01/11/16 0659 01/11/16 1100  BP: 141/88  133/98 120/76  Pulse: 92 94 94 99  Temp:  97.2 F (36.2 C) 97.6 F (36.4 C)   TempSrc:  Axillary Axillary   Resp:  20 20   Height:      Weight:      SpO2:  97% 98%    Blood pressure 120/76, pulse 99, temperature 97.6 F (36.4 C), temperature source Axillary, resp. rate 20, height 5\' 9"  (1.753 m), weight 68.04 kg (150 lb), SpO2 98 %.  GEN:  Pleasant  patient lying in the stretcher in no acute distress; cooperative with exam. PSYCH:  alert and confused. does not appear anxious or depressed; affect is appropriate. HEENT: Mucous membranes pink and anicteric; PERRLA; EOM intact; no cervical lymphadenopathy nor thyromegaly or carotid bruit; no JVD; There were no stridor. Neck is very supple. Breasts:: Not examined CHEST WALL: No tenderness CHEST: Normal respiration, clear to auscultation bilaterally.  HEART: Regular rate and rhythm.  There are no murmur, rub, or gallops.   BACK: No kyphosis or scoliosis; no CVA tenderness ABDOMEN: soft and non-tender; no masses, no organomegaly, normal abdominal bowel sounds; no pannus; no intertriginous candida. There is no rebound and no distention. Rectal Exam: Not done EXTREMITIES: No bone or  joint deformity; age-appropriate arthropathy of the hands and knees; no edema; no ulcerations.  There is no calf tenderness. Genitalia: not examined PULSES: 2+ and symmetric SKIN: Normal hydration no rash or ulceration CNS: Cranial nerves 2-12 grossly intact no focal lateralizing neurologic deficit.  Speech is fluent; uvula elevated with phonation, facial symmetry and tongue midline. DTR are normal bilaterally, cerebella exam is intact, barbinski is negative and strengths are equaled bilaterally.  No sensory loss.   Labs on Admission:  Basic Metabolic Panel:  Recent Labs Lab 01/10/16 0941 01/11/16 0719  NA 127* 129*  K 4.2 4.2  CL 88* 91*  CO2 33* 32  GLUCOSE 110* 106*  BUN 9 8  CREATININE 0.78 0.65  CALCIUM 8.6* 8.4*   Liver Function Tests:  Recent Labs Lab 01/10/16 0941  AST 25  ALT 17  ALKPHOS 64  BILITOT 1.1  PROT 6.7  ALBUMIN 3.8   CBC:  Recent Labs Lab 01/10/16 0947 01/11/16 0719  WBC 7.0 4.9  HGB 12.3* 12.4*  HCT 36.8* 37.4*  MCV 96.8 97.1  PLT 204 195    CBG:  Recent Labs Lab 01/10/16 0911  GLUCAP 96     Radiological Exams on Admission: Ct Head Wo Contrast  01/10/2016  CLINICAL DATA:  Status post fall this morning. EXAM: CT HEAD WITHOUT CONTRAST TECHNIQUE: Contiguous axial images were obtained from the base of the skull through the vertex without intravenous contrast. COMPARISON:  11/30/2015 FINDINGS: There is no evidence of mass effect, midline shift, or extra-axial fluid collections. There is no evidence of a space-occupying lesion or intracranial hemorrhage. There is no evidence of a cortical-based area of acute infarction. There is generalized cerebral atrophy. There is periventricular white matter low attenuation likely secondary to microangiopathy. The ventricles and sulci are appropriate for the patient's age. The basal cisterns are patent. Visualized portions of the orbits are unremarkable. The visualized portions of the paranasal sinuses and  mastoid air cells are unremarkable. Cerebrovascular atherosclerotic calcifications are noted. The osseous structures are unremarkable. IMPRESSION: 1. No acute intracranial pathology. 2. Chronic microvascular disease and cerebral atrophy. Electronically Signed   By: Elige Ko   On: 01/10/2016 11:37   Dg Knee Complete 4 Views Left  01/10/2016  CLINICAL DATA:  Larey Seat this morning with left knee pain. EXAM: LEFT KNEE - COMPLETE 4+ VIEW COMPARISON:  None. FINDINGS: Mild medial joint space narrowing. The knee is located without definite fracture. There is a small cortical step-off involving the proximal fibula which is probably chronic. No significant joint effusion. There are vascular calcifications. IMPRESSION: Mild osteoarthritic changes without acute bone abnormality. Mild cortical irregularity involving the proximal fibula is probably chronic. Recommend clinical correlation in this area. Electronically Signed   By: Richarda Overlie M.D.   On: 01/10/2016 10:32   Assessment/Plan Present on Admission:  . Fall . Atrial fibrillation, persistent (HCC) . Cerebrovascular disease, unspecified . Hyponatremia . Essential tremor . Memory deficits  PLAN:  Falling:  No injury.  Increased risks, but will continue anticoagulation.  Discussed risks with POA his nephew.  Afib:  Continue with Coumadin per PharmD dosing.  Hyponatremia:  Likely hypovolemia hyponatremia.  Will continue with IV NS.  Dementia:  Await evaluation for placement.   Other plans as per orders. Code Status: DNR.   Houston Siren, MD.  FACP [facility] Pager [phone] 7pm to 7am.  01/11/2016, 1:53 PM

## 2016-01-11 NOTE — Progress Notes (Signed)
ANTICOAGULATION CONSULT NOTE -  Pharmacy Consult for Coumadin Indication: atrial fibrillation  Allergies  Allergen Reactions  . Asa-Apap-Salicyl-Caff     Takes warfarin  . Codeine Other (See Comments)    Unknown reaction  . Nsaids Other (See Comments)    On warfarin  . Sulfonamide Derivatives Nausea And Vomiting    Patient Measurements: Height:  (175.3 cm) Weight: 150 lb (68.04 kg) IBW/kg (Calculated) : 70.7 Heparin Dosing Weight:   Vital Signs: Temp: 97.6 F (36.4 C) (02/18 0659) Temp Source: Axillary (02/18 0659) BP: 120/76 mmHg (02/18 1100) Pulse Rate: 99 (02/18 1100)  Labs:  Recent Labs  01/10/16 0941 01/10/16 0947 01/10/16 1547 01/11/16 0719  HGB  --  12.3*  --  12.4*  HCT  --  36.8*  --  37.4*  PLT  --  204  --  195  LABPROT  --   --  21.6* 21.1*  INR  --   --  1.89* 1.83*  CREATININE 0.78  --   --  0.65    Estimated Creatinine Clearance: 67.3 mL/min (by C-G formula based on Cr of 0.65).   Medical History: Past Medical History  Diagnosis Date  . Dyslipidemia   . Anxiety   . GIB (gastrointestinal bleeding)   . Obesity   . UC (ulcerative colitis confined to rectum) (HCC)     Left-sided  . BPH (benign prostatic hyperplasia)   . Diverticulosis   . GERD (gastroesophageal reflux disease)   . Hemorrhoids   . Diabetes mellitus   . Tremor   . CAD (coronary artery disease)     Anterior MI and DES stenting 2004, Vfib arrest; left main 25% stenosis, LAD 80% stenosis, 99% stenosis, 25% circumflex stenosis, 25% ramus intermedius stenosis, 50-40% right coronary artery stenosis, 90% RV branch stenosis with right to left collaterals; EF 55% with mild naterior hypokinesis (underwent Taxus stenting at the time) . Stress perfusion study January 2012 EF 48% with old apical infarct  . Hyperlipidemia   . Hyperplastic colon polyp   . Myocardial infarction (HCC)   . Hypertension   . Memory deficits 05/05/2013  . Essential and other specified forms of tremor  05/05/2013  . Hearing deficit     Wears hearing aids  . Cataract   . Atrial fibrillation, persistent (HCC)   . Tremor, essential 12/11/2015  . Stroke [facility])     Medications:  Prescriptions prior to admission  Medication Sig Dispense Refill Last Dose  . atorvastatin (LIPITOR) 40 MG tablet TAKE ONE TABLET AT BEDTIME 30 tablet 4 01/09/2016 at Unknown time  . Cholecalciferol (VITAMIN D) 1000 UNITS capsule Take 1 capsule (1,000 Units total) by mouth daily.   01/09/2016 at Unknown time  . digoxin (LANOXIN) 0.125 MG tablet TAKE 1 TABLET IN THE MORNING 30 tablet 4 01/09/2016 at Unknown time  . metoprolol (LOPRESSOR) 100 MG tablet TAKE 1 & 1/2 TABLETS TWICE A DAY 135 tablet 1 01/09/2016 at Unknown time  . mirabegron ER (MYRBETRIQ) 25 MG TB24 tablet Take 1 tablet (25 mg total) by mouth daily. 28 tablet 0 01/09/2016 at Unknown time  . Multiple Vitamin (MULTIVITAMIN WITH MINERALS) TABS tablet Take 1 tablet by mouth daily. 30 tablet 11 01/09/2016 at Unknown time  . PARoxetine (PAXIL) 30 MG tablet TAKE 1 TABLET IN THE MORNING 30 tablet 0 01/09/2016 at Unknown time  . sulfaSALAzine (AZULFIDINE) 500 MG EC tablet Take 1 tablet (500 mg total) by mouth 2 (two) times daily. Take 500 mg twice a day by  mouth for 1 week, then 1000 mg BID (Patient taking differently: Take 500 mg by mouth every morning. ) 60 tablet 0 01/09/2016 at Unknown time  . vitamin B-12 (CYANOCOBALAMIN) 1000 MCG tablet TAKE 1 TABLET IN THE MORNING 30 tablet 4 01/09/2016 at Unknown time  . warfarin (COUMADIN) 5 MG tablet Takes as directed (Patient taking differently: take one tablet on Tuesdays and Thursdays, then take one-half tablet on all other days) 25 tablet 2 01/09/2016 at Unknown time  . hydrOXYzine (ATARAX/VISTARIL) 10 MG tablet Take one-half tablet daily as needed (Patient not taking: Reported on 01/10/2016) 15 tablet 0 Not Taking at Unknown time  . nitroGLYCERIN (NITROSTAT) 0.4 MG SL tablet Place 1 tablet (0.4 mg total) under the tongue every 5  (five) minutes as needed for chest pain. 25 tablet 1 Taking    Assessment: Patient discharged from [facility] 12/03/2015.  Present Med Rec Coumadin dosing same as anticoagulation dose as listed below. INR slightly sub therapeutic 1.83  Anticoagulation Dose Instructions as of 10/29/2015       Total Sun Mon Tue Wed Thu Fri Sat    New Dose 22.5 mg 2.5 mg 2.5 mg 5 mg 2.5 mg 5 mg 2.5 mg 2.5 mg      (5 mg x 0.5)  (5 mg x 0.5)  (5 mg x 1)  (5 mg x 0.5)  (5 mg x 1)  (5 mg x 0.5)  (5 mg x 0.5)                  Goal of Therapy:  INR 2-3 Monitor platelets by anticoagulation protocol: Yes   Plan:  Coumadin 2.5 mg po x 1 dose today (home regiment) INR/PT daily  Raquel James, Doshie Maggi Bennett 01/11/2016,11:58 AM

## 2016-01-12 DIAGNOSIS — W19XXXD Unspecified fall, subsequent encounter: Secondary | ICD-10-CM

## 2016-01-12 LAB — BASIC METABOLIC PANEL
ANION GAP: 8 (ref 5–15)
BUN: 8 mg/dL (ref 6–20)
CO2: 27 mmol/L (ref 22–32)
Calcium: 8.1 mg/dL — ABNORMAL LOW (ref 8.9–10.3)
Chloride: 93 mmol/L — ABNORMAL LOW (ref 101–111)
Creatinine, Ser: 0.67 mg/dL (ref 0.61–1.24)
GFR calc Af Amer: >60 mL/min (ref >60)
Glucose, Bld: 101 mg/dL — ABNORMAL HIGH (ref 65–99)
POTASSIUM: 4.3 mmol/L (ref 3.5–5.1)
SODIUM: 128 mmol/L — AB (ref 135–145)

## 2016-01-12 LAB — CBC
HCT: 35.2 % — ABNORMAL LOW (ref 39.0–52.0)
Hemoglobin: 11.6 g/dL — ABNORMAL LOW (ref 13.0–17.0)
MCH: 31.7 pg (ref 26.0–34.0)
MCHC: 33 g/dL (ref 30.0–36.0)
MCV: 96.2 fL (ref 78.0–100.0)
PLATELETS: 175 10*3/uL (ref 150–400)
RBC: 3.66 MIL/uL — AB (ref 4.22–5.81)
RDW: 13.1 % (ref 11.5–15.5)
WBC: 6 10*3/uL (ref 4.0–10.5)

## 2016-01-12 LAB — PROTIME-INR
INR: 2.11 — ABNORMAL HIGH (ref 0.00–1.49)
PROTHROMBIN TIME: 23.5 s — AB (ref 11.6–15.2)

## 2016-01-12 LAB — URINE CULTURE

## 2016-01-12 MED ORDER — SODIUM CHLORIDE 0.9 % IV SOLN
INTRAVENOUS | Status: DC
  Administered 2016-01-12 – 2016-01-13 (×3): via INTRAVENOUS

## 2016-01-12 MED ORDER — WARFARIN SODIUM 5 MG PO TABS
2.5000 mg | ORAL_TABLET | Freq: Once | ORAL | Status: AC
  Administered 2016-01-12: 2.5 mg via ORAL
  Filled 2016-01-12: qty 1

## 2016-01-12 NOTE — Progress Notes (Signed)
[facility] PROGRESS NOTE  [name] ZOX:[id number] DOB: 1930/04/29    PCP:   Rudi Heap, MD   HPI:  Patient with dementia, AMS, anxiety, essential tremor, prior CVA and afib on anticoagulation, admitted for falling, and hyponatremia. He also needs placement. His Na improved some but is still low and he is receiving IV NS. He has no complaints, but wanted to go home.  Unfortunately, he has dementia, and his legal guardian would like to have him in the SNF.  He lives with his wife who has dementia as well.    Rewiew of Systems:  Constitutional: Negative for malaise, fever and chills. No significant weight loss or weight gain Eyes: Negative for eye pain, redness and discharge, diplopia, visual changes, or flashes of light. ENMT: Negative for ear pain, hoarseness, nasal congestion, sinus pressure and sore throat. No headaches; tinnitus, drooling, or problem swallowing. Cardiovascular: Negative for chest pain, palpitations, diaphoresis, dyspnea and peripheral edema. ; No orthopnea, PND Respiratory: Negative for cough, hemoptysis, wheezing and stridor. No pleuritic chestpain. Gastrointestinal: Negative for nausea, vomiting, diarrhea, constipation, abdominal pain, melena, blood in stool, hematemesis, jaundice and rectal bleeding.    Genitourinary: Negative for frequency, dysuria, incontinence,flank pain and hematuria; Musculoskeletal: Negative for back pain and neck pain. Negative for swelling and trauma.;  Skin: . Negative for pruritus, rash, abrasions, bruising and skin lesion.; ulcerations Neuro: Negative for headache, lightheadedness and neck stiffness. Negative for weakness, altered level of consciousness , altered mental status, extremity weakness, burning feet, involuntary movement, seizure and syncope.  Psych: negative for anxiety, depression, insomnia, tearfulness, panic attacks, hallucinations, paranoia, suicidal or homicidal ideation    Past Medical History  Diagnosis  Date  . Dyslipidemia   . Anxiety   . GIB (gastrointestinal bleeding)   . Obesity   . UC (ulcerative colitis confined to rectum) (HCC)     Left-sided  . BPH (benign prostatic hyperplasia)   . Diverticulosis   . GERD (gastroesophageal reflux disease)   . Hemorrhoids   . Diabetes mellitus   . Tremor   . CAD (coronary artery disease)     Anterior MI and DES stenting 2004, Vfib arrest; left main 25% stenosis, LAD 80% stenosis, 99% stenosis, 25% circumflex stenosis, 25% ramus intermedius stenosis, 50-40% right coronary artery stenosis, 90% RV branch stenosis with right to left collaterals; EF 55% with mild naterior hypokinesis (underwent Taxus stenting at the time) . Stress perfusion study January 2012 EF 48% with old apical infarct  . Hyperlipidemia   . Hyperplastic colon polyp   . Myocardial infarction (HCC)   . Hypertension   . Memory deficits 05/05/2013  . Essential and other specified forms of tremor 05/05/2013  . Hearing deficit     Wears hearing aids  . Cataract   . Atrial fibrillation, persistent (HCC)   . Tremor, essential 12/11/2015  . Stroke [facility])     Past Surgical History  Procedure Laterality Date  . Hammer toe surgery      right  . Inguinal hernia repair      right, bilateral  . Coronary angioplasty with stent placement      Taxus stenting  . Cataract extraction w/phaco  10/27/2012    Procedure: CATARACT EXTRACTION PHACO AND INTRAOCULAR LENS PLACEMENT (IOC);  Surgeon: Gemma Payor, MD;  Location: [facility];  Service: Ophthalmology;  Laterality: Left;  CDE 22.67    Medications:  HOME MEDS: Prior to Admission medications   Medication Sig Start Date End Date Taking? Authorizing Provider  atorvastatin (LIPITOR)  40 MG tablet TAKE ONE TABLET AT BEDTIME 10/04/15  Yes [name], MD  Cholecalciferol (VITAMIN D) 1000 UNITS capsule Take 1 capsule (1,000 Units total) by mouth daily. 06/26/13  Yes Tammy Eckard, PHARMD  digoxin (LANOXIN) 0.125 MG tablet TAKE 1 TABLET IN THE  MORNING 10/08/15  Yes [name], MD  metoprolol (LOPRESSOR) 100 MG tablet TAKE 1 & 1/2 TABLETS TWICE A DAY 01/06/16  Yes [name], MD  mirabegron ER (MYRBETRIQ) 25 MG TB24 tablet Take 1 tablet (25 mg total) by mouth daily. 09/18/15  Yes Tammy Eckard, PHARMD  Multiple Vitamin (MULTIVITAMIN WITH MINERALS) TABS tablet Take 1 tablet by mouth daily. 08/28/15  Yes [name], MD  PARoxetine (PAXIL) 30 MG tablet TAKE 1 TABLET IN THE MORNING 12/03/15  Yes [name], MD  sulfaSALAzine (AZULFIDINE) 500 MG EC tablet Take 1 tablet (500 mg total) by mouth 2 (two) times daily. Take 500 mg twice a day by mouth for 1 week, then 1000 mg BID Patient taking differently: Take 500 mg by mouth every morning.  11/02/15  Yes [name], MD  vitamin B-12 (CYANOCOBALAMIN) 1000 MCG tablet TAKE 1 TABLET IN THE MORNING 10/08/15  Yes [name], MD  warfarin (COUMADIN) 5 MG tablet Takes as directed Patient taking differently: take one tablet on Tuesdays and Thursdays, then take one-half tablet on all other days 11/01/15  Yes [name], MD  hydrOXYzine (ATARAX/VISTARIL) 10 MG tablet Take one-half tablet daily as needed Patient not taking: Reported on 01/10/2016 01/03/16   [name], MD  nitroGLYCERIN (NITROSTAT) 0.4 MG SL tablet Place 1 tablet (0.4 mg total) under the tongue every 5 (five) minutes as needed for chest pain. 10/08/14   [name], MD     Allergies:  Allergies  Allergen Reactions  . Asa-Apap-Salicyl-Caff     Takes warfarin  . Codeine Other (See Comments)    Unknown reaction  . Nsaids Other (See Comments)    On warfarin  . Sulfonamide Derivatives Nausea And Vomiting    Social History:   reports that he quit smoking about 57 years ago. He has quit using smokeless tobacco. His smokeless tobacco use included Chew. He reports that he does not drink alcohol or use illicit drugs.  Family History: Family History  Problem Relation Age of Onset  . Colon cancer Neg Hx   .  Alzheimer's disease Brother   . Diabetes Brother     x 4  . Diabetes Mother     ?  . Diabetes Father   . Heart disease Father   . Heart disease Paternal Grandfather   . Heart disease Brother     x 3  . COPD Sister   . Diabetes Sister      Physical Exam: Filed Vitals:   01/11/16 1505 01/11/16 2203 01/12/16 0539 01/12/16 1411  BP: 140/80 143/73 144/87 153/86  Pulse: 91 95 91 92  Temp: 98.4 F (36.9 C) 98.2 F (36.8 C) 97.7 F (36.5 C) 98.2 F (36.8 C)  TempSrc: Axillary Oral Oral Oral  Resp: 20 20 20 20   Height:      Weight:      SpO2: 93% 93% 97% 97%   Blood pressure 153/86, pulse 92, temperature 98.2 F (36.8 C), temperature source Oral, resp. rate 20, height 5\' 9"  (1.753 m), weight 68.04 kg (150 lb), SpO2 97 %.  GEN:  Pleasant  patient lying in the stretcher in no acute distress; cooperative with  exam. PSYCH:  alert but is confused.  does not appear anxious or depressed; affect is appropriate. HEENT: Mucous membranes pink and anicteric; PERRLA; EOM intact; no cervical lymphadenopathy nor thyromegaly or carotid bruit; no JVD; There were no stridor. Neck is very supple. Breasts:: Not examined CHEST WALL: No tenderness CHEST: Normal respiration, clear to auscultation bilaterally.  HEART: Regular rate and rhythm.  There are no murmur, rub, or gallops.   BACK: No kyphosis or scoliosis; no CVA tenderness ABDOMEN: soft and non-tender; no masses, no organomegaly, normal abdominal bowel sounds; no pannus; no intertriginous candida. There is no rebound and no distention. Rectal Exam: Not done EXTREMITIES: No bone or joint deformity; age-appropriate arthropathy of the hands and knees; no edema; no ulcerations.  There is no calf tenderness. Genitalia: not examined PULSES: 2+ and symmetric SKIN: Normal hydration no rash or ulceration CNS: Cranial nerves 2-12 grossly intact no focal lateralizing neurologic deficit.  Speech is fluent; uvula elevated with phonation, facial symmetry  and tongue midline. DTR are normal bilaterally, cerebella exam is intact, barbinski is negative and strengths are equaled bilaterally.  No sensory loss.   Labs on Admission:  Basic Metabolic Panel:  Recent Labs Lab 01/10/16 0941 01/11/16 0719 01/12/16 0636  NA 127* 129* 128*  K 4.2 4.2 4.3  CL 88* 91* 93*  CO2 33* 32 27  GLUCOSE 110* 106* 101*  BUN CREATININE 0.78 0.65 0.67  CALCIUM 8.6* 8.4* 8.1*   Liver Function Tests:  Recent Labs Lab 01/10/16 0941  AST 25  ALT 17  ALKPHOS 64  BILITOT 1.1  PROT 6.7  ALBUMIN 3.8   CBC:  Recent Labs Lab 01/10/16 0947 01/11/16 0719 01/12/16 0636  WBC 7.0 4.9 6.0  HGB 12.3* 12.4* 11.6*  HCT 36.8* 37.4* 35.2*  MCV 96.8 97.1 96.2  PLT 204 195 175    CBG:  Recent Labs Lab 01/10/16 0911  GLUCAP 96   Assessment/Plan Present on Admission:  . Fall . Atrial fibrillation, persistent (HCC) . Cerebrovascular disease, unspecified . Hyponatremia . Essential tremor . Memory deficits  PLAN:  Hyponatremia:  Continue with IVF.  Follow Na. Dementia:  Will continue with SNF per Guardian.  Essential tremor:  Stable.  Afib:  On anticoagulation per pharmacy.   Other plans as per orders.  Code Status: DNR.    Houston Siren, MD.  FACP [facility] Pager [phone] 7pm to 7am.  01/12/2016, 2:33 PM

## 2016-01-12 NOTE — Progress Notes (Signed)
ANTICOAGULATION CONSULT NOTE -  Pharmacy Consult for Coumadin Indication: atrial fibrillation  Allergies  Allergen Reactions  . Asa-Apap-Salicyl-Caff     Takes warfarin  . Codeine Other (See Comments)    Unknown reaction  . Nsaids Other (See Comments)    On warfarin  . Sulfonamide Derivatives Nausea And Vomiting    Patient Measurements: Height:  (175.3 cm) Weight: 150 lb (68.04 kg) IBW/kg (Calculated) : 70.7 Heparin Dosing Weight:   Vital Signs: Temp: 97.7 F (36.5 C) (02/19 0539) Temp Source: Oral (02/19 0539) BP: 144/87 mmHg (02/19 0539) Pulse Rate: 91 (02/19 0539)  Labs:  Recent Labs  01/10/16 0941  01/10/16 0947 01/10/16 1547 01/11/16 0719 01/12/16 0636  HGB  --   < > 12.3*  --  12.4* 11.6*  HCT  --   --  36.8*  --  37.4* 35.2*  PLT  --   --  204  --  195 175  LABPROT  --   --   --  21.6* 21.1* 23.5*  INR  --   --   --  1.89* 1.83* 2.11*  CREATININE 0.78  --   --   --  0.65 0.67  < > = values in this interval not displayed.  Estimated Creatinine Clearance: 67.3 mL/min (by C-G formula based on Cr of 0.67).   Medical History: Past Medical History  Diagnosis Date  . Dyslipidemia   . Anxiety   . GIB (gastrointestinal bleeding)   . Obesity   . UC (ulcerative colitis confined to rectum) (HCC)     Left-sided  . BPH (benign prostatic hyperplasia)   . Diverticulosis   . GERD (gastroesophageal reflux disease)   . Hemorrhoids   . Diabetes mellitus   . Tremor   . CAD (coronary artery disease)     Anterior MI and DES stenting 2004, Vfib arrest; left main 25% stenosis, LAD 80% stenosis, 99% stenosis, 25% circumflex stenosis, 25% ramus intermedius stenosis, 50-40% right coronary artery stenosis, 90% RV branch stenosis with right to left collaterals; EF 55% with mild naterior hypokinesis (underwent Taxus stenting at the time) . Stress perfusion study January 2012 EF 48% with old apical infarct  . Hyperlipidemia   . Hyperplastic colon polyp   . Myocardial  infarction (HCC)   . Hypertension   . Memory deficits 05/05/2013  . Essential and other specified forms of tremor 05/05/2013  . Hearing deficit     Wears hearing aids  . Cataract   . Atrial fibrillation, persistent (HCC)   . Tremor, essential 12/11/2015  . Stroke [facility])     Medications:  Prescriptions prior to admission  Medication Sig Dispense Refill Last Dose  . atorvastatin (LIPITOR) 40 MG tablet TAKE ONE TABLET AT BEDTIME 30 tablet 4 01/09/2016 at Unknown time  . Cholecalciferol (VITAMIN D) 1000 UNITS capsule Take 1 capsule (1,000 Units total) by mouth daily.   01/09/2016 at Unknown time  . digoxin (LANOXIN) 0.125 MG tablet TAKE 1 TABLET IN THE MORNING 30 tablet 4 01/09/2016 at Unknown time  . metoprolol (LOPRESSOR) 100 MG tablet TAKE 1 & 1/2 TABLETS TWICE A DAY 135 tablet 1 01/09/2016 at Unknown time  . mirabegron ER (MYRBETRIQ) 25 MG TB24 tablet Take 1 tablet (25 mg total) by mouth daily. 28 tablet 0 01/09/2016 at Unknown time  . Multiple Vitamin (MULTIVITAMIN WITH MINERALS) TABS tablet Take 1 tablet by mouth daily. 30 tablet 11 01/09/2016 at Unknown time  . PARoxetine (PAXIL) 30 MG tablet TAKE 1 TABLET IN THE  MORNING 30 tablet 0 01/09/2016 at Unknown time  . sulfaSALAzine (AZULFIDINE) 500 MG EC tablet Take 1 tablet (500 mg total) by mouth 2 (two) times daily. Take 500 mg twice a day by mouth for 1 week, then 1000 mg BID (Patient taking differently: Take 500 mg by mouth every morning. ) 60 tablet 0 01/09/2016 at Unknown time  . vitamin B-12 (CYANOCOBALAMIN) 1000 MCG tablet TAKE 1 TABLET IN THE MORNING 30 tablet 4 01/09/2016 at Unknown time  . warfarin (COUMADIN) 5 MG tablet Takes as directed (Patient taking differently: take one tablet on Tuesdays and Thursdays, then take one-half tablet on all other days) 25 tablet 2 01/09/2016 at Unknown time  . hydrOXYzine (ATARAX/VISTARIL) 10 MG tablet Take one-half tablet daily as needed (Patient not taking: Reported on 01/10/2016) 15 tablet 0 Not Taking at  Unknown time  . nitroGLYCERIN (NITROSTAT) 0.4 MG SL tablet Place 1 tablet (0.4 mg total) under the tongue every 5 (five) minutes as needed for chest pain. 25 tablet 1 Taking    Assessment: Patient discharged from [facility] 12/03/2015.  Present Med Rec Coumadin dosing same as anticoagulation dose as listed below. INR therapeutic today   Anticoagulation Dose Instructions as of 10/29/2015       Total Sun Mon Tue Wed Thu Fri Sat    New Dose 22.5 mg 2.5 mg 2.5 mg 5 mg 2.5 mg 5 mg 2.5 mg 2.5 mg      (5 mg x 0.5)  (5 mg x 0.5)  (5 mg x 1)  (5 mg x 0.5)  (5 mg x 1)  (5 mg x 0.5)  (5 mg x 0.5)                  Goal of Therapy:  INR 2-3 Monitor platelets by anticoagulation protocol: Yes   Plan:  Coumadin 2.5 mg po x 1 dose today (home regiment) INR/PT daily  Raquel James, Angelica Frandsen Bennett 01/12/2016,12:38 PM

## 2016-01-13 LAB — BASIC METABOLIC PANEL
Anion gap: 7 (ref 5–15)
BUN: 9 mg/dL (ref 6–20)
CO2: 30 mmol/L (ref 22–32)
CREATININE: 0.76 mg/dL (ref 0.61–1.24)
Calcium: 8.2 mg/dL — ABNORMAL LOW (ref 8.9–10.3)
Chloride: 91 mmol/L — ABNORMAL LOW (ref 101–111)
GFR calc Af Amer: >60 mL/min (ref >60)
GLUCOSE: 103 mg/dL — AB (ref 65–99)
POTASSIUM: 3.9 mmol/L (ref 3.5–5.1)
Sodium: 128 mmol/L — ABNORMAL LOW (ref 135–145)

## 2016-01-13 LAB — PROTIME-INR
INR: 2.08 — ABNORMAL HIGH (ref 0.00–1.49)
Prothrombin Time: 23.3 seconds — ABNORMAL HIGH (ref 11.6–15.2)

## 2016-01-13 LAB — CBC
HCT: 34.2 % — ABNORMAL LOW (ref 39.0–52.0)
HEMOGLOBIN: 11.7 g/dL — AB (ref 13.0–17.0)
MCH: 32.8 pg (ref 26.0–34.0)
MCHC: 34.2 g/dL (ref 30.0–36.0)
MCV: 95.8 fL (ref 78.0–100.0)
PLATELETS: 183 10*3/uL (ref 150–400)
RBC: 3.57 MIL/uL — AB (ref 4.22–5.81)
RDW: 13.1 % (ref 11.5–15.5)
WBC: 7.8 10*3/uL (ref 4.0–10.5)

## 2016-01-13 MED ORDER — ACETAMINOPHEN 325 MG PO TABS
650.0000 mg | ORAL_TABLET | Freq: Four times a day (QID) | ORAL | Status: DC | PRN
  Administered 2016-01-13: 650 mg via ORAL
  Filled 2016-01-13: qty 2

## 2016-01-13 MED ORDER — WARFARIN SODIUM 5 MG PO TABS
2.5000 mg | ORAL_TABLET | Freq: Once | ORAL | Status: AC
  Administered 2016-01-13: 2.5 mg via ORAL
  Filled 2016-01-13: qty 1

## 2016-01-13 MED ORDER — TRAMADOL HCL 50 MG PO TABS: 50.0000 mg | ORAL_TABLET | Freq: Four times a day (QID) | ORAL | Status: DC | PRN

## 2016-01-13 MED ORDER — FUROSEMIDE 10 MG/ML IJ SOLN
40.0000 mg | Freq: Two times a day (BID) | INTRAMUSCULAR | Status: DC
  Administered 2016-01-13 – 2016-01-14 (×2): 40 mg via INTRAVENOUS
  Filled 2016-01-13 (×3): qty 4

## 2016-01-13 NOTE — Progress Notes (Signed)
ANTICOAGULATION CONSULT NOTE -  Pharmacy Consult for Coumadin Indication: atrial fibrillation  Allergies  Allergen Reactions  . Asa-Apap-Salicyl-Caff     Takes warfarin  . Codeine Other (See Comments)    Unknown reaction  . Nsaids Other (See Comments)    On warfarin  . Sulfonamide Derivatives Nausea And Vomiting    Patient Measurements: Height:  (175.3 cm) Weight: 150 lb (68.04 kg) IBW/kg (Calculated) : 70.7  Vital Signs: Temp: 98.5 F (36.9 C) (02/20 0528) Temp Source: Oral (02/20 0528) BP: 138/69 mmHg (02/20 0528) Pulse Rate: 91 (02/20 0528)  Labs:  Recent Labs  01/11/16 0719 01/12/16 0636 01/13/16 0539  HGB 12.4* 11.6* 11.7*  HCT 37.4* 35.2* 34.2*  PLT 195 175 183  LABPROT 21.1* 23.5* 23.3*  INR 1.83* 2.11* 2.08*  CREATININE 0.65 0.67 0.76    Estimated Creatinine Clearance: 67.3 mL/min (by C-G formula based on Cr of 0.76).   Medical History: Past Medical History  Diagnosis Date  . Dyslipidemia   . Anxiety   . GIB (gastrointestinal bleeding)   . Obesity   . UC (ulcerative colitis confined to rectum) (HCC)     Left-sided  . BPH (benign prostatic hyperplasia)   . Diverticulosis   . GERD (gastroesophageal reflux disease)   . Hemorrhoids   . Diabetes mellitus   . Tremor   . CAD (coronary artery disease)     Anterior MI and DES stenting 2004, Vfib arrest; left main 25% stenosis, LAD 80% stenosis, 99% stenosis, 25% circumflex stenosis, 25% ramus intermedius stenosis, 50-40% right coronary artery stenosis, 90% RV branch stenosis with right to left collaterals; EF 55% with mild naterior hypokinesis (underwent Taxus stenting at the time) . Stress perfusion study January 2012 EF 48% with old apical infarct  . Hyperlipidemia   . Hyperplastic colon polyp   . Myocardial infarction (HCC)   . Hypertension   . Memory deficits 05/05/2013  . Essential and other specified forms of tremor 05/05/2013  . Hearing deficit     Wears hearing aids  . Cataract   .  Atrial fibrillation, persistent (HCC)   . Tremor, essential 12/11/2015  . Stroke [facility])     Medications:  Prescriptions prior to admission  Medication Sig Dispense Refill Last Dose  . atorvastatin (LIPITOR) 40 MG tablet TAKE ONE TABLET AT BEDTIME 30 tablet 4 01/09/2016 at Unknown time  . Cholecalciferol (VITAMIN D) 1000 UNITS capsule Take 1 capsule (1,000 Units total) by mouth daily.   01/09/2016 at Unknown time  . digoxin (LANOXIN) 0.125 MG tablet TAKE 1 TABLET IN THE MORNING 30 tablet 4 01/09/2016 at Unknown time  . metoprolol (LOPRESSOR) 100 MG tablet TAKE 1 & 1/2 TABLETS TWICE A DAY 135 tablet 1 01/09/2016 at Unknown time  . mirabegron ER (MYRBETRIQ) 25 MG TB24 tablet Take 1 tablet (25 mg total) by mouth daily. 28 tablet 0 01/09/2016 at Unknown time  . Multiple Vitamin (MULTIVITAMIN WITH MINERALS) TABS tablet Take 1 tablet by mouth daily. 30 tablet 11 01/09/2016 at Unknown time  . PARoxetine (PAXIL) 30 MG tablet TAKE 1 TABLET IN THE MORNING 30 tablet 0 01/09/2016 at Unknown time  . sulfaSALAzine (AZULFIDINE) 500 MG EC tablet Take 1 tablet (500 mg total) by mouth 2 (two) times daily. Take 500 mg twice a day by mouth for 1 week, then 1000 mg BID (Patient taking differently: Take 500 mg by mouth every morning. ) 60 tablet 0 01/09/2016 at Unknown time  . vitamin B-12 (CYANOCOBALAMIN) 1000 MCG tablet TAKE 1  TABLET IN THE MORNING 30 tablet 4 01/09/2016 at Unknown time  . warfarin (COUMADIN) 5 MG tablet Takes as directed (Patient taking differently: take one tablet on Tuesdays and Thursdays, then take one-half tablet on all other days) 25 tablet 2 01/09/2016 at Unknown time  . hydrOXYzine (ATARAX/VISTARIL) 10 MG tablet Take one-half tablet daily as needed (Patient not taking: Reported on 01/10/2016) 15 tablet 0 Not Taking at Unknown time  . nitroGLYCERIN (NITROSTAT) 0.4 MG SL tablet Place 1 tablet (0.4 mg total) under the tongue every 5 (five) minutes as needed for chest pain. 25 tablet 1 Taking     Assessment: Patient discharged from [facility] 12/03/2015.  Present Med Rec Coumadin dosing same as anticoagulation dose as listed below. INR therapeutic today at 2.06  Anticoagulation Dose Instructions as of 10/29/2015       Total Sun Mon Tue Wed Thu Fri Sat    New Dose 22.5 mg 2.5 mg 2.5 mg 5 mg 2.5 mg 5 mg 2.5 mg 2.5 mg      (5 mg x 0.5)  (5 mg x 0.5)  (5 mg x 1)  (5 mg x 0.5)  (5 mg x 1)  (5 mg x 0.5)  (5 mg x 0.5)                  Goal of Therapy:  INR 2-3 Monitor platelets by anticoagulation protocol: Yes   Plan:  Coumadin 2.5 mg po x 1 dose today INR/PT daily Monitor for S/S bleeding.  Elder Cyphers, BS Loura Back, BCPS Clinical Pharmacist Pager [phone] 01/13/2016,1:15 PM

## 2016-01-13 NOTE — NC FL2 (Signed)
[place] MEDICAID FL2 LEVEL OF CARE SCREENING TOOL     IDENTIFICATION  Patient Name: [name] Birthdate: Mar 24, 1930 Sex: male Admission Date (Current Location): 01/10/2016  [facility] Site and [place] Number:  [employer] and Address:  [place],  [address], [place] [address]      Provider Number: [id number]  Attending Physician Name and Address:  Houston Siren, MD  Relative Name and Phone Number:       Current Level of Care: Hospital Recommended Level of Care: Skilled Nursing Facility Prior Approval Number:    Date Approved/Denied:   PASRR Number:  [id number])  Discharge Plan: SNF    Current Diagnoses: Patient Active Problem List   Diagnosis Date Noted  . Fall 01/10/2016  . Hyponatremia 01/10/2016  . Tremor, essential 12/11/2015  . Altered mental status 11/30/2015  . Type 2 diabetes, diet controlled (HCC) 06/28/2015  . Cerebrovascular disease 12/05/2014  . ASCVD (arteriosclerotic cardiovascular disease) 12/05/2014  . Persistent atrial fibrillation (HCC) 12/05/2014  . High risk medication use 06/22/2013  . Hypokalemia 06/22/2013  . Memory deficits 05/05/2013  . Essential tremor 05/05/2013  . Cerebrovascular disease, unspecified 01/16/2013  . CAD (coronary artery disease)   . BPH (benign prostatic hypertrophy) 02/11/2011  . Premature atrial complexes 02/11/2011  . Panic attack 02/11/2011  . Hyperlipidemia 02/12/2010  . Atrial fibrillation, persistent (HCC) 10/30/2009  . GERD 03/12/2009  . ULCERATIVE COLITIS-LEFT SIDE 03/12/2009    Orientation RESPIRATION BLADDER Height & Weight     Self, Time, Place  Normal Incontinent Weight: 150 lb (68.04 kg) Height:   (175.3 cm)  BEHAVIORAL SYMPTOMS/MOOD NEUROLOGICAL BOWEL NUTRITION STATUS      Incontinent Diet (Soft)  AMBULATORY STATUS COMMUNICATION OF NEEDS Skin   Extensive Assist Verbally Normal                       Personal Care Assistance Level of Assistance  Bathing,  Dressing Bathing Assistance: Maximum assistance   Dressing Assistance: Maximum assistance     Functional Limitations Info             SPECIAL CARE FACTORS FREQUENCY                       Contractures      Additional Factors Info  Code Status, Allergies, Psychotropic Code Status Info:  (DNR) Allergies Info:  (Asa-apap-salic-caff, Codeine, Sulfonamide Derivatives, Nsaids.) Psychotropic Info:  (Paxil)         Current Medications (01/13/2016):  This is the current hospital active medication list Current Facility-Administered Medications  Medication Dose Route Frequency Provider Last Rate Last Dose  . 0.9 %  sodium chloride infusion   Intravenous Continuous Houston Siren, MD 100 mL/hr at 01/13/16 0600    . atorvastatin (LIPITOR) tablet 40 mg  40 mg Oral QHS Houston Siren, MD   40 mg at 01/12/16 2020  . digoxin (LANOXIN) tablet 125 mcg  125 mcg Oral q morning - 10a Houston Siren, MD   125 mcg at 01/13/16 1223  . metoprolol (LOPRESSOR) tablet 50 mg  50 mg Oral BID Houston Siren, MD   50 mg at 01/13/16 1223  . mirabegron ER (MYRBETRIQ) tablet 25 mg  25 mg Oral Daily Houston Siren, MD   25 mg at 01/13/16 1223  . PARoxetine (PAXIL) tablet 30 mg  30 mg Oral q morning - 10a Houston Siren, MD   30 mg at 01/13/16 1224  . sulfaSALAzine (AZULFIDINE) EC tablet  500 mg  500 mg Oral q morning - 10a Houston Siren, MD   500 mg at 01/13/16 1222  . vitamin B-12 (CYANOCOBALAMIN) tablet 1,000 mcg  1,000 mcg Oral q morning - 10a Houston Siren, MD   1,000 mcg at 01/13/16 1223  . Warfarin - Pharmacist Dosing Inpatient   Does not apply Q24H Houston Siren, MD   0  at 01/11/16 1200     Discharge Medications: Please see discharge summary for a list of discharge medications.  Relevant Imaging Results:  Relevant Lab Results:   Additional Information    Malayshia All, Juleen China, LCSW

## 2016-01-13 NOTE — Progress Notes (Signed)
[facility] PROGRESS NOTE  [name] ZOX:[id number] DOB: [birth year]    PCP:   Rudi Heap, MD   HPI: Patient with dementia, AMS, anxiety, essential tremor, prior CVA and afib on anticoagulation, admitted for falling, and hyponatremia. He also needs placement. His Na improved some but is still low and he is receiving IV NS. He has no complaints, but wanted to go home. Unfortunately, he has dementia, and his legal guardian would like to have him in the SNF. He lives with his wife who has dementia as well.  He is medically stable for placement.    Rewiew of Systems:  Constitutional: Negative for malaise, fever and chills. No significant weight loss or weight gain Eyes: Negative for eye pain, redness and discharge, diplopia, visual changes, or flashes of light. ENMT: Negative for ear pain, hoarseness, nasal congestion, sinus pressure and sore throat. No headaches; tinnitus, drooling, or problem swallowing. Cardiovascular: Negative for chest pain, palpitations, diaphoresis, dyspnea and peripheral edema. ; No orthopnea, PND Respiratory: Negative for cough, hemoptysis, wheezing and stridor. No pleuritic chestpain. Gastrointestinal: Negative for nausea, vomiting, diarrhea, constipation, abdominal pain, melena, blood in stool, hematemesis, jaundice and rectal bleeding.    Genitourinary: Negative for frequency, dysuria, incontinence,flank pain and hematuria; Musculoskeletal: Negative for back pain and neck pain. Negative for swelling and trauma.;  Skin: . Negative for pruritus, rash, abrasions, bruising and skin lesion.; ulcerations Neuro: Negative for headache, lightheadedness and neck stiffness. Negative for weakness, altered level of consciousness , altered mental status, extremity weakness, burning feet, involuntary movement, seizure and syncope.  Psych: negative for anxiety, depression, insomnia, tearfulness, panic attacks, hallucinations, paranoia, suicidal or homicidal  ideation   Past Medical History  Diagnosis Date  . Dyslipidemia   . Anxiety   . GIB (gastrointestinal bleeding)   . Obesity   . UC (ulcerative colitis confined to rectum) (HCC)     Left-sided  . BPH (benign prostatic hyperplasia)   . Diverticulosis   . GERD (gastroesophageal reflux disease)   . Hemorrhoids   . Diabetes mellitus   . Tremor   . CAD (coronary artery disease)     Anterior MI and DES stenting 2004, Vfib arrest; left main 25% stenosis, LAD 80% stenosis, 99% stenosis, 25% circumflex stenosis, 25% ramus intermedius stenosis, 50-40% right coronary artery stenosis, 90% RV branch stenosis with right to left collaterals; EF 55% with mild naterior hypokinesis (underwent Taxus stenting at the time) . Stress perfusion study January 2012 EF 48% with old apical infarct  . Hyperlipidemia   . Hyperplastic colon polyp   . Myocardial infarction (HCC)   . Hypertension   . Memory deficits 05/05/2013  . Essential and other specified forms of tremor 05/05/2013  . Hearing deficit     Wears hearing aids  . Cataract   . Atrial fibrillation, persistent (HCC)   . Tremor, essential 12/11/2015  . Stroke [facility])     Past Surgical History  Procedure Laterality Date  . Hammer toe surgery      right  . Inguinal hernia repair      right, bilateral  . Coronary angioplasty with stent placement      Taxus stenting  . Cataract extraction w/phaco  10/27/2012    Procedure: CATARACT EXTRACTION PHACO AND INTRAOCULAR LENS PLACEMENT (IOC);  Surgeon: Gemma Payor, MD;  Location: [facility];  Service: Ophthalmology;  Laterality: Left;  CDE 22.67    Medications:  HOME MEDS: Prior to Admission medications   Medication Sig Start Date End Date Taking? Authorizing Provider  atorvastatin (LIPITOR) 40 MG tablet TAKE ONE TABLET AT BEDTIME 10/04/15  Yes [name], MD  Cholecalciferol (VITAMIN D) 1000 UNITS capsule Take 1 capsule (1,000 Units total) by mouth daily. 06/26/13  Yes Tammy Eckard, PHARMD  digoxin  (LANOXIN) 0.125 MG tablet TAKE 1 TABLET IN THE MORNING 10/08/15  Yes [name], MD  metoprolol (LOPRESSOR) 100 MG tablet TAKE 1 & 1/2 TABLETS TWICE A DAY 01/06/16  Yes [name], MD  mirabegron ER (MYRBETRIQ) 25 MG TB24 tablet Take 1 tablet (25 mg total) by mouth daily. 09/18/15  Yes Tammy Eckard, PHARMD  Multiple Vitamin (MULTIVITAMIN WITH MINERALS) TABS tablet Take 1 tablet by mouth daily. 08/28/15  Yes [name], MD  PARoxetine (PAXIL) 30 MG tablet TAKE 1 TABLET IN THE MORNING 12/03/15  Yes [name], MD  sulfaSALAzine (AZULFIDINE) 500 MG EC tablet Take 1 tablet (500 mg total) by mouth 2 (two) times daily. Take 500 mg twice a day by mouth for 1 week, then 1000 mg BID Patient taking differently: Take 500 mg by mouth every morning.  11/02/15  Yes [name], MD  vitamin B-12 (CYANOCOBALAMIN) 1000 MCG tablet TAKE 1 TABLET IN THE MORNING 10/08/15  Yes [name], MD  warfarin (COUMADIN) 5 MG tablet Takes as directed Patient taking differently: take one tablet on Tuesdays and Thursdays, then take one-half tablet on all other days 11/01/15  Yes [name], MD  hydrOXYzine (ATARAX/VISTARIL) 10 MG tablet Take one-half tablet daily as needed Patient not taking: Reported on 01/10/2016 01/03/16   [name], MD  nitroGLYCERIN (NITROSTAT) 0.4 MG SL tablet Place 1 tablet (0.4 mg total) under the tongue every 5 (five) minutes as needed for chest pain. 10/08/14   [name], MD     Allergies:  Allergies  Allergen Reactions  . Asa-Apap-Salicyl-Caff     Takes warfarin  . Codeine Other (See Comments)    Unknown reaction  . Nsaids Other (See Comments)    On warfarin  . Sulfonamide Derivatives Nausea And Vomiting    Social History:   reports that he quit smoking about 57 years ago. He has quit using smokeless tobacco. His smokeless tobacco use included Chew. He reports that he does not drink alcohol or use illicit drugs.  Family History: Family History  Problem  Relation Age of Onset  . Colon cancer Neg Hx   . Alzheimer's disease Brother   . Diabetes Brother     x 4  . Diabetes Mother     ?  . Diabetes Father   . Heart disease Father   . Heart disease Paternal Grandfather   . Heart disease Brother     x 3  . COPD Sister   . Diabetes Sister      Physical Exam: Filed Vitals:   01/12/16 2247 01/13/16 0528 01/13/16 1300 01/13/16 1519  BP: 144/79 138/69  150/84  Pulse: 80 91  93  Temp: 98.1 F (36.7 C) 98.5 F (36.9 C)  98.7 F (37.1 C)  TempSrc: Oral Oral  Oral  Resp: 20 20  20   Height:      Weight:      SpO2: 98% 97% 93% 95%   Blood pressure 150/84, pulse 93, temperature 98.7 F (37.1 C), temperature source Oral, resp. rate 20, height 5\' 9"  (1.753 m), weight 68.04 kg (150 lb), SpO2 95 %.  GEN:  Pleasant  patient lying in the stretcher in no acute distress; cooperative with exam.  PSYCH:  alert and oriented x4; does not appear anxious or depressed; affect is appropriate. HEENT: Mucous membranes pink and anicteric; PERRLA; EOM intact; no cervical lymphadenopathy nor thyromegaly or carotid bruit; no JVD; There were no stridor. Neck is very supple. Breasts:: Not examined CHEST WALL: No tenderness CHEST: Normal respiration, clear to auscultation bilaterally.  HEART: Regular rate and rhythm.  There are no murmur, rub, or gallops.   BACK: No kyphosis or scoliosis; no CVA tenderness ABDOMEN: soft and non-tender; no masses, no organomegaly, normal abdominal bowel sounds; no pannus; no intertriginous candida. There is no rebound and no distention. Rectal Exam: Not done EXTREMITIES: No bone or joint deformity; age-appropriate arthropathy of the hands and knees; no edema; no ulcerations.  There is no calf tenderness. Genitalia: not examined PULSES: 2+ and symmetric SKIN: Normal hydration no rash or ulceration CNS: Cranial nerves 2-12 grossly intact no focal lateralizing neurologic deficit.  Speech is fluent; uvula elevated with phonation,  facial symmetry and tongue midline. DTR are normal bilaterally, cerebella exam is intact, barbinski is negative and strengths are equaled bilaterally.  No sensory loss.   Labs on Admission:  Basic Metabolic Panel:  Recent Labs Lab 01/10/16 0941 01/11/16 0719 01/12/16 0636 01/13/16 0539  NA 127* 129* 128* 128*  K 4.2 4.2 4.3 3.9  CL 88* 91* 93* 91*  CO2 33* 32 27 30  GLUCOSE 110* 106* 101* 103*  BUN CREATININE 0.78 0.65 0.67 0.76  CALCIUM 8.6* 8.4* 8.1* 8.2*   Liver Function Tests:  Recent Labs Lab 01/10/16 0941  AST 25  ALT 17  ALKPHOS 64  BILITOT 1.1  PROT 6.7  ALBUMIN 3.8   CBC:  Recent Labs Lab 01/10/16 0947 01/11/16 0719 01/12/16 0636 01/13/16 0539  WBC 7.0 4.9 6.0 7.8  HGB 12.3* 12.4* 11.6* 11.7*  HCT 36.8* 37.4* 35.2* 34.2*  MCV 96.8 97.1 96.2 95.8  PLT 204 195 175 183    CBG:  Recent Labs Lab 01/10/16 0911  GLUCAP 96   Assessment/Plan Present on Admission:  . Fall . Atrial fibrillation, persistent (HCC) . Cerebrovascular disease, unspecified . Hyponatremia . Essential tremor . Memory deficits  PLAN: Hyponatremia: Continue with IVF. Follow Na.  Will increase IVF and give Lasix to get rid of some free water.  D/C SSRI.  Dementia: Will continue with SNF per Guardian.  Essential tremor: Stable.  Afib: On anticoagulation per pharmacy.   Other plans as per orders. Code Status: DNR   Houston Siren, MD.  FACP [facility] Pager [phone] 7pm to 7am.  01/13/2016, 3:49 PM

## 2016-01-13 NOTE — Clinical Social Work Note (Signed)
Clinical Social Work Assessment  Patient Details  Name: [name] MRN: [id number] Date of Birth: 06-Dec-1929  Date of referral:  01/13/16               Reason for consult:  Facility Placement                Permission sought to share information with:    Permission granted to share information::     Name::        Agency::     Relationship::     Contact Information:     Housing/Transportation Living arrangements for the past 2 months:  Single Family Home Source of Information:  Other (Comment Required) (Patient's nephew and POA, [name]) Patient Interpreter Needed:  None Criminal Activity/Legal Involvement Pertinent to Current Situation/Hospitalization:  No - Comment as needed Significant Relationships:  Other Family Members Lives with:  Spouse Do you feel safe going back to the place where you live?  Yes Need for family participation in patient care:  Yes (Comment)  Care giving concerns:  Patient requires a lot of care that is currently provided by his wife.     Social Worker assessment / plan:  Patient's nephew and POA, [name] [phone], advised that patient resides with his wife. He stated that patient's wife if patient's caretaker.  Mr. Alona Bene indicated that both patient and wife have a dementia diagnosis.  He stated that patient a family friend, [name], comes daily and checks on patient and wife.  He stated that patient's wife cannot continue to provide the needed level of care for patient.  Mr. Alona Bene stated that patient ambulates unassisted and his wife guides him by the arm. He stated that patient's wife feeds patient, dresses patient, performs ADLs for patient and provides all his care needs.  He advised that patient is incontinent.  CSW provided SNF list.  Mr. Alona Bene stated that he preferred [facility], [facility] or [facility].    Employment status:  Retired Database administrator PT Recommendations:  Not assessed at this time Information / Referral to  community resources:  Skilled Nursing Facility  Patient/Family's Response to care:  Family is agreeable for patient to go to SNF.   Patient/Family's Understanding of and Emotional Response to Diagnosis, Current Treatment, and Prognosis:  Mr. Alona Bene understands patient's diagnosis, prognosis, and treatment.   Emotional Assessment Appearance:  Developmentally appropriate Attitude/Demeanor/Rapport:    Affect (typically observed):    Orientation:  Oriented to Self, Oriented to Place, Oriented to  Time Alcohol / Substance use:  Not Applicable Psych involvement (Current and /or in the community):  No (Comment)  Discharge Needs  Concerns to be addressed:  Discharge Planning Concerns Readmission within the last 30 days:  No Current discharge risk:  None Barriers to Discharge:  No Barriers Identified   Annice Needy, LCSW 01/13/2016, 12:48 PM

## 2016-01-13 NOTE — Evaluation (Signed)
Physical Therapy Evaluation Patient Details Name: [name] MRN: [id number] DOB: 1930/10/10 Today's Date: 01/13/2016   History of Present Illness  At ER visit [name] is an 82 y.o. male with hx of dementia, essential tremor, possible Parkinson Disease, HLD, anxiety, prior CVA, afib on Coumadin, recent admission for AMS, and mental status decline, fell today at home and was brought to the hospital. His wife was at his bedside, and couldn't give any reliable information, as she has dementia as well. He denied HA, SOB, CP, Fever, chills, or any other symptomology. Work up in the ER included a Na of 127, which was 135 a month ago, and head CT showed no acute process. His Knee Xray was negative. He wasn't able to ambulate in the ER, and hospitalist was asked to admit him for further evaluation of his ability to live with his demented wife, along with Tx of hyponatremia. I spoke with his nephew (POA), and he felt that placement would be a good plan if possible. We also discussed code status, and I confirmed that he is a DNR.   Clinical Impression  Pt will respond to commands 75% of the time.  Pt rehab potential is limited by his dementia but he was ambulatory two weeks ago therefore hopefully with skilled intervention he will be able to become ambulatory again to decrease burden off of his care taker.     Follow Up Recommendations SNF    Equipment Recommendations   rolling walker     Recommendations for Other Services   none    Precautions / Restrictions Precautions Precautions: Fall Restrictions Weight Bearing Restrictions: No      Mobility  Bed Mobility Overal bed mobility: Needs Assistance Bed Mobility: Supine to Sit;Sit to Supine     Supine to sit: Min assist Sit to supine: Min assist      Transfers Overall transfer level: Needs assistance   Transfers: Sit to/from Stand Sit to Stand: Modified independent (Device/Increase time)         General  transfer comment: Pt will not attempt to ambulate keeps sitting down stating that he is tired   Ambulation/Gait                       Pertinent Vitals/Pain Pain Assessment: No/denies pain    Home Living Family/patient expects to be discharged to:: Private residence Living Arrangements: Spouse/significant other Available Help at Discharge: Family;Friend(s);Available PRN/intermittently Type of Home: House       Home Layout: One level        Prior Function   Pt would walk with hand held assistance from wife.                  Extremity/Trunk Assessment               Lower Extremity Assessment: RLE deficits/detail;LLE deficits/detail RLE Deficits / Details: 3-/5 for hip mm ; 4- /5 for knee  LLE Deficits / Details: 3-/5 for hip mm ; 4- /5 for knee      Communication    disoriented but able to answer direct questions   Cognition Arousal/Alertness: Awake/alert Behavior During Therapy: WFL for tasks assessed/performed Overall Cognitive Status: History of cognitive impairments - at baseline                               Assessment/Plan    PT Assessment Patient needs continued PT services  PT Diagnosis Difficulty walking;Generalized weakness   PT Problem List Decreased strength;Decreased balance;Decreased activity tolerance;Decreased mobility  PT Treatment Interventions Gait training;Functional mobility training;Therapeutic activities;Therapeutic exercise;Balance training   PT Goals (Current goals can be found in the Care Plan section) Acute Rehab PT Goals PT Goal Formulation: Patient unable to participate in goal setting Time For Goal Achievement: 01/17/16 Potential to Achieve Goals: Fair    Frequency Min 3X/week   Barriers to discharge    caregiver will not be able to give proper care at this point        End of Session Equipment Utilized During Treatment: Gait belt Activity Tolerance: Treatment limited secondary to medical  complications (Comment) Patient left: in bed;with call bell/phone within reach;with bed alarm set           Time: 1555-1620 PT Time Calculation (min) (ACUTE ONLY): 25 min   Charges:   PT Evaluation $PT Eval Low Complexity: 1 Procedure     PT G CodesVirgina Organ, PT CLT [phone] 01/13/2016, 4:22 PM

## 2016-01-14 ENCOUNTER — Other Ambulatory Visit: Payer: Self-pay

## 2016-01-14 DIAGNOSIS — Z79899 Other long term (current) drug therapy: Secondary | ICD-10-CM | POA: Diagnosis not present

## 2016-01-14 DIAGNOSIS — R279 Unspecified lack of coordination: Secondary | ICD-10-CM | POA: Diagnosis not present

## 2016-01-14 DIAGNOSIS — N400 Enlarged prostate without lower urinary tract symptoms: Secondary | ICD-10-CM | POA: Diagnosis not present

## 2016-01-14 DIAGNOSIS — K225 Diverticulum of esophagus, acquired: Secondary | ICD-10-CM | POA: Diagnosis not present

## 2016-01-14 DIAGNOSIS — E785 Hyperlipidemia, unspecified: Secondary | ICD-10-CM | POA: Diagnosis not present

## 2016-01-14 DIAGNOSIS — G250 Essential tremor: Secondary | ICD-10-CM | POA: Diagnosis not present

## 2016-01-14 DIAGNOSIS — I480 Paroxysmal atrial fibrillation: Secondary | ICD-10-CM | POA: Diagnosis not present

## 2016-01-14 DIAGNOSIS — I679 Cerebrovascular disease, unspecified: Secondary | ICD-10-CM | POA: Diagnosis not present

## 2016-01-14 DIAGNOSIS — I481 Persistent atrial fibrillation: Secondary | ICD-10-CM | POA: Diagnosis not present

## 2016-01-14 DIAGNOSIS — M6281 Muscle weakness (generalized): Secondary | ICD-10-CM | POA: Diagnosis not present

## 2016-01-14 DIAGNOSIS — R413 Other amnesia: Secondary | ICD-10-CM | POA: Diagnosis not present

## 2016-01-14 DIAGNOSIS — Z7401 Bed confinement status: Secondary | ICD-10-CM | POA: Diagnosis not present

## 2016-01-14 DIAGNOSIS — Z7901 Long term (current) use of anticoagulants: Secondary | ICD-10-CM | POA: Diagnosis not present

## 2016-01-14 DIAGNOSIS — E876 Hypokalemia: Secondary | ICD-10-CM | POA: Diagnosis not present

## 2016-01-14 DIAGNOSIS — G20 Parkinson's disease: Secondary | ICD-10-CM | POA: Diagnosis not present

## 2016-01-14 DIAGNOSIS — Z9181 History of falling: Secondary | ICD-10-CM | POA: Diagnosis not present

## 2016-01-14 DIAGNOSIS — E119 Type 2 diabetes mellitus without complications: Secondary | ICD-10-CM | POA: Diagnosis not present

## 2016-01-14 DIAGNOSIS — R2681 Unsteadiness on feet: Secondary | ICD-10-CM | POA: Diagnosis not present

## 2016-01-14 DIAGNOSIS — R1314 Dysphagia, pharyngoesophageal phase: Secondary | ICD-10-CM | POA: Diagnosis not present

## 2016-01-14 DIAGNOSIS — K5150 Left sided colitis without complications: Secondary | ICD-10-CM | POA: Diagnosis not present

## 2016-01-14 DIAGNOSIS — I2510 Atherosclerotic heart disease of native coronary artery without angina pectoris: Secondary | ICD-10-CM | POA: Diagnosis not present

## 2016-01-14 DIAGNOSIS — I4891 Unspecified atrial fibrillation: Secondary | ICD-10-CM

## 2016-01-14 DIAGNOSIS — R3915 Urgency of urination: Secondary | ICD-10-CM | POA: Diagnosis not present

## 2016-01-14 DIAGNOSIS — R1311 Dysphagia, oral phase: Secondary | ICD-10-CM | POA: Diagnosis not present

## 2016-01-14 DIAGNOSIS — Z8673 Personal history of transient ischemic attack (TIA), and cerebral infarction without residual deficits: Secondary | ICD-10-CM | POA: Diagnosis not present

## 2016-01-14 DIAGNOSIS — K219 Gastro-esophageal reflux disease without esophagitis: Secondary | ICD-10-CM | POA: Diagnosis not present

## 2016-01-14 DIAGNOSIS — N401 Enlarged prostate with lower urinary tract symptoms: Secondary | ICD-10-CM | POA: Diagnosis not present

## 2016-01-14 DIAGNOSIS — K449 Diaphragmatic hernia without obstruction or gangrene: Secondary | ICD-10-CM | POA: Diagnosis not present

## 2016-01-14 DIAGNOSIS — R4182 Altered mental status, unspecified: Secondary | ICD-10-CM | POA: Diagnosis not present

## 2016-01-14 DIAGNOSIS — I10 Essential (primary) hypertension: Secondary | ICD-10-CM | POA: Diagnosis not present

## 2016-01-14 DIAGNOSIS — I491 Atrial premature depolarization: Secondary | ICD-10-CM | POA: Diagnosis not present

## 2016-01-14 DIAGNOSIS — I252 Old myocardial infarction: Secondary | ICD-10-CM | POA: Diagnosis not present

## 2016-01-14 DIAGNOSIS — E871 Hypo-osmolality and hyponatremia: Secondary | ICD-10-CM | POA: Diagnosis not present

## 2016-01-14 DIAGNOSIS — R1310 Dysphagia, unspecified: Secondary | ICD-10-CM | POA: Diagnosis not present

## 2016-01-14 DIAGNOSIS — R351 Nocturia: Secondary | ICD-10-CM | POA: Diagnosis not present

## 2016-01-14 LAB — BASIC METABOLIC PANEL
ANION GAP: 8 (ref 5–15)
BUN: 13 mg/dL (ref 6–20)
CALCIUM: 7.6 mg/dL — AB (ref 8.9–10.3)
CO2: 28 mmol/L (ref 22–32)
CREATININE: 0.83 mg/dL (ref 0.61–1.24)
Chloride: 94 mmol/L — ABNORMAL LOW (ref 101–111)
GFR calc Af Amer: >60 mL/min (ref >60)
GLUCOSE: 85 mg/dL (ref 65–99)
Potassium: 3.5 mmol/L (ref 3.5–5.1)
Sodium: 130 mmol/L — ABNORMAL LOW (ref 135–145)

## 2016-01-14 LAB — PROTIME-INR
INR: 2.27 — ABNORMAL HIGH (ref 0.00–1.49)
Prothrombin Time: 24.9 seconds — ABNORMAL HIGH (ref 11.6–15.2)

## 2016-01-14 MED ORDER — WARFARIN SODIUM 5 MG PO TABS
2.5000 mg | ORAL_TABLET | Freq: Once | ORAL | Status: DC
  Filled 2016-01-14: qty 1

## 2016-01-14 NOTE — Clinical Social Work Note (Signed)
CSW spoke with Frances[facility] Runner and provided bed offers. Mr. Alona Bene stated that he had done some research and chose [facility] for patient's discharge facility.   Annice Needy, [place] [phone]

## 2016-01-14 NOTE — Progress Notes (Signed)
ANTICOAGULATION CONSULT NOTE -  Pharmacy Consult for Coumadin Indication: atrial fibrillation  Allergies  Allergen Reactions  . Asa-Apap-Salicyl-Caff     Takes warfarin  . Codeine Other (See Comments)    Unknown reaction  . Nsaids Other (See Comments)    On warfarin  . Sulfonamide Derivatives Nausea And Vomiting    Patient Measurements: Height:  (175.3 cm) Weight: 150 lb (68.04 kg) IBW/kg (Calculated) : 70.7  Vital Signs: Temp: 97.3 F (36.3 C) (02/21 0521) Temp Source: Axillary (02/21 0521) BP: 94/69 mmHg (02/21 0521) Pulse Rate: 100 (02/21 0521)  Labs:  Recent Labs  01/12/16 0636 01/13/16 0539 01/14/16 0528  HGB 11.6* 11.7*  --   HCT 35.2* 34.2*  --   PLT 175 183  --   LABPROT 23.5* 23.3* 24.9*  INR 2.11* 2.08* 2.27*  CREATININE 0.67 0.76 0.83    Estimated Creatinine Clearance: 64.9 mL/min (by C-G formula based on Cr of 0.83).   Medical History: Past Medical History  Diagnosis Date  . Dyslipidemia   . Anxiety   . GIB (gastrointestinal bleeding)   . Obesity   . UC (ulcerative colitis confined to rectum) (HCC)     Left-sided  . BPH (benign prostatic hyperplasia)   . Diverticulosis   . GERD (gastroesophageal reflux disease)   . Hemorrhoids   . Diabetes mellitus   . Tremor   . CAD (coronary artery disease)     Anterior MI and DES stenting 2004, Vfib arrest; left main 25% stenosis, LAD 80% stenosis, 99% stenosis, 25% circumflex stenosis, 25% ramus intermedius stenosis, 50-40% right coronary artery stenosis, 90% RV branch stenosis with right to left collaterals; EF 55% with mild naterior hypokinesis (underwent Taxus stenting at the time) . Stress perfusion study January 2012 EF 48% with old apical infarct  . Hyperlipidemia   . Hyperplastic colon polyp   . Myocardial infarction (HCC)   . Hypertension   . Memory deficits 05/05/2013  . Essential and other specified forms of tremor 05/05/2013  . Hearing deficit     Wears hearing aids  . Cataract   .  Atrial fibrillation, persistent (HCC)   . Tremor, essential 12/11/2015  . Stroke [facility])     Medications:  Prescriptions prior to admission  Medication Sig Dispense Refill Last Dose  . atorvastatin (LIPITOR) 40 MG tablet TAKE ONE TABLET AT BEDTIME 30 tablet 4 01/09/2016 at Unknown time  . Cholecalciferol (VITAMIN D) 1000 UNITS capsule Take 1 capsule (1,000 Units total) by mouth daily.   01/09/2016 at Unknown time  . digoxin (LANOXIN) 0.125 MG tablet TAKE 1 TABLET IN THE MORNING 30 tablet 4 01/09/2016 at Unknown time  . metoprolol (LOPRESSOR) 100 MG tablet TAKE 1 & 1/2 TABLETS TWICE A DAY 135 tablet 1 01/09/2016 at Unknown time  . mirabegron ER (MYRBETRIQ) 25 MG TB24 tablet Take 1 tablet (25 mg total) by mouth daily. 28 tablet 0 01/09/2016 at Unknown time  . Multiple Vitamin (MULTIVITAMIN WITH MINERALS) TABS tablet Take 1 tablet by mouth daily. 30 tablet 11 01/09/2016 at Unknown time  . PARoxetine (PAXIL) 30 MG tablet TAKE 1 TABLET IN THE MORNING 30 tablet 0 01/09/2016 at Unknown time  . sulfaSALAzine (AZULFIDINE) 500 MG EC tablet Take 1 tablet (500 mg total) by mouth 2 (two) times daily. Take 500 mg twice a day by mouth for 1 week, then 1000 mg BID (Patient taking differently: Take 500 mg by mouth every morning. ) 60 tablet 0 01/09/2016 at Unknown time  . vitamin B-12 (  CYANOCOBALAMIN) 1000 MCG tablet TAKE 1 TABLET IN THE MORNING 30 tablet 4 01/09/2016 at Unknown time  . warfarin (COUMADIN) 5 MG tablet Takes as directed (Patient taking differently: take one tablet on Tuesdays and Thursdays, then take one-half tablet on all other days) 25 tablet 2 01/09/2016 at Unknown time  . hydrOXYzine (ATARAX/VISTARIL) 10 MG tablet Take one-half tablet daily as needed (Patient not taking: Reported on 01/10/2016) 15 tablet 0 Not Taking at Unknown time  . nitroGLYCERIN (NITROSTAT) 0.4 MG SL tablet Place 1 tablet (0.4 mg total) under the tongue every 5 (five) minutes as needed for chest pain. 25 tablet 1 Taking     Assessment: Patient discharged from [facility] 12/03/2015.  Present Med Rec Coumadin dosing same as anticoagulation dose as listed below. INR therapeutic today at 2.27  Anticoagulation Dose Instructions as of 10/29/2015       Total Sun Mon Tue Wed Thu Fri Sat    New Dose 22.5 mg 2.5 mg 2.5 mg 5 mg 2.5 mg 5 mg 2.5 mg 2.5 mg      (5 mg x 0.5)  (5 mg x 0.5)  (5 mg x 1)  (5 mg x 0.5)  (5 mg x 1)  (5 mg x 0.5)  (5 mg x 0.5)                  Goal of Therapy:  INR 2-3 Monitor platelets by anticoagulation protocol: Yes   Plan:  Coumadin 2.5 mg po x 1 dose today INR/PT daily Monitor for S/S bleeding.  Thanks for allowing pharmacy to be a part of this patient's care.  Talbert Cage, PharmD Clinical Pharmacist  01/14/2016,8:32 AM

## 2016-01-14 NOTE — Progress Notes (Signed)
Patient discharged to [facility], discharge packet sent by [facility]. Patient transported via EMS no c/o pain or shortness of breath at d/c.  Marlyss Cissell [employer]

## 2016-01-14 NOTE — Discharge Summary (Signed)
Physician Discharge Summary  SELIG WAMPOLE [name]:[id number] DOB: 02-Aug-1930 DOA: 01/10/2016  PCP: Rudi Heap, MD  Admit date: 01/10/2016 Discharge date: 01/14/2016  Time spent: 35 minutes  Recommendations for Outpatient Follow-up:  1. Follow up with SNF physician next week.    Discharge Diagnoses:  Principal Problem:   Fall Active Problems:   Atrial fibrillation, persistent (HCC)   Cerebrovascular disease, unspecified   Memory deficits   Essential tremor   Type 2 diabetes, diet controlled (HCC)   Hyponatremia   Discharge Condition: no improvement.   Na improved.   Diet recommendation: carb modified diet.   Filed Weights   01/10/16 0905  Weight: 68.04 kg (150 lb)    History of present illness: patient was admitted by me on [name] 10 2016.  As per my prior H and P:  " [name] is an 82 y.o. male with hx of dementia, essential tremor, possible Parkinson Disease, HLD, anxiety, prior CVA, afib on Coumadin, recent admission for AMS, and mental status decline, fell today at home and was brought to the hospital. His wife was at his bedside, and couldn't give any reliable information, as she has dementia as well. He denied HA, SOB, CP, Fever, chills, or any other symptomology. Work up in the ER included a Na of 127, which was 135 a month ago, and head CT showed no acute process. His Knee Xray was negative. He wasn't able to ambulate in the ER, and hospitalist was asked to admit him for further evaluation of his ability to live with his demented wife, along with Tx of hyponatremia. I spoke with his nephew (POA), and he felt that placement would be a good plan if possible. We also discussed code status, and I confirmed that he is a DNR.    Hospital Course: Patient was admitted and he was given IVF.  His Na came up slowly, and it was felt that his hyponatremia was not acute.  His Na rose to 130.  His SSRI was discontinued, as it can cause hyponatremia.  His fall did not cause any  injury.  As per plan discussed on admission, he was to be discharged into SNF, as he was not felt to be safe at home.  His hospital stayed was uneventful.  He was still confused due to his dementia.  Bed was available today, and he is stable for discharge.  He will remain a DNR Code status.   Thank you and Good Day.    Discharge Exam: Filed Vitals:   01/13/16 2212 01/14/16 0521  BP: 120/45 94/69  Pulse: 118 100  Temp: 99.7 F (37.6 C) 97.3 F (36.3 C)  Resp: 20 20   Discharge Instructions   Discharge Instructions    Diet - low sodium heart healthy    Complete by:  As directed      Increase activity slowly    Complete by:  As directed           Current Discharge Medication List    CONTINUE these medications which have NOT CHANGED   Details  atorvastatin (LIPITOR) 40 MG tablet TAKE ONE TABLET AT BEDTIME Qty: 30 tablet, Refills: 4    Cholecalciferol (VITAMIN D) 1000 UNITS capsule Take 1 capsule (1,000 Units total) by mouth daily.    digoxin (LANOXIN) 0.125 MG tablet TAKE 1 TABLET IN THE MORNING Qty: 30 tablet, Refills: 4    metoprolol (LOPRESSOR) 100 MG tablet TAKE 1 & 1/2 TABLETS TWICE A DAY Qty: 135 tablet, Refills:  1    mirabegron ER (MYRBETRIQ) 25 MG TB24 tablet Take 1 tablet (25 mg total) by mouth daily. Qty: 28 tablet, Refills: 0    Multiple Vitamin (MULTIVITAMIN WITH MINERALS) TABS tablet Take 1 tablet by mouth daily. Qty: 30 tablet, Refills: 11    sulfaSALAzine (AZULFIDINE) 500 MG EC tablet Take 1 tablet (500 mg total) by mouth 2 (two) times daily. Take 500 mg twice a day by mouth for 1 week, then 1000 mg BID Qty: 60 tablet, Refills: 0    vitamin B-12 (CYANOCOBALAMIN) 1000 MCG tablet TAKE 1 TABLET IN THE MORNING Qty: 30 tablet, Refills: 4    warfarin (COUMADIN) 5 MG tablet Takes as directed Qty: 25 tablet, Refills: 2    hydrOXYzine (ATARAX/VISTARIL) 10 MG tablet Take one-half tablet daily as needed Qty: 15 tablet, Refills: 0    nitroGLYCERIN (NITROSTAT)  0.4 MG SL tablet Place 1 tablet (0.4 mg total) under the tongue every 5 (five) minutes as needed for chest pain. Qty: 25 tablet, Refills: 1      STOP taking these medications     PARoxetine (PAXIL) 30 MG tablet        Allergies  Allergen Reactions  . Asa-Apap-Salicyl-Caff     Takes warfarin  . Codeine Other (See Comments)    Unknown reaction  . Nsaids Other (See Comments)    On warfarin  . Sulfonamide Derivatives Nausea And Vomiting   Follow-up Information    Follow up with [facility] .   Specialty:  Skilled Nursing Facility   Contact information:   [address] [place] [address] [phone]       The results of significant diagnostics from this hospitalization (including imaging, microbiology, ancillary and laboratory) are listed below for reference.    Significant Diagnostic Studies: Ct Head Wo Contrast  01/10/2016  CLINICAL DATA:  Status post fall this morning. EXAM: CT HEAD WITHOUT CONTRAST TECHNIQUE: Contiguous axial images were obtained from the base of the skull through the vertex without intravenous contrast. COMPARISON:  11/30/2015 FINDINGS: There is no evidence of mass effect, midline shift, or extra-axial fluid collections. There is no evidence of a space-occupying lesion or intracranial hemorrhage. There is no evidence of a cortical-based area of acute infarction. There is generalized cerebral atrophy. There is periventricular white matter low attenuation likely secondary to microangiopathy. The ventricles and sulci are appropriate for the patient's age. The basal cisterns are patent. Visualized portions of the orbits are unremarkable. The visualized portions of the paranasal sinuses and mastoid air cells are unremarkable. Cerebrovascular atherosclerotic calcifications are noted. The osseous structures are unremarkable. IMPRESSION: 1. No acute intracranial pathology. 2. Chronic microvascular disease and cerebral atrophy.  Electronically Signed   By: Elige Ko   On: 01/10/2016 11:37   Dg Knee Complete 4 Views Left  01/10/2016  CLINICAL DATA:  Larey Seat this morning with left knee pain. EXAM: LEFT KNEE - COMPLETE 4+ VIEW COMPARISON:  None. FINDINGS: Mild medial joint space narrowing. The knee is located without definite fracture. There is a small cortical step-off involving the proximal fibula which is probably chronic. No significant joint effusion. There are vascular calcifications. IMPRESSION: Mild osteoarthritic changes without acute bone abnormality. Mild cortical irregularity involving the proximal fibula is probably chronic. Recommend clinical correlation in this area. Electronically Signed   By: Richarda Overlie M.D.   On: 01/10/2016 10:32    Microbiology: Recent Results (from the past 240 hour(s))  Urine culture     Status: None  Collection Time: 01/10/16  9:30 AM  Result Value Ref Range Status   Specimen Description URINE, CLEAN CATCH  Final   Special Requests NONE  Final   Culture   Final    MULTIPLE SPECIES PRESENT, SUGGEST RECOLLECTION Performed at [facility]    Report Status 01/12/2016 FINAL  Final     Labs: Basic Metabolic Panel:  Recent Labs Lab 01/10/16 0941 01/11/16 0719 01/12/16 0636 01/13/16 0539 01/14/16 0528  NA 127* 129* 128* 128* 130*  K 4.2 4.2 4.3 3.9 3.5  CL 88* 91* 93* 91* 94*  CO2 33* 32 27 30 28   GLUCOSE 110* 106* 101* 103* 85  BUN 9 8 8 9 13   CREATININE 0.78 0.65 0.67 0.76 0.83  CALCIUM 8.6* 8.4* 8.1* 8.2* 7.6*   Liver Function Tests:  Recent Labs Lab 01/10/16 0941  AST 25  ALT 17  ALKPHOS 64  BILITOT 1.1  PROT 6.7  ALBUMIN 3.8   CBC:  Recent Labs Lab 01/10/16 0947 01/11/16 0719 01/12/16 0636 01/13/16 0539  WBC 7.0 4.9 6.0 7.8  HGB 12.3* 12.4* 11.6* 11.7*  HCT 36.8* 37.4* 35.2* 34.2*  MCV 96.8 97.1 96.2 95.8  PLT 204 195 175 183    CBG:  Recent Labs Lab 01/10/16 0911  GLUCAP 96    Signed:  Dequavious Harshberger MD.  [facility] 01/14/2016, 1:48 PM

## 2016-01-14 NOTE — Progress Notes (Signed)
Physical Therapy Treatment Patient Details Name: ARMEL RABBANI MRN: [id number] DOB: [birth year] Today's Date: 01/14/2016    History of Present Illness At ER visit JATORIAN RENAULT is an 82 y.o. male with hx of dementia, essential tremor, possible Parkinson Disease, HLD, anxiety, prior CVA, afib on Coumadin, recent admission for AMS, and mental status decline, fell today at home and was brought to the hospital. His wife was at his bedside, and couldn't give any reliable information, as she has dementia as well. He denied HA, SOB, CP, Fever, chills, or any other symptomology. Work up in the ER included a Na of 127, which was 135 a month ago, and head CT showed no acute process. His Knee Xray was negative. He wasn't able to ambulate in the ER, and hospitalist was asked to admit him for further evaluation of his ability to live with his demented wife, along with Tx of hyponatremia. I spoke with his nephew (POA), and he felt that placement would be a good plan if possible. We also discussed code status, and I confirmed that he is a DNR.     PT Comments    Pt was found with wet linens and gown, therapist notified nurse tech and RN of situation. Pt willing to pariticpate with therapist. He continues to take increased time and encouragement performing bed mobility with limited assistance required. He is able to stand with CGA to MinA with therapist instruction on use of RW and to maintain upright posture. He stood for ~65min with CGA and did not want to attempt walking this session secondary to needing to use the bathroom. Once pt sat down, he was confused how he should use the bathroom. Pt is making progress towards his goals while being able to follow simple commands. He would benefit from continued PT services at [facility] to improve his functional mobility and strength.   Follow Up Recommendations  SNF     Equipment Recommendations       Recommendations for Other Services       Precautions /  Restrictions      Mobility  Bed Mobility Overal bed mobility: Needs Assistance Bed Mobility: Supine to Sit;Sit to Supine     Supine to sit: Min assist;HOB elevated Sit to supine: Min assist      Transfers Overall transfer level: Needs assistance     Sit to Stand: Min guard         General transfer comment: Pt unwilling to attempt to ambulate; therapist asked pt to walk and he sat down stating he was tired  Ambulation/Gait                 Stairs            Wheelchair Mobility    Modified Rankin (Stroke Patients Only)       Balance Overall balance assessment: Needs assistance Sitting-balance support: Bilateral upper extremity supported Sitting balance-Leahy Scale: Fair     Standing balance support: Bilateral upper extremity supported Standing balance-Leahy Scale: Poor                      Cognition Arousal/Alertness: Awake/alert Behavior During Therapy: WFL for tasks assessed/performed Overall Cognitive Status: No family/caregiver present to determine baseline cognitive functioning                      Exercises General Exercises - Lower Extremity Ankle Circles/Pumps: AROM;Both;10 reps Long Arc Quad: AROM;Both;10 reps    General Comments  Pertinent Vitals/Pain Pain Assessment: No/denies pain    Home Living                      Prior Function            PT Goals (current goals can now be found in the care plan section) Acute Rehab PT Goals PT Goal Formulation: Patient unable to participate in goal setting Time For Goal Achievement: 01/17/16 Potential to Achieve Goals: Fair    Frequency  Min 3X/week    PT Plan Current plan remains appropriate    Co-evaluation             End of Session Equipment Utilized During Treatment: Gait belt Activity Tolerance: Treatment limited secondary to medical complications (Comment) Patient left: in bed;with call bell/phone within reach;with bed alarm set      Time: 1342-1402 PT Time Calculation (min) (ACUTE ONLY): 20 min  Charges:  $Therapeutic Activity: 8-22 mins                    G Codes:        2:28 PM,01/25/16 Marylyn Ishihara PT, DPT [facility] Outpatient Physical Therapy [phone]

## 2016-01-14 NOTE — Care Management Note (Signed)
Case Management Note  Patient Details  Name: ARHUM PEEPLES MRN: [id number] Date of Birth: November 08, 1930  Subjective/Objective:                  Pt admitted after frequent falls and hyponatremia. Pt is from home, PT has recommended SNF at [place]. Pt/family is agreeable. CSW is aware of DC plan and will work with pt/family to find placement. Pt will DC today.    Action/Plan: No CM needs.   Expected Discharge Date:                  Expected Discharge Plan:  Skilled Nursing Facility  In-House Referral:  Clinical Social Work  Discharge planning Services  CM Consult  Post Acute Care Choice:  NA Choice offered to:  NA  DME Arranged:    DME Agency:     HH Arranged:    HH Agency:     Status of Service:  Completed, signed off  Medicare Important Message Given:  Yes Date Medicare IM Given:    Medicare IM give by:    Date Additional Medicare IM Given:    Additional Medicare Important Message give by:     If discussed at Long Length of Stay Meetings, dates discussed:    Additional Comments:  Malcolm Metro, RN 01/14/2016, 12:57 PM

## 2016-01-14 NOTE — Clinical Social Work Placement (Signed)
   CLINICAL SOCIAL WORK PLACEMENT  NOTE  Date:  01/14/2016  Patient Details  Name: [name] MRN: [id number] Date of Birth: Jun 06, 1930  Clinical Social Work is seeking post-discharge placement for this patient at the Skilled  Nursing Facility level of care (*CSW will initial, date and re-position this form in  chart as items are completed):  Yes   Patient/family provided with [place] Clinical Social Work Department's list of facilities offering this level of care within the geographic area requested by the patient (or if unable, by the patient's family).  Yes   Patient/family informed of their freedom to choose among providers that offer the needed level of care, that participate in Medicare, Medicaid or managed care program needed by the patient, have an available bed and are willing to accept the patient.  Yes   Patient/family informed of [employer]'s ownership interest in [facility] and [facility], as well as of the fact that they are under no obligation to receive care at these facilities.  PASRR submitted to EDS on 01/13/16     PASRR number received on 01/13/16     Existing PASRR number confirmed on       FL2 transmitted to all facilities in geographic area requested by pt/family on       FL2 transmitted to all facilities within larger geographic area on       Patient informed that his/her managed care company has contracts with or will negotiate with certain facilities, including the following:        Yes   Patient/family informed of bed offers received.  Patient chooses bed at [facility]     Physician recommends and patient chooses bed at      Patient to be transferred to   on  .  Patient to be transferred to facility by       Patient family notified on   of transfer.  Name of family member notified:        PHYSICIAN       Additional Comment:    _______________________________________________ Annice Needy, LCSW 01/14/2016,  11:17 AM

## 2016-01-14 NOTE — Clinical Social Work Note (Signed)
CSW facilitated Discharged.  CSW notified patient's nephew and POA, Francesco Runner, that patient was being discharged and would be transported to [facility] via [facility].  CSW notified [name] at [facility] that patient was being discharged and would be transported to the facility.  CSW sent clinicals via Epic hub.   CSW arranged transportation via [facility].  CSW signing off.   Shawntez Dickison, Juleen China, LCSW

## 2016-01-14 NOTE — Care Management Important Message (Signed)
Important Message  Patient Details  Name: [name] MRN: [id number] Date of Birth: [birth year]   Medicare Important Message Given:  Yes    Malcolm Metro, RN 01/14/2016, 12:57 PM

## 2016-01-15 DIAGNOSIS — I252 Old myocardial infarction: Secondary | ICD-10-CM | POA: Diagnosis not present

## 2016-01-15 DIAGNOSIS — I480 Paroxysmal atrial fibrillation: Secondary | ICD-10-CM | POA: Diagnosis not present

## 2016-01-15 DIAGNOSIS — Z9181 History of falling: Secondary | ICD-10-CM | POA: Diagnosis not present

## 2016-01-15 DIAGNOSIS — G20 Parkinson's disease: Secondary | ICD-10-CM | POA: Diagnosis not present

## 2016-01-15 DIAGNOSIS — E871 Hypo-osmolality and hyponatremia: Secondary | ICD-10-CM | POA: Diagnosis not present

## 2016-01-16 ENCOUNTER — Other Ambulatory Visit: Payer: Self-pay | Admitting: Family Medicine

## 2016-01-16 ENCOUNTER — Ambulatory Visit: Payer: Medicare Other | Admitting: Family Medicine

## 2016-01-16 MED ORDER — MIRABEGRON ER 25 MG PO TB24: 25.0000 mg | ORAL_TABLET | Freq: Every day | ORAL | Status: DC

## 2016-01-21 ENCOUNTER — Ambulatory Visit: Payer: Medicare Other | Admitting: Family Medicine

## 2016-01-23 DIAGNOSIS — I480 Paroxysmal atrial fibrillation: Secondary | ICD-10-CM | POA: Diagnosis not present

## 2016-01-23 DIAGNOSIS — G20 Parkinson's disease: Secondary | ICD-10-CM | POA: Diagnosis not present

## 2016-01-27 DIAGNOSIS — I480 Paroxysmal atrial fibrillation: Secondary | ICD-10-CM | POA: Diagnosis not present

## 2016-01-27 DIAGNOSIS — G20 Parkinson's disease: Secondary | ICD-10-CM | POA: Diagnosis not present

## 2016-01-30 DIAGNOSIS — G20 Parkinson's disease: Secondary | ICD-10-CM | POA: Diagnosis not present

## 2016-01-30 DIAGNOSIS — I480 Paroxysmal atrial fibrillation: Secondary | ICD-10-CM | POA: Diagnosis not present

## 2016-02-10 DIAGNOSIS — I480 Paroxysmal atrial fibrillation: Secondary | ICD-10-CM | POA: Diagnosis not present

## 2016-02-10 DIAGNOSIS — Z8673 Personal history of transient ischemic attack (TIA), and cerebral infarction without residual deficits: Secondary | ICD-10-CM | POA: Diagnosis not present

## 2016-02-12 ENCOUNTER — Other Ambulatory Visit (HOSPITAL_COMMUNITY): Payer: Self-pay | Admitting: Hospice and Palliative Medicine

## 2016-02-12 DIAGNOSIS — R1310 Dysphagia, unspecified: Secondary | ICD-10-CM

## 2016-02-17 ENCOUNTER — Encounter (HOSPITAL_COMMUNITY): Payer: Self-pay | Admitting: Speech Pathology

## 2016-02-17 ENCOUNTER — Ambulatory Visit (HOSPITAL_COMMUNITY)
Admission: RE | Admit: 2016-02-17 | Discharge: 2016-02-17 | Disposition: A | Discharge status: Home / Self Care | Payer: Medicare Other | Source: Ambulatory Visit | Attending: Hospice and Palliative Medicine | Admitting: Hospice and Palliative Medicine

## 2016-02-17 ENCOUNTER — Ambulatory Visit (HOSPITAL_COMMUNITY): Payer: Medicare Other | Attending: Hospice and Palliative Medicine | Admitting: Speech Pathology

## 2016-02-17 DIAGNOSIS — Z79899 Other long term (current) drug therapy: Secondary | ICD-10-CM | POA: Insufficient documentation

## 2016-02-17 DIAGNOSIS — E785 Hyperlipidemia, unspecified: Secondary | ICD-10-CM | POA: Insufficient documentation

## 2016-02-17 DIAGNOSIS — R1310 Dysphagia, unspecified: Secondary | ICD-10-CM | POA: Diagnosis not present

## 2016-02-17 DIAGNOSIS — E119 Type 2 diabetes mellitus without complications: Secondary | ICD-10-CM | POA: Insufficient documentation

## 2016-02-17 DIAGNOSIS — K225 Diverticulum of esophagus, acquired: Secondary | ICD-10-CM | POA: Diagnosis not present

## 2016-02-17 DIAGNOSIS — I481 Persistent atrial fibrillation: Secondary | ICD-10-CM | POA: Diagnosis not present

## 2016-02-17 DIAGNOSIS — R1311 Dysphagia, oral phase: Secondary | ICD-10-CM | POA: Diagnosis not present

## 2016-02-17 DIAGNOSIS — K219 Gastro-esophageal reflux disease without esophagitis: Secondary | ICD-10-CM | POA: Insufficient documentation

## 2016-02-17 DIAGNOSIS — I2510 Atherosclerotic heart disease of native coronary artery without angina pectoris: Secondary | ICD-10-CM | POA: Insufficient documentation

## 2016-02-17 DIAGNOSIS — K449 Diaphragmatic hernia without obstruction or gangrene: Secondary | ICD-10-CM | POA: Diagnosis not present

## 2016-02-17 DIAGNOSIS — R1314 Dysphagia, pharyngoesophageal phase: Secondary | ICD-10-CM | POA: Insufficient documentation

## 2016-02-17 NOTE — Therapy (Signed)
[facility] [address] [place], [place], [address] Phone: [phone]   Fax:  [phone]  Modified Barium Swallow  Patient Details  Name: VICKI PASQUAL MRN: [id number] Date of Birth: 14-Apr-1930 No Data Recorded  Encounter Date: 02/17/2016      End of Session - 02/17/16 2247    Visit Number 1   Number of Visits 1   Authorization Type [employer] Medicare   SLP Start Time 1330   SLP Stop Time  1355   SLP Time Calculation (min) 25 min   Activity Tolerance Patient tolerated treatment well      Past Medical History  Diagnosis Date  . Dyslipidemia   . Anxiety   . GIB (gastrointestinal bleeding)   . Obesity   . UC (ulcerative colitis confined to rectum) (HCC)     Left-sided  . BPH (benign prostatic hyperplasia)   . Diverticulosis   . GERD (gastroesophageal reflux disease)   . Hemorrhoids   . Diabetes mellitus   . Tremor   . CAD (coronary artery disease)     Anterior MI and DES stenting 2004, Vfib arrest; left main 25% stenosis, LAD 80% stenosis, 99% stenosis, 25% circumflex stenosis, 25% ramus intermedius stenosis, 50-40% right coronary artery stenosis, 90% RV branch stenosis with right to left collaterals; EF 55% with mild naterior hypokinesis (underwent Taxus stenting at the time) . Stress perfusion study January 2012 EF 48% with old apical infarct  . Hyperlipidemia   . Hyperplastic colon polyp   . Myocardial infarction (HCC)   . Hypertension   . Memory deficits 05/05/2013  . Essential and other specified forms of tremor 05/05/2013  . Hearing deficit     Wears hearing aids  . Cataract   . Atrial fibrillation, persistent (HCC)   . Tremor, essential 12/11/2015  . Stroke [facility])     Past Surgical History  Procedure Laterality Date  . Hammer toe surgery      right  . Inguinal hernia repair      right, bilateral  . Coronary angioplasty with stent placement      Taxus stenting  . Cataract extraction w/phaco  10/27/2012     Procedure: CATARACT EXTRACTION PHACO AND INTRAOCULAR LENS PLACEMENT (IOC);  Surgeon: Gemma Payor, MD;  Location: [facility];  Service: Ophthalmology;  Laterality: Left;  CDE 22.67    There were no vitals filed for this visit.  Visit Diagnosis: Dysphagia, pharyngoesophageal phase      Subjective Assessment - 02/17/16 2237    Subjective "I never knew this building was here."   Special Tests MBSS   Currently in Pain? No/denies             General - 02/17/16 2237    General Information   Date of Onset 02/10/16   HPI Mr. Vernon Maish is an 82 yo male who resides at [facility] and was referred for MBSS   Type of Study MBS-Modified Barium Swallow Study   Diet Prior to this Study Regular;Thin liquids   Temperature Spikes Noted No   Respiratory Status Room air   History of Recent Intubation No   Behavior/Cognition Alert;Cooperative;Pleasant mood;Confused;Requires cueing   Oral Cavity Assessment Within Functional Limits   Oral Care Completed by SLP No   Vision Impaired for self feeding   Self-Feeding Abilities Total assist   Patient Positioning Upright in chair   Baseline Vocal Quality Normal   Volitional Cough Strong   Volitional Swallow Able to elicit   Anatomy Within functional  limits   Pharyngeal Secretions Not observed secondary MBS            Oral Preparation/Oral Phase - Mar 02, 2016 2239    Oral Preparation/Oral Phase   Oral Phase Impaired   Oral - Thin   Oral - Thin Straw Oral residue   Oral - Solids   Oral - Puree Delayed A-P transit   Oral - Regular Delayed A-P transit;Oral residue;Piecemeal swallowing;Imparied mastication   Oral - Pill --  brief oral holding   Electrical stimulation - Oral Phase   Was Electrical Stimulation Used No          Pharyngeal Phase - 2016/03/02 2241    Pharyngeal Phase   Pharyngeal Phase Impaired   Pharyngeal - Thin   Pharyngeal- Thin Cup Delayed swallow initiation;Swallow initiation at vallecula;Swallow initiation at pyriform sinus    Pharyngeal- Thin Straw Delayed swallow initiation;Swallow initiation at pyriform sinus   Pharyngeal - Solids   Pharyngeal- Puree Swallow initiation at vallecula   Pharyngeal- Regular Delayed swallow initiation-vallecula   Electrical Stimulation - Pharyngeal Phase   Was Electrical Stimulation Used No          Cricopharyngeal Phase - March 02, 2016 2243    Cervical Esophageal Phase   Cervical Esophageal Phase Impaired   Cervical Esophageal Phase - Thin   Thin Cup Esophageal backflow into the pharynx   Cervical Esophageal Phase - Solids   Puree Esophageal backflow into the pharynx   Cervical Esophageal Phase - Comment   Cervical Esophageal Comment Pt with Zenker's diverticulum as confirmed by radiologist           Plan - 2016-03-02 2248    Clinical Impression Statement Mr. Popoca presents with mild oral phase dysphagia characterized by delayed oral transit and mild pharyngeal phase with mild delay in swallow trigger with liquids (triggering after filling valleculae and spilling to pyriforms at times), however no penetration, aspiration, or significant residuals noted. Pt with mild oral residuals which cleared with repeat swallow. Of note, pt had moderate sized Zenker's diverticulum (confirmed by radiologist) which did fill and cause some retro-movement of bolus back into pharynx which pt then swallowed again to clear. This did elicit some coughing and gagging across all consistencies and textures, but pt protected airway and no penetration/aspiration observed. Repeat swallows were effective in moving bolus to proximal esophagus, although suspect some retention in Zenker's (difficult to visualize due to shoulders/bone density). Consider ENT consult for Zenker's repair if of interest to pt/family. Consider crushing pills in puree (although barium tablet did pass) to avoid retention of pills in Zenker's. Baseline diet is unknown, but appears to be regular textures and thin liquids. This would be  appropriate to continue, however mech soft textures may be better given Zenker's (to avoid nuts, seeds, etc). Will defer to treating SLP to recommend clinically. Pt is at risk for aspiration given backflow into pharynx, however pt protected airway well during this study.          G-Codes - 2016-03-02 2249    Functional Assessment Tool Used Clinical judgment; MBSS   Functional Limitations Swallowing   Swallow Current Status (U9811) At least 20 percent but less than 40 percent impaired, limited or restricted   Swallow Goal Status (B1478) At least 20 percent but less than 40 percent impaired, limited or restricted   Swallow Discharge Status [phone]) At least 20 percent but less than 40 percent impaired, limited or restricted          Recommendations/Treatment - 02-Mar-2016 2244  Swallow Evaluation Recommendations   Recommended Consults Consider ENT evaluation  for Zenker's   SLP Diet Recommendations Age appropriate regular;Thin   Liquid Administration via Cup;Straw   Medication Administration Whole meds with liquid  consider crushing   Supervision Full supervision/cueing for compensatory strategies;Full assist for feeding   Compensations Slow rate;Small sips/bites;Multiple dry swallows after each bite/sip   Postural Changes Seated upright at 90 degrees;Remain upright for at least 30 minutes after feeds/meals          Prognosis - 02/17/16 2246    Prognosis   Prognosis for Safe Diet Advancement Fair   Barriers to Reach Goals Severity of deficits   Individuals Consulted   Consulted and Agree with Results and Recommendations Patient;Patient unable/family or caregiver not available   Report Sent to  Referring physician;Facility (Comment)      Problem List Patient Active Problem List   Diagnosis Date Noted  . Fall 01/10/2016  . Hyponatremia 01/10/2016  . Tremor, essential 12/11/2015  . Altered mental status 11/30/2015  . Type 2 diabetes, diet controlled (HCC) 06/28/2015  .  Cerebrovascular disease 12/05/2014  . ASCVD (arteriosclerotic cardiovascular disease) 12/05/2014  . Persistent atrial fibrillation (HCC) 12/05/2014  . High risk medication use 06/22/2013  . Hypokalemia 06/22/2013  . Memory deficits 05/05/2013  . Essential tremor 05/05/2013  . Cerebrovascular disease, unspecified 01/16/2013  . CAD (coronary artery disease)   . BPH (benign prostatic hypertrophy) 02/11/2011  . Premature atrial complexes 02/11/2011  . Panic attack 02/11/2011  . Hyperlipidemia 02/12/2010  . Atrial fibrillation, persistent (HCC) 10/30/2009  . GERD 03/12/2009  . ULCERATIVE COLITIS-LEFT SIDE 03/12/2009   Thank you,  Havery MorosDabney Lauria Depoy, CCC-SLP [phone]  [facility] HospitalORTER,Selestino Nila 02/17/2016, 10:50 PM  [facility] [address] [place], [place], [address] Phone: [phone]   Fax:  [phone]  Name: Elray Mcgregoraul D Bakken MRN: [id number] Date of Birth: [birth year]

## 2016-02-18 ENCOUNTER — Encounter: Payer: Self-pay | Admitting: Pharmacist Clinician (PhC)/ Clinical Pharmacy Specialist

## 2016-02-27 DIAGNOSIS — Z8673 Personal history of transient ischemic attack (TIA), and cerebral infarction without residual deficits: Secondary | ICD-10-CM | POA: Diagnosis not present

## 2016-02-27 DIAGNOSIS — I480 Paroxysmal atrial fibrillation: Secondary | ICD-10-CM | POA: Diagnosis not present

## 2016-02-28 ENCOUNTER — Ambulatory Visit (INDEPENDENT_AMBULATORY_CARE_PROVIDER_SITE_OTHER): Payer: Medicare Other | Admitting: Urology

## 2016-02-28 DIAGNOSIS — R3915 Urgency of urination: Secondary | ICD-10-CM

## 2016-02-28 DIAGNOSIS — R351 Nocturia: Secondary | ICD-10-CM

## 2016-02-28 DIAGNOSIS — N401 Enlarged prostate with lower urinary tract symptoms: Secondary | ICD-10-CM | POA: Diagnosis not present

## 2016-03-02 DIAGNOSIS — R278 Other lack of coordination: Secondary | ICD-10-CM | POA: Diagnosis not present

## 2016-03-02 DIAGNOSIS — Z7901 Long term (current) use of anticoagulants: Secondary | ICD-10-CM | POA: Diagnosis not present

## 2016-03-02 DIAGNOSIS — Z9181 History of falling: Secondary | ICD-10-CM | POA: Diagnosis not present

## 2016-03-02 DIAGNOSIS — R2681 Unsteadiness on feet: Secondary | ICD-10-CM | POA: Diagnosis not present

## 2016-03-02 DIAGNOSIS — M6281 Muscle weakness (generalized): Secondary | ICD-10-CM | POA: Diagnosis not present

## 2016-03-02 DIAGNOSIS — I4891 Unspecified atrial fibrillation: Secondary | ICD-10-CM | POA: Diagnosis not present

## 2016-03-02 DIAGNOSIS — G20 Parkinson's disease: Secondary | ICD-10-CM | POA: Diagnosis not present

## 2016-03-02 DIAGNOSIS — R1310 Dysphagia, unspecified: Secondary | ICD-10-CM | POA: Diagnosis not present

## 2016-03-03 DIAGNOSIS — G20 Parkinson's disease: Secondary | ICD-10-CM | POA: Diagnosis not present

## 2016-03-03 DIAGNOSIS — R1310 Dysphagia, unspecified: Secondary | ICD-10-CM | POA: Diagnosis not present

## 2016-03-03 DIAGNOSIS — M6281 Muscle weakness (generalized): Secondary | ICD-10-CM | POA: Diagnosis not present

## 2016-03-03 DIAGNOSIS — R2681 Unsteadiness on feet: Secondary | ICD-10-CM | POA: Diagnosis not present

## 2016-03-03 DIAGNOSIS — I4891 Unspecified atrial fibrillation: Secondary | ICD-10-CM | POA: Diagnosis not present

## 2016-03-03 DIAGNOSIS — Z9181 History of falling: Secondary | ICD-10-CM | POA: Diagnosis not present

## 2016-03-03 DIAGNOSIS — R278 Other lack of coordination: Secondary | ICD-10-CM | POA: Diagnosis not present

## 2016-03-04 ENCOUNTER — Encounter (INDEPENDENT_AMBULATORY_CARE_PROVIDER_SITE_OTHER): Payer: Self-pay

## 2016-03-04 DIAGNOSIS — G20 Parkinson's disease: Secondary | ICD-10-CM | POA: Diagnosis not present

## 2016-03-04 DIAGNOSIS — M6281 Muscle weakness (generalized): Secondary | ICD-10-CM | POA: Diagnosis not present

## 2016-03-04 DIAGNOSIS — R2681 Unsteadiness on feet: Secondary | ICD-10-CM | POA: Diagnosis not present

## 2016-03-04 DIAGNOSIS — R278 Other lack of coordination: Secondary | ICD-10-CM | POA: Diagnosis not present

## 2016-03-04 DIAGNOSIS — I4891 Unspecified atrial fibrillation: Secondary | ICD-10-CM | POA: Diagnosis not present

## 2016-03-04 DIAGNOSIS — E876 Hypokalemia: Secondary | ICD-10-CM | POA: Diagnosis not present

## 2016-03-04 DIAGNOSIS — R1310 Dysphagia, unspecified: Secondary | ICD-10-CM | POA: Diagnosis not present

## 2016-03-04 DIAGNOSIS — R413 Other amnesia: Secondary | ICD-10-CM | POA: Diagnosis not present

## 2016-03-04 DIAGNOSIS — Z9181 History of falling: Secondary | ICD-10-CM | POA: Diagnosis not present

## 2016-03-05 DIAGNOSIS — R278 Other lack of coordination: Secondary | ICD-10-CM | POA: Diagnosis not present

## 2016-03-05 DIAGNOSIS — I4891 Unspecified atrial fibrillation: Secondary | ICD-10-CM | POA: Diagnosis not present

## 2016-03-05 DIAGNOSIS — R2681 Unsteadiness on feet: Secondary | ICD-10-CM | POA: Diagnosis not present

## 2016-03-05 DIAGNOSIS — M6281 Muscle weakness (generalized): Secondary | ICD-10-CM | POA: Diagnosis not present

## 2016-03-05 DIAGNOSIS — I480 Paroxysmal atrial fibrillation: Secondary | ICD-10-CM | POA: Diagnosis not present

## 2016-03-05 DIAGNOSIS — G20 Parkinson's disease: Secondary | ICD-10-CM | POA: Diagnosis not present

## 2016-03-05 DIAGNOSIS — Z9181 History of falling: Secondary | ICD-10-CM | POA: Diagnosis not present

## 2016-03-05 DIAGNOSIS — Z8673 Personal history of transient ischemic attack (TIA), and cerebral infarction without residual deficits: Secondary | ICD-10-CM | POA: Diagnosis not present

## 2016-03-05 DIAGNOSIS — R1310 Dysphagia, unspecified: Secondary | ICD-10-CM | POA: Diagnosis not present

## 2016-03-05 DIAGNOSIS — Z7901 Long term (current) use of anticoagulants: Secondary | ICD-10-CM | POA: Diagnosis not present

## 2016-03-06 DIAGNOSIS — M6281 Muscle weakness (generalized): Secondary | ICD-10-CM | POA: Diagnosis not present

## 2016-03-06 DIAGNOSIS — R2681 Unsteadiness on feet: Secondary | ICD-10-CM | POA: Diagnosis not present

## 2016-03-06 DIAGNOSIS — Z9181 History of falling: Secondary | ICD-10-CM | POA: Diagnosis not present

## 2016-03-06 DIAGNOSIS — R278 Other lack of coordination: Secondary | ICD-10-CM | POA: Diagnosis not present

## 2016-03-06 DIAGNOSIS — R1310 Dysphagia, unspecified: Secondary | ICD-10-CM | POA: Diagnosis not present

## 2016-03-06 DIAGNOSIS — G20 Parkinson's disease: Secondary | ICD-10-CM | POA: Diagnosis not present

## 2016-03-06 DIAGNOSIS — I4891 Unspecified atrial fibrillation: Secondary | ICD-10-CM | POA: Diagnosis not present

## 2016-03-07 DIAGNOSIS — G20 Parkinson's disease: Secondary | ICD-10-CM | POA: Diagnosis not present

## 2016-03-07 DIAGNOSIS — R278 Other lack of coordination: Secondary | ICD-10-CM | POA: Diagnosis not present

## 2016-03-07 DIAGNOSIS — R1310 Dysphagia, unspecified: Secondary | ICD-10-CM | POA: Diagnosis not present

## 2016-03-07 DIAGNOSIS — R2681 Unsteadiness on feet: Secondary | ICD-10-CM | POA: Diagnosis not present

## 2016-03-07 DIAGNOSIS — M6281 Muscle weakness (generalized): Secondary | ICD-10-CM | POA: Diagnosis not present

## 2016-03-07 DIAGNOSIS — Z9181 History of falling: Secondary | ICD-10-CM | POA: Diagnosis not present

## 2016-03-07 DIAGNOSIS — I4891 Unspecified atrial fibrillation: Secondary | ICD-10-CM | POA: Diagnosis not present

## 2016-03-09 DIAGNOSIS — R1310 Dysphagia, unspecified: Secondary | ICD-10-CM | POA: Diagnosis not present

## 2016-03-09 DIAGNOSIS — I4891 Unspecified atrial fibrillation: Secondary | ICD-10-CM | POA: Diagnosis not present

## 2016-03-09 DIAGNOSIS — R278 Other lack of coordination: Secondary | ICD-10-CM | POA: Diagnosis not present

## 2016-03-09 DIAGNOSIS — M6281 Muscle weakness (generalized): Secondary | ICD-10-CM | POA: Diagnosis not present

## 2016-03-09 DIAGNOSIS — R2681 Unsteadiness on feet: Secondary | ICD-10-CM | POA: Diagnosis not present

## 2016-03-09 DIAGNOSIS — G20 Parkinson's disease: Secondary | ICD-10-CM | POA: Diagnosis not present

## 2016-03-09 DIAGNOSIS — Z9181 History of falling: Secondary | ICD-10-CM | POA: Diagnosis not present

## 2016-03-10 DIAGNOSIS — I4891 Unspecified atrial fibrillation: Secondary | ICD-10-CM | POA: Diagnosis not present

## 2016-03-10 DIAGNOSIS — R2681 Unsteadiness on feet: Secondary | ICD-10-CM | POA: Diagnosis not present

## 2016-03-10 DIAGNOSIS — Z9181 History of falling: Secondary | ICD-10-CM | POA: Diagnosis not present

## 2016-03-10 DIAGNOSIS — R1310 Dysphagia, unspecified: Secondary | ICD-10-CM | POA: Diagnosis not present

## 2016-03-10 DIAGNOSIS — G20 Parkinson's disease: Secondary | ICD-10-CM | POA: Diagnosis not present

## 2016-03-10 DIAGNOSIS — R278 Other lack of coordination: Secondary | ICD-10-CM | POA: Diagnosis not present

## 2016-03-10 DIAGNOSIS — M6281 Muscle weakness (generalized): Secondary | ICD-10-CM | POA: Diagnosis not present

## 2016-03-11 DIAGNOSIS — Z9181 History of falling: Secondary | ICD-10-CM | POA: Diagnosis not present

## 2016-03-11 DIAGNOSIS — R2681 Unsteadiness on feet: Secondary | ICD-10-CM | POA: Diagnosis not present

## 2016-03-11 DIAGNOSIS — I4891 Unspecified atrial fibrillation: Secondary | ICD-10-CM | POA: Diagnosis not present

## 2016-03-11 DIAGNOSIS — G20 Parkinson's disease: Secondary | ICD-10-CM | POA: Diagnosis not present

## 2016-03-11 DIAGNOSIS — R1310 Dysphagia, unspecified: Secondary | ICD-10-CM | POA: Diagnosis not present

## 2016-03-11 DIAGNOSIS — M6281 Muscle weakness (generalized): Secondary | ICD-10-CM | POA: Diagnosis not present

## 2016-03-11 DIAGNOSIS — R278 Other lack of coordination: Secondary | ICD-10-CM | POA: Diagnosis not present

## 2016-03-12 DIAGNOSIS — M6281 Muscle weakness (generalized): Secondary | ICD-10-CM | POA: Diagnosis not present

## 2016-03-12 DIAGNOSIS — G20 Parkinson's disease: Secondary | ICD-10-CM | POA: Diagnosis not present

## 2016-03-12 DIAGNOSIS — Z9181 History of falling: Secondary | ICD-10-CM | POA: Diagnosis not present

## 2016-03-12 DIAGNOSIS — I4891 Unspecified atrial fibrillation: Secondary | ICD-10-CM | POA: Diagnosis not present

## 2016-03-12 DIAGNOSIS — R278 Other lack of coordination: Secondary | ICD-10-CM | POA: Diagnosis not present

## 2016-03-12 DIAGNOSIS — R2681 Unsteadiness on feet: Secondary | ICD-10-CM | POA: Diagnosis not present

## 2016-03-12 DIAGNOSIS — R1310 Dysphagia, unspecified: Secondary | ICD-10-CM | POA: Diagnosis not present

## 2016-03-13 DIAGNOSIS — M6281 Muscle weakness (generalized): Secondary | ICD-10-CM | POA: Diagnosis not present

## 2016-03-13 DIAGNOSIS — Z9181 History of falling: Secondary | ICD-10-CM | POA: Diagnosis not present

## 2016-03-13 DIAGNOSIS — Z7901 Long term (current) use of anticoagulants: Secondary | ICD-10-CM | POA: Diagnosis not present

## 2016-03-13 DIAGNOSIS — I4891 Unspecified atrial fibrillation: Secondary | ICD-10-CM | POA: Diagnosis not present

## 2016-03-13 DIAGNOSIS — N400 Enlarged prostate without lower urinary tract symptoms: Secondary | ICD-10-CM | POA: Diagnosis not present

## 2016-03-13 DIAGNOSIS — R1310 Dysphagia, unspecified: Secondary | ICD-10-CM | POA: Diagnosis not present

## 2016-03-13 DIAGNOSIS — G20 Parkinson's disease: Secondary | ICD-10-CM | POA: Diagnosis not present

## 2016-03-13 DIAGNOSIS — R278 Other lack of coordination: Secondary | ICD-10-CM | POA: Diagnosis not present

## 2016-03-13 DIAGNOSIS — R2681 Unsteadiness on feet: Secondary | ICD-10-CM | POA: Diagnosis not present

## 2016-03-13 DIAGNOSIS — K5150 Left sided colitis without complications: Secondary | ICD-10-CM | POA: Diagnosis not present

## 2016-03-14 DIAGNOSIS — G20 Parkinson's disease: Secondary | ICD-10-CM | POA: Diagnosis not present

## 2016-03-14 DIAGNOSIS — I4891 Unspecified atrial fibrillation: Secondary | ICD-10-CM | POA: Diagnosis not present

## 2016-03-14 DIAGNOSIS — Z9181 History of falling: Secondary | ICD-10-CM | POA: Diagnosis not present

## 2016-03-14 DIAGNOSIS — M6281 Muscle weakness (generalized): Secondary | ICD-10-CM | POA: Diagnosis not present

## 2016-03-14 DIAGNOSIS — R1310 Dysphagia, unspecified: Secondary | ICD-10-CM | POA: Diagnosis not present

## 2016-03-14 DIAGNOSIS — R2681 Unsteadiness on feet: Secondary | ICD-10-CM | POA: Diagnosis not present

## 2016-03-14 DIAGNOSIS — R278 Other lack of coordination: Secondary | ICD-10-CM | POA: Diagnosis not present

## 2016-03-16 DIAGNOSIS — R2681 Unsteadiness on feet: Secondary | ICD-10-CM | POA: Diagnosis not present

## 2016-03-16 DIAGNOSIS — M6281 Muscle weakness (generalized): Secondary | ICD-10-CM | POA: Diagnosis not present

## 2016-03-16 DIAGNOSIS — Z9181 History of falling: Secondary | ICD-10-CM | POA: Diagnosis not present

## 2016-03-16 DIAGNOSIS — I4891 Unspecified atrial fibrillation: Secondary | ICD-10-CM | POA: Diagnosis not present

## 2016-03-16 DIAGNOSIS — G20 Parkinson's disease: Secondary | ICD-10-CM | POA: Diagnosis not present

## 2016-03-16 DIAGNOSIS — Z7901 Long term (current) use of anticoagulants: Secondary | ICD-10-CM | POA: Diagnosis not present

## 2016-03-16 DIAGNOSIS — R278 Other lack of coordination: Secondary | ICD-10-CM | POA: Diagnosis not present

## 2016-03-16 DIAGNOSIS — R1310 Dysphagia, unspecified: Secondary | ICD-10-CM | POA: Diagnosis not present

## 2016-03-17 DIAGNOSIS — Z9181 History of falling: Secondary | ICD-10-CM | POA: Diagnosis not present

## 2016-03-17 DIAGNOSIS — G20 Parkinson's disease: Secondary | ICD-10-CM | POA: Diagnosis not present

## 2016-03-17 DIAGNOSIS — R278 Other lack of coordination: Secondary | ICD-10-CM | POA: Diagnosis not present

## 2016-03-17 DIAGNOSIS — M6281 Muscle weakness (generalized): Secondary | ICD-10-CM | POA: Diagnosis not present

## 2016-03-17 DIAGNOSIS — R1310 Dysphagia, unspecified: Secondary | ICD-10-CM | POA: Diagnosis not present

## 2016-03-17 DIAGNOSIS — I4891 Unspecified atrial fibrillation: Secondary | ICD-10-CM | POA: Diagnosis not present

## 2016-03-17 DIAGNOSIS — R2681 Unsteadiness on feet: Secondary | ICD-10-CM | POA: Diagnosis not present

## 2016-03-18 DIAGNOSIS — Z9181 History of falling: Secondary | ICD-10-CM | POA: Diagnosis not present

## 2016-03-18 DIAGNOSIS — M6281 Muscle weakness (generalized): Secondary | ICD-10-CM | POA: Diagnosis not present

## 2016-03-18 DIAGNOSIS — R278 Other lack of coordination: Secondary | ICD-10-CM | POA: Diagnosis not present

## 2016-03-18 DIAGNOSIS — R2681 Unsteadiness on feet: Secondary | ICD-10-CM | POA: Diagnosis not present

## 2016-03-18 DIAGNOSIS — R1310 Dysphagia, unspecified: Secondary | ICD-10-CM | POA: Diagnosis not present

## 2016-03-18 DIAGNOSIS — G20 Parkinson's disease: Secondary | ICD-10-CM | POA: Diagnosis not present

## 2016-03-18 DIAGNOSIS — I4891 Unspecified atrial fibrillation: Secondary | ICD-10-CM | POA: Diagnosis not present

## 2016-03-19 DIAGNOSIS — Z9181 History of falling: Secondary | ICD-10-CM | POA: Diagnosis not present

## 2016-03-19 DIAGNOSIS — M6281 Muscle weakness (generalized): Secondary | ICD-10-CM | POA: Diagnosis not present

## 2016-03-19 DIAGNOSIS — R1310 Dysphagia, unspecified: Secondary | ICD-10-CM | POA: Diagnosis not present

## 2016-03-19 DIAGNOSIS — G20 Parkinson's disease: Secondary | ICD-10-CM | POA: Diagnosis not present

## 2016-03-19 DIAGNOSIS — R278 Other lack of coordination: Secondary | ICD-10-CM | POA: Diagnosis not present

## 2016-03-19 DIAGNOSIS — R2681 Unsteadiness on feet: Secondary | ICD-10-CM | POA: Diagnosis not present

## 2016-03-19 DIAGNOSIS — Z7901 Long term (current) use of anticoagulants: Secondary | ICD-10-CM | POA: Diagnosis not present

## 2016-03-19 DIAGNOSIS — I4891 Unspecified atrial fibrillation: Secondary | ICD-10-CM | POA: Diagnosis not present

## 2016-03-20 DIAGNOSIS — I491 Atrial premature depolarization: Secondary | ICD-10-CM | POA: Diagnosis not present

## 2016-03-20 DIAGNOSIS — G20 Parkinson's disease: Secondary | ICD-10-CM | POA: Diagnosis not present

## 2016-03-20 DIAGNOSIS — R2681 Unsteadiness on feet: Secondary | ICD-10-CM | POA: Diagnosis not present

## 2016-03-20 DIAGNOSIS — M6281 Muscle weakness (generalized): Secondary | ICD-10-CM | POA: Diagnosis not present

## 2016-03-20 DIAGNOSIS — R278 Other lack of coordination: Secondary | ICD-10-CM | POA: Diagnosis not present

## 2016-03-20 DIAGNOSIS — R1310 Dysphagia, unspecified: Secondary | ICD-10-CM | POA: Diagnosis not present

## 2016-03-20 DIAGNOSIS — Z9181 History of falling: Secondary | ICD-10-CM | POA: Diagnosis not present

## 2016-03-20 DIAGNOSIS — E876 Hypokalemia: Secondary | ICD-10-CM | POA: Diagnosis not present

## 2016-03-20 DIAGNOSIS — Z7901 Long term (current) use of anticoagulants: Secondary | ICD-10-CM | POA: Diagnosis not present

## 2016-03-20 DIAGNOSIS — I4891 Unspecified atrial fibrillation: Secondary | ICD-10-CM | POA: Diagnosis not present

## 2016-03-21 DIAGNOSIS — R2681 Unsteadiness on feet: Secondary | ICD-10-CM | POA: Diagnosis not present

## 2016-03-21 DIAGNOSIS — R1310 Dysphagia, unspecified: Secondary | ICD-10-CM | POA: Diagnosis not present

## 2016-03-21 DIAGNOSIS — Z9181 History of falling: Secondary | ICD-10-CM | POA: Diagnosis not present

## 2016-03-21 DIAGNOSIS — R278 Other lack of coordination: Secondary | ICD-10-CM | POA: Diagnosis not present

## 2016-03-21 DIAGNOSIS — G20 Parkinson's disease: Secondary | ICD-10-CM | POA: Diagnosis not present

## 2016-03-21 DIAGNOSIS — M6281 Muscle weakness (generalized): Secondary | ICD-10-CM | POA: Diagnosis not present

## 2016-03-21 DIAGNOSIS — I4891 Unspecified atrial fibrillation: Secondary | ICD-10-CM | POA: Diagnosis not present

## 2016-03-23 DIAGNOSIS — M6281 Muscle weakness (generalized): Secondary | ICD-10-CM | POA: Diagnosis not present

## 2016-03-23 DIAGNOSIS — R1310 Dysphagia, unspecified: Secondary | ICD-10-CM | POA: Diagnosis not present

## 2016-03-23 DIAGNOSIS — R278 Other lack of coordination: Secondary | ICD-10-CM | POA: Diagnosis not present

## 2016-03-23 DIAGNOSIS — Z9181 History of falling: Secondary | ICD-10-CM | POA: Diagnosis not present

## 2016-03-23 DIAGNOSIS — R2681 Unsteadiness on feet: Secondary | ICD-10-CM | POA: Diagnosis not present

## 2016-03-23 DIAGNOSIS — I4891 Unspecified atrial fibrillation: Secondary | ICD-10-CM | POA: Diagnosis not present

## 2016-03-23 DIAGNOSIS — G20 Parkinson's disease: Secondary | ICD-10-CM | POA: Diagnosis not present

## 2016-03-24 DIAGNOSIS — I4891 Unspecified atrial fibrillation: Secondary | ICD-10-CM | POA: Diagnosis not present

## 2016-03-24 DIAGNOSIS — R1310 Dysphagia, unspecified: Secondary | ICD-10-CM | POA: Diagnosis not present

## 2016-03-24 DIAGNOSIS — Z9181 History of falling: Secondary | ICD-10-CM | POA: Diagnosis not present

## 2016-03-24 DIAGNOSIS — M6281 Muscle weakness (generalized): Secondary | ICD-10-CM | POA: Diagnosis not present

## 2016-03-24 DIAGNOSIS — R278 Other lack of coordination: Secondary | ICD-10-CM | POA: Diagnosis not present

## 2016-03-24 DIAGNOSIS — R2681 Unsteadiness on feet: Secondary | ICD-10-CM | POA: Diagnosis not present

## 2016-03-24 DIAGNOSIS — G20 Parkinson's disease: Secondary | ICD-10-CM | POA: Diagnosis not present

## 2016-03-25 DIAGNOSIS — R278 Other lack of coordination: Secondary | ICD-10-CM | POA: Diagnosis not present

## 2016-03-25 DIAGNOSIS — M6281 Muscle weakness (generalized): Secondary | ICD-10-CM | POA: Diagnosis not present

## 2016-03-25 DIAGNOSIS — I491 Atrial premature depolarization: Secondary | ICD-10-CM | POA: Diagnosis not present

## 2016-03-25 DIAGNOSIS — R2681 Unsteadiness on feet: Secondary | ICD-10-CM | POA: Diagnosis not present

## 2016-03-25 DIAGNOSIS — R1310 Dysphagia, unspecified: Secondary | ICD-10-CM | POA: Diagnosis not present

## 2016-03-25 DIAGNOSIS — Z9181 History of falling: Secondary | ICD-10-CM | POA: Diagnosis not present

## 2016-03-25 DIAGNOSIS — I4891 Unspecified atrial fibrillation: Secondary | ICD-10-CM | POA: Diagnosis not present

## 2016-03-25 DIAGNOSIS — G20 Parkinson's disease: Secondary | ICD-10-CM | POA: Diagnosis not present

## 2016-03-26 DIAGNOSIS — R1310 Dysphagia, unspecified: Secondary | ICD-10-CM | POA: Diagnosis not present

## 2016-03-26 DIAGNOSIS — G20 Parkinson's disease: Secondary | ICD-10-CM | POA: Diagnosis not present

## 2016-03-26 DIAGNOSIS — R278 Other lack of coordination: Secondary | ICD-10-CM | POA: Diagnosis not present

## 2016-03-26 DIAGNOSIS — Z9181 History of falling: Secondary | ICD-10-CM | POA: Diagnosis not present

## 2016-03-26 DIAGNOSIS — M6281 Muscle weakness (generalized): Secondary | ICD-10-CM | POA: Diagnosis not present

## 2016-03-26 DIAGNOSIS — R2681 Unsteadiness on feet: Secondary | ICD-10-CM | POA: Diagnosis not present

## 2016-03-26 DIAGNOSIS — I4891 Unspecified atrial fibrillation: Secondary | ICD-10-CM | POA: Diagnosis not present

## 2016-03-27 DIAGNOSIS — R1310 Dysphagia, unspecified: Secondary | ICD-10-CM | POA: Diagnosis not present

## 2016-03-27 DIAGNOSIS — M6281 Muscle weakness (generalized): Secondary | ICD-10-CM | POA: Diagnosis not present

## 2016-03-27 DIAGNOSIS — Z9181 History of falling: Secondary | ICD-10-CM | POA: Diagnosis not present

## 2016-03-27 DIAGNOSIS — G20 Parkinson's disease: Secondary | ICD-10-CM | POA: Diagnosis not present

## 2016-03-27 DIAGNOSIS — R278 Other lack of coordination: Secondary | ICD-10-CM | POA: Diagnosis not present

## 2016-03-27 DIAGNOSIS — I4891 Unspecified atrial fibrillation: Secondary | ICD-10-CM | POA: Diagnosis not present

## 2016-03-27 DIAGNOSIS — R2681 Unsteadiness on feet: Secondary | ICD-10-CM | POA: Diagnosis not present

## 2016-03-27 DIAGNOSIS — Z7901 Long term (current) use of anticoagulants: Secondary | ICD-10-CM | POA: Diagnosis not present

## 2016-03-28 DIAGNOSIS — R2681 Unsteadiness on feet: Secondary | ICD-10-CM | POA: Diagnosis not present

## 2016-03-28 DIAGNOSIS — R278 Other lack of coordination: Secondary | ICD-10-CM | POA: Diagnosis not present

## 2016-03-28 DIAGNOSIS — Z9181 History of falling: Secondary | ICD-10-CM | POA: Diagnosis not present

## 2016-03-28 DIAGNOSIS — R1310 Dysphagia, unspecified: Secondary | ICD-10-CM | POA: Diagnosis not present

## 2016-03-28 DIAGNOSIS — I4891 Unspecified atrial fibrillation: Secondary | ICD-10-CM | POA: Diagnosis not present

## 2016-03-28 DIAGNOSIS — M6281 Muscle weakness (generalized): Secondary | ICD-10-CM | POA: Diagnosis not present

## 2016-03-28 DIAGNOSIS — G20 Parkinson's disease: Secondary | ICD-10-CM | POA: Diagnosis not present

## 2016-03-29 DIAGNOSIS — R1310 Dysphagia, unspecified: Secondary | ICD-10-CM | POA: Diagnosis not present

## 2016-03-29 DIAGNOSIS — Z9181 History of falling: Secondary | ICD-10-CM | POA: Diagnosis not present

## 2016-03-29 DIAGNOSIS — R278 Other lack of coordination: Secondary | ICD-10-CM | POA: Diagnosis not present

## 2016-03-29 DIAGNOSIS — M6281 Muscle weakness (generalized): Secondary | ICD-10-CM | POA: Diagnosis not present

## 2016-03-29 DIAGNOSIS — G20 Parkinson's disease: Secondary | ICD-10-CM | POA: Diagnosis not present

## 2016-03-29 DIAGNOSIS — I4891 Unspecified atrial fibrillation: Secondary | ICD-10-CM | POA: Diagnosis not present

## 2016-03-29 DIAGNOSIS — R2681 Unsteadiness on feet: Secondary | ICD-10-CM | POA: Diagnosis not present

## 2016-03-30 DIAGNOSIS — Z7901 Long term (current) use of anticoagulants: Secondary | ICD-10-CM | POA: Diagnosis not present

## 2016-03-30 DIAGNOSIS — G20 Parkinson's disease: Secondary | ICD-10-CM | POA: Diagnosis not present

## 2016-03-30 DIAGNOSIS — R278 Other lack of coordination: Secondary | ICD-10-CM | POA: Diagnosis not present

## 2016-03-30 DIAGNOSIS — I4891 Unspecified atrial fibrillation: Secondary | ICD-10-CM | POA: Diagnosis not present

## 2016-03-30 DIAGNOSIS — R2681 Unsteadiness on feet: Secondary | ICD-10-CM | POA: Diagnosis not present

## 2016-03-30 DIAGNOSIS — R1310 Dysphagia, unspecified: Secondary | ICD-10-CM | POA: Diagnosis not present

## 2016-03-30 DIAGNOSIS — Z9181 History of falling: Secondary | ICD-10-CM | POA: Diagnosis not present

## 2016-03-30 DIAGNOSIS — M6281 Muscle weakness (generalized): Secondary | ICD-10-CM | POA: Diagnosis not present

## 2016-03-31 DIAGNOSIS — R2681 Unsteadiness on feet: Secondary | ICD-10-CM | POA: Diagnosis not present

## 2016-03-31 DIAGNOSIS — G20 Parkinson's disease: Secondary | ICD-10-CM | POA: Diagnosis not present

## 2016-03-31 DIAGNOSIS — R1310 Dysphagia, unspecified: Secondary | ICD-10-CM | POA: Diagnosis not present

## 2016-03-31 DIAGNOSIS — M6281 Muscle weakness (generalized): Secondary | ICD-10-CM | POA: Diagnosis not present

## 2016-03-31 DIAGNOSIS — Z9181 History of falling: Secondary | ICD-10-CM | POA: Diagnosis not present

## 2016-03-31 DIAGNOSIS — I4891 Unspecified atrial fibrillation: Secondary | ICD-10-CM | POA: Diagnosis not present

## 2016-03-31 DIAGNOSIS — R278 Other lack of coordination: Secondary | ICD-10-CM | POA: Diagnosis not present

## 2016-04-01 DIAGNOSIS — I4891 Unspecified atrial fibrillation: Secondary | ICD-10-CM | POA: Diagnosis not present

## 2016-04-01 DIAGNOSIS — R1310 Dysphagia, unspecified: Secondary | ICD-10-CM | POA: Diagnosis not present

## 2016-04-01 DIAGNOSIS — R278 Other lack of coordination: Secondary | ICD-10-CM | POA: Diagnosis not present

## 2016-04-01 DIAGNOSIS — M6281 Muscle weakness (generalized): Secondary | ICD-10-CM | POA: Diagnosis not present

## 2016-04-01 DIAGNOSIS — R2681 Unsteadiness on feet: Secondary | ICD-10-CM | POA: Diagnosis not present

## 2016-04-01 DIAGNOSIS — G20 Parkinson's disease: Secondary | ICD-10-CM | POA: Diagnosis not present

## 2016-04-01 DIAGNOSIS — Z9181 History of falling: Secondary | ICD-10-CM | POA: Diagnosis not present

## 2016-04-02 DIAGNOSIS — R278 Other lack of coordination: Secondary | ICD-10-CM | POA: Diagnosis not present

## 2016-04-02 DIAGNOSIS — R1310 Dysphagia, unspecified: Secondary | ICD-10-CM | POA: Diagnosis not present

## 2016-04-02 DIAGNOSIS — M6281 Muscle weakness (generalized): Secondary | ICD-10-CM | POA: Diagnosis not present

## 2016-04-02 DIAGNOSIS — Z7901 Long term (current) use of anticoagulants: Secondary | ICD-10-CM | POA: Diagnosis not present

## 2016-04-02 DIAGNOSIS — G20 Parkinson's disease: Secondary | ICD-10-CM | POA: Diagnosis not present

## 2016-04-02 DIAGNOSIS — R2681 Unsteadiness on feet: Secondary | ICD-10-CM | POA: Diagnosis not present

## 2016-04-02 DIAGNOSIS — Z9181 History of falling: Secondary | ICD-10-CM | POA: Diagnosis not present

## 2016-04-02 DIAGNOSIS — I4891 Unspecified atrial fibrillation: Secondary | ICD-10-CM | POA: Diagnosis not present

## 2016-04-03 DIAGNOSIS — R278 Other lack of coordination: Secondary | ICD-10-CM | POA: Diagnosis not present

## 2016-04-03 DIAGNOSIS — I4891 Unspecified atrial fibrillation: Secondary | ICD-10-CM | POA: Diagnosis not present

## 2016-04-03 DIAGNOSIS — R4182 Altered mental status, unspecified: Secondary | ICD-10-CM | POA: Diagnosis not present

## 2016-04-03 DIAGNOSIS — Z9181 History of falling: Secondary | ICD-10-CM | POA: Diagnosis not present

## 2016-04-03 DIAGNOSIS — R1310 Dysphagia, unspecified: Secondary | ICD-10-CM | POA: Diagnosis not present

## 2016-04-03 DIAGNOSIS — G20 Parkinson's disease: Secondary | ICD-10-CM | POA: Diagnosis not present

## 2016-04-03 DIAGNOSIS — R2681 Unsteadiness on feet: Secondary | ICD-10-CM | POA: Diagnosis not present

## 2016-04-03 DIAGNOSIS — M6281 Muscle weakness (generalized): Secondary | ICD-10-CM | POA: Diagnosis not present

## 2016-04-06 DIAGNOSIS — Z9181 History of falling: Secondary | ICD-10-CM | POA: Diagnosis not present

## 2016-04-06 DIAGNOSIS — M6281 Muscle weakness (generalized): Secondary | ICD-10-CM | POA: Diagnosis not present

## 2016-04-06 DIAGNOSIS — I4891 Unspecified atrial fibrillation: Secondary | ICD-10-CM | POA: Diagnosis not present

## 2016-04-06 DIAGNOSIS — R278 Other lack of coordination: Secondary | ICD-10-CM | POA: Diagnosis not present

## 2016-04-06 DIAGNOSIS — Z7901 Long term (current) use of anticoagulants: Secondary | ICD-10-CM | POA: Diagnosis not present

## 2016-04-06 DIAGNOSIS — R1310 Dysphagia, unspecified: Secondary | ICD-10-CM | POA: Diagnosis not present

## 2016-04-06 DIAGNOSIS — G20 Parkinson's disease: Secondary | ICD-10-CM | POA: Diagnosis not present

## 2016-04-06 DIAGNOSIS — R2681 Unsteadiness on feet: Secondary | ICD-10-CM | POA: Diagnosis not present

## 2016-04-07 DIAGNOSIS — I4891 Unspecified atrial fibrillation: Secondary | ICD-10-CM | POA: Diagnosis not present

## 2016-04-07 DIAGNOSIS — R2681 Unsteadiness on feet: Secondary | ICD-10-CM | POA: Diagnosis not present

## 2016-04-07 DIAGNOSIS — R1310 Dysphagia, unspecified: Secondary | ICD-10-CM | POA: Diagnosis not present

## 2016-04-07 DIAGNOSIS — M6281 Muscle weakness (generalized): Secondary | ICD-10-CM | POA: Diagnosis not present

## 2016-04-07 DIAGNOSIS — R278 Other lack of coordination: Secondary | ICD-10-CM | POA: Diagnosis not present

## 2016-04-07 DIAGNOSIS — Z9181 History of falling: Secondary | ICD-10-CM | POA: Diagnosis not present

## 2016-04-07 DIAGNOSIS — G20 Parkinson's disease: Secondary | ICD-10-CM | POA: Diagnosis not present

## 2016-04-08 DIAGNOSIS — G20 Parkinson's disease: Secondary | ICD-10-CM | POA: Diagnosis not present

## 2016-04-08 DIAGNOSIS — I4891 Unspecified atrial fibrillation: Secondary | ICD-10-CM | POA: Diagnosis not present

## 2016-04-08 DIAGNOSIS — Z9181 History of falling: Secondary | ICD-10-CM | POA: Diagnosis not present

## 2016-04-08 DIAGNOSIS — R2681 Unsteadiness on feet: Secondary | ICD-10-CM | POA: Diagnosis not present

## 2016-04-08 DIAGNOSIS — R278 Other lack of coordination: Secondary | ICD-10-CM | POA: Diagnosis not present

## 2016-04-08 DIAGNOSIS — M6281 Muscle weakness (generalized): Secondary | ICD-10-CM | POA: Diagnosis not present

## 2016-04-08 DIAGNOSIS — R1310 Dysphagia, unspecified: Secondary | ICD-10-CM | POA: Diagnosis not present

## 2016-04-08 NOTE — Progress Notes (Signed)
The EEG study done today was an awake and drowsy record.

## 2016-04-09 DIAGNOSIS — G20 Parkinson's disease: Secondary | ICD-10-CM | POA: Diagnosis not present

## 2016-04-09 DIAGNOSIS — I4891 Unspecified atrial fibrillation: Secondary | ICD-10-CM | POA: Diagnosis not present

## 2016-04-09 DIAGNOSIS — R1310 Dysphagia, unspecified: Secondary | ICD-10-CM | POA: Diagnosis not present

## 2016-04-09 DIAGNOSIS — Z9181 History of falling: Secondary | ICD-10-CM | POA: Diagnosis not present

## 2016-04-09 DIAGNOSIS — M6281 Muscle weakness (generalized): Secondary | ICD-10-CM | POA: Diagnosis not present

## 2016-04-09 DIAGNOSIS — R278 Other lack of coordination: Secondary | ICD-10-CM | POA: Diagnosis not present

## 2016-04-09 DIAGNOSIS — R2681 Unsteadiness on feet: Secondary | ICD-10-CM | POA: Diagnosis not present

## 2016-04-10 DIAGNOSIS — R1310 Dysphagia, unspecified: Secondary | ICD-10-CM | POA: Diagnosis not present

## 2016-04-10 DIAGNOSIS — Z9181 History of falling: Secondary | ICD-10-CM | POA: Diagnosis not present

## 2016-04-10 DIAGNOSIS — K5150 Left sided colitis without complications: Secondary | ICD-10-CM | POA: Diagnosis not present

## 2016-04-10 DIAGNOSIS — M6281 Muscle weakness (generalized): Secondary | ICD-10-CM | POA: Diagnosis not present

## 2016-04-10 DIAGNOSIS — R278 Other lack of coordination: Secondary | ICD-10-CM | POA: Diagnosis not present

## 2016-04-10 DIAGNOSIS — I4891 Unspecified atrial fibrillation: Secondary | ICD-10-CM | POA: Diagnosis not present

## 2016-04-10 DIAGNOSIS — R2681 Unsteadiness on feet: Secondary | ICD-10-CM | POA: Diagnosis not present

## 2016-04-10 DIAGNOSIS — G20 Parkinson's disease: Secondary | ICD-10-CM | POA: Diagnosis not present

## 2016-04-13 DIAGNOSIS — R2681 Unsteadiness on feet: Secondary | ICD-10-CM | POA: Diagnosis not present

## 2016-04-13 DIAGNOSIS — M6281 Muscle weakness (generalized): Secondary | ICD-10-CM | POA: Diagnosis not present

## 2016-04-13 DIAGNOSIS — I4891 Unspecified atrial fibrillation: Secondary | ICD-10-CM | POA: Diagnosis not present

## 2016-04-13 DIAGNOSIS — R1310 Dysphagia, unspecified: Secondary | ICD-10-CM | POA: Diagnosis not present

## 2016-04-13 DIAGNOSIS — G20 Parkinson's disease: Secondary | ICD-10-CM | POA: Diagnosis not present

## 2016-04-13 DIAGNOSIS — Z9181 History of falling: Secondary | ICD-10-CM | POA: Diagnosis not present

## 2016-04-13 DIAGNOSIS — Z7901 Long term (current) use of anticoagulants: Secondary | ICD-10-CM | POA: Diagnosis not present

## 2016-04-13 DIAGNOSIS — R278 Other lack of coordination: Secondary | ICD-10-CM | POA: Diagnosis not present

## 2016-04-14 DIAGNOSIS — I4891 Unspecified atrial fibrillation: Secondary | ICD-10-CM | POA: Diagnosis not present

## 2016-04-14 DIAGNOSIS — Z9181 History of falling: Secondary | ICD-10-CM | POA: Diagnosis not present

## 2016-04-14 DIAGNOSIS — R2681 Unsteadiness on feet: Secondary | ICD-10-CM | POA: Diagnosis not present

## 2016-04-14 DIAGNOSIS — R278 Other lack of coordination: Secondary | ICD-10-CM | POA: Diagnosis not present

## 2016-04-14 DIAGNOSIS — R1310 Dysphagia, unspecified: Secondary | ICD-10-CM | POA: Diagnosis not present

## 2016-04-14 DIAGNOSIS — M6281 Muscle weakness (generalized): Secondary | ICD-10-CM | POA: Diagnosis not present

## 2016-04-14 DIAGNOSIS — G20 Parkinson's disease: Secondary | ICD-10-CM | POA: Diagnosis not present

## 2016-04-15 ENCOUNTER — Encounter: Payer: Self-pay | Admitting: Family Medicine

## 2016-04-15 DIAGNOSIS — R2681 Unsteadiness on feet: Secondary | ICD-10-CM | POA: Diagnosis not present

## 2016-04-15 DIAGNOSIS — G20 Parkinson's disease: Secondary | ICD-10-CM | POA: Diagnosis not present

## 2016-04-15 DIAGNOSIS — Z9181 History of falling: Secondary | ICD-10-CM | POA: Diagnosis not present

## 2016-04-15 DIAGNOSIS — R278 Other lack of coordination: Secondary | ICD-10-CM | POA: Diagnosis not present

## 2016-04-15 DIAGNOSIS — I4891 Unspecified atrial fibrillation: Secondary | ICD-10-CM | POA: Diagnosis not present

## 2016-04-15 DIAGNOSIS — R1310 Dysphagia, unspecified: Secondary | ICD-10-CM | POA: Diagnosis not present

## 2016-04-15 DIAGNOSIS — M6281 Muscle weakness (generalized): Secondary | ICD-10-CM | POA: Diagnosis not present

## 2016-04-16 DIAGNOSIS — I4891 Unspecified atrial fibrillation: Secondary | ICD-10-CM | POA: Diagnosis not present

## 2016-04-16 DIAGNOSIS — R278 Other lack of coordination: Secondary | ICD-10-CM | POA: Diagnosis not present

## 2016-04-16 DIAGNOSIS — Z9181 History of falling: Secondary | ICD-10-CM | POA: Diagnosis not present

## 2016-04-16 DIAGNOSIS — R1310 Dysphagia, unspecified: Secondary | ICD-10-CM | POA: Diagnosis not present

## 2016-04-16 DIAGNOSIS — R2681 Unsteadiness on feet: Secondary | ICD-10-CM | POA: Diagnosis not present

## 2016-04-16 DIAGNOSIS — M6281 Muscle weakness (generalized): Secondary | ICD-10-CM | POA: Diagnosis not present

## 2016-04-16 DIAGNOSIS — Z7901 Long term (current) use of anticoagulants: Secondary | ICD-10-CM | POA: Diagnosis not present

## 2016-04-16 DIAGNOSIS — G20 Parkinson's disease: Secondary | ICD-10-CM | POA: Diagnosis not present

## 2016-04-17 ENCOUNTER — Ambulatory Visit (INDEPENDENT_AMBULATORY_CARE_PROVIDER_SITE_OTHER): Payer: Medicare Other | Admitting: Urology

## 2016-04-17 DIAGNOSIS — N3941 Urge incontinence: Secondary | ICD-10-CM | POA: Diagnosis not present

## 2016-04-17 DIAGNOSIS — R2681 Unsteadiness on feet: Secondary | ICD-10-CM | POA: Diagnosis not present

## 2016-04-17 DIAGNOSIS — R351 Nocturia: Secondary | ICD-10-CM | POA: Diagnosis not present

## 2016-04-17 DIAGNOSIS — R4182 Altered mental status, unspecified: Secondary | ICD-10-CM | POA: Diagnosis not present

## 2016-04-17 DIAGNOSIS — M6281 Muscle weakness (generalized): Secondary | ICD-10-CM | POA: Diagnosis not present

## 2016-04-17 DIAGNOSIS — Z9181 History of falling: Secondary | ICD-10-CM | POA: Diagnosis not present

## 2016-04-17 DIAGNOSIS — G20 Parkinson's disease: Secondary | ICD-10-CM | POA: Diagnosis not present

## 2016-04-17 DIAGNOSIS — R1310 Dysphagia, unspecified: Secondary | ICD-10-CM | POA: Diagnosis not present

## 2016-04-17 DIAGNOSIS — I4891 Unspecified atrial fibrillation: Secondary | ICD-10-CM | POA: Diagnosis not present

## 2016-04-17 DIAGNOSIS — R278 Other lack of coordination: Secondary | ICD-10-CM | POA: Diagnosis not present

## 2016-04-20 DIAGNOSIS — Z9181 History of falling: Secondary | ICD-10-CM | POA: Diagnosis not present

## 2016-04-20 DIAGNOSIS — G20 Parkinson's disease: Secondary | ICD-10-CM | POA: Diagnosis not present

## 2016-04-20 DIAGNOSIS — R278 Other lack of coordination: Secondary | ICD-10-CM | POA: Diagnosis not present

## 2016-04-20 DIAGNOSIS — R1310 Dysphagia, unspecified: Secondary | ICD-10-CM | POA: Diagnosis not present

## 2016-04-20 DIAGNOSIS — M6281 Muscle weakness (generalized): Secondary | ICD-10-CM | POA: Diagnosis not present

## 2016-04-20 DIAGNOSIS — N3289 Other specified disorders of bladder: Secondary | ICD-10-CM | POA: Diagnosis not present

## 2016-04-20 DIAGNOSIS — R2681 Unsteadiness on feet: Secondary | ICD-10-CM | POA: Diagnosis not present

## 2016-04-20 DIAGNOSIS — Z7901 Long term (current) use of anticoagulants: Secondary | ICD-10-CM | POA: Diagnosis not present

## 2016-04-20 DIAGNOSIS — I4891 Unspecified atrial fibrillation: Secondary | ICD-10-CM | POA: Diagnosis not present

## 2016-04-21 DIAGNOSIS — R278 Other lack of coordination: Secondary | ICD-10-CM | POA: Diagnosis not present

## 2016-04-21 DIAGNOSIS — R1310 Dysphagia, unspecified: Secondary | ICD-10-CM | POA: Diagnosis not present

## 2016-04-21 DIAGNOSIS — Z79899 Other long term (current) drug therapy: Secondary | ICD-10-CM | POA: Diagnosis not present

## 2016-04-21 DIAGNOSIS — M6281 Muscle weakness (generalized): Secondary | ICD-10-CM | POA: Diagnosis not present

## 2016-04-21 DIAGNOSIS — R2681 Unsteadiness on feet: Secondary | ICD-10-CM | POA: Diagnosis not present

## 2016-04-21 DIAGNOSIS — Z9181 History of falling: Secondary | ICD-10-CM | POA: Diagnosis not present

## 2016-04-21 DIAGNOSIS — G20 Parkinson's disease: Secondary | ICD-10-CM | POA: Diagnosis not present

## 2016-04-21 DIAGNOSIS — I4891 Unspecified atrial fibrillation: Secondary | ICD-10-CM | POA: Diagnosis not present

## 2016-04-21 DIAGNOSIS — N390 Urinary tract infection, site not specified: Secondary | ICD-10-CM | POA: Diagnosis not present

## 2016-04-22 DIAGNOSIS — R278 Other lack of coordination: Secondary | ICD-10-CM | POA: Diagnosis not present

## 2016-04-22 DIAGNOSIS — Z9181 History of falling: Secondary | ICD-10-CM | POA: Diagnosis not present

## 2016-04-22 DIAGNOSIS — A499 Bacterial infection, unspecified: Secondary | ICD-10-CM | POA: Diagnosis not present

## 2016-04-22 DIAGNOSIS — M6281 Muscle weakness (generalized): Secondary | ICD-10-CM | POA: Diagnosis not present

## 2016-04-22 DIAGNOSIS — Z7901 Long term (current) use of anticoagulants: Secondary | ICD-10-CM | POA: Diagnosis not present

## 2016-04-22 DIAGNOSIS — G20 Parkinson's disease: Secondary | ICD-10-CM | POA: Diagnosis not present

## 2016-04-22 DIAGNOSIS — R509 Fever, unspecified: Secondary | ICD-10-CM | POA: Diagnosis not present

## 2016-04-22 DIAGNOSIS — R1310 Dysphagia, unspecified: Secondary | ICD-10-CM | POA: Diagnosis not present

## 2016-04-22 DIAGNOSIS — I4891 Unspecified atrial fibrillation: Secondary | ICD-10-CM | POA: Diagnosis not present

## 2016-04-22 DIAGNOSIS — R2681 Unsteadiness on feet: Secondary | ICD-10-CM | POA: Diagnosis not present

## 2016-04-23 DIAGNOSIS — Z9181 History of falling: Secondary | ICD-10-CM | POA: Diagnosis not present

## 2016-04-23 DIAGNOSIS — R278 Other lack of coordination: Secondary | ICD-10-CM | POA: Diagnosis not present

## 2016-04-23 DIAGNOSIS — G20 Parkinson's disease: Secondary | ICD-10-CM | POA: Diagnosis not present

## 2016-04-23 DIAGNOSIS — I4891 Unspecified atrial fibrillation: Secondary | ICD-10-CM | POA: Diagnosis not present

## 2016-04-23 DIAGNOSIS — N390 Urinary tract infection, site not specified: Secondary | ICD-10-CM | POA: Diagnosis not present

## 2016-04-23 DIAGNOSIS — R2681 Unsteadiness on feet: Secondary | ICD-10-CM | POA: Diagnosis not present

## 2016-04-23 DIAGNOSIS — R1310 Dysphagia, unspecified: Secondary | ICD-10-CM | POA: Diagnosis not present

## 2016-04-23 DIAGNOSIS — Z7901 Long term (current) use of anticoagulants: Secondary | ICD-10-CM | POA: Diagnosis not present

## 2016-04-23 DIAGNOSIS — M6281 Muscle weakness (generalized): Secondary | ICD-10-CM | POA: Diagnosis not present

## 2016-04-23 DIAGNOSIS — A499 Bacterial infection, unspecified: Secondary | ICD-10-CM | POA: Diagnosis not present

## 2016-04-24 DIAGNOSIS — R1310 Dysphagia, unspecified: Secondary | ICD-10-CM | POA: Diagnosis not present

## 2016-04-24 DIAGNOSIS — Z9181 History of falling: Secondary | ICD-10-CM | POA: Diagnosis not present

## 2016-04-24 DIAGNOSIS — G20 Parkinson's disease: Secondary | ICD-10-CM | POA: Diagnosis not present

## 2016-04-24 DIAGNOSIS — R2681 Unsteadiness on feet: Secondary | ICD-10-CM | POA: Diagnosis not present

## 2016-04-24 DIAGNOSIS — I4891 Unspecified atrial fibrillation: Secondary | ICD-10-CM | POA: Diagnosis not present

## 2016-04-24 DIAGNOSIS — M6281 Muscle weakness (generalized): Secondary | ICD-10-CM | POA: Diagnosis not present

## 2016-04-24 DIAGNOSIS — R278 Other lack of coordination: Secondary | ICD-10-CM | POA: Diagnosis not present

## 2016-04-25 DIAGNOSIS — A499 Bacterial infection, unspecified: Secondary | ICD-10-CM | POA: Diagnosis not present

## 2016-04-25 DIAGNOSIS — I4891 Unspecified atrial fibrillation: Secondary | ICD-10-CM | POA: Diagnosis not present

## 2016-04-25 DIAGNOSIS — Z7901 Long term (current) use of anticoagulants: Secondary | ICD-10-CM | POA: Diagnosis not present

## 2016-04-26 DIAGNOSIS — I4891 Unspecified atrial fibrillation: Secondary | ICD-10-CM | POA: Diagnosis not present

## 2016-04-26 DIAGNOSIS — R413 Other amnesia: Secondary | ICD-10-CM | POA: Diagnosis not present

## 2016-04-26 DIAGNOSIS — E876 Hypokalemia: Secondary | ICD-10-CM | POA: Diagnosis not present

## 2016-04-26 DIAGNOSIS — Z7901 Long term (current) use of anticoagulants: Secondary | ICD-10-CM | POA: Diagnosis not present

## 2016-04-27 DIAGNOSIS — A499 Bacterial infection, unspecified: Secondary | ICD-10-CM | POA: Diagnosis not present

## 2016-04-27 DIAGNOSIS — R1310 Dysphagia, unspecified: Secondary | ICD-10-CM | POA: Diagnosis not present

## 2016-04-27 DIAGNOSIS — I4891 Unspecified atrial fibrillation: Secondary | ICD-10-CM | POA: Diagnosis not present

## 2016-04-27 DIAGNOSIS — Z7901 Long term (current) use of anticoagulants: Secondary | ICD-10-CM | POA: Diagnosis not present

## 2016-04-27 DIAGNOSIS — G20 Parkinson's disease: Secondary | ICD-10-CM | POA: Diagnosis not present

## 2016-04-27 DIAGNOSIS — R278 Other lack of coordination: Secondary | ICD-10-CM | POA: Diagnosis not present

## 2016-04-27 DIAGNOSIS — Z9181 History of falling: Secondary | ICD-10-CM | POA: Diagnosis not present

## 2016-04-27 DIAGNOSIS — R2681 Unsteadiness on feet: Secondary | ICD-10-CM | POA: Diagnosis not present

## 2016-04-27 DIAGNOSIS — M6281 Muscle weakness (generalized): Secondary | ICD-10-CM | POA: Diagnosis not present

## 2016-04-28 DIAGNOSIS — M6281 Muscle weakness (generalized): Secondary | ICD-10-CM | POA: Diagnosis not present

## 2016-04-28 DIAGNOSIS — Z7901 Long term (current) use of anticoagulants: Secondary | ICD-10-CM | POA: Diagnosis not present

## 2016-04-28 DIAGNOSIS — A499 Bacterial infection, unspecified: Secondary | ICD-10-CM | POA: Diagnosis not present

## 2016-04-28 DIAGNOSIS — R1310 Dysphagia, unspecified: Secondary | ICD-10-CM | POA: Diagnosis not present

## 2016-04-28 DIAGNOSIS — G20 Parkinson's disease: Secondary | ICD-10-CM | POA: Diagnosis not present

## 2016-04-28 DIAGNOSIS — Z9181 History of falling: Secondary | ICD-10-CM | POA: Diagnosis not present

## 2016-04-28 DIAGNOSIS — R278 Other lack of coordination: Secondary | ICD-10-CM | POA: Diagnosis not present

## 2016-04-28 DIAGNOSIS — R2681 Unsteadiness on feet: Secondary | ICD-10-CM | POA: Diagnosis not present

## 2016-04-28 DIAGNOSIS — I4891 Unspecified atrial fibrillation: Secondary | ICD-10-CM | POA: Diagnosis not present

## 2016-04-29 DIAGNOSIS — G20 Parkinson's disease: Secondary | ICD-10-CM | POA: Diagnosis not present

## 2016-04-29 DIAGNOSIS — R2681 Unsteadiness on feet: Secondary | ICD-10-CM | POA: Diagnosis not present

## 2016-04-29 DIAGNOSIS — Z9181 History of falling: Secondary | ICD-10-CM | POA: Diagnosis not present

## 2016-04-29 DIAGNOSIS — R278 Other lack of coordination: Secondary | ICD-10-CM | POA: Diagnosis not present

## 2016-04-29 DIAGNOSIS — Z7901 Long term (current) use of anticoagulants: Secondary | ICD-10-CM | POA: Diagnosis not present

## 2016-04-29 DIAGNOSIS — I4891 Unspecified atrial fibrillation: Secondary | ICD-10-CM | POA: Diagnosis not present

## 2016-04-29 DIAGNOSIS — M6281 Muscle weakness (generalized): Secondary | ICD-10-CM | POA: Diagnosis not present

## 2016-04-29 DIAGNOSIS — R1310 Dysphagia, unspecified: Secondary | ICD-10-CM | POA: Diagnosis not present

## 2016-04-29 DIAGNOSIS — A499 Bacterial infection, unspecified: Secondary | ICD-10-CM | POA: Diagnosis not present

## 2016-04-30 DIAGNOSIS — R2681 Unsteadiness on feet: Secondary | ICD-10-CM | POA: Diagnosis not present

## 2016-04-30 DIAGNOSIS — R1310 Dysphagia, unspecified: Secondary | ICD-10-CM | POA: Diagnosis not present

## 2016-04-30 DIAGNOSIS — R278 Other lack of coordination: Secondary | ICD-10-CM | POA: Diagnosis not present

## 2016-04-30 DIAGNOSIS — Z9181 History of falling: Secondary | ICD-10-CM | POA: Diagnosis not present

## 2016-04-30 DIAGNOSIS — I4891 Unspecified atrial fibrillation: Secondary | ICD-10-CM | POA: Diagnosis not present

## 2016-04-30 DIAGNOSIS — G20 Parkinson's disease: Secondary | ICD-10-CM | POA: Diagnosis not present

## 2016-04-30 DIAGNOSIS — M6281 Muscle weakness (generalized): Secondary | ICD-10-CM | POA: Diagnosis not present

## 2016-05-01 DIAGNOSIS — R278 Other lack of coordination: Secondary | ICD-10-CM | POA: Diagnosis not present

## 2016-05-01 DIAGNOSIS — M6281 Muscle weakness (generalized): Secondary | ICD-10-CM | POA: Diagnosis not present

## 2016-05-01 DIAGNOSIS — Z9181 History of falling: Secondary | ICD-10-CM | POA: Diagnosis not present

## 2016-05-01 DIAGNOSIS — G20 Parkinson's disease: Secondary | ICD-10-CM | POA: Diagnosis not present

## 2016-05-01 DIAGNOSIS — R2681 Unsteadiness on feet: Secondary | ICD-10-CM | POA: Diagnosis not present

## 2016-05-01 DIAGNOSIS — I4891 Unspecified atrial fibrillation: Secondary | ICD-10-CM | POA: Diagnosis not present

## 2016-05-01 DIAGNOSIS — R1310 Dysphagia, unspecified: Secondary | ICD-10-CM | POA: Diagnosis not present

## 2016-05-04 DIAGNOSIS — G20 Parkinson's disease: Secondary | ICD-10-CM | POA: Diagnosis not present

## 2016-05-04 DIAGNOSIS — I4891 Unspecified atrial fibrillation: Secondary | ICD-10-CM | POA: Diagnosis not present

## 2016-05-04 DIAGNOSIS — F0391 Unspecified dementia with behavioral disturbance: Secondary | ICD-10-CM | POA: Diagnosis not present

## 2016-05-04 DIAGNOSIS — M6281 Muscle weakness (generalized): Secondary | ICD-10-CM | POA: Diagnosis not present

## 2016-05-04 DIAGNOSIS — R2681 Unsteadiness on feet: Secondary | ICD-10-CM | POA: Diagnosis not present

## 2016-05-04 DIAGNOSIS — R1310 Dysphagia, unspecified: Secondary | ICD-10-CM | POA: Diagnosis not present

## 2016-05-04 DIAGNOSIS — Z9181 History of falling: Secondary | ICD-10-CM | POA: Diagnosis not present

## 2016-05-04 DIAGNOSIS — Z7901 Long term (current) use of anticoagulants: Secondary | ICD-10-CM | POA: Diagnosis not present

## 2016-05-04 DIAGNOSIS — R278 Other lack of coordination: Secondary | ICD-10-CM | POA: Diagnosis not present

## 2016-05-05 DIAGNOSIS — R278 Other lack of coordination: Secondary | ICD-10-CM | POA: Diagnosis not present

## 2016-05-05 DIAGNOSIS — M6281 Muscle weakness (generalized): Secondary | ICD-10-CM | POA: Diagnosis not present

## 2016-05-05 DIAGNOSIS — R2681 Unsteadiness on feet: Secondary | ICD-10-CM | POA: Diagnosis not present

## 2016-05-05 DIAGNOSIS — I4891 Unspecified atrial fibrillation: Secondary | ICD-10-CM | POA: Diagnosis not present

## 2016-05-05 DIAGNOSIS — G20 Parkinson's disease: Secondary | ICD-10-CM | POA: Diagnosis not present

## 2016-05-05 DIAGNOSIS — R1310 Dysphagia, unspecified: Secondary | ICD-10-CM | POA: Diagnosis not present

## 2016-05-05 DIAGNOSIS — Z9181 History of falling: Secondary | ICD-10-CM | POA: Diagnosis not present

## 2016-05-06 DIAGNOSIS — G20 Parkinson's disease: Secondary | ICD-10-CM | POA: Diagnosis not present

## 2016-05-06 DIAGNOSIS — R278 Other lack of coordination: Secondary | ICD-10-CM | POA: Diagnosis not present

## 2016-05-06 DIAGNOSIS — R2681 Unsteadiness on feet: Secondary | ICD-10-CM | POA: Diagnosis not present

## 2016-05-06 DIAGNOSIS — Z9181 History of falling: Secondary | ICD-10-CM | POA: Diagnosis not present

## 2016-05-06 DIAGNOSIS — I4891 Unspecified atrial fibrillation: Secondary | ICD-10-CM | POA: Diagnosis not present

## 2016-05-06 DIAGNOSIS — M6281 Muscle weakness (generalized): Secondary | ICD-10-CM | POA: Diagnosis not present

## 2016-05-06 DIAGNOSIS — R1310 Dysphagia, unspecified: Secondary | ICD-10-CM | POA: Diagnosis not present

## 2016-05-07 DIAGNOSIS — N390 Urinary tract infection, site not specified: Secondary | ICD-10-CM | POA: Diagnosis not present

## 2016-05-07 DIAGNOSIS — Z9181 History of falling: Secondary | ICD-10-CM | POA: Diagnosis not present

## 2016-05-07 DIAGNOSIS — Z7901 Long term (current) use of anticoagulants: Secondary | ICD-10-CM | POA: Diagnosis not present

## 2016-05-07 DIAGNOSIS — R1310 Dysphagia, unspecified: Secondary | ICD-10-CM | POA: Diagnosis not present

## 2016-05-07 DIAGNOSIS — G20 Parkinson's disease: Secondary | ICD-10-CM | POA: Diagnosis not present

## 2016-05-07 DIAGNOSIS — I4891 Unspecified atrial fibrillation: Secondary | ICD-10-CM | POA: Diagnosis not present

## 2016-05-07 DIAGNOSIS — M6281 Muscle weakness (generalized): Secondary | ICD-10-CM | POA: Diagnosis not present

## 2016-05-07 DIAGNOSIS — R2681 Unsteadiness on feet: Secondary | ICD-10-CM | POA: Diagnosis not present

## 2016-05-07 DIAGNOSIS — R278 Other lack of coordination: Secondary | ICD-10-CM | POA: Diagnosis not present

## 2016-05-08 DIAGNOSIS — R1310 Dysphagia, unspecified: Secondary | ICD-10-CM | POA: Diagnosis not present

## 2016-05-08 DIAGNOSIS — M6281 Muscle weakness (generalized): Secondary | ICD-10-CM | POA: Diagnosis not present

## 2016-05-08 DIAGNOSIS — R2681 Unsteadiness on feet: Secondary | ICD-10-CM | POA: Diagnosis not present

## 2016-05-08 DIAGNOSIS — Z9181 History of falling: Secondary | ICD-10-CM | POA: Diagnosis not present

## 2016-05-08 DIAGNOSIS — R278 Other lack of coordination: Secondary | ICD-10-CM | POA: Diagnosis not present

## 2016-05-08 DIAGNOSIS — G20 Parkinson's disease: Secondary | ICD-10-CM | POA: Diagnosis not present

## 2016-05-08 DIAGNOSIS — I4891 Unspecified atrial fibrillation: Secondary | ICD-10-CM | POA: Diagnosis not present

## 2016-05-08 DIAGNOSIS — A499 Bacterial infection, unspecified: Secondary | ICD-10-CM | POA: Diagnosis not present

## 2016-05-11 DIAGNOSIS — M6281 Muscle weakness (generalized): Secondary | ICD-10-CM | POA: Diagnosis not present

## 2016-05-11 DIAGNOSIS — R1310 Dysphagia, unspecified: Secondary | ICD-10-CM | POA: Diagnosis not present

## 2016-05-11 DIAGNOSIS — Z9181 History of falling: Secondary | ICD-10-CM | POA: Diagnosis not present

## 2016-05-11 DIAGNOSIS — R278 Other lack of coordination: Secondary | ICD-10-CM | POA: Diagnosis not present

## 2016-05-11 DIAGNOSIS — G20 Parkinson's disease: Secondary | ICD-10-CM | POA: Diagnosis not present

## 2016-05-11 DIAGNOSIS — I4891 Unspecified atrial fibrillation: Secondary | ICD-10-CM | POA: Diagnosis not present

## 2016-05-11 DIAGNOSIS — R2681 Unsteadiness on feet: Secondary | ICD-10-CM | POA: Diagnosis not present

## 2016-05-12 DIAGNOSIS — Z9181 History of falling: Secondary | ICD-10-CM | POA: Diagnosis not present

## 2016-05-12 DIAGNOSIS — I4891 Unspecified atrial fibrillation: Secondary | ICD-10-CM | POA: Diagnosis not present

## 2016-05-12 DIAGNOSIS — G20 Parkinson's disease: Secondary | ICD-10-CM | POA: Diagnosis not present

## 2016-05-12 DIAGNOSIS — M6281 Muscle weakness (generalized): Secondary | ICD-10-CM | POA: Diagnosis not present

## 2016-05-12 DIAGNOSIS — R278 Other lack of coordination: Secondary | ICD-10-CM | POA: Diagnosis not present

## 2016-05-12 DIAGNOSIS — R1310 Dysphagia, unspecified: Secondary | ICD-10-CM | POA: Diagnosis not present

## 2016-05-12 DIAGNOSIS — R2681 Unsteadiness on feet: Secondary | ICD-10-CM | POA: Diagnosis not present

## 2016-05-14 ENCOUNTER — Encounter: Payer: Self-pay | Admitting: Pharmacist

## 2016-05-14 DIAGNOSIS — G20 Parkinson's disease: Secondary | ICD-10-CM | POA: Diagnosis not present

## 2016-05-14 DIAGNOSIS — I4891 Unspecified atrial fibrillation: Secondary | ICD-10-CM | POA: Diagnosis not present

## 2016-05-21 DIAGNOSIS — I4891 Unspecified atrial fibrillation: Secondary | ICD-10-CM | POA: Diagnosis not present

## 2016-05-21 DIAGNOSIS — Z7901 Long term (current) use of anticoagulants: Secondary | ICD-10-CM | POA: Diagnosis not present

## 2016-05-28 DIAGNOSIS — I4891 Unspecified atrial fibrillation: Secondary | ICD-10-CM | POA: Diagnosis not present

## 2016-05-28 DIAGNOSIS — Z7901 Long term (current) use of anticoagulants: Secondary | ICD-10-CM | POA: Diagnosis not present

## 2016-05-28 DIAGNOSIS — K219 Gastro-esophageal reflux disease without esophagitis: Secondary | ICD-10-CM | POA: Diagnosis not present

## 2016-05-28 DIAGNOSIS — I10 Essential (primary) hypertension: Secondary | ICD-10-CM | POA: Diagnosis not present

## 2016-06-01 DIAGNOSIS — E46 Unspecified protein-calorie malnutrition: Secondary | ICD-10-CM | POA: Diagnosis not present

## 2016-06-01 DIAGNOSIS — Z7901 Long term (current) use of anticoagulants: Secondary | ICD-10-CM | POA: Diagnosis not present

## 2016-06-01 DIAGNOSIS — L89609 Pressure ulcer of unspecified heel, unspecified stage: Secondary | ICD-10-CM | POA: Diagnosis not present

## 2016-06-01 DIAGNOSIS — L89159 Pressure ulcer of sacral region, unspecified stage: Secondary | ICD-10-CM | POA: Diagnosis not present

## 2016-06-01 DIAGNOSIS — I4891 Unspecified atrial fibrillation: Secondary | ICD-10-CM | POA: Diagnosis not present

## 2016-06-02 DIAGNOSIS — L89620 Pressure ulcer of left heel, unstageable: Secondary | ICD-10-CM | POA: Diagnosis not present

## 2016-06-03 ENCOUNTER — Ambulatory Visit: Payer: Medicare Other | Admitting: Adult Health

## 2016-06-08 DIAGNOSIS — L89159 Pressure ulcer of sacral region, unspecified stage: Secondary | ICD-10-CM | POA: Diagnosis not present

## 2016-06-08 DIAGNOSIS — Z7901 Long term (current) use of anticoagulants: Secondary | ICD-10-CM | POA: Diagnosis not present

## 2016-06-08 DIAGNOSIS — L89609 Pressure ulcer of unspecified heel, unspecified stage: Secondary | ICD-10-CM | POA: Diagnosis not present

## 2016-06-08 DIAGNOSIS — I4891 Unspecified atrial fibrillation: Secondary | ICD-10-CM | POA: Diagnosis not present

## 2016-06-08 DIAGNOSIS — R1310 Dysphagia, unspecified: Secondary | ICD-10-CM | POA: Diagnosis not present

## 2016-06-16 DIAGNOSIS — L89153 Pressure ulcer of sacral region, stage 3: Secondary | ICD-10-CM | POA: Diagnosis not present

## 2016-06-16 DIAGNOSIS — L89620 Pressure ulcer of left heel, unstageable: Secondary | ICD-10-CM | POA: Diagnosis not present

## 2016-06-20 ENCOUNTER — Emergency Department (HOSPITAL_COMMUNITY)
Admission: EM | Admit: 2016-06-20 | Discharge: 2016-06-21 | Disposition: A | Discharge status: Home / Self Care | Payer: Medicare Other | Attending: Emergency Medicine | Admitting: Emergency Medicine

## 2016-06-20 ENCOUNTER — Emergency Department (HOSPITAL_COMMUNITY): Payer: Medicare Other

## 2016-06-20 ENCOUNTER — Encounter (HOSPITAL_COMMUNITY): Payer: Self-pay | Admitting: *Deleted

## 2016-06-20 DIAGNOSIS — Y999 Unspecified external cause status: Secondary | ICD-10-CM | POA: Insufficient documentation

## 2016-06-20 DIAGNOSIS — Z87891 Personal history of nicotine dependence: Secondary | ICD-10-CM | POA: Insufficient documentation

## 2016-06-20 DIAGNOSIS — Z8673 Personal history of transient ischemic attack (TIA), and cerebral infarction without residual deficits: Secondary | ICD-10-CM | POA: Insufficient documentation

## 2016-06-20 DIAGNOSIS — E119 Type 2 diabetes mellitus without complications: Secondary | ICD-10-CM | POA: Diagnosis not present

## 2016-06-20 DIAGNOSIS — W19XXXA Unspecified fall, initial encounter: Secondary | ICD-10-CM | POA: Insufficient documentation

## 2016-06-20 DIAGNOSIS — Y939 Activity, unspecified: Secondary | ICD-10-CM | POA: Diagnosis not present

## 2016-06-20 DIAGNOSIS — Z79899 Other long term (current) drug therapy: Secondary | ICD-10-CM | POA: Diagnosis not present

## 2016-06-20 DIAGNOSIS — S79912A Unspecified injury of left hip, initial encounter: Secondary | ICD-10-CM | POA: Diagnosis not present

## 2016-06-20 DIAGNOSIS — S0083XA Contusion of other part of head, initial encounter: Secondary | ICD-10-CM | POA: Insufficient documentation

## 2016-06-20 DIAGNOSIS — S79911A Unspecified injury of right hip, initial encounter: Secondary | ICD-10-CM | POA: Diagnosis not present

## 2016-06-20 DIAGNOSIS — M25552 Pain in left hip: Secondary | ICD-10-CM | POA: Diagnosis not present

## 2016-06-20 DIAGNOSIS — Y92129 Unspecified place in nursing home as the place of occurrence of the external cause: Secondary | ICD-10-CM | POA: Insufficient documentation

## 2016-06-20 DIAGNOSIS — S0093XA Contusion of unspecified part of head, initial encounter: Secondary | ICD-10-CM

## 2016-06-20 DIAGNOSIS — Z7901 Long term (current) use of anticoagulants: Secondary | ICD-10-CM | POA: Insufficient documentation

## 2016-06-20 DIAGNOSIS — I2510 Atherosclerotic heart disease of native coronary artery without angina pectoris: Secondary | ICD-10-CM | POA: Insufficient documentation

## 2016-06-20 DIAGNOSIS — I10 Essential (primary) hypertension: Secondary | ICD-10-CM | POA: Diagnosis not present

## 2016-06-20 DIAGNOSIS — R079 Chest pain, unspecified: Secondary | ICD-10-CM | POA: Diagnosis not present

## 2016-06-20 DIAGNOSIS — R279 Unspecified lack of coordination: Secondary | ICD-10-CM | POA: Diagnosis not present

## 2016-06-20 DIAGNOSIS — I252 Old myocardial infarction: Secondary | ICD-10-CM | POA: Diagnosis not present

## 2016-06-20 DIAGNOSIS — Z7401 Bed confinement status: Secondary | ICD-10-CM | POA: Diagnosis not present

## 2016-06-20 DIAGNOSIS — M25551 Pain in right hip: Secondary | ICD-10-CM | POA: Diagnosis not present

## 2016-06-20 DIAGNOSIS — S0990XA Unspecified injury of head, initial encounter: Secondary | ICD-10-CM | POA: Diagnosis present

## 2016-06-20 DIAGNOSIS — S59902A Unspecified injury of left elbow, initial encounter: Secondary | ICD-10-CM | POA: Diagnosis not present

## 2016-06-20 DIAGNOSIS — R42 Dizziness and giddiness: Secondary | ICD-10-CM | POA: Diagnosis not present

## 2016-06-20 DIAGNOSIS — F0390 Unspecified dementia without behavioral disturbance: Secondary | ICD-10-CM | POA: Diagnosis not present

## 2016-06-20 DIAGNOSIS — M25522 Pain in left elbow: Secondary | ICD-10-CM | POA: Diagnosis not present

## 2016-06-20 LAB — URINALYSIS, ROUTINE W REFLEX MICROSCOPIC
BILIRUBIN URINE: NEGATIVE
Glucose, UA: NEGATIVE mg/dL
Ketones, ur: NEGATIVE mg/dL
Leukocytes, UA: NEGATIVE
Nitrite: NEGATIVE
PH: 5.5 (ref 5.0–8.0)

## 2016-06-20 LAB — URINE MICROSCOPIC-ADD ON: WBC, UA: NONE SEEN WBC/hpf (ref 0–5)

## 2016-06-20 LAB — BASIC METABOLIC PANEL
Anion gap: 6 (ref 5–15)
BUN: 14 mg/dL (ref 6–20)
CALCIUM: 7.8 mg/dL — AB (ref 8.9–10.3)
CO2: 31 mmol/L (ref 22–32)
CREATININE: 0.59 mg/dL — AB (ref 0.61–1.24)
Chloride: 97 mmol/L — ABNORMAL LOW (ref 101–111)
GFR calc non Af Amer: >60 mL/min (ref >60)
Glucose, Bld: 120 mg/dL — ABNORMAL HIGH (ref 65–99)
Potassium: 3.9 mmol/L (ref 3.5–5.1)
SODIUM: 134 mmol/L — AB (ref 135–145)

## 2016-06-20 LAB — CBC WITH DIFFERENTIAL/PLATELET
BASOS PCT: 2 %
Basophils Absolute: 0.1 10*3/uL (ref 0.0–0.1)
EOS ABS: 0.4 10*3/uL (ref 0.0–0.7)
Eosinophils Relative: 8 %
HEMATOCRIT: 28.7 % — AB (ref 39.0–52.0)
HEMOGLOBIN: 9.7 g/dL — AB (ref 13.0–17.0)
LYMPHS ABS: 1.6 10*3/uL (ref 0.7–4.0)
Lymphocytes Relative: 28 %
MCH: 32.3 pg (ref 26.0–34.0)
MCHC: 33.8 g/dL (ref 30.0–36.0)
MCV: 95.7 fL (ref 78.0–100.0)
MONO ABS: 0.4 10*3/uL (ref 0.1–1.0)
MONOS PCT: 8 %
NEUTROS PCT: 54 %
Neutro Abs: 3.1 10*3/uL (ref 1.7–7.7)
Platelets: 247 10*3/uL (ref 150–400)
RBC: 3 MIL/uL — ABNORMAL LOW (ref 4.22–5.81)
RDW: 13.8 % (ref 11.5–15.5)
WBC: 5.7 10*3/uL (ref 4.0–10.5)

## 2016-06-20 LAB — PROTIME-INR
INR: 2.35
PROTHROMBIN TIME: 26.1 s — AB (ref 11.4–15.2)

## 2016-06-20 LAB — LACTIC ACID, PLASMA: Lactic Acid, Venous: 1.5 mmol/L (ref 0.5–1.9)

## 2016-06-20 LAB — TROPONIN I: Troponin I: <0.03 ng/mL (ref <0.03)

## 2016-06-20 MED ORDER — SODIUM CHLORIDE 0.9 % IV BOLUS (SEPSIS)
250.0000 mL | Freq: Once | INTRAVENOUS | Status: AC
  Administered 2016-06-20: 250 mL via INTRAVENOUS

## 2016-06-20 MED ORDER — SODIUM CHLORIDE 0.9 % IV SOLN: INTRAVENOUS | Status: DC

## 2016-06-20 NOTE — ED Notes (Signed)
NS restarted after request by CT that pt be DCd from IV for CT. I&O cath performed and specimen to the lab- pt tolerated well

## 2016-06-20 NOTE — ED Notes (Signed)
Per April, LPN at [facility], pt fell tonight more or less one hour ago. He slid out of bed and fell 8-12 inches hitting his head and perhaps tearing his elbow. 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 She states pt is confused at baseline and last BP was 7/4 of 118/56. His last recorded weight is 124 lb from 7/5. He has a protective boot on his R foot also per April, LPN.

## 2016-06-20 NOTE — ED Notes (Signed)
Call to [facility] for transport

## 2016-06-20 NOTE — ED Provider Notes (Signed)
[facility] DEPT Provider Note   CSN: [id number] Arrival date & time: 06/20/16  4098  First Provider Contact:  None       History   Chief Complaint Chief Complaint  Patient presents with  . Fall    HPI [name] is a 82 y.o. male.  The history is provided by the nursing home and the EMS personnel. The history is limited by the condition of the patient (Hx dementia).  Fall   Pt was seen at 2000. Per EMS and NH report: Pt was found on the floor PTA. Pt has significant hx of dementia and currently denies any complaints.   Past Medical History:  Diagnosis Date  . Anxiety   . Atrial fibrillation, persistent (HCC)   . BPH (benign prostatic hyperplasia)   . CAD (coronary artery disease)    Anterior MI and DES stenting 2004, Vfib arrest; left main 25% stenosis, LAD 80% stenosis, 99% stenosis, 25% circumflex stenosis, 25% ramus intermedius stenosis, 50-40% right coronary artery stenosis, 90% RV branch stenosis with right to left collaterals; EF 55% with mild naterior hypokinesis (underwent Taxus stenting at the time) . Stress perfusion study January 2012 EF 48% with old apical infarct  . Cataract   . Diabetes mellitus   . Diverticulosis   . Dyslipidemia   . Essential and other specified forms of tremor 05/05/2013  . GERD (gastroesophageal reflux disease)   . GIB (gastrointestinal bleeding)   . Hearing deficit    Wears hearing aids  . Hemorrhoids   . Hyperlipidemia   . Hyperplastic colon polyp   . Hypertension   . Memory deficits 05/05/2013  . Myocardial infarction (HCC)   . Obesity   . Stroke (HCC)   . Tremor   . Tremor, essential 12/11/2015  . UC (ulcerative colitis confined to rectum) [facility])    Left-sided    Patient Active Problem List   Diagnosis Date Noted  . Fall 01/10/2016  . Hyponatremia 01/10/2016  . Tremor, essential 12/11/2015  . Altered mental status 11/30/2015  . Type 2 diabetes, diet controlled (HCC) 06/28/2015  . Cerebrovascular disease 12/05/2014   . ASCVD (arteriosclerotic cardiovascular disease) 12/05/2014  . Persistent atrial fibrillation (HCC) 12/05/2014  . High risk medication use 06/22/2013  . Hypokalemia 06/22/2013  . Memory deficits 05/05/2013  . Essential tremor 05/05/2013  . Cerebrovascular disease, unspecified 01/16/2013  . CAD (coronary artery disease)   . BPH (benign prostatic hypertrophy) 02/11/2011  . Premature atrial complexes 02/11/2011  . Panic attack 02/11/2011  . Hyperlipidemia 02/12/2010  . Atrial fibrillation, persistent (HCC) 10/30/2009  . GERD 03/12/2009  . ULCERATIVE COLITIS-LEFT SIDE 03/12/2009    Past Surgical History:  Procedure Laterality Date  . CATARACT EXTRACTION W/PHACO  10/27/2012   Procedure: CATARACT EXTRACTION PHACO AND INTRAOCULAR LENS PLACEMENT (IOC);  Surgeon: Gemma Payor, MD;  Location: [facility];  Service: Ophthalmology;  Laterality: Left;  CDE 22.67  . CORONARY ANGIOPLASTY WITH STENT PLACEMENT     Taxus stenting  . HAMMER TOE SURGERY     right  . INGUINAL HERNIA REPAIR     right, bilateral       Home Medications    Prior to Admission medications   Medication Sig Start Date End Date Taking? Authorizing Provider  atorvastatin (LIPITOR) 40 MG tablet TAKE ONE TABLET AT BEDTIME 10/04/15   [name], MD  Cholecalciferol (VITAMIN D) 1000 UNITS capsule Take 1 capsule (1,000 Units total) by mouth daily. 06/26/13   Henrene Pastor, PharmD  digoxin (LANOXIN) 0.125  MG tablet TAKE 1 TABLET IN THE MORNING 10/08/15   [name], MD  hydrOXYzine (ATARAX/VISTARIL) 10 MG tablet Take one-half tablet daily as needed Patient not taking: Reported on 01/10/2016 01/03/16   [name], MD  metoprolol (LOPRESSOR) 100 MG tablet TAKE 1 & 1/2 TABLETS TWICE A DAY 01/06/16   [name], MD  mirabegron ER (MYRBETRIQ) 25 MG TB24 tablet Take 1 tablet (25 mg total) by mouth daily. 01/16/16   [name], MD  Multiple Vitamin (MULTIVITAMIN WITH MINERALS) TABS tablet Take 1 tablet by mouth daily. 08/28/15    [name], MD  nitroGLYCERIN (NITROSTAT) 0.4 MG SL tablet Place 1 tablet (0.4 mg total) under the tongue every 5 (five) minutes as needed for chest pain. 10/08/14   [name], MD  PARoxetine (PAXIL) 30 MG tablet Take 1 tablet (30 mg total) by mouth every morning. 01/16/16   [name], MD  sulfaSALAzine (AZULFIDINE) 500 MG EC tablet Take 1 tablet (500 mg total) by mouth 2 (two) times daily. Take 500 mg twice a day by mouth for 1 week, then 1000 mg BID Patient taking differently: Take 500 mg by mouth every morning.  11/02/15   [name], MD  vitamin B-12 (CYANOCOBALAMIN) 1000 MCG tablet TAKE 1 TABLET IN THE MORNING 10/08/15   [name], MD  warfarin (COUMADIN) 5 MG tablet Takes as directed Patient taking differently: take one tablet on Tuesdays and Thursdays, then take one-half tablet on all other days 11/01/15   [name], MD    Family History Family History  Problem Relation Age of Onset  . Alzheimer's disease Brother   . Diabetes Brother     x 4  . Diabetes Mother     ?  . Diabetes Father   . Heart disease Father   . Heart disease Brother     x 3  . COPD Sister   . Diabetes Sister   . Heart disease Paternal Grandfather   . Colon cancer Neg Hx     Social History Social History  Substance Use Topics  . Smoking status: Former Smoker    Quit date: 11/23/1958  . Smokeless tobacco: Former Neurosurgeon    Types: Chew     Comment: Quit years ago  . Alcohol use No     Allergies   Asa-apap-salicyl-caff; Codeine; Nsaids; and Sulfonamide derivatives   Review of Systems Review of Systems  Unable to perform ROS: Dementia     Physical Exam Updated Vital Signs BP 110/68   Pulse 101   Temp 98 F (36.7 C) (Oral)   Resp 18   Ht 5\' 9"  (1.753 m)   Wt 169 lb (76.7 kg)   SpO2 96%   BMI 24.96 kg/m     Physical Exam 2005; Physical examination:  Nursing notes reviewed; Vital signs and O2 SAT reviewed;  Constitutional: Well developed, Well nourished, Well  hydrated, In no acute distress; Head:  Normocephalic, +contusion right forehead.; Eyes: EOMI, PERRL, No scleral icterus; ENMT: Mouth and pharynx normal, Mucous membranes moist; Neck: Supple, Full range of motion, No lymphadenopathy; Cardiovascular: Regular rate and rhythm, No gallop; Respiratory: Breath sounds clear & equal bilaterally, No wheezes.  Speaking sentences with ease, Normal respiratory effort/excursion; Chest: Nontender, Movement normal; Abdomen: Soft, Nontender, Nondistended, Normal bowel sounds; Genitourinary: No CVA tenderness; Extremities: Pulses normal, No deformity. Pelvis stable. +ecchymosis in various stages to right elbow, left dorsal hand and left elbow (also has several small skin  tears). No tenderness, +1 pedal edema bilat..; Neuro: Awake, alert, confused per hx dementia. No facial droop. Speech clear. Grips equal. Moves all extremities spontaneously and to command without apparent gross focal motor deficit..; Skin: Color normal, Warm, Dry.   ED Treatments / Results  Labs (all labs ordered are listed, but only abnormal results are displayed)   EKG  EKG Interpretation None       Radiology   Procedures Procedures (including critical care time)  Medications Ordered in ED Medications  sodium chloride 0.9 % bolus 250 mL (not administered)  0.9 %  sodium chloride infusion (not administered)     Initial Impression / Assessment and Plan / ED Course  I have reviewed the triage vital signs and the nursing notes.  Pertinent labs & imaging results that were available during my care of the patient were reviewed by me and considered in my medical decision making (see chart for details).  MDM Reviewed: previous chart, nursing note and vitals Reviewed previous: labs and ECG Interpretation: labs, ECG, x-ray and CT scan   ED ECG REPORT   Date: 06/20/2016  Rate: 92  Rhythm: normal sinus rhythm  QRS Axis: normal  Intervals: normal  ST/T Wave abnormalities:  nonspecific ST/T changes, artifact  Conduction Disutrbances:none  Narrative Interpretation:   Old EKG Reviewed: changes noted; NSR has replaced afib since previous EKG dated 11/30/2015.   Results for orders placed or performed during the hospital encounter of 06/20/16  Protime-INR  Result Value Ref Range   Prothrombin Time 26.1 (H) 11.4 - 15.2 seconds   INR 2.35   CBC with Differential  Result Value Ref Range   WBC 5.7 4.0 - 10.5 K/uL   RBC 3.00 (L) 4.22 - 5.81 MIL/uL   Hemoglobin 9.7 (L) 13.0 - 17.0 g/dL   HCT 16.1 (L) 09.6 - 04.5 %   MCV 95.7 78.0 - 100.0 fL   MCH 32.3 26.0 - 34.0 pg   MCHC 33.8 30.0 - 36.0 g/dL   RDW 40.9 81.1 - 91.4 %   Platelets 247 150 - 400 K/uL   Neutrophils Relative % 54 %   Neutro Abs 3.1 1.7 - 7.7 K/uL   Lymphocytes Relative 28 %   Lymphs Abs 1.6 0.7 - 4.0 K/uL   Monocytes Relative 8 %   Monocytes Absolute 0.4 0.1 - 1.0 K/uL   Eosinophils Relative 8 %   Eosinophils Absolute 0.4 0.0 - 0.7 K/uL   Basophils Relative 2 %   Basophils Absolute 0.1 0.0 - 0.1 K/uL  Basic metabolic panel  Result Value Ref Range   Sodium 134 (L) 135 - 145 mmol/L   Potassium 3.9 3.5 - 5.1 mmol/L   Chloride 97 (L) 101 - 111 mmol/L   CO2 31 22 - 32 mmol/L   Glucose, Bld 120 (H) 65 - 99 mg/dL   BUN 14 6 - 20 mg/dL   Creatinine, Ser 7.82 (L) 0.61 - 1.24 mg/dL   Calcium 7.8 (L) 8.9 - 10.3 mg/dL   GFR calc non Af Amer >60 >60 mL/min   GFR calc Af Amer >60 >60 mL/min   Anion gap 6 5 - 15  Lactic acid, plasma  Result Value Ref Range   Lactic Acid, Venous 1.5 0.5 - 1.9 mmol/L  Troponin I  Result Value Ref Range   Troponin I <0.03 <0.03 ng/mL     2210:  H/H lower than previous; no outward signs of bleeding. SBP's 90's; judicious IVF given (EPIC chart reviewed: SBP usually 110's). Urine,  XR and CT pending. Dispo based on results. Sign out to Dr. Juleen China.         Final Clinical Impressions(s) / ED Diagnoses   Final diagnoses:  None    New Prescriptions New  Prescriptions   No medications on file     Samuel Jester, DO 06/20/16 2218

## 2016-06-20 NOTE — ED Notes (Signed)
Report [name], LPN

## 2016-06-20 NOTE — ED Notes (Signed)
From CT 

## 2016-06-20 NOTE — ED Notes (Signed)
Delay in transport to [facility]- EMS [place] and will pick up upon return

## 2016-06-20 NOTE — ED Notes (Signed)
To CT

## 2016-06-20 NOTE — ED Notes (Signed)
Continues in CT 

## 2016-06-20 NOTE — ED Triage Notes (Addendum)
Pt is a resident of [facility] who was found in the floor, pt has hematoma noted to right forehead area. Pt arrived to er able to answer questions, pt also has abrasion noted to left elbow

## 2016-06-21 DIAGNOSIS — Z7401 Bed confinement status: Secondary | ICD-10-CM | POA: Diagnosis not present

## 2016-06-21 DIAGNOSIS — R279 Unspecified lack of coordination: Secondary | ICD-10-CM | POA: Diagnosis not present

## 2016-06-22 DIAGNOSIS — I4891 Unspecified atrial fibrillation: Secondary | ICD-10-CM | POA: Diagnosis not present

## 2016-06-22 DIAGNOSIS — Z7901 Long term (current) use of anticoagulants: Secondary | ICD-10-CM | POA: Diagnosis not present

## 2016-06-23 DIAGNOSIS — L89620 Pressure ulcer of left heel, unstageable: Secondary | ICD-10-CM | POA: Diagnosis not present

## 2016-06-23 DIAGNOSIS — L89153 Pressure ulcer of sacral region, stage 3: Secondary | ICD-10-CM | POA: Diagnosis not present

## 2016-06-23 LAB — URINE CULTURE: Culture: NO GROWTH

## 2016-06-30 DIAGNOSIS — L89890 Pressure ulcer of other site, unstageable: Secondary | ICD-10-CM | POA: Diagnosis not present

## 2016-06-30 DIAGNOSIS — Z7901 Long term (current) use of anticoagulants: Secondary | ICD-10-CM | POA: Diagnosis not present

## 2016-06-30 DIAGNOSIS — L8990 Pressure ulcer of unspecified site, unspecified stage: Secondary | ICD-10-CM | POA: Diagnosis not present

## 2016-06-30 DIAGNOSIS — Z5181 Encounter for therapeutic drug level monitoring: Secondary | ICD-10-CM | POA: Diagnosis not present

## 2016-06-30 DIAGNOSIS — L89153 Pressure ulcer of sacral region, stage 3: Secondary | ICD-10-CM | POA: Diagnosis not present

## 2016-06-30 DIAGNOSIS — L89620 Pressure ulcer of left heel, unstageable: Secondary | ICD-10-CM | POA: Diagnosis not present

## 2016-07-01 DIAGNOSIS — Z9181 History of falling: Secondary | ICD-10-CM | POA: Diagnosis not present

## 2016-07-01 DIAGNOSIS — S59912A Unspecified injury of left forearm, initial encounter: Secondary | ICD-10-CM | POA: Diagnosis not present

## 2016-07-01 DIAGNOSIS — S6992XA Unspecified injury of left wrist, hand and finger(s), initial encounter: Secondary | ICD-10-CM | POA: Diagnosis not present

## 2016-07-04 IMAGING — CT CT HEAD W/O CM
1 of 2 series · 15 of 30 positions shown, 19 images · non-contrast
Comparison: 11/30/2015

CLINICAL DATA: Status post fall this morning.

EXAM:
CT HEAD WITHOUT CONTRAST
TECHNIQUE: Contiguous axial images were obtained from the base of the skull
through the vertex without intravenous contrast.

[Series 3: headtrauma 2.4 h60s · axial · 0.47mm/px · z∈[+132,+287]mm · 15 of 72 slices shown, 19 images]
[im 4/72  brain]
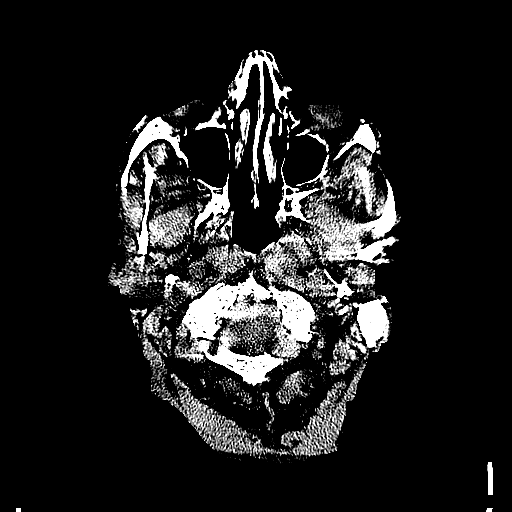
[im 4/72  bone]
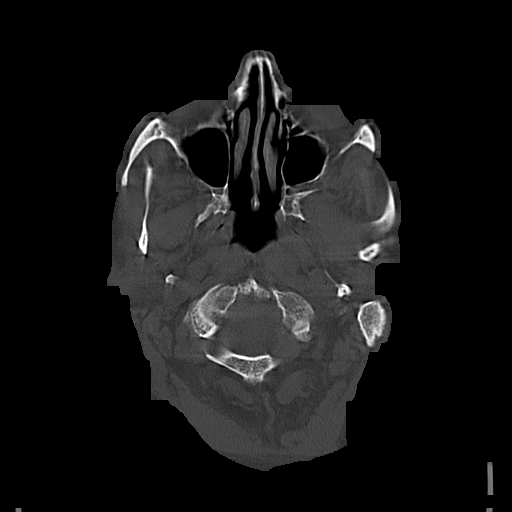
[im 8/72  brain]
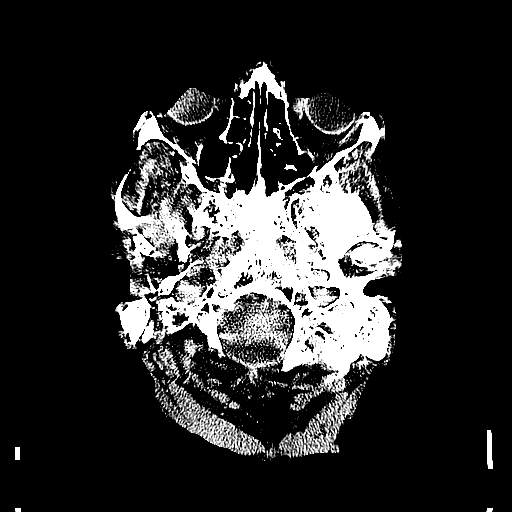
[im 15/72  brain]
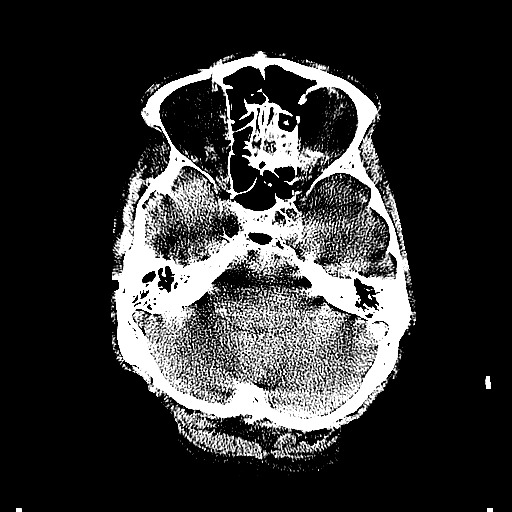
[im 19/72  brain]
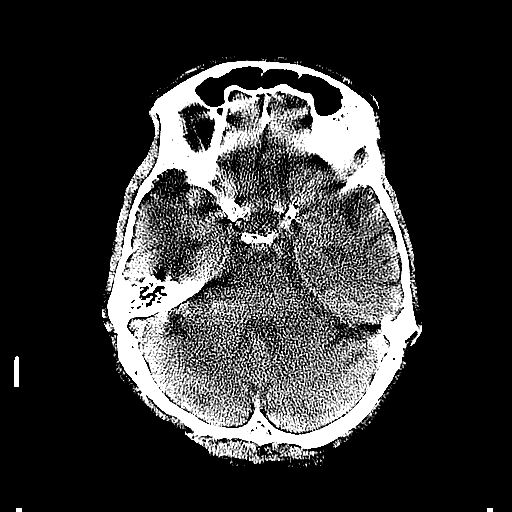
[im 23/72  brain]
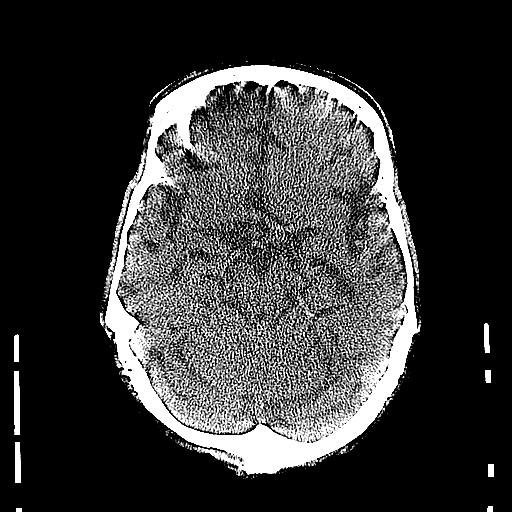
[im 23/72  bone]
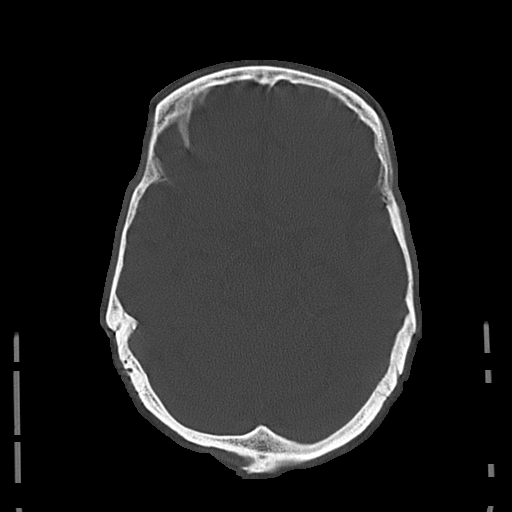
[im 27/72  brain]
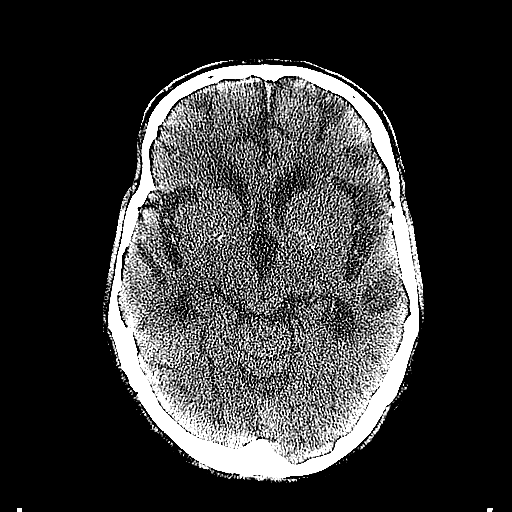
[im 30/72  brain]
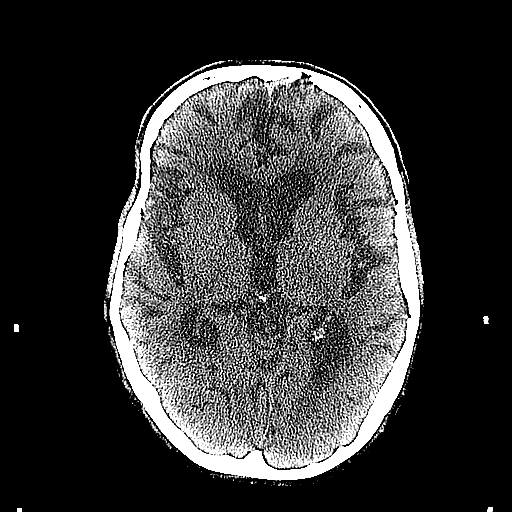
[im 38/72  brain]
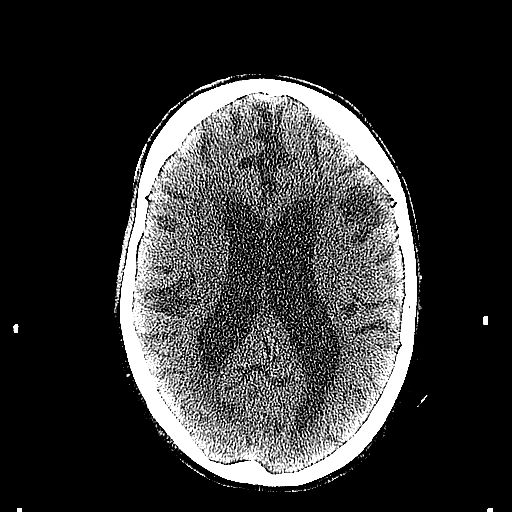
[im 42/72  brain]
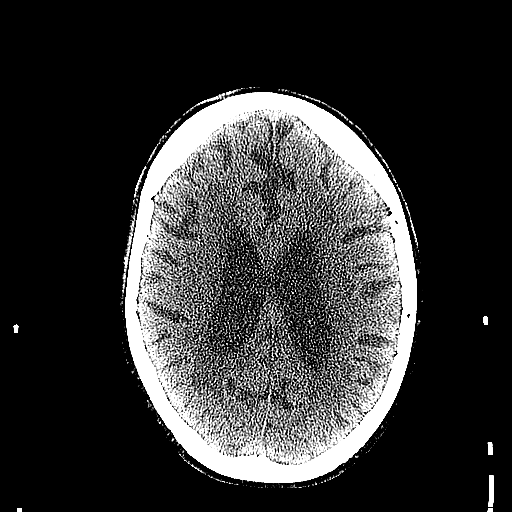
[im 42/72  bone]
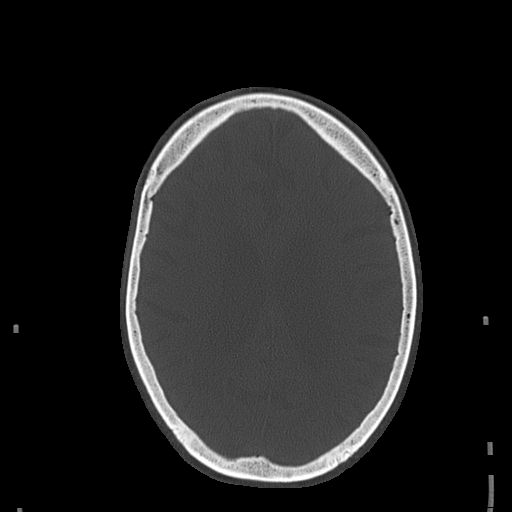
[im 45/72  brain]
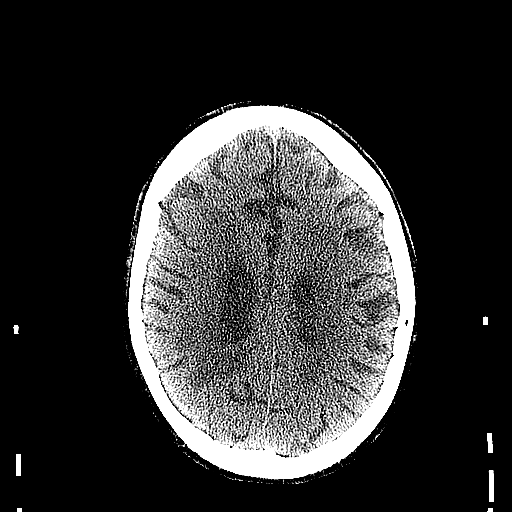
[im 49/72  brain]
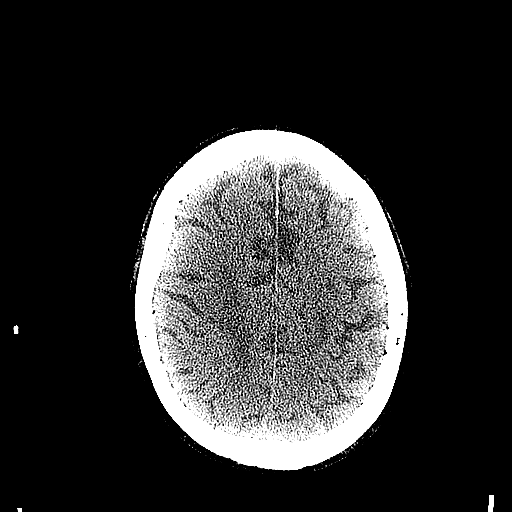
[im 53/72  brain]
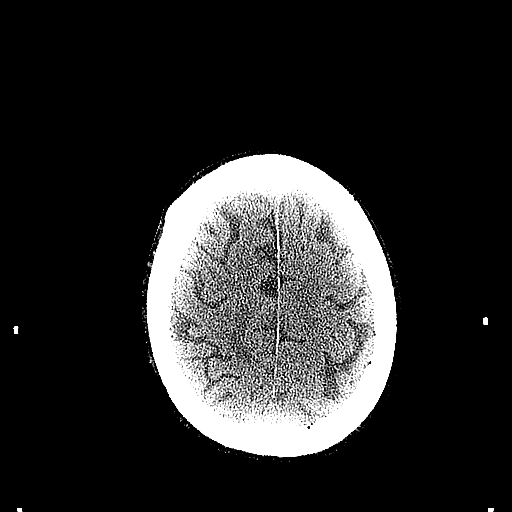
[im 60/72  brain]
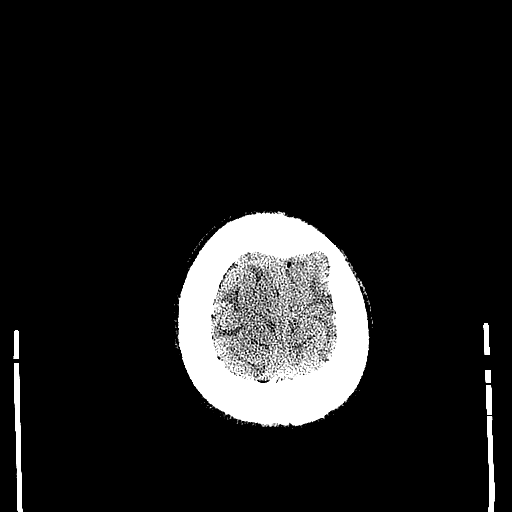
[im 60/72  bone]
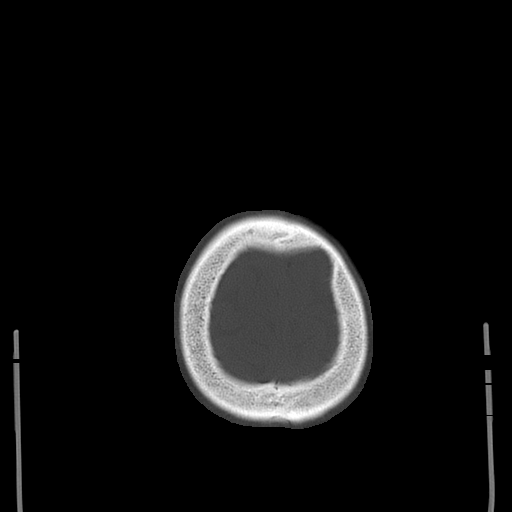
[im 64/72  brain]
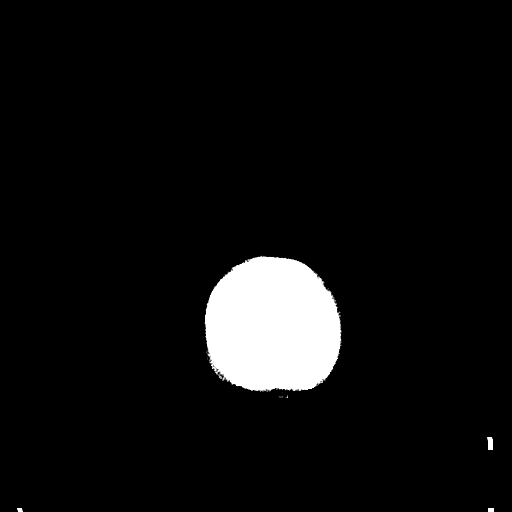
[im 68/72  brain]
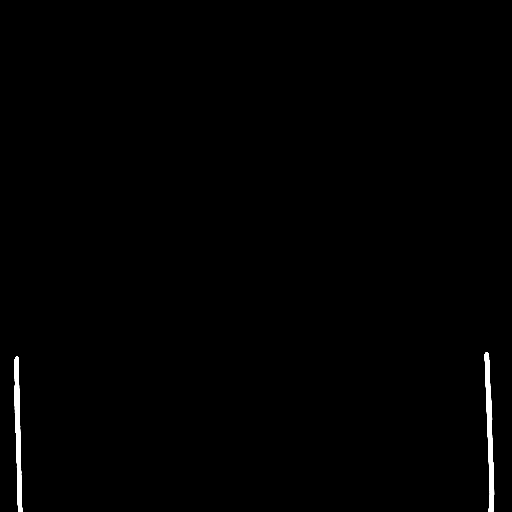

[15 of 30 positions shown; findings below may reference images not displayed]

FINDINGS: There is no evidence of mass effect, midline shift, or extra-axial
fluid collections. There is no evidence of a space-occupying lesion
or intracranial hemorrhage. There is no evidence of a cortical-based
area of acute infarction. There is generalized cerebral atrophy.
There is periventricular white matter low attenuation likely
secondary to microangiopathy.

The ventricles and sulci are appropriate for the patient's age. The
basal cisterns are patent.

Visualized portions of the orbits are unremarkable. The visualized
portions of the paranasal sinuses and mastoid air cells are
unremarkable. Cerebrovascular atherosclerotic calcifications are
noted.

The osseous structures are unremarkable.
IMPRESSION: 1. No acute intracranial pathology.
2. Chronic microvascular disease and cerebral atrophy.

## 2016-07-04 IMAGING — DX DG KNEE COMPLETE 4+V*L*
4 series · 4 of 4 positions shown · non-contrast
Comparison: None.

CLINICAL DATA: Fell this morning with left knee pain.

EXAM:
LEFT KNEE - COMPLETE 4+ VIEW

[knee ap]
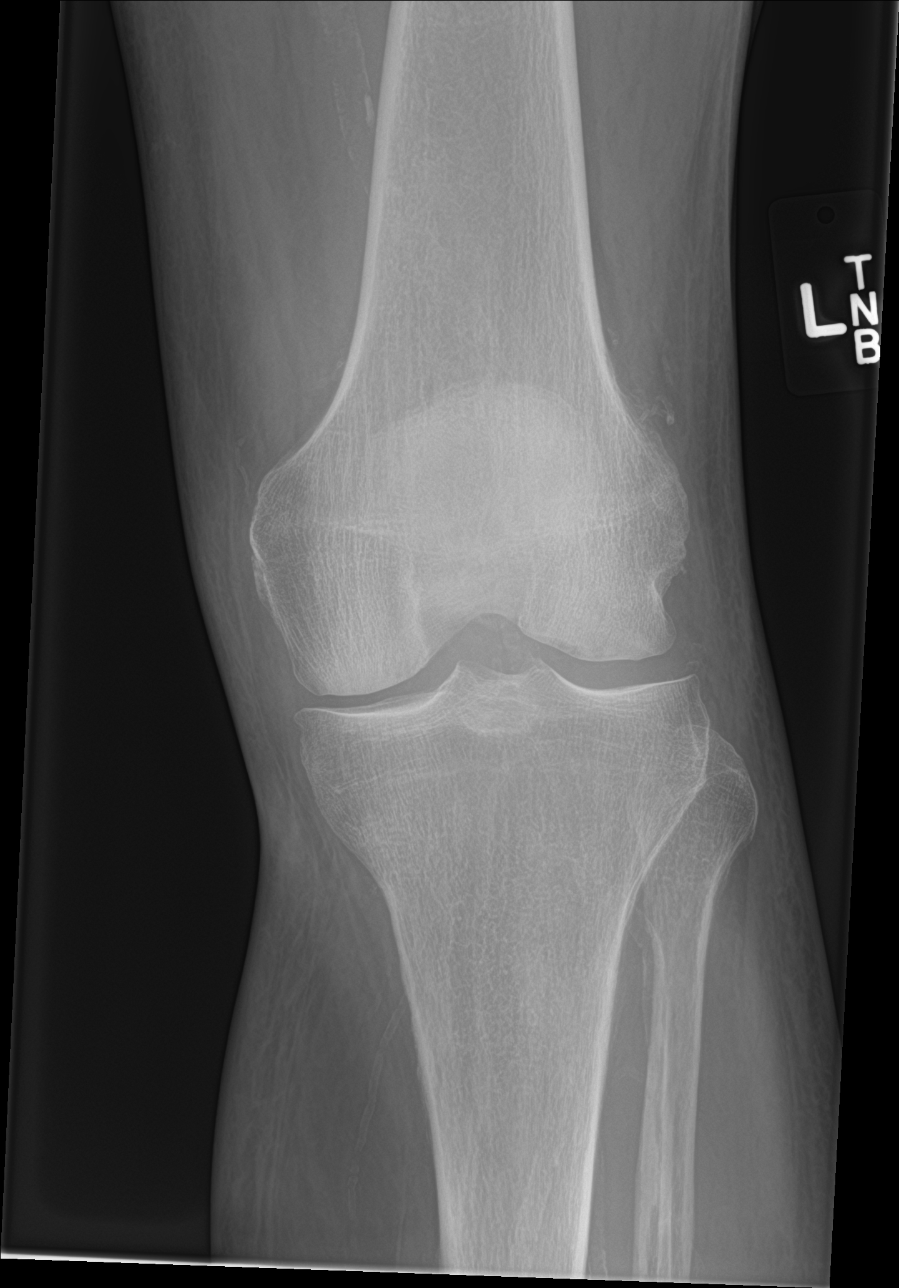

[tunnel]
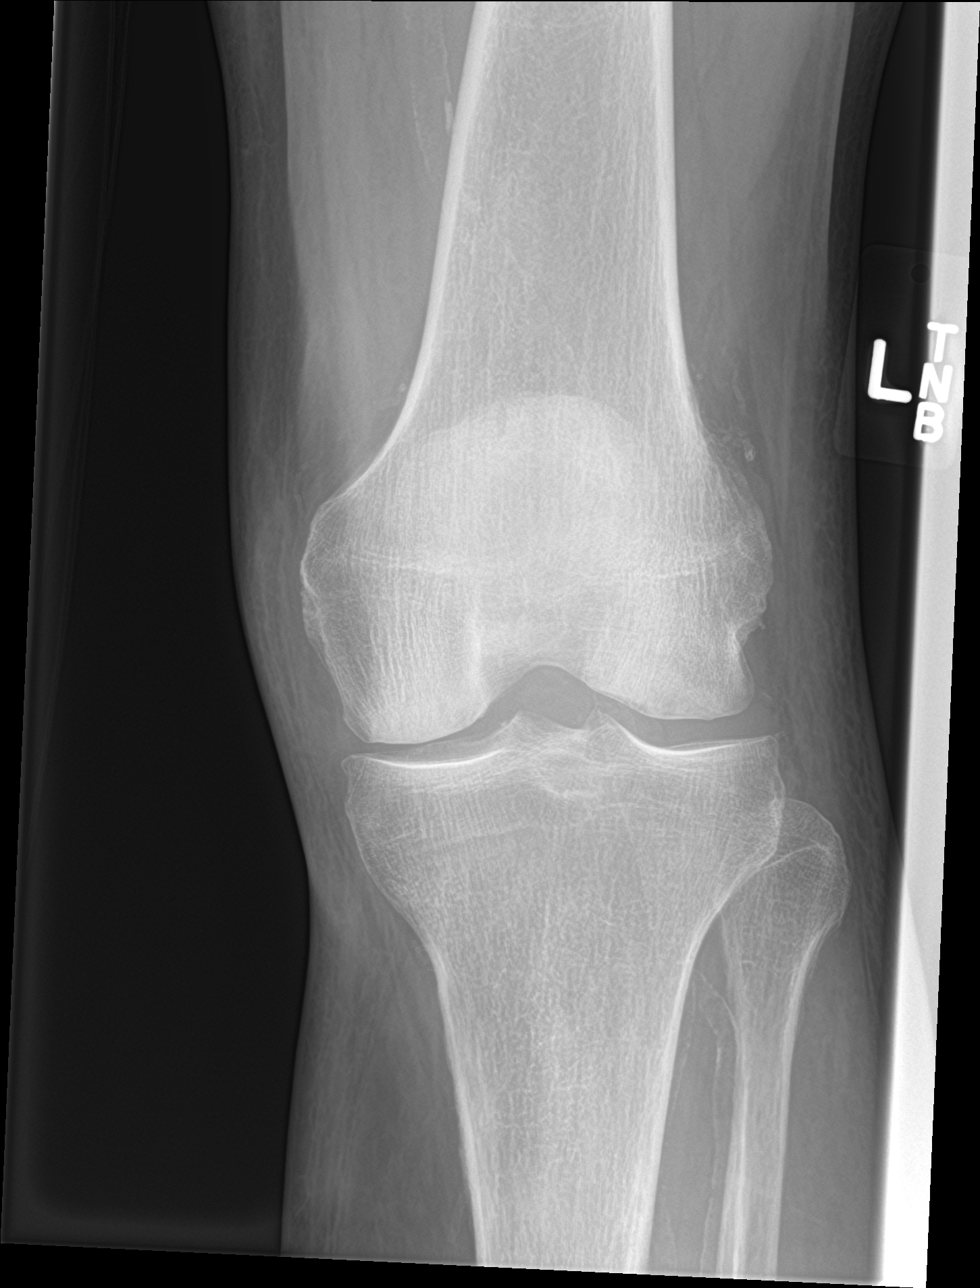

[knee lat]
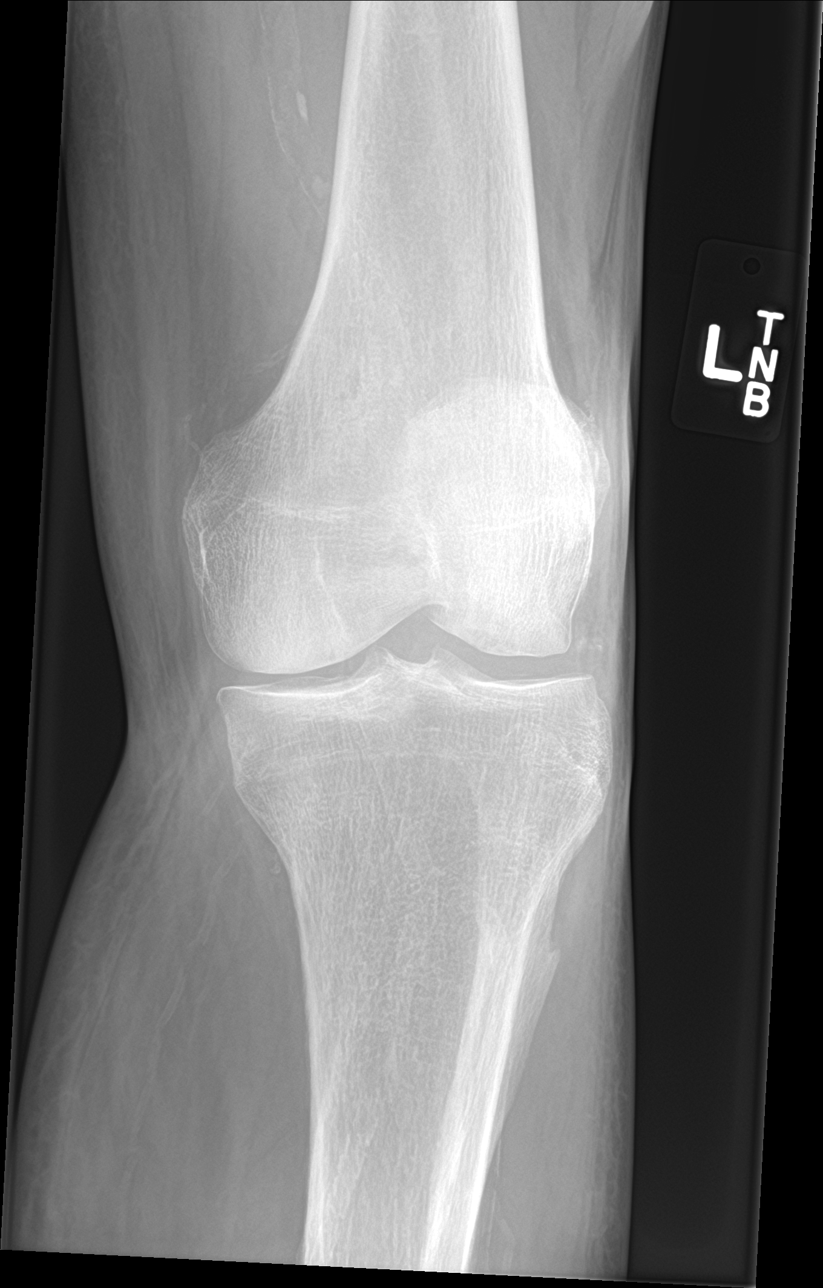

[knee sunrise]
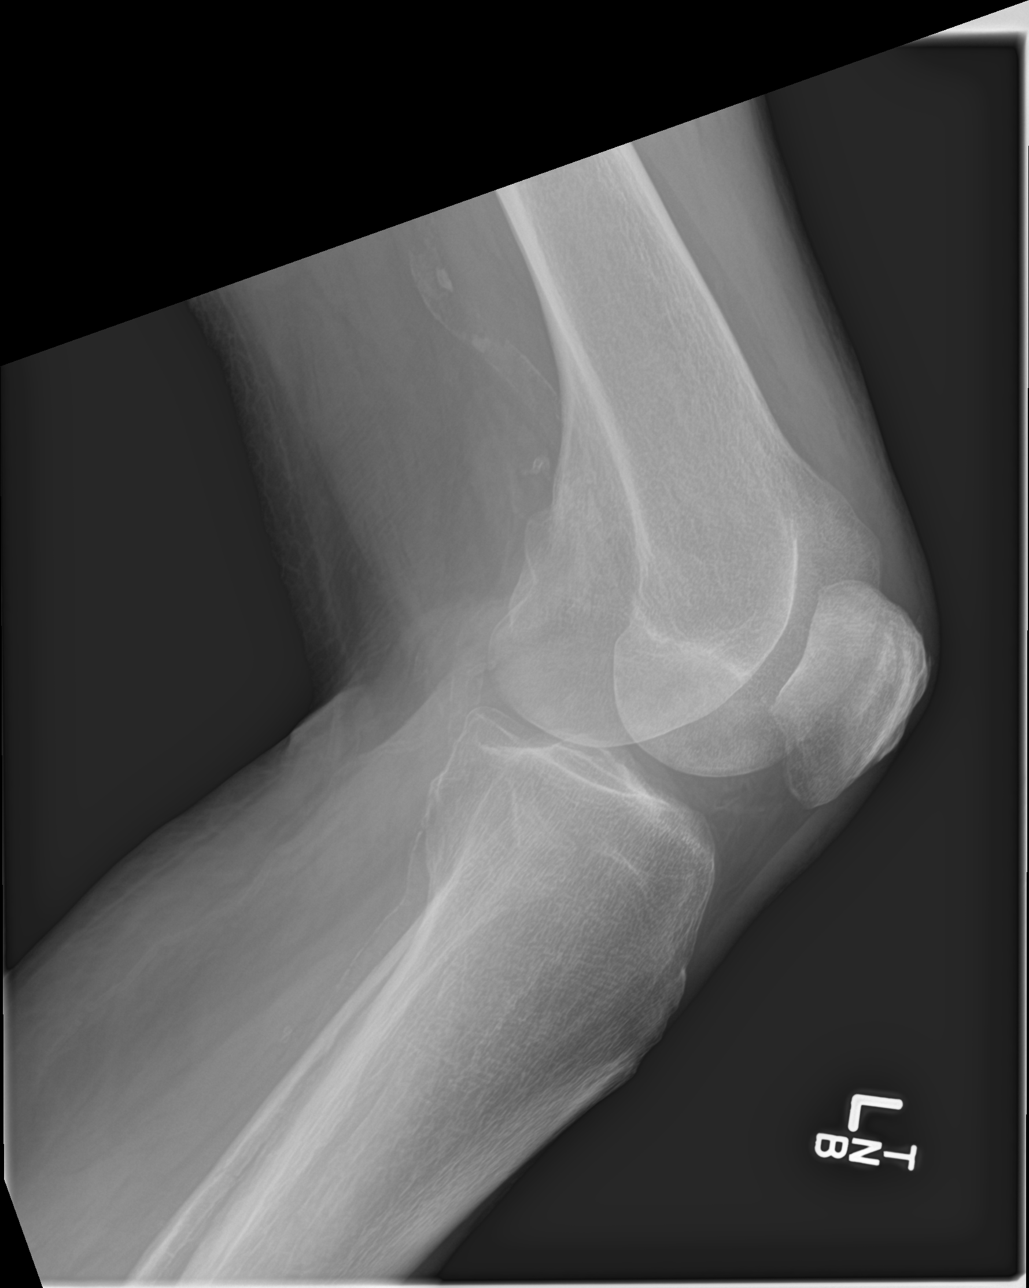

[4 of 4 positions shown; findings below may reference images not displayed]

FINDINGS: Mild medial joint space narrowing. The knee is located without
definite fracture. There is a small cortical step-off involving the
proximal fibula which is probably chronic. No significant joint
effusion. There are vascular calcifications.
IMPRESSION: Mild osteoarthritic changes without acute bone abnormality.

Mild cortical irregularity involving the proximal fibula is probably
chronic. Recommend clinical correlation in this area.

## 2016-07-06 DIAGNOSIS — I4891 Unspecified atrial fibrillation: Secondary | ICD-10-CM | POA: Diagnosis not present

## 2016-07-06 DIAGNOSIS — Z7901 Long term (current) use of anticoagulants: Secondary | ICD-10-CM | POA: Diagnosis not present

## 2016-07-07 DIAGNOSIS — L89890 Pressure ulcer of other site, unstageable: Secondary | ICD-10-CM | POA: Diagnosis not present

## 2016-07-07 DIAGNOSIS — L89153 Pressure ulcer of sacral region, stage 3: Secondary | ICD-10-CM | POA: Diagnosis not present

## 2016-07-07 DIAGNOSIS — L89620 Pressure ulcer of left heel, unstageable: Secondary | ICD-10-CM | POA: Diagnosis not present

## 2016-07-09 ENCOUNTER — Ambulatory Visit: Payer: Self-pay | Admitting: Pharmacist

## 2016-07-10 DIAGNOSIS — R4182 Altered mental status, unspecified: Secondary | ICD-10-CM | POA: Diagnosis not present

## 2016-07-13 DIAGNOSIS — I4891 Unspecified atrial fibrillation: Secondary | ICD-10-CM | POA: Diagnosis not present

## 2016-07-14 DIAGNOSIS — L89153 Pressure ulcer of sacral region, stage 3: Secondary | ICD-10-CM | POA: Diagnosis not present

## 2016-07-14 DIAGNOSIS — L89620 Pressure ulcer of left heel, unstageable: Secondary | ICD-10-CM | POA: Diagnosis not present

## 2016-07-20 DIAGNOSIS — Z7901 Long term (current) use of anticoagulants: Secondary | ICD-10-CM | POA: Diagnosis not present

## 2016-07-20 DIAGNOSIS — I4891 Unspecified atrial fibrillation: Secondary | ICD-10-CM | POA: Diagnosis not present

## 2016-07-28 DIAGNOSIS — L89153 Pressure ulcer of sacral region, stage 3: Secondary | ICD-10-CM | POA: Diagnosis not present

## 2016-07-28 DIAGNOSIS — L89620 Pressure ulcer of left heel, unstageable: Secondary | ICD-10-CM | POA: Diagnosis not present

## 2016-07-28 DIAGNOSIS — L89890 Pressure ulcer of other site, unstageable: Secondary | ICD-10-CM | POA: Diagnosis not present

## 2016-08-03 DIAGNOSIS — E46 Unspecified protein-calorie malnutrition: Secondary | ICD-10-CM | POA: Diagnosis not present

## 2016-08-03 DIAGNOSIS — R1310 Dysphagia, unspecified: Secondary | ICD-10-CM | POA: Diagnosis not present

## 2016-08-03 DIAGNOSIS — L89159 Pressure ulcer of sacral region, unspecified stage: Secondary | ICD-10-CM | POA: Diagnosis not present

## 2016-08-03 DIAGNOSIS — Z7901 Long term (current) use of anticoagulants: Secondary | ICD-10-CM | POA: Diagnosis not present

## 2016-08-03 DIAGNOSIS — I4891 Unspecified atrial fibrillation: Secondary | ICD-10-CM | POA: Diagnosis not present

## 2016-08-03 DIAGNOSIS — K219 Gastro-esophageal reflux disease without esophagitis: Secondary | ICD-10-CM | POA: Diagnosis not present

## 2016-08-04 DIAGNOSIS — L89623 Pressure ulcer of left heel, stage 3: Secondary | ICD-10-CM | POA: Diagnosis not present

## 2016-08-04 DIAGNOSIS — L89153 Pressure ulcer of sacral region, stage 3: Secondary | ICD-10-CM | POA: Diagnosis not present

## 2016-08-04 DIAGNOSIS — L89890 Pressure ulcer of other site, unstageable: Secondary | ICD-10-CM | POA: Diagnosis not present

## 2016-08-06 DIAGNOSIS — L89159 Pressure ulcer of sacral region, unspecified stage: Secondary | ICD-10-CM | POA: Diagnosis not present

## 2016-08-06 DIAGNOSIS — I4891 Unspecified atrial fibrillation: Secondary | ICD-10-CM | POA: Diagnosis not present

## 2016-08-06 DIAGNOSIS — Z7901 Long term (current) use of anticoagulants: Secondary | ICD-10-CM | POA: Diagnosis not present

## 2016-08-06 DIAGNOSIS — K219 Gastro-esophageal reflux disease without esophagitis: Secondary | ICD-10-CM | POA: Diagnosis not present

## 2016-08-11 DIAGNOSIS — L84 Corns and callosities: Secondary | ICD-10-CM | POA: Diagnosis not present

## 2016-08-11 DIAGNOSIS — L89890 Pressure ulcer of other site, unstageable: Secondary | ICD-10-CM | POA: Diagnosis not present

## 2016-08-11 DIAGNOSIS — L89624 Pressure ulcer of left heel, stage 4: Secondary | ICD-10-CM | POA: Diagnosis not present

## 2016-08-11 DIAGNOSIS — L89153 Pressure ulcer of sacral region, stage 3: Secondary | ICD-10-CM | POA: Diagnosis not present

## 2016-08-11 DIAGNOSIS — E1151 Type 2 diabetes mellitus with diabetic peripheral angiopathy without gangrene: Secondary | ICD-10-CM | POA: Diagnosis not present

## 2016-08-13 DIAGNOSIS — I4891 Unspecified atrial fibrillation: Secondary | ICD-10-CM | POA: Diagnosis not present

## 2016-08-13 DIAGNOSIS — K219 Gastro-esophageal reflux disease without esophagitis: Secondary | ICD-10-CM | POA: Diagnosis not present

## 2016-08-13 DIAGNOSIS — E46 Unspecified protein-calorie malnutrition: Secondary | ICD-10-CM | POA: Diagnosis not present

## 2016-08-13 DIAGNOSIS — L89159 Pressure ulcer of sacral region, unspecified stage: Secondary | ICD-10-CM | POA: Diagnosis not present

## 2016-08-13 DIAGNOSIS — Z7901 Long term (current) use of anticoagulants: Secondary | ICD-10-CM | POA: Diagnosis not present

## 2016-08-17 DIAGNOSIS — E46 Unspecified protein-calorie malnutrition: Secondary | ICD-10-CM | POA: Diagnosis not present

## 2016-08-17 DIAGNOSIS — Z7901 Long term (current) use of anticoagulants: Secondary | ICD-10-CM | POA: Diagnosis not present

## 2016-08-17 DIAGNOSIS — R627 Adult failure to thrive: Secondary | ICD-10-CM | POA: Diagnosis not present

## 2016-08-17 DIAGNOSIS — I4891 Unspecified atrial fibrillation: Secondary | ICD-10-CM | POA: Diagnosis not present

## 2016-08-17 DIAGNOSIS — L89609 Pressure ulcer of unspecified heel, unspecified stage: Secondary | ICD-10-CM | POA: Diagnosis not present

## 2016-08-17 DIAGNOSIS — L89159 Pressure ulcer of sacral region, unspecified stage: Secondary | ICD-10-CM | POA: Diagnosis not present

## 2016-08-18 DIAGNOSIS — L89890 Pressure ulcer of other site, unstageable: Secondary | ICD-10-CM | POA: Diagnosis not present

## 2016-08-18 DIAGNOSIS — L89624 Pressure ulcer of left heel, stage 4: Secondary | ICD-10-CM | POA: Diagnosis not present

## 2016-08-18 DIAGNOSIS — L89153 Pressure ulcer of sacral region, stage 3: Secondary | ICD-10-CM | POA: Diagnosis not present

## 2016-08-20 DIAGNOSIS — I4891 Unspecified atrial fibrillation: Secondary | ICD-10-CM | POA: Diagnosis not present

## 2016-08-20 DIAGNOSIS — Z7901 Long term (current) use of anticoagulants: Secondary | ICD-10-CM | POA: Diagnosis not present

## 2016-08-27 DIAGNOSIS — I4891 Unspecified atrial fibrillation: Secondary | ICD-10-CM | POA: Diagnosis not present

## 2016-08-27 DIAGNOSIS — Z7901 Long term (current) use of anticoagulants: Secondary | ICD-10-CM | POA: Diagnosis not present

## 2016-08-30 DIAGNOSIS — L89624 Pressure ulcer of left heel, stage 4: Secondary | ICD-10-CM | POA: Diagnosis not present

## 2016-08-30 DIAGNOSIS — L89153 Pressure ulcer of sacral region, stage 3: Secondary | ICD-10-CM | POA: Diagnosis not present

## 2016-08-31 DIAGNOSIS — E119 Type 2 diabetes mellitus without complications: Secondary | ICD-10-CM | POA: Diagnosis not present

## 2016-08-31 DIAGNOSIS — H2511 Age-related nuclear cataract, right eye: Secondary | ICD-10-CM | POA: Diagnosis not present

## 2016-08-31 DIAGNOSIS — Z7901 Long term (current) use of anticoagulants: Secondary | ICD-10-CM | POA: Diagnosis not present

## 2016-08-31 DIAGNOSIS — Z961 Presence of intraocular lens: Secondary | ICD-10-CM | POA: Diagnosis not present

## 2016-08-31 DIAGNOSIS — I4891 Unspecified atrial fibrillation: Secondary | ICD-10-CM | POA: Diagnosis not present

## 2016-09-01 DIAGNOSIS — L89624 Pressure ulcer of left heel, stage 4: Secondary | ICD-10-CM | POA: Diagnosis not present

## 2016-09-01 DIAGNOSIS — R1310 Dysphagia, unspecified: Secondary | ICD-10-CM | POA: Diagnosis not present

## 2016-09-01 DIAGNOSIS — G20 Parkinson's disease: Secondary | ICD-10-CM | POA: Diagnosis not present

## 2016-09-01 DIAGNOSIS — L89153 Pressure ulcer of sacral region, stage 3: Secondary | ICD-10-CM | POA: Diagnosis not present

## 2016-09-01 DIAGNOSIS — I4891 Unspecified atrial fibrillation: Secondary | ICD-10-CM | POA: Diagnosis not present

## 2016-09-01 DIAGNOSIS — L97519 Non-pressure chronic ulcer of other part of right foot with unspecified severity: Secondary | ICD-10-CM | POA: Diagnosis not present

## 2016-09-02 DIAGNOSIS — G20 Parkinson's disease: Secondary | ICD-10-CM | POA: Diagnosis not present

## 2016-09-02 DIAGNOSIS — R1310 Dysphagia, unspecified: Secondary | ICD-10-CM | POA: Diagnosis not present

## 2016-09-02 DIAGNOSIS — I4891 Unspecified atrial fibrillation: Secondary | ICD-10-CM | POA: Diagnosis not present

## 2016-09-03 DIAGNOSIS — I4891 Unspecified atrial fibrillation: Secondary | ICD-10-CM | POA: Diagnosis not present

## 2016-09-03 DIAGNOSIS — Z7901 Long term (current) use of anticoagulants: Secondary | ICD-10-CM | POA: Diagnosis not present

## 2016-09-04 ENCOUNTER — Inpatient Hospital Stay (HOSPITAL_COMMUNITY)
Admission: EM | Admit: 2016-09-04 | Discharge: 2016-09-12 | DRG: 177 | Disposition: A | Discharge status: Hospice | Payer: Medicare Other | Attending: Internal Medicine | Admitting: Internal Medicine

## 2016-09-04 ENCOUNTER — Emergency Department (HOSPITAL_COMMUNITY): Payer: Medicare Other

## 2016-09-04 ENCOUNTER — Encounter (HOSPITAL_COMMUNITY): Payer: Self-pay

## 2016-09-04 DIAGNOSIS — N400 Enlarged prostate without lower urinary tract symptoms: Secondary | ICD-10-CM | POA: Diagnosis not present

## 2016-09-04 DIAGNOSIS — E43 Unspecified severe protein-calorie malnutrition: Secondary | ICD-10-CM | POA: Diagnosis not present

## 2016-09-04 DIAGNOSIS — I252 Old myocardial infarction: Secondary | ICD-10-CM | POA: Diagnosis not present

## 2016-09-04 DIAGNOSIS — E871 Hypo-osmolality and hyponatremia: Secondary | ICD-10-CM | POA: Diagnosis not present

## 2016-09-04 DIAGNOSIS — R1312 Dysphagia, oropharyngeal phase: Secondary | ICD-10-CM

## 2016-09-04 DIAGNOSIS — R0682 Tachypnea, not elsewhere classified: Secondary | ICD-10-CM | POA: Diagnosis not present

## 2016-09-04 DIAGNOSIS — Z825 Family history of asthma and other chronic lower respiratory diseases: Secondary | ICD-10-CM

## 2016-09-04 DIAGNOSIS — I5020 Unspecified systolic (congestive) heart failure: Secondary | ICD-10-CM

## 2016-09-04 DIAGNOSIS — R413 Other amnesia: Secondary | ICD-10-CM | POA: Diagnosis present

## 2016-09-04 DIAGNOSIS — J189 Pneumonia, unspecified organism: Secondary | ICD-10-CM

## 2016-09-04 DIAGNOSIS — D649 Anemia, unspecified: Secondary | ICD-10-CM | POA: Diagnosis present

## 2016-09-04 DIAGNOSIS — Y95 Nosocomial condition: Secondary | ICD-10-CM | POA: Diagnosis present

## 2016-09-04 DIAGNOSIS — Z882 Allergy status to sulfonamides status: Secondary | ICD-10-CM

## 2016-09-04 DIAGNOSIS — Z7901 Long term (current) use of anticoagulants: Secondary | ICD-10-CM | POA: Diagnosis not present

## 2016-09-04 DIAGNOSIS — T17908A Unspecified foreign body in respiratory tract, part unspecified causing other injury, initial encounter: Secondary | ICD-10-CM | POA: Diagnosis present

## 2016-09-04 DIAGNOSIS — I110 Hypertensive heart disease with heart failure: Secondary | ICD-10-CM | POA: Diagnosis present

## 2016-09-04 DIAGNOSIS — Z66 Do not resuscitate: Secondary | ICD-10-CM | POA: Diagnosis not present

## 2016-09-04 DIAGNOSIS — Z515 Encounter for palliative care: Secondary | ICD-10-CM | POA: Diagnosis present

## 2016-09-04 DIAGNOSIS — I2510 Atherosclerotic heart disease of native coronary artery without angina pectoris: Secondary | ICD-10-CM | POA: Diagnosis not present

## 2016-09-04 DIAGNOSIS — E119 Type 2 diabetes mellitus without complications: Secondary | ICD-10-CM | POA: Diagnosis present

## 2016-09-04 DIAGNOSIS — Z8673 Personal history of transient ischemic attack (TIA), and cerebral infarction without residual deficits: Secondary | ICD-10-CM

## 2016-09-04 DIAGNOSIS — G20 Parkinson's disease: Secondary | ICD-10-CM | POA: Diagnosis present

## 2016-09-04 DIAGNOSIS — I4891 Unspecified atrial fibrillation: Secondary | ICD-10-CM

## 2016-09-04 DIAGNOSIS — E785 Hyperlipidemia, unspecified: Secondary | ICD-10-CM | POA: Diagnosis present

## 2016-09-04 DIAGNOSIS — I509 Heart failure, unspecified: Secondary | ICD-10-CM | POA: Diagnosis not present

## 2016-09-04 DIAGNOSIS — Z885 Allergy status to narcotic agent status: Secondary | ICD-10-CM

## 2016-09-04 DIAGNOSIS — K219 Gastro-esophageal reflux disease without esophagitis: Secondary | ICD-10-CM | POA: Diagnosis present

## 2016-09-04 DIAGNOSIS — Z87891 Personal history of nicotine dependence: Secondary | ICD-10-CM

## 2016-09-04 DIAGNOSIS — I481 Persistent atrial fibrillation: Secondary | ICD-10-CM | POA: Diagnosis present

## 2016-09-04 DIAGNOSIS — L89152 Pressure ulcer of sacral region, stage 2: Secondary | ICD-10-CM | POA: Diagnosis present

## 2016-09-04 DIAGNOSIS — L89212 Pressure ulcer of right hip, stage 2: Secondary | ICD-10-CM | POA: Diagnosis not present

## 2016-09-04 DIAGNOSIS — Z8249 Family history of ischemic heart disease and other diseases of the circulatory system: Secondary | ICD-10-CM

## 2016-09-04 DIAGNOSIS — R1310 Dysphagia, unspecified: Secondary | ICD-10-CM | POA: Diagnosis not present

## 2016-09-04 DIAGNOSIS — I959 Hypotension, unspecified: Secondary | ICD-10-CM | POA: Diagnosis not present

## 2016-09-04 DIAGNOSIS — J690 Pneumonitis due to inhalation of food and vomit: Principal | ICD-10-CM

## 2016-09-04 DIAGNOSIS — Z6820 Body mass index (BMI) 20.0-20.9, adult: Secondary | ICD-10-CM

## 2016-09-04 DIAGNOSIS — I5023 Acute on chronic systolic (congestive) heart failure: Secondary | ICD-10-CM | POA: Diagnosis not present

## 2016-09-04 DIAGNOSIS — R4182 Altered mental status, unspecified: Secondary | ICD-10-CM | POA: Diagnosis not present

## 2016-09-04 DIAGNOSIS — I482 Chronic atrial fibrillation: Secondary | ICD-10-CM | POA: Diagnosis present

## 2016-09-04 DIAGNOSIS — Z86010 Personal history of colonic polyps: Secondary | ICD-10-CM

## 2016-09-04 DIAGNOSIS — Z743 Need for continuous supervision: Secondary | ICD-10-CM | POA: Diagnosis not present

## 2016-09-04 DIAGNOSIS — Z833 Family history of diabetes mellitus: Secondary | ICD-10-CM

## 2016-09-04 DIAGNOSIS — L8990 Pressure ulcer of unspecified site, unspecified stage: Secondary | ICD-10-CM | POA: Insufficient documentation

## 2016-09-04 DIAGNOSIS — R279 Unspecified lack of coordination: Secondary | ICD-10-CM | POA: Diagnosis not present

## 2016-09-04 DIAGNOSIS — F419 Anxiety disorder, unspecified: Secondary | ICD-10-CM | POA: Diagnosis present

## 2016-09-04 DIAGNOSIS — Z79899 Other long term (current) drug therapy: Secondary | ICD-10-CM

## 2016-09-04 DIAGNOSIS — R0602 Shortness of breath: Secondary | ICD-10-CM | POA: Diagnosis not present

## 2016-09-04 DIAGNOSIS — L89892 Pressure ulcer of other site, stage 2: Secondary | ICD-10-CM | POA: Diagnosis present

## 2016-09-04 DIAGNOSIS — Z955 Presence of coronary angioplasty implant and graft: Secondary | ICD-10-CM

## 2016-09-04 DIAGNOSIS — Z888 Allergy status to other drugs, medicaments and biological substances status: Secondary | ICD-10-CM

## 2016-09-04 DIAGNOSIS — L89612 Pressure ulcer of right heel, stage 2: Secondary | ICD-10-CM | POA: Diagnosis present

## 2016-09-04 DIAGNOSIS — H269 Unspecified cataract: Secondary | ICD-10-CM | POA: Diagnosis present

## 2016-09-04 DIAGNOSIS — I4819 Other persistent atrial fibrillation: Secondary | ICD-10-CM | POA: Diagnosis present

## 2016-09-04 DIAGNOSIS — Z886 Allergy status to analgesic agent status: Secondary | ICD-10-CM

## 2016-09-04 LAB — BLOOD GAS, ARTERIAL
ACID-BASE EXCESS: 4.1 mmol/L — AB (ref 0.0–2.0)
Allens test (pass/fail): POSITIVE — AB
BICARBONATE: 28.2 mmol/L — AB (ref 20.0–28.0)
Drawn by: 382351
FIO2: 100
O2 Saturation: 99.6 %
PCO2 ART: 40.6 mmHg (ref 32.0–48.0)
PH ART: 7.452 — AB (ref 7.350–7.450)
PO2 ART: 325 mmHg — AB (ref 83.0–108.0)
Patient temperature: 37

## 2016-09-04 LAB — CBC WITH DIFFERENTIAL/PLATELET
Basophils Absolute: 0 10*3/uL (ref 0.0–0.1)
Basophils Relative: 0 %
EOS ABS: 0.1 10*3/uL (ref 0.0–0.7)
EOS PCT: 1 %
HCT: 31.1 % — ABNORMAL LOW (ref 39.0–52.0)
Hemoglobin: 10.3 g/dL — ABNORMAL LOW (ref 13.0–17.0)
LYMPHS ABS: 1.1 10*3/uL (ref 0.7–4.0)
LYMPHS PCT: 15 %
MCH: 31.8 pg (ref 26.0–34.0)
MCHC: 33.1 g/dL (ref 30.0–36.0)
MCV: 96 fL (ref 78.0–100.0)
MONO ABS: 0.5 10*3/uL (ref 0.1–1.0)
Monocytes Relative: 7 %
Neutro Abs: 5.3 10*3/uL (ref 1.7–7.7)
Neutrophils Relative %: 77 %
PLATELETS: 241 10*3/uL (ref 150–400)
RBC: 3.24 MIL/uL — ABNORMAL LOW (ref 4.22–5.81)
RDW: 13.7 % (ref 11.5–15.5)
WBC: 7 10*3/uL (ref 4.0–10.5)

## 2016-09-04 LAB — URINALYSIS, ROUTINE W REFLEX MICROSCOPIC
GLUCOSE, UA: NEGATIVE mg/dL
HGB URINE DIPSTICK: NEGATIVE
Leukocytes, UA: NEGATIVE
Nitrite: NEGATIVE
PH: 6 (ref 5.0–8.0)
Protein, ur: NEGATIVE mg/dL
SPECIFIC GRAVITY, URINE: 1.02 (ref 1.005–1.030)

## 2016-09-04 LAB — BASIC METABOLIC PANEL
Anion gap: 8 (ref 5–15)
BUN: 18 mg/dL (ref 6–20)
CHLORIDE: 92 mmol/L — AB (ref 101–111)
CO2: 29 mmol/L (ref 22–32)
CREATININE: 0.45 mg/dL — AB (ref 0.61–1.24)
Calcium: 7.9 mg/dL — ABNORMAL LOW (ref 8.9–10.3)
GFR calc Af Amer: >60 mL/min (ref >60)
GLUCOSE: 154 mg/dL — AB (ref 65–99)
POTASSIUM: 4.4 mmol/L (ref 3.5–5.1)
SODIUM: 129 mmol/L — AB (ref 135–145)

## 2016-09-04 LAB — I-STAT CG4 LACTIC ACID, ED: LACTIC ACID, VENOUS: 2.71 mmol/L — AB (ref 0.5–1.9)

## 2016-09-04 MED ORDER — SODIUM CHLORIDE 0.9 % IV BOLUS (SEPSIS)
500.0000 mL | Freq: Once | INTRAVENOUS | Status: AC
  Administered 2016-09-04: 500 mL via INTRAVENOUS

## 2016-09-04 MED ORDER — SODIUM CHLORIDE 0.9 % IV BOLUS (SEPSIS)
1500.0000 mL | Freq: Once | INTRAVENOUS | Status: AC
  Administered 2016-09-04: 1500 mL via INTRAVENOUS

## 2016-09-04 MED ORDER — CLINDAMYCIN PHOSPHATE 600 MG/50ML IV SOLN
600.0000 mg | Freq: Once | INTRAVENOUS | Status: AC
  Administered 2016-09-04: 600 mg via INTRAVENOUS
  Filled 2016-09-04: qty 50

## 2016-09-04 NOTE — H&P (Signed)
History and Physical    [name] BJY:[id number] DOB: 04-Aug-1930 DOA: 09/04/2016  Referring MD/NP/PA: Mancel BaleElliott Wentz, MD PCP: Rudi HeapMOORE, DONALD, MD   Outpatient Specialists:   Gastroenterology: Meryl DareMalcolm T Stark, MD  Neurology: York Spanielharles K Willis, MD  Cardiology: Rollene RotundaJames Hochrein, MD  Patient coming from: SN[facility]  Chief Complaint: Choking on food  HPI: [name] is a 82 y.o. male with medical history significant of DM type 2, anxiety, GERD, and HTN presents to the ED with complaints of choking while eating that occurred earlier this evening. Patient is unresponsive and is unable to give a history.  ED Course: While in the ED, sodium 129, chloride 92, Cr 0.45, calcium 7.9, Lactic acid 2.71, Hgb 10.3. PO2 of 325. UA unremarkable. CXR shows CHF with mild edema along with right lower lobe density may represent atelectasis or infiltrate. EKG shows A-fib with RVR. Will admit for evaluation.  Review of Systems: As per HPI otherwise 10 point review of systems negative.   Past Medical History:  Diagnosis Date  . Anxiety   . Atrial fibrillation, persistent (HCC)   . BPH (benign prostatic hyperplasia)   . CAD (coronary artery disease)    Anterior MI and DES stenting 2004, Vfib arrest; left main 25% stenosis, LAD 80% stenosis, 99% stenosis, 25% circumflex stenosis, 25% ramus intermedius stenosis, 50-40% right coronary artery stenosis, 90% RV branch stenosis with right to left collaterals; EF 55% with mild naterior hypokinesis (underwent Taxus stenting at the time) . Stress perfusion study January 2012 EF 48% with old apical infarct  . Cataract   . Diabetes mellitus   . Diverticulosis   . Dyslipidemia   . Essential and other specified forms of tremor 05/05/2013  . GERD (gastroesophageal reflux disease)   . GIB (gastrointestinal bleeding)   . Hearing deficit    Wears hearing aids  . Hemorrhoids   . Hyperlipidemia   . Hyperplastic colon polyp   . Hypertension   . Memory deficits 05/05/2013  .  Myocardial infarction   . Obesity   . Stroke (HCC)   . Tremor   . Tremor, essential 12/11/2015  . UC (ulcerative colitis confined to rectum) (HCC)    Left-sided    Past Surgical History:  Procedure Laterality Date  . CATARACT EXTRACTION W/PHACO  10/27/2012   Procedure: CATARACT EXTRACTION PHACO AND INTRAOCULAR LENS PLACEMENT (IOC);  Surgeon: Gemma PayorKerry Hunt, MD;  Location: [facility];  Service: Ophthalmology;  Laterality: Left;  CDE 22.67  . CORONARY ANGIOPLASTY WITH STENT PLACEMENT     Taxus stenting  . HAMMER TOE SURGERY     right  . INGUINAL HERNIA REPAIR     right, bilateral     reports that he quit smoking about 57 years ago. He has quit using smokeless tobacco. His smokeless tobacco use included Chew. He reports that he does not drink alcohol or use drugs.  Allergies  Allergen Reactions  . Asa-Apap-Salicyl-Caff     Takes warfarin  . Codeine Other (See Comments)    Unknown reaction  . Nsaids Other (See Comments)    On warfarin  . Sulfonamide Derivatives Nausea And Vomiting    Family History  Problem Relation Age of Onset  . Alzheimer's disease Brother   . Diabetes Brother     x 4  . Diabetes Mother     ?  . Diabetes Father   . Heart disease Father   . Heart disease Brother     x 3  . COPD Sister   .  Diabetes Sister   . Heart disease Paternal Grandfather   . Colon cancer Neg Hx     Prior to Admission medications   Medication Sig Start Date End Date Taking? Authorizing Provider  atorvastatin (LIPITOR) 40 MG tablet TAKE ONE TABLET AT BEDTIME 10/04/15   [name], MD  Cholecalciferol (VITAMIN D) 1000 UNITS capsule Take 1 capsule (1,000 Units total) by mouth daily. 06/26/13   Henrene Pastor, PharmD  digoxin (LANOXIN) 0.125 MG tablet TAKE 1 TABLET IN THE MORNING 10/08/15   [name], MD  hydrOXYzine (ATARAX/VISTARIL) 10 MG tablet Take one-half tablet daily as needed Patient not taking: Reported on 01/10/2016 01/03/16   [name], MD  metoprolol (LOPRESSOR) 100  MG tablet TAKE 1 & 1/2 TABLETS TWICE A DAY 01/06/16   [name], MD  mirabegron ER (MYRBETRIQ) 25 MG TB24 tablet Take 1 tablet (25 mg total) by mouth daily. 01/16/16   [name], MD  Multiple Vitamin (MULTIVITAMIN WITH MINERALS) TABS tablet Take 1 tablet by mouth daily. 08/28/15   [name], MD  nitroGLYCERIN (NITROSTAT) 0.4 MG SL tablet Place 1 tablet (0.4 mg total) under the tongue every 5 (five) minutes as needed for chest pain. 10/08/14   [name], MD  PARoxetine (PAXIL) 30 MG tablet Take 1 tablet (30 mg total) by mouth every morning. 01/16/16   [name], MD  sulfaSALAzine (AZULFIDINE) 500 MG EC tablet Take 1 tablet (500 mg total) by mouth 2 (two) times daily. Take 500 mg twice a day by mouth for 1 week, then 1000 mg BID Patient taking differently: Take 500 mg by mouth every morning.  11/02/15   [name], MD  vitamin B-12 (CYANOCOBALAMIN) 1000 MCG tablet TAKE 1 TABLET IN THE MORNING 10/08/15   [name], MD  warfarin (COUMADIN) 5 MG tablet Takes as directed Patient taking differently: take one tablet on Tuesdays and Thursdays, then take one-half tablet on all other days 11/01/15   [name], MD    Physical Exam: Vitals:   09/04/16 2012 09/04/16 2013 09/04/16 2100 09/04/16 2111  BP:  130/100  134/89  Pulse:  76    Resp:  18 22   Temp: 99.3 F (37.4 C)   99.3 F (37.4 C)  TempSrc: Rectal   Rectal  SpO2:  92% 100% 99%      Constitutional: NAD, calm, comfortable Vitals:   09/04/16 2012 09/04/16 2013 09/04/16 2100 09/04/16 2111  BP:  130/100  134/89  Pulse:  76    Resp:  18 22   Temp: 99.3 F (37.4 C)   99.3 F (37.4 C)  TempSrc: Rectal   Rectal  SpO2:  92% 100% 99%   Eyes: PERRL, lids and conjunctivae normal ENMT: Mucous membranes are moist. Posterior pharynx clear of any exudate or lesions.Normal dentition.  Neck: normal, supple, no masses, no thyromegaly Respiratory: clear to auscultation bilaterally, no wheezing, no crackles. Normal  respiratory effort. No accessory muscle use.  Cardiovascular: Regular rate and rhythm, no murmurs / rubs / gallops. No extremity edema. 2+ pedal pulses. No carotid bruits.  Abdomen: no tenderness, no masses palpated. No hepatosplenomegaly. Bowel sounds positive.  Musculoskeletal: no clubbing / cyanosis. No joint deformity upper and lower extremities. Good ROM, no contractures. Normal muscle tone.  Skin: no rashes, lesions, ulcers. No induration Neurologic: CN 2-12 grossly intact. Sensation intact, DTR normal. Strength 5/5 in all 4.  Psychiatric: Normal judgment and insight. Alert and oriented x 3.  Normal mood.    Labs on Admission: I have personally reviewed following labs and imaging studies  CBC:  Recent Labs Lab 09/04/16 2000  WBC 7.0  NEUTROABS 5.3  HGB 10.3*  HCT 31.1*  MCV 96.0  PLT 241   Basic Metabolic Panel:  Recent Labs Lab 09/04/16 2000  NA 129*  K 4.4  CL 92*  CO2 29  GLUCOSE 154*  BUN 18  CREATININE 0.45*  CALCIUM 7.9*   Urine analysis:    Component Value Date/Time   COLORURINE AMBER (A) 09/04/2016 2001   APPEARANCEUR CLEAR 09/04/2016 2001   LABSPEC 1.020 09/04/2016 2001   PHURINE 6.0 09/04/2016 2001   GLUCOSEU NEGATIVE 09/04/2016 2001   HGBUR NEGATIVE 09/04/2016 2001   BILIRUBINUR SMALL (A) 09/04/2016 2001   KETONESUR TRACE (A) 09/04/2016 2001   PROTEINUR NEGATIVE 09/04/2016 2001   UROBILINOGEN 2.0 (H) 09/26/2013 1407   NITRITE NEGATIVE 09/04/2016 2001   LEUKOCYTESUR NEGATIVE 09/04/2016 2001     Radiological Exams on Admission: Dg Chest Port 1 View  Result Date: 09/04/2016 CLINICAL DATA:  Short of breath EXAM: PORTABLE CHEST 1 VIEW COMPARISON:  06/20/2016 FINDINGS: Cardiac enlargement with vascular congestion. Probable early pulmonary edema Right lower lobe airspace disease most likely atelectasis versus infiltrate. Possible right effusion. No effusion on the left IMPRESSION: Congestive heart failure with mild edema Right lower lobe density may  represent atelectasis or infiltrate. Possible right effusion also present. Electronically Signed   By: Marlan Palau M.D.   On: 09/04/2016 20:32    EKG: Independently reviewed. A-fib with RVR  Assessment/Plan Active Problems:   Atrial fibrillation, persistent (HCC)   Memory deficits   Hospital acquired PNA   HCAP (healthcare-associated pneumonia)    1. HCAP. WBC wnl. Right lower lobe density may represent atelectasis or infiltrate. NPO for now. Will give IV vanc and IV zosyn. Will get speech to evaluate for aspiration.  2. Elevated lactic acid. Secondary to HCAP. Will monitor carefully. 3. Hypovolemic Hyponatremia. Will start on IV saline. Will recheck BMP in the am. 4. Normocytic anemia. H&H are low. No evidence of bleed. Recheck CBC in the am. 5. A-fib, persistent. EKG shows A-fib with RVR. Will start on Cardizem IV PRN. Continue with home BB.  6. DM type 2. Stable. Start on SSI.  7. HTN. Continue on antihypertensives. Monitor carefully. 8. GERD. Continue Protonix. 9. Parkinson's disease. Noted   DVT prophylaxis: Coumadin per pharmacy.  Code Status: DNR Family Communication: No family at bedside. He is unresponsive. Disposition Plan: Discharge once improved Consults called: None Admission status: Admit to inpatient   Houston Siren, MD   FACP [facility] If 7PM-7AM, please contact night-coverage [email or web address] Password [email or web address]  09/04/2016, 10:05 PM   By signing my name below, I, Bobbie Stack, attest that this documentation has been prepared under the direction and in the presence of Houston Siren, MD. Electronically signed: Bobbie Stack, Scribe.  09/04/16, 10:30 PM

## 2016-09-04 NOTE — ED Provider Notes (Signed)
[facility]-EMERGENCY DEPT Provider Note   CSN: [id number] Arrival date & time: 09/04/16  1943     History   Chief Complaint Chief Complaint  Patient presents with  . Shortness of Breath    HPI [name] is a 82 y.o. male.  He presents for evaluation of choking while eating food. Tonight, at his facility. He is unable to give any history. Last DO NOT RESUSCITATE status is no code in the chart. However, he did not present with a no code document.  Level V caveat- altered mental status  HPI  Past Medical History:  Diagnosis Date  . Anxiety   . Atrial fibrillation, persistent (HCC)   . BPH (benign prostatic hyperplasia)   . CAD (coronary artery disease)    Anterior MI and DES stenting 2004, Vfib arrest; left main 25% stenosis, LAD 80% stenosis, 99% stenosis, 25% circumflex stenosis, 25% ramus intermedius stenosis, 50-40% right coronary artery stenosis, 90% RV branch stenosis with right to left collaterals; EF 55% with mild naterior hypokinesis (underwent Taxus stenting at the time) . Stress perfusion study January 2012 EF 48% with old apical infarct  . Cataract   . Diabetes mellitus   . Diverticulosis   . Dyslipidemia   . Essential and other specified forms of tremor 05/05/2013  . GERD (gastroesophageal reflux disease)   . GIB (gastrointestinal bleeding)   . Hearing deficit    Wears hearing aids  . Hemorrhoids   . Hyperlipidemia   . Hyperplastic colon polyp   . Hypertension   . Memory deficits 05/05/2013  . Myocardial infarction   . Obesity   . Stroke (HCC)   . Tremor   . Tremor, essential 12/11/2015  . UC (ulcerative colitis confined to rectum) [facility])    Left-sided    Patient Active Problem List   Diagnosis Date Noted  . Pressure injury of skin 09/05/2016  . Hospital acquired PNA 09/04/2016  . HCAP (healthcare-associated pneumonia) 09/04/2016  . Fall 01/10/2016  . Hyponatremia 01/10/2016  . Tremor, essential 12/11/2015  . Altered mental status 11/30/2015  . Type  2 diabetes, diet controlled (HCC) 06/28/2015  . Cerebrovascular disease 12/05/2014  . ASCVD (arteriosclerotic cardiovascular disease) 12/05/2014  . Persistent atrial fibrillation (HCC) 12/05/2014  . High risk medication use 06/22/2013  . Hypokalemia 06/22/2013  . Memory deficits 05/05/2013  . Essential tremor 05/05/2013  . Cerebrovascular disease, unspecified 01/16/2013  . CAD (coronary artery disease)   . BPH (benign prostatic hypertrophy) 02/11/2011  . Premature atrial complexes 02/11/2011  . Panic attack 02/11/2011  . Hyperlipidemia 02/12/2010  . Atrial fibrillation, persistent (HCC) 10/30/2009  . GERD 03/12/2009  . ULCERATIVE COLITIS-LEFT SIDE 03/12/2009    Past Surgical History:  Procedure Laterality Date  . CATARACT EXTRACTION W/PHACO  10/27/2012   Procedure: CATARACT EXTRACTION PHACO AND INTRAOCULAR LENS PLACEMENT (IOC);  Surgeon: Gemma Payor, MD;  Location: [facility];  Service: Ophthalmology;  Laterality: Left;  CDE 22.67  . CORONARY ANGIOPLASTY WITH STENT PLACEMENT     Taxus stenting  . HAMMER TOE SURGERY     right  . INGUINAL HERNIA REPAIR     right, bilateral       Home Medications    Prior to Admission medications   Medication Sig Start Date End Date Taking? Authorizing Provider  Amino Acids-Protein Hydrolys (FEEDING SUPPLEMENT, PRO-STAT SUGAR FREE 64,) LIQD Take 30 mLs by mouth 2 (two) times daily.   Yes Historical Provider, MD  atorvastatin (LIPITOR) 40 MG tablet TAKE ONE TABLET AT BEDTIME 10/04/15  Yes [name], MD  Cholecalciferol (VITAMIN D) 1000 UNITS capsule Take 1 capsule (1,000 Units total) by mouth daily. 06/26/13  Yes Henrene Pastor, PharmD  digoxin (LANOXIN) 0.125 MG tablet TAKE 1 TABLET IN THE MORNING 10/08/15  Yes [name], MD  haloperidol (HALDOL) 0.5 MG tablet Take 0.5 mg by mouth 2 (two) times daily.   Yes Historical Provider, MD  hydrOXYzine (ATARAX/VISTARIL) 10 MG tablet Take one-half tablet daily as needed Patient taking differently: Take  5 mg by mouth daily as needed for itching.  01/03/16  Yes [name], MD  metoprolol (LOPRESSOR) 100 MG tablet TAKE 1 & 1/2 TABLETS TWICE A DAY Patient taking differently: TAKE 1 TABLET TWICE A DAY 01/06/16  Yes [name], MD  mirabegron ER (MYRBETRIQ) 25 MG TB24 tablet Take 1 tablet (25 mg total) by mouth daily. Patient taking differently: Take 50 mg by mouth daily.  01/16/16  Yes [name], MD  Multiple Vitamin (MULTIVITAMIN WITH MINERALS) TABS tablet Take 1 tablet by mouth daily. 08/28/15  Yes [name], MD  nitroGLYCERIN (NITROSTAT) 0.4 MG SL tablet Place 1 tablet (0.4 mg total) under the tongue every 5 (five) minutes as needed for chest pain. 10/08/14  Yes [name], MD  sulfaSALAzine (AZULFIDINE) 500 MG EC tablet Take 1 tablet (500 mg total) by mouth 2 (two) times daily. Take 500 mg twice a day by mouth for 1 week, then 1000 mg BID Patient taking differently: Take 500 mg by mouth 2 (two) times daily.  11/02/15  Yes [name], MD  vitamin B-12 (CYANOCOBALAMIN) 1000 MCG tablet TAKE 1 TABLET IN THE MORNING 10/08/15  Yes [name], MD  warfarin (COUMADIN) 2 MG tablet Take 4.5 mg by mouth every evening.   Yes Historical Provider, MD    Family History Family History  Problem Relation Age of Onset  . Alzheimer's disease Brother   . Diabetes Brother     x 4  . Diabetes Mother     ?  . Diabetes Father   . Heart disease Father   . Heart disease Brother     x 3  . COPD Sister   . Diabetes Sister   . Heart disease Paternal Grandfather   . Colon cancer Neg Hx     Social History Social History  Substance Use Topics  . Smoking status: Former Smoker    Quit date: 11/23/1958  . Smokeless tobacco: Former Neurosurgeon    Types: Chew     Comment: Quit years ago  . Alcohol use No     Allergies   Asa-apap-salicyl-caff; Caffeine; Codeine; Nsaids; and Sulfonamide derivatives   Review of Systems Review of Systems  Unable to perform ROS: Mental status change      Physical Exam Updated Vital Signs BP 104/68 (BP Location: Right Arm)   Pulse 87   Temp 98.1 F (36.7 C) (Oral)   Resp 18   Wt 136 lb 0.4 oz (61.7 kg)   SpO2 99%   BMI 20.09 kg/m   Physical Exam  Constitutional: He appears well-developed. He appears distressed (Uncomfortable).  Elderly, cachectic  HENT:  Head: Normocephalic and atraumatic.  Right Ear: External ear normal.  Left Ear: External ear normal.  Eyes: Conjunctivae and EOM are normal. Pupils are equal, round, and reactive to light.  Neck: Normal range of motion and phonation normal. Neck supple.  Cardiovascular: Normal heart sounds.   Irregular tachycardia  Pulmonary/Chest: He exhibits no bony tenderness.  Tachypneic, rhonchi diffuse. No increased work of breathing.  Abdominal: Soft. There is no tenderness.  Musculoskeletal:  Flexion contractures arms and legs bilaterally  Neurological: No sensory deficit.  Increased tone bilaterally, bilaterally. No evident seizures. Pain response to sternal rub, groan.  Skin: Skin is warm, dry and intact. No rash noted.  Psychiatric:  Stuporous  Nursing note and vitals reviewed.    ED Treatments / Results  Labs (all labs ordered are listed, but only abnormal results are displayed) Labs Reviewed  BASIC METABOLIC PANEL - Abnormal; Notable for the following:       Result Value   Sodium 129 (*)    Chloride 92 (*)    Glucose, Bld 154 (*)    Creatinine, Ser 0.45 (*)    Calcium 7.9 (*)    All other components within normal limits  CBC WITH DIFFERENTIAL/PLATELET - Abnormal; Notable for the following:    RBC 3.24 (*)    Hemoglobin 10.3 (*)    HCT 31.1 (*)    All other components within normal limits  BLOOD GAS, ARTERIAL - Abnormal; Notable for the following:    pH, Arterial 7.452 (*)    pO2, Arterial 325 (*)    Bicarbonate 28.2 (*)    Acid-Base Excess 4.1 (*)    Allens test (pass/fail) POSITIVE (*)    All other components within normal limits  URINALYSIS, ROUTINE W  REFLEX MICROSCOPIC (NOT AT [facility]) - Abnormal; Notable for the following:    Color, Urine AMBER (*)    Bilirubin Urine SMALL (*)    Ketones, ur TRACE (*)    All other components within normal limits  BASIC METABOLIC PANEL - Abnormal; Notable for the following:    Sodium 128 (*)    Chloride 96 (*)    Glucose, Bld 112 (*)    Creatinine, Ser 0.39 (*)    Calcium 7.4 (*)    Anion gap 3 (*)    All other components within normal limits  CBC - Abnormal; Notable for the following:    RBC 2.88 (*)    Hemoglobin 9.0 (*)    HCT 27.3 (*)    All other components within normal limits  TSH - Abnormal; Notable for the following:    TSH 9.933 (*)    All other components within normal limits  GLUCOSE, CAPILLARY - Abnormal; Notable for the following:    Glucose-Capillary 107 (*)    All other components within normal limits  PROTIME-INR - Abnormal; Notable for the following:    Prothrombin Time 31.0 (*)    All other components within normal limits  GLUCOSE, CAPILLARY - Abnormal; Notable for the following:    Glucose-Capillary 104 (*)    All other components within normal limits  I-STAT CG4 LACTIC ACID, ED - Abnormal; Notable for the following:    Lactic Acid, Venous 2.71 (*)    All other components within normal limits  MRSA PCR SCREENING  GLUCOSE, CAPILLARY  LACTIC ACID, PLASMA  GLUCOSE, CAPILLARY  GLUCOSE, CAPILLARY  BASIC METABOLIC PANEL  CBC  I-STAT CG4 LACTIC ACID, ED    EKG  EKG Interpretation  Date/Time:  Friday September 04 2016 19:51:36 EDT Ventricular Rate:  143 PR Interval:    QRS Duration: 102 QT Interval:  291 QTC Calculation: 449 R Axis:   149 Text Interpretation:  Atrial fibrillation with rapid V-rate Probable anterior infarct, age indeterminate Since last tracing rate faster Confirmed by [facility]  MD, Percy Winterrowd [phone]) on 09/04/2016 8:09:53 PM  Radiology Dg Chest Port 1 View  Result Date: 09/04/2016 CLINICAL DATA:  Short of breath EXAM: PORTABLE CHEST 1 VIEW  COMPARISON:  06/20/2016 FINDINGS: Cardiac enlargement with vascular congestion. Probable early pulmonary edema Right lower lobe airspace disease most likely atelectasis versus infiltrate. Possible right effusion. No effusion on the left IMPRESSION: Congestive heart failure with mild edema Right lower lobe density may represent atelectasis or infiltrate. Possible right effusion also present. Electronically Signed   By: Marlan Palauharles  Clark M.D.   On: 09/04/2016 20:32    Procedures Procedures (including critical care time)  Medications Ordered in ED Medications  haloperidol (HALDOL) tablet 0.5 mg (0.5 mg Oral Not Given 09/05/16 1000)  mirabegron ER (MYRBETRIQ) tablet 50 mg (50 mg Oral Given 09/05/16 1038)  metoprolol (LOPRESSOR) tablet 100 mg (100 mg Oral Given 09/05/16 1039)  sulfaSALAzine (AZULFIDINE) EC tablet 500 mg (500 mg Oral Given 09/05/16 1000)  digoxin (LANOXIN) tablet 125 mcg (125 mcg Oral Given 09/05/16 1039)  vitamin B-12 (CYANOCOBALAMIN) tablet 1,000 mcg (1,000 mcg Oral Given 09/05/16 1038)  atorvastatin (LIPITOR) tablet 40 mg (40 mg Oral Not Given 09/05/16 0157)  cholecalciferol (VITAMIN D) tablet 1,000 Units (1,000 Units Oral Given 09/05/16 1039)  sodium chloride flush (NS) 0.9 % injection 3 mL (3 mLs Intravenous Given 09/05/16 1000)  dextrose 5 %-0.9 % sodium chloride infusion ( Intravenous New Bag/Given 09/05/16 0153)  diltiazem (CARDIZEM) injection 10 mg (not administered)  insulin aspart (novoLOG) injection 0-9 Units (0 Units Subcutaneous Not Given 09/05/16 1624)  chlorhexidine (PERIDEX) 0.12 % solution 15 mL (15 mLs Mouth Rinse Given 09/05/16 1040)  MEDLINE mouth rinse (15 mLs Mouth Rinse Given 09/05/16 1600)  vancomycin (VANCOCIN) IVPB 750 mg/150 ml premix (750 mg Intravenous Given 09/05/16 1557)  warfarin (COUMADIN) tablet 4 mg (4 mg Oral Not Given 09/05/16 1600)  Warfarin - Pharmacist Dosing Inpatient (not administered)  piperacillin-tazobactam (ZOSYN) IVPB 3.375 g (3.375 g  Intravenous Given 09/05/16 1138)  sodium chloride 0.9 % bolus 500 mL (0 mLs Intravenous Stopped 09/04/16 2111)  sodium chloride 0.9 % bolus 500 mL (0 mLs Intravenous Stopped 09/04/16 2253)  sodium chloride 0.9 % bolus 1,500 mL (0 mLs Intravenous Stopped 09/04/16 2253)  clindamycin (CLEOCIN) IVPB 600 mg (0 mg Intravenous Stopped 09/04/16 2253)  vancomycin (VANCOCIN) IVPB 750 mg/150 ml premix (750 mg Intravenous Given 09/05/16 0354)     Initial Impression / Assessment and Plan / ED Course  I have reviewed the triage vital signs and the nursing notes.  Pertinent labs & imaging results that were available during my care of the patient were reviewed by me and considered in my medical decision making (see chart for details).  Clinical Course  Value Comment By Time  Sodium: (!) 129 Low Mancel BaleElliott Bonny Vanleeuwen, MD 10/13 2057  Chloride: (!) [address] Low Mancel BaleElliott Dimitris Shanahan, MD 10/13 2057  Glucose: (!) 154 High Mancel BaleElliott Donjuan Robison, [place] 16/1010/13 2057  Calcium: (!) 7.9 Low Mancel BaleElliott Socorro Ebron, MD 10/13 2057  Hemoglobin: (!) 10.3 Low Mancel BaleElliott Branndon Tuite, MD 10/13 2057  Lactic Acid, Venous: (!!) 2.71 High Mancel BaleElliott Vishruth Seoane, MD 10/13 2058   The patient is now alert and responsive to voice and states that nothing is wrong. He is confused. Mancel BaleElliott Kenai Fluegel, MD 10/13 2144    Medications  haloperidol (HALDOL) tablet 0.5 mg (0.5 mg Oral Not Given 09/05/16 1000)  mirabegron ER (MYRBETRIQ) tablet 50 mg (50 mg Oral Given 09/05/16 1038)  metoprolol (LOPRESSOR) tablet 100 mg (100 mg Oral Given 09/05/16 1039)  sulfaSALAzine (AZULFIDINE) EC tablet 500 mg (500 mg  Oral Given 09/05/16 1000)  digoxin (LANOXIN) tablet 125 mcg (125 mcg Oral Given 09/05/16 1039)  vitamin B-12 (CYANOCOBALAMIN) tablet 1,000 mcg (1,000 mcg Oral Given 09/05/16 1038)  atorvastatin (LIPITOR) tablet 40 mg (40 mg Oral Not Given 09/05/16 0157)  cholecalciferol (VITAMIN D) tablet 1,000 Units (1,000 Units Oral Given 09/05/16 1039)  sodium chloride flush (NS) 0.9 % injection 3 mL (3 mLs  Intravenous Given 09/05/16 1000)  dextrose 5 %-0.9 % sodium chloride infusion ( Intravenous New Bag/Given 09/05/16 0153)  diltiazem (CARDIZEM) injection 10 mg (not administered)  insulin aspart (novoLOG) injection 0-9 Units (0 Units Subcutaneous Not Given 09/05/16 1624)  chlorhexidine (PERIDEX) 0.12 % solution 15 mL (15 mLs Mouth Rinse Given 09/05/16 1040)  MEDLINE mouth rinse (15 mLs Mouth Rinse Given 09/05/16 1600)  vancomycin (VANCOCIN) IVPB 750 mg/150 ml premix (750 mg Intravenous Given 09/05/16 1557)  warfarin (COUMADIN) tablet 4 mg (4 mg Oral Not Given 09/05/16 1600)  Warfarin - Pharmacist Dosing Inpatient (not administered)  piperacillin-tazobactam (ZOSYN) IVPB 3.375 g (3.375 g Intravenous Given 09/05/16 1138)  sodium chloride 0.9 % bolus 500 mL (0 mLs Intravenous Stopped 09/04/16 2111)  sodium chloride 0.9 % bolus 500 mL (0 mLs Intravenous Stopped 09/04/16 2253)  sodium chloride 0.9 % bolus 1,500 mL (0 mLs Intravenous Stopped 09/04/16 2253)  clindamycin (CLEOCIN) IVPB 600 mg (0 mg Intravenous Stopped 09/04/16 2253)  vancomycin (VANCOCIN) IVPB 750 mg/150 ml premix (750 mg Intravenous Given 09/05/16 0354)    Patient Vitals for the past 24 hrs:  BP Temp Temp src Pulse Resp SpO2 Weight  09/05/16 1339 104/68 98.1 F (36.7 C) Oral 87 18 99 % -  09/05/16 0603 97/64 97.8 F (36.6 C) Axillary 97 18 98 % -  09/05/16 0100 120/77 98.9 F (37.2 C) Oral (!) 136 18 96 % 136 lb 0.4 oz (61.7 kg)  09/04/16 2300 111/69 - - - 19 99 % -  09/04/16 2236 - - - - 19 99 % -  09/04/16 2230 119/84 - - - 19 100 % -  09/04/16 2111 134/89 99.3 F (37.4 C) Rectal - - 99 % -  09/04/16 2100 - - - - 22 100 % -  09/04/16 2013 130/100 - - 76 18 92 % -  09/04/16 2012 - 99.3 F (37.4 C) Rectal - - - -    9:59 PM-Consult complete with Hospitalist. Patient case explained and discussed. He agrees to admit patient for further evaluation and treatment. Call ended at 22:15   Final Clinical Impressions(s) / ED  Diagnoses   Final diagnoses:  Aspiration pneumonia of right lower lobe, unspecified aspiration pneumonia type (HCC)  Altered mental status, unspecified altered mental status type    Debilitated patient is a skilled nursing facility, who choked while eating tonight. Percent with altered mental status, and mild respiratory distress. He has persistent tachycardia. Evaluation is most consistent with aspiration pneumonia. First lactate mildly elevated. Treated with high-volume fluid, and pending second lactate measurement. He will require admission for further treatment.  Nursing Notes Reviewed/ Care Coordinated Applicable Imaging Reviewed Interpretation of Laboratory Data incorporated into ED treatment  Plan: Admit  New Prescriptions Current Discharge Medication List       Mancel Bale, MD 09/05/16 1919

## 2016-09-04 NOTE — ED Triage Notes (Signed)
Pt is resident of [facility], reportedly family was feeding him supper when he started coughing and gagging on his food.  Pt is normally alert, but is confused per normal.  Pt does not speak.

## 2016-09-04 NOTE — ED Notes (Signed)
Contact Neale BurlyJudy Lemmons if needed [phone]

## 2016-09-05 ENCOUNTER — Inpatient Hospital Stay (HOSPITAL_COMMUNITY): Payer: Medicare Other

## 2016-09-05 DIAGNOSIS — L8990 Pressure ulcer of unspecified site, unspecified stage: Secondary | ICD-10-CM | POA: Insufficient documentation

## 2016-09-05 LAB — CBC
HEMATOCRIT: 27.3 % — AB (ref 39.0–52.0)
Hemoglobin: 9 g/dL — ABNORMAL LOW (ref 13.0–17.0)
MCH: 31.3 pg (ref 26.0–34.0)
MCHC: 33 g/dL (ref 30.0–36.0)
MCV: 94.8 fL (ref 78.0–100.0)
PLATELETS: 187 10*3/uL (ref 150–400)
RBC: 2.88 MIL/uL — ABNORMAL LOW (ref 4.22–5.81)
RDW: 13.7 % (ref 11.5–15.5)
WBC: 6.2 10*3/uL (ref 4.0–10.5)

## 2016-09-05 LAB — BASIC METABOLIC PANEL
Anion gap: 3 — ABNORMAL LOW (ref 5–15)
BUN: 14 mg/dL (ref 6–20)
CHLORIDE: 96 mmol/L — AB (ref 101–111)
CO2: 29 mmol/L (ref 22–32)
CREATININE: 0.39 mg/dL — AB (ref 0.61–1.24)
Calcium: 7.4 mg/dL — ABNORMAL LOW (ref 8.9–10.3)
GFR calc Af Amer: >60 mL/min (ref >60)
GFR calc non Af Amer: >60 mL/min (ref >60)
GLUCOSE: 112 mg/dL — AB (ref 65–99)
POTASSIUM: 3.8 mmol/L (ref 3.5–5.1)
Sodium: 128 mmol/L — ABNORMAL LOW (ref 135–145)

## 2016-09-05 LAB — GLUCOSE, CAPILLARY
GLUCOSE-CAPILLARY: 95 mg/dL (ref 65–99)
GLUCOSE-CAPILLARY: 104 mg/dL — AB (ref 65–99)
GLUCOSE-CAPILLARY: 95 mg/dL (ref 65–99)
GLUCOSE-CAPILLARY: 100 mg/dL — AB (ref 65–99)
Glucose-Capillary: 107 mg/dL — ABNORMAL HIGH (ref 65–99)
Glucose-Capillary: 97 mg/dL (ref 65–99)

## 2016-09-05 LAB — LACTIC ACID, PLASMA: Lactic Acid, Venous: 1 mmol/L (ref 0.5–1.9)

## 2016-09-05 LAB — MRSA PCR SCREENING: MRSA by PCR: NEGATIVE

## 2016-09-05 LAB — PROTIME-INR
INR: 2.91
PROTHROMBIN TIME: 31 s — AB (ref 11.4–15.2)

## 2016-09-05 LAB — TSH: TSH: 9.933 u[IU]/mL — ABNORMAL HIGH (ref 0.350–4.500)

## 2016-09-05 MED ORDER — VANCOMYCIN HCL IN DEXTROSE 750-5 MG/150ML-% IV SOLN
750.0000 mg | Freq: Once | INTRAVENOUS | Status: AC
  Administered 2016-09-05: 750 mg via INTRAVENOUS
  Filled 2016-09-05: qty 150

## 2016-09-05 MED ORDER — DEXTROSE 5 % IV SOLN
1.0000 g | Freq: Two times a day (BID) | INTRAVENOUS | Status: DC
  Administered 2016-09-05: 1 g via INTRAVENOUS
  Filled 2016-09-05 (×6): qty 1

## 2016-09-05 MED ORDER — DILTIAZEM HCL 25 MG/5ML IV SOLN
10.0000 mg | Freq: Four times a day (QID) | INTRAVENOUS | Status: DC | PRN
  Filled 2016-09-05: qty 5

## 2016-09-05 MED ORDER — ORAL CARE MOUTH RINSE
15.0000 mL | Freq: Two times a day (BID) | OROMUCOSAL | Status: DC
  Administered 2016-09-05 – 2016-09-10 (×10): 15 mL via OROMUCOSAL

## 2016-09-05 MED ORDER — SODIUM CHLORIDE 0.9% FLUSH
3.0000 mL | Freq: Two times a day (BID) | INTRAVENOUS | Status: DC
  Administered 2016-09-05 – 2016-09-11 (×12): 3 mL via INTRAVENOUS

## 2016-09-05 MED ORDER — WARFARIN SODIUM 2 MG PO TABS: 4.0000 mg | ORAL_TABLET | Freq: Once | ORAL | Status: DC

## 2016-09-05 MED ORDER — METOPROLOL TARTRATE 50 MG PO TABS
100.0000 mg | ORAL_TABLET | Freq: Two times a day (BID) | ORAL | Status: DC
  Administered 2016-09-05 – 2016-09-09 (×7): 100 mg via ORAL
  Filled 2016-09-05 (×8): qty 2

## 2016-09-05 MED ORDER — DEXTROSE-NACL 5-0.9 % IV SOLN
INTRAVENOUS | Status: DC
  Administered 2016-09-05 – 2016-09-06 (×2): via INTRAVENOUS

## 2016-09-05 MED ORDER — DIGOXIN 125 MCG PO TABS
125.0000 ug | ORAL_TABLET | Freq: Every morning | ORAL | Status: DC
  Administered 2016-09-05 – 2016-09-09 (×3): 125 ug via ORAL
  Filled 2016-09-05 (×4): qty 1

## 2016-09-05 MED ORDER — WARFARIN - PHARMACIST DOSING INPATIENT
Freq: Every day | Status: DC
  Administered 2016-09-05: 21:00:00

## 2016-09-05 MED ORDER — INSULIN ASPART 100 UNIT/ML ~~LOC~~ SOLN
0.0000 [IU] | SUBCUTANEOUS | Status: DC
  Administered 2016-09-06 – 2016-09-07 (×3): 1 [IU] via SUBCUTANEOUS
  Administered 2016-09-09: 2 [IU] via SUBCUTANEOUS
  Administered 2016-09-09 – 2016-09-10 (×2): 1 [IU] via SUBCUTANEOUS
  Administered 2016-09-10: 2 [IU] via SUBCUTANEOUS
  Administered 2016-09-10 – 2016-09-11 (×2): 1 [IU] via SUBCUTANEOUS

## 2016-09-05 MED ORDER — ATORVASTATIN CALCIUM 40 MG PO TABS
40.0000 mg | ORAL_TABLET | Freq: Every day | ORAL | Status: DC
  Administered 2016-09-05 – 2016-09-09 (×5): 40 mg via ORAL
  Filled 2016-09-05 (×6): qty 1

## 2016-09-05 MED ORDER — PIPERACILLIN-TAZOBACTAM 3.375 G IVPB
3.3750 g | Freq: Three times a day (TID) | INTRAVENOUS | Status: DC
  Administered 2016-09-05 – 2016-09-11 (×19): 3.375 g via INTRAVENOUS
  Filled 2016-09-05 (×17): qty 50

## 2016-09-05 MED ORDER — SULFASALAZINE 500 MG PO TBEC
500.0000 mg | DELAYED_RELEASE_TABLET | Freq: Two times a day (BID) | ORAL | Status: DC
  Administered 2016-09-05 – 2016-09-09 (×8): 500 mg via ORAL
  Filled 2016-09-05 (×19): qty 1

## 2016-09-05 MED ORDER — VITAMIN D 1000 UNITS PO TABS
1000.0000 [IU] | ORAL_TABLET | Freq: Every day | ORAL | Status: DC
  Administered 2016-09-05 – 2016-09-09 (×4): 1000 [IU] via ORAL
  Filled 2016-09-05 (×4): qty 1

## 2016-09-05 MED ORDER — VITAMIN B-12 1000 MCG PO TABS
1000.0000 ug | ORAL_TABLET | Freq: Every morning | ORAL | Status: DC
  Administered 2016-09-05 – 2016-09-09 (×4): 1000 ug via ORAL
  Filled 2016-09-05 (×3): qty 1

## 2016-09-05 MED ORDER — CHLORHEXIDINE GLUCONATE 0.12 % MT SOLN
15.0000 mL | Freq: Two times a day (BID) | OROMUCOSAL | Status: DC
  Administered 2016-09-05 – 2016-09-11 (×14): 15 mL via OROMUCOSAL
  Filled 2016-09-05 (×14): qty 15

## 2016-09-05 MED ORDER — MIRABEGRON ER 25 MG PO TB24
50.0000 mg | ORAL_TABLET | Freq: Every day | ORAL | Status: DC
  Administered 2016-09-05 – 2016-09-09 (×4): 50 mg via ORAL
  Filled 2016-09-05 (×5): qty 2
  Filled 2016-09-05: qty 1

## 2016-09-05 MED ORDER — VANCOMYCIN HCL IN DEXTROSE 750-5 MG/150ML-% IV SOLN
750.0000 mg | Freq: Two times a day (BID) | INTRAVENOUS | Status: DC
  Administered 2016-09-05 – 2016-09-06 (×2): 750 mg via INTRAVENOUS
  Filled 2016-09-05 (×5): qty 150

## 2016-09-05 MED ORDER — HALOPERIDOL 0.5 MG PO TABS
0.5000 mg | ORAL_TABLET | Freq: Two times a day (BID) | ORAL | Status: DC
  Administered 2016-09-05 – 2016-09-09 (×6): 0.5 mg via ORAL
  Filled 2016-09-05 (×20): qty 1

## 2016-09-05 NOTE — Progress Notes (Addendum)
PROGRESS NOTE    [name]  NWG:[id number] DOB: [birth year] DOA: 09/04/2016 PCP: Rudi HeapMOORE, DONALD, MD    Brief Narrative:  82 y.o. male with medical history significant of DM type 2, anxiety, GERD, and HTN presents to the ED with complaints of choking while eating   Assessment & Plan:   Principal Problem:   HCAP (healthcare-associated pneumonia) Active Problems:   Atrial fibrillation, persistent (HCC)   Memory deficits   Hospital acquired PNA   Pressure injury of skin   1. HCAP versus aspiration pneumonia 1. Patient had been continued on vancomycin and cefepime at time of admission 2. Given concerns of aspiration, will discontinue cefepime and change to Zosyn 3. Presently afebrile 4. No leukocytosis 5. Early on minimal O2 support at 2 L nasal cannula 6. Continue to wean O2 as tolerated 2. Persistent atrial fibrillation 1. Currently rate controlled. Patient is continued on Coumadin, dosing per pharmacy 2. H is continued on beta blocker 3. Continue Cardizem 4. Continue digoxin 3. Parkinson's disease 1. Patient presently lethargic, difficult to assess current state 2. Tremor not appreciated this morning 4. Normocytic anemia 1. Hemoglobin currently 9.0. Labs reviewed 5. Hypertension 1. Blood pressure stable. 2. We'll continue above medications as tolerated for now  DVT prophylaxis: Coumadin Code Status: Full Code Family Communication: Pt in room, family  Disposition Plan: Uncertain at this time  Consultants:    Procedures:     Antimicrobials: Anti-infectives    Start     Dose/Rate Route Frequency Ordered Stop   09/05/16 1400  vancomycin (VANCOCIN) IVPB 750 mg/150 ml premix     750 mg 150 mL/hr over 60 Minutes Intravenous Every 12 hours 09/05/16 0833     09/05/16 1115  piperacillin-tazobactam (ZOSYN) IVPB 3.375 g     3.375 g 12.5 mL/hr over 240 Minutes Intravenous Every 8 hours 09/05/16 1105     09/05/16 0145  vancomycin (VANCOCIN) IVPB 750 mg/150 ml premix      750 mg 150 mL/hr over 60 Minutes Intravenous  Once 09/05/16 0135 09/05/16 0454   09/05/16 0115  ceFEPIme (MAXIPIME) 1 g in dextrose 5 % 50 mL IVPB  Status:  Discontinued     1 g 100 mL/hr over 30 Minutes Intravenous Every 12 hours 09/05/16 0100 09/05/16 1101   09/04/16 2145  clindamycin (CLEOCIN) IVPB 600 mg     600 mg 100 mL/hr over 30 Minutes Intravenous  Once 09/04/16 2131 09/04/16 2253      Subjective: Unable to obtain at this time  Objective: Vitals:   09/04/16 2236 09/04/16 2300 09/05/16 0100 09/05/16 0603  BP:  111/69 120/77 97/64  Pulse:   (!) 136 97  Resp: 19 19 18 18   Temp:   98.9 F (37.2 C) 97.8 F (36.6 C)  TempSrc:   Oral Axillary  SpO2: 99% 99% 96% 98%  Weight:   61.7 kg (136 lb 0.4 oz)    No intake or output data in the 24 hours ending 09/05/16 1339 Filed Weights   09/05/16 0100  Weight: 61.7 kg (136 lb 0.4 oz)    Examination:  General exam: Appears calm and comfortable  Respiratory system: Clear to auscultation. Respiratory effort normal. Cardiovascular system: S1 & S2 heard, RRR Gastrointestinal system: Abdomen is nondistended, soft and nontender. Central nervous system: Lethargic, difficult to determine Extremities: Perfused, no clubbing Skin: Normal skin turgor, no notable skin lesions seen Psychiatry: Unable to determine given patient's current lethargy  Data Reviewed: I have personally reviewed following labs and imaging studies  CBC:  Recent Labs Lab 09/04/16 2000 09/05/16 0605  WBC 7.0 6.2  NEUTROABS 5.3  --   HGB 10.3* 9.0*  HCT 31.1* 27.3*  MCV 96.0 94.8  PLT 241 187   Basic Metabolic Panel:  Recent Labs Lab 09/04/16 2000 09/05/16 0605  NA 129* 128*  K 4.4 3.8  CL 92* 96*  CO2 29 29  GLUCOSE 154* 112*  BUN 18 14  CREATININE 0.45* 0.39*  CALCIUM 7.9* 7.4*   GFR: Estimated Creatinine Clearance: 61.1 mL/min (by C-G formula based on SCr of 0.39 mg/dL (L)). Liver Function Tests: No results for input(s): AST, ALT,  ALKPHOS, BILITOT, PROT, ALBUMIN in the last 168 hours. No results for input(s): LIPASE, AMYLASE in the last 168 hours. No results for input(s): AMMONIA in the last 168 hours. Coagulation Profile:  Recent Labs Lab 09/05/16 0808  INR 2.91   Cardiac Enzymes: No results for input(s): CKTOTAL, CKMB, CKMBINDEX, TROPONINI in the last 168 hours. BNP (last 3 results) No results for input(s): PROBNP in the last 8760 hours. HbA1C: No results for input(s): HGBA1C in the last 72 hours. CBG:  Recent Labs Lab 09/05/16 0148 09/05/16 0612 09/05/16 0744 09/05/16 1122  GLUCAP 95 107* 104* 97   Lipid Profile: No results for input(s): CHOL, HDL, LDLCALC, TRIG, CHOLHDL, LDLDIRECT in the last 72 hours. Thyroid Function Tests:  Recent Labs  09/05/16 0605  TSH 9.933*   Anemia Panel: No results for input(s): VITAMINB12, FOLATE, FERRITIN, TIBC, IRON, RETICCTPCT in the last 72 hours. Sepsis Labs:  Recent Labs Lab 09/04/16 2006 09/05/16 0808  LATICACIDVEN 2.71* 1.0    Recent Results (from the past 240 hour(s))  MRSA PCR Screening     Status: None   Collection Time: 09/05/16  1:27 AM  Result Value Ref Range Status   MRSA by PCR NEGATIVE NEGATIVE Final    Comment:        The GeneXpert MRSA Assay (FDA approved for NASAL specimens only), is one component of a comprehensive MRSA colonization surveillance program. It is not intended to diagnose MRSA infection nor to guide or monitor treatment for MRSA infections.      Radiology Studies: Dg Chest Port 1 View  Result Date: 09/04/2016 CLINICAL DATA:  Short of breath EXAM: PORTABLE CHEST 1 VIEW COMPARISON:  06/20/2016 FINDINGS: Cardiac enlargement with vascular congestion. Probable early pulmonary edema Right lower lobe airspace disease most likely atelectasis versus infiltrate. Possible right effusion. No effusion on the left IMPRESSION: Congestive heart failure with mild edema Right lower lobe density may represent atelectasis or  infiltrate. Possible right effusion also present. Electronically Signed   By: Marlan Palau M.D.   On: 09/04/2016 20:32    Scheduled Meds: . atorvastatin  40 mg Oral QHS  . chlorhexidine  15 mL Mouth Rinse BID  . cholecalciferol  1,000 Units Oral Daily  . digoxin  125 mcg Oral q morning - 10a  . haloperidol  0.5 mg Oral BID  . insulin aspart  0-9 Units Subcutaneous Q4H  . mouth rinse  15 mL Mouth Rinse q12n4p  . metoprolol  100 mg Oral BID  . mirabegron ER  50 mg Oral Daily  . piperacillin-tazobactam (ZOSYN)  IV  3.375 g Intravenous Q8H  . sodium chloride flush  3 mL Intravenous Q12H  . sulfaSALAzine  500 mg Oral BID  . vancomycin  750 mg Intravenous Q12H  . vitamin B-12  1,000 mcg Oral q morning - 10a  . warfarin  4 mg Oral Once  .  Warfarin - Pharmacist Dosing Inpatient   Does not apply q1800   Continuous Infusions: . dextrose 5 % and 0.9% NaCl 50 mL/hr at 09/05/16 0153     LOS: 1 day   CHIU, Scheryl Marten, MD [facility] Pager [phone]  If 7PM-7AM, please contact night-coverage [email or web address] Password [email or web address] 09/05/2016, 1:39 PM

## 2016-09-05 NOTE — Progress Notes (Signed)
ANTICOAGULATION CONSULT NOTE - Initial Consult  Pharmacy Consult for coumadin Indication: atrial fibrillation  Allergies  Allergen Reactions  . Asa-Apap-Salicyl-Caff     *Takes warfarin*  . Caffeine     Unknown reaction  . Codeine Other (See Comments)    Unknown reaction  . Nsaids Other (See Comments)    *Due to taking Warfarin*  . Sulfonamide Derivatives Nausea And Vomiting    Patient Measurements: Weight: 136 lb 0.4 oz (61.7 kg)   Vital Signs: Temp: 97.8 F (36.6 C) (10/14 0603) Temp Source: Axillary (10/14 0603) BP: 97/64 (10/14 0603) Pulse Rate: 97 (10/14 0603)  Labs:  Recent Labs  09/04/16 2000 09/05/16 0605 09/05/16 0808  HGB 10.3* 9.0*  --   HCT 31.1* 27.3*  --   PLT 241 187  --   LABPROT  --   --  31.0*  INR  --   --  2.91  CREATININE 0.45* 0.39*  --     Estimated Creatinine Clearance: 61.1 mL/min (by C-G formula based on SCr of 0.39 mg/dL (L)).   Medical History: Past Medical History:  Diagnosis Date  . Anxiety   . Atrial fibrillation, persistent (HCC)   . BPH (benign prostatic hyperplasia)   . CAD (coronary artery disease)    Anterior MI and DES stenting 2004, Vfib arrest; left main 25% stenosis, LAD 80% stenosis, 99% stenosis, 25% circumflex stenosis, 25% ramus intermedius stenosis, 50-40% right coronary artery stenosis, 90% RV branch stenosis with right to left collaterals; EF 55% with mild naterior hypokinesis (underwent Taxus stenting at the time) . Stress perfusion study January 2012 EF 48% with old apical infarct  . Cataract   . Diabetes mellitus   . Diverticulosis   . Dyslipidemia   . Essential and other specified forms of tremor 05/05/2013  . GERD (gastroesophageal reflux disease)   . GIB (gastrointestinal bleeding)   . Hearing deficit    Wears hearing aids  . Hemorrhoids   . Hyperlipidemia   . Hyperplastic colon polyp   . Hypertension   . Memory deficits 05/05/2013  . Myocardial infarction   . Obesity   . Stroke (HCC)   . Tremor    . Tremor, essential 12/11/2015  . UC (ulcerative colitis confined to rectum) (HCC)    Left-sided    Medications:  Prescriptions Prior to Admission  Medication Sig Dispense Refill Last Dose  . Amino Acids-Protein Hydrolys (FEEDING SUPPLEMENT, PRO-STAT SUGAR FREE 64,) LIQD Take 30 mLs by mouth 2 (two) times daily.   09/04/2016 at Unknown time  . atorvastatin (LIPITOR) 40 MG tablet TAKE ONE TABLET AT BEDTIME 30 tablet 4 09/03/2016 at Unknown time  . Cholecalciferol (VITAMIN D) 1000 UNITS capsule Take 1 capsule (1,000 Units total) by mouth daily.   09/04/2016 at Unknown time  . digoxin (LANOXIN) 0.125 MG tablet TAKE 1 TABLET IN THE MORNING 30 tablet 4 09/04/2016 at Unknown time  . haloperidol (HALDOL) 0.5 MG tablet Take 0.5 mg by mouth 2 (two) times daily.   09/04/2016 at 800a  . hydrOXYzine (ATARAX/VISTARIL) 10 MG tablet Take one-half tablet daily as needed (Patient taking differently: Take 5 mg by mouth daily as needed for itching. ) 15 tablet 0 unknown  . metoprolol (LOPRESSOR) 100 MG tablet TAKE 1 & 1/2 TABLETS TWICE A DAY (Patient taking differently: TAKE 1 TABLET TWICE A DAY) 135 tablet 1 09/04/2016 at 800a  . mirabegron ER (MYRBETRIQ) 25 MG TB24 tablet Take 1 tablet (25 mg total) by mouth daily. (Patient taking differently: Take 50  mg by mouth daily. ) 28 tablet 0 09/04/2016 at Unknown time  . Multiple Vitamin (MULTIVITAMIN WITH MINERALS) TABS tablet Take 1 tablet by mouth daily. 30 tablet 11 09/04/2016 at Unknown time  . nitroGLYCERIN (NITROSTAT) 0.4 MG SL tablet Place 1 tablet (0.4 mg total) under the tongue every 5 (five) minutes as needed for chest pain. 25 tablet 1 unknown  . sulfaSALAzine (AZULFIDINE) 500 MG EC tablet Take 1 tablet (500 mg total) by mouth 2 (two) times daily. Take 500 mg twice a day by mouth for 1 week, then 1000 mg BID (Patient taking differently: Take 500 mg by mouth 2 (two) times daily. ) 60 tablet 0 09/04/2016 at 800a  . vitamin B-12 (CYANOCOBALAMIN) 1000 MCG tablet  TAKE 1 TABLET IN THE MORNING 30 tablet 4 09/04/2016 at Unknown time  . warfarin (COUMADIN) 2 MG tablet Take 4.5 mg by mouth every evening.   09/04/2016 at 1700    Assessment: 82 yo man admitted after choking on food.  He will continue coumadin for afib. Admission INR is 2.91.  Hg low at 9.0, no bleeding reported  Goal of Therapy:  INR 2-3 Monitor platelets by anticoagulation protocol: Yes   Plan:  Coumadin 4mg  po today Daily PT/INR Monitor for bleeding complications  Roselynne Lortz Poteet 09/05/2016,10:32 AM

## 2016-09-05 NOTE — Progress Notes (Addendum)
ANTIBIOTIC CONSULT NOTE  Pharmacy Consult for Vancomycin and zosyn Indication: Pneumonia  Allergies  Allergen Reactions  . Asa-Apap-Salicyl-Caff     *Takes warfarin*  . Caffeine     Unknown reaction  . Codeine Other (See Comments)    Unknown reaction  . Nsaids Other (See Comments)    *Due to taking Warfarin*  . Sulfonamide Derivatives Nausea And Vomiting    Patient Measurements: Weight: 136 lb 0.4 oz (61.7 kg)  Vital Signs: Temp: 98.9 F (37.2 C) (10/14 0100) Temp Source: Oral (10/14 0100) BP: 120/77 (10/14 0100) Pulse Rate: 136 (10/14 0100)  Labs:  Recent Labs  09/04/16 2000  WBC 7.0  HGB 10.3*  PLT 241  CREATININE 0.45*    Estimated Creatinine Clearance: 61.1 mL/min (by C-G formula based on SCr of 0.45 mg/dL (L)).  No results for input(s): VANCOTROUGH, VANCOPEAK, VANCORANDOM, GENTTROUGH, GENTPEAK, GENTRANDOM, TOBRATROUGH, TOBRAPEAK, TOBRARND, AMIKACINPEAK, AMIKACINTROU, AMIKACIN in the last 72 hours.   Microbiology: No results found for this or any previous visit (from the past 720 hour(s)).  Medical History: Past Medical History:  Diagnosis Date  . Anxiety   . Atrial fibrillation, persistent (HCC)   . BPH (benign prostatic hyperplasia)   . CAD (coronary artery disease)    Anterior MI and DES stenting 2004, Vfib arrest; left main 25% stenosis, LAD 80% stenosis, 99% stenosis, 25% circumflex stenosis, 25% ramus intermedius stenosis, 50-40% right coronary artery stenosis, 90% RV branch stenosis with right to left collaterals; EF 55% with mild naterior hypokinesis (underwent Taxus stenting at the time) . Stress perfusion study January 2012 EF 48% with old apical infarct  . Cataract   . Diabetes mellitus   . Diverticulosis   . Dyslipidemia   . Essential and other specified forms of tremor 05/05/2013  . GERD (gastroesophageal reflux disease)   . GIB (gastrointestinal bleeding)   . Hearing deficit    Wears hearing aids  . Hemorrhoids   . Hyperlipidemia   .  Hyperplastic colon polyp   . Hypertension   . Memory deficits 05/05/2013  . Myocardial infarction   . Obesity   . Stroke (HCC)   . Tremor   . Tremor, essential 12/11/2015  . UC (ulcerative colitis confined to rectum) (HCC)    Left-sided    Medications:  Cefepime 1 Gm IV in the ED Clindamycin 600 mg in the ED  Assessment: 82 yo male nursing home resident admitted for respiratory distress after choking.. Pt to be treated empirically for possible aspiration pneumonia.  Goal of Therapy:  Vancomycin troughs 15-20 mcg/ml  Plan:  Preliminary review of pertinent patient information completed.  Protocol will be initiated with a one-time dose of vancomycin 750 mg IV.  Jeani HawkingAnnie Penn clinical pharmacist will complete review during morning rounds to assess patient and finalize treatment regimen.  Arelia SneddonMason, Mary Anne, RPH 09/05/2016,1:38 AM   Addum:  Cont vancomycin 500 mg IV q12 hours Change cefepime to zosyn 3.375 gm IV q8 hours F/u renal function, cultures and clinical course Talbert CageLora Vella Colquitt, PharmD

## 2016-09-06 LAB — CBC
HEMATOCRIT: 29.2 % — AB (ref 39.0–52.0)
HEMOGLOBIN: 9.6 g/dL — AB (ref 13.0–17.0)
MCH: 31.6 pg (ref 26.0–34.0)
MCHC: 32.9 g/dL (ref 30.0–36.0)
MCV: 96.1 fL (ref 78.0–100.0)
Platelets: 202 10*3/uL (ref 150–400)
RBC: 3.04 MIL/uL — AB (ref 4.22–5.81)
RDW: 13.9 % (ref 11.5–15.5)
WBC: 6.5 10*3/uL (ref 4.0–10.5)

## 2016-09-06 LAB — GLUCOSE, CAPILLARY
GLUCOSE-CAPILLARY: 103 mg/dL — AB (ref 65–99)
Glucose-Capillary: 104 mg/dL — ABNORMAL HIGH (ref 65–99)
Glucose-Capillary: 103 mg/dL — ABNORMAL HIGH (ref 65–99)
Glucose-Capillary: 128 mg/dL — ABNORMAL HIGH (ref 65–99)
Glucose-Capillary: 172 mg/dL — ABNORMAL HIGH (ref 65–99)

## 2016-09-06 LAB — BASIC METABOLIC PANEL
ANION GAP: 3 — AB (ref 5–15)
BUN: 12 mg/dL (ref 6–20)
CHLORIDE: 96 mmol/L — AB (ref 101–111)
CO2: 30 mmol/L (ref 22–32)
Calcium: 7.7 mg/dL — ABNORMAL LOW (ref 8.9–10.3)
Creatinine, Ser: 0.43 mg/dL — ABNORMAL LOW (ref 0.61–1.24)
Glucose, Bld: 106 mg/dL — ABNORMAL HIGH (ref 65–99)
POTASSIUM: 3.8 mmol/L (ref 3.5–5.1)
Sodium: 129 mmol/L — ABNORMAL LOW (ref 135–145)

## 2016-09-06 LAB — PROTIME-INR
INR: 2.93
Prothrombin Time: 31.2 seconds — ABNORMAL HIGH (ref 11.4–15.2)

## 2016-09-06 MED ORDER — MORPHINE SULFATE (PF) 2 MG/ML IV SOLN: 1.0000 mg | INTRAVENOUS | Status: DC | PRN

## 2016-09-06 MED ORDER — WARFARIN SODIUM 2 MG PO TABS
2.0000 mg | ORAL_TABLET | Freq: Once | ORAL | Status: AC
  Administered 2016-09-06: 2 mg via ORAL
  Filled 2016-09-06: qty 1

## 2016-09-06 NOTE — Progress Notes (Signed)
ANTICOAGULATION CONSULT NOTE  Pharmacy Consult for coumadin Indication: atrial fibrillation  Allergies  Allergen Reactions  . Asa-Apap-Salicyl-Caff     *Takes warfarin*  . Caffeine     Unknown reaction  . Codeine Other (See Comments)    Unknown reaction  . Nsaids Other (See Comments)    *Due to taking Warfarin*  . Sulfonamide Derivatives Nausea And Vomiting    Patient Measurements: Height: 5\' 6"  (167.6 cm) Weight: 136 lb 0.4 oz (61.7 kg) IBW/kg (Calculated) : 63.8   Vital Signs: Temp: 97.5 F (36.4 C) (10/15 0518) Temp Source: Oral (10/15 0518) BP: 97/52 (10/15 0518) Pulse Rate: 92 (10/15 0518)  Labs:  Recent Labs  09/04/16 2000 09/05/16 0605 09/05/16 0808 09/06/16 0623 09/06/16 0815  HGB 10.3* 9.0*  --  9.6*  --   HCT 31.1* 27.3*  --  29.2*  --   PLT 241 187  --  202  --   LABPROT  --   --  31.0*  --  31.2*  INR  --   --  2.91  --  2.93  CREATININE 0.45* 0.39*  --  0.43*  --     Estimated Creatinine Clearance: 61.1 mL/min (by C-G formula based on SCr of 0.43 mg/dL (L)).   Medical History: Past Medical History:  Diagnosis Date  . Anxiety   . Atrial fibrillation, persistent (HCC)   . BPH (benign prostatic hyperplasia)   . CAD (coronary artery disease)    Anterior MI and DES stenting 2004, Vfib arrest; left main 25% stenosis, LAD 80% stenosis, 99% stenosis, 25% circumflex stenosis, 25% ramus intermedius stenosis, 50-40% right coronary artery stenosis, 90% RV branch stenosis with right to left collaterals; EF 55% with mild naterior hypokinesis (underwent Taxus stenting at the time) . Stress perfusion study January 2012 EF 48% with old apical infarct  . Cataract   . Diabetes mellitus   . Diverticulosis   . Dyslipidemia   . Essential and other specified forms of tremor 05/05/2013  . GERD (gastroesophageal reflux disease)   . GIB (gastrointestinal bleeding)   . Hearing deficit    Wears hearing aids  . Hemorrhoids   . Hyperlipidemia   . Hyperplastic colon  polyp   . Hypertension   . Memory deficits 05/05/2013  . Myocardial infarction   . Obesity   . Stroke (HCC)   . Tremor   . Tremor, essential 12/11/2015  . UC (ulcerative colitis confined to rectum) (HCC)    Left-sided    Medications:  Prescriptions Prior to Admission  Medication Sig Dispense Refill Last Dose  . Amino Acids-Protein Hydrolys (FEEDING SUPPLEMENT, PRO-STAT SUGAR FREE 64,) LIQD Take 30 mLs by mouth 2 (two) times daily.   09/04/2016 at Unknown time  . atorvastatin (LIPITOR) 40 MG tablet TAKE ONE TABLET AT BEDTIME 30 tablet 4 09/03/2016 at Unknown time  . Cholecalciferol (VITAMIN D) 1000 UNITS capsule Take 1 capsule (1,000 Units total) by mouth daily.   09/04/2016 at Unknown time  . digoxin (LANOXIN) 0.125 MG tablet TAKE 1 TABLET IN THE MORNING 30 tablet 4 09/04/2016 at Unknown time  . haloperidol (HALDOL) 0.5 MG tablet Take 0.5 mg by mouth 2 (two) times daily.   09/04/2016 at 800a  . hydrOXYzine (ATARAX/VISTARIL) 10 MG tablet Take one-half tablet daily as needed (Patient taking differently: Take 5 mg by mouth daily as needed for itching. ) 15 tablet 0 unknown  . metoprolol (LOPRESSOR) 100 MG tablet TAKE 1 & 1/2 TABLETS TWICE A DAY (Patient taking differently: TAKE  1 TABLET TWICE A DAY) 135 tablet 1 09/04/2016 at 800a  . mirabegron ER (MYRBETRIQ) 25 MG TB24 tablet Take 1 tablet (25 mg total) by mouth daily. (Patient taking differently: Take 50 mg by mouth daily. ) 28 tablet 0 09/04/2016 at Unknown time  . Multiple Vitamin (MULTIVITAMIN WITH MINERALS) TABS tablet Take 1 tablet by mouth daily. 30 tablet 11 09/04/2016 at Unknown time  . nitroGLYCERIN (NITROSTAT) 0.4 MG SL tablet Place 1 tablet (0.4 mg total) under the tongue every 5 (five) minutes as needed for chest pain. 25 tablet 1 unknown  . sulfaSALAzine (AZULFIDINE) 500 MG EC tablet Take 1 tablet (500 mg total) by mouth 2 (two) times daily. Take 500 mg twice a day by mouth for 1 week, then 1000 mg BID (Patient taking differently:  Take 500 mg by mouth 2 (two) times daily. ) 60 tablet 0 09/04/2016 at 800a  . vitamin B-12 (CYANOCOBALAMIN) 1000 MCG tablet TAKE 1 TABLET IN THE MORNING 30 tablet 4 09/04/2016 at Unknown time  . warfarin (COUMADIN) 2 MG tablet Take 4.5 mg by mouth every evening.   09/04/2016 at 1700    Assessment: 82 yo man admitted after choking on food.  He will continue coumadin for afib. INR 2.93 today. Goal of Therapy:  INR 2-3 Monitor platelets by anticoagulation protocol: Yes   Plan:  Coumadin 2mg  po today Daily PT/INR Monitor for bleeding complications  Alaia Lordi Poteet 09/06/2016,12:20 PM

## 2016-09-06 NOTE — Progress Notes (Signed)
PROGRESS NOTE    [name] D [facility]  EAV:[id number] DOB: 12-10-1929 DOA: 09/04/2016 PCP: Rudi HeapMOORE, DONALD, MD    Brief Narrative:  82 y.o. male with medical history significant of DM type 2, anxiety, GERD, and HTN presents to the ED with complaints of choking while eating   Assessment & Plan:   Principal Problem:   HCAP (healthcare-associated pneumonia) Active Problems:   Atrial fibrillation, persistent (HCC)   Memory deficits   Hospital acquired PNA   Pressure injury of skin   1. HCAP versus aspiration pneumonia 1. For now, patient is continued on vancomycin and Zosyn 2. No leukocytosis or fevers overnight 3. MRSA nasal swab is negative, thus MRSA pneumonia is unlikely 4. We'll discontinue vancomycin 2. Persistent atrial fibrillation 1. Remains rate controlled 2. For now continue Cardizem, digoxin, beta blocker 3. Patient is continued on Coumadin 4. CHADS-VASc of 6 3. Parkinson's disease 1. Occasional resting tremors are noted 2. Patient is conversant 3. We'll continue home regimen for the time being 4. Patient is complaining of pain. Will start on analgesics as needed 4. Normocytic anemia 1. Hemoglobin remains stable and is currently 9.6 5. Hypertension 1. Blood pressure has remained stable at this time  DVT prophylaxis: Coumadin Code Status: Full Code Family Communication: Pt in room, family  Disposition Plan: Uncertain at this time  Consultants:    Procedures:     Antimicrobials: Anti-infectives    Start     Dose/Rate Route Frequency Ordered Stop   09/05/16 1400  vancomycin (VANCOCIN) IVPB 750 mg/150 ml premix     750 mg 150 mL/hr over 60 Minutes Intravenous Every 12 hours 09/05/16 0833     09/05/16 1115  piperacillin-tazobactam (ZOSYN) IVPB 3.375 g     3.375 g 12.5 mL/hr over 240 Minutes Intravenous Every 8 hours 09/05/16 1105     09/05/16 0145  vancomycin (VANCOCIN) IVPB 750 mg/150 ml premix     750 mg 150 mL/hr over 60 Minutes Intravenous  Once  09/05/16 0135 09/05/16 0454   09/05/16 0115  ceFEPIme (MAXIPIME) 1 g in dextrose 5 % 50 mL IVPB  Status:  Discontinued     1 g 100 mL/hr over 30 Minutes Intravenous Every 12 hours 09/05/16 0100 09/05/16 1101   09/04/16 2145  clindamycin (CLEOCIN) IVPB 600 mg     600 mg 100 mL/hr over 30 Minutes Intravenous  Once 09/04/16 2131 09/04/16 2253      Subjective: Complains of being in pain  Objective: Vitals:   09/05/16 1339 09/05/16 2024 09/06/16 0518 09/06/16 1300  BP: 104/68 122/62 (!) 97/52 (!) 108/51  Pulse: 87 89 92 86  Resp: 18 18 20 18   Temp: 98.1 F (36.7 C) 98.5 F (36.9 C) 97.5 F (36.4 C) 97.8 F (36.6 C)  TempSrc: Oral Oral Oral Oral  SpO2: 99% 97% 97% 96%  Weight:      Height:   5\' 6"  (1.676 m)     Intake/Output Summary (Last 24 hours) at 09/06/16 1549 Last data filed at 09/06/16 1200  Gross per 24 hour  Intake              120 ml  Output                0 ml  Net              120 ml   Filed Weights   09/05/16 0100  Weight: 61.7 kg (136 lb 0.4 oz)    Examination:  General exam: Lying on side,  appears mildly uncomfortable, conversant Respiratory system: Normal respiratory effort, no audible wheezing. Cardiovascular system: Regular rate, S1-S2 Gastrointestinal system: Soft, positive bowel sounds Central nervous system: CN II through XII grossly intact, sensation intact  Extremities: No cyanosis, no joint deformities Skin: No pallor, no rashes Psychiatry: Mood normal, no auditory hallucinations  Data Reviewed: I have personally reviewed following labs and imaging studies  CBC:  Recent Labs Lab 09/04/16 2000 09/05/16 0605 09/06/16 0623  WBC 7.0 6.2 6.5  NEUTROABS 5.3  --   --   HGB 10.3* 9.0* 9.6*  HCT 31.1* 27.3* 29.2*  MCV 96.0 94.8 96.1  PLT 241 187 202   Basic Metabolic Panel:  Recent Labs Lab 09/04/16 2000 09/05/16 0605 09/06/16 0623  NA 129* 128* 129*  K 4.4 3.8 3.8  CL 92* 96* 96*  CO2 29 29 30   GLUCOSE 154* 112* 106*  BUN 18  14 12   CREATININE 0.45* 0.39* 0.43*  CALCIUM 7.9* 7.4* 7.7*   GFR: Estimated Creatinine Clearance: 61.1 mL/min (by C-G formula based on SCr of 0.43 mg/dL (L)). Liver Function Tests: No results for input(s): AST, ALT, ALKPHOS, BILITOT, PROT, ALBUMIN in the last 168 hours. No results for input(s): LIPASE, AMYLASE in the last 168 hours. No results for input(s): AMMONIA in the last 168 hours. Coagulation Profile:  Recent Labs Lab 09/05/16 0808 09/06/16 0815  INR 2.91 2.93   Cardiac Enzymes: No results for input(s): CKTOTAL, CKMB, CKMBINDEX, TROPONINI in the last 168 hours. BNP (last 3 results) No results for input(s): PROBNP in the last 8760 hours. HbA1C: No results for input(s): HGBA1C in the last 72 hours. CBG:  Recent Labs Lab 09/05/16 1604 09/05/16 2021 09/06/16 0700 09/06/16 0715 09/06/16 1130  GLUCAP 95 100* 104* 103* 103*   Lipid Profile: No results for input(s): CHOL, HDL, LDLCALC, TRIG, CHOLHDL, LDLDIRECT in the last 72 hours. Thyroid Function Tests:  Recent Labs  09/05/16 0605  TSH 9.933*   Anemia Panel: No results for input(s): VITAMINB12, FOLATE, FERRITIN, TIBC, IRON, RETICCTPCT in the last 72 hours. Sepsis Labs:  Recent Labs Lab 09/04/16 2006 09/05/16 0808  LATICACIDVEN 2.71* 1.0    Recent Results (from the past 240 hour(s))  MRSA PCR Screening     Status: None   Collection Time: 09/05/16  1:27 AM  Result Value Ref Range Status   MRSA by PCR NEGATIVE NEGATIVE Final    Comment:        The GeneXpert MRSA Assay (FDA approved for NASAL specimens only), is one component of a comprehensive MRSA colonization surveillance program. It is not intended to diagnose MRSA infection nor to guide or monitor treatment for MRSA infections.      Radiology Studies: Dg Chest Port 1 View  Result Date: 09/04/2016 CLINICAL DATA:  Short of breath EXAM: PORTABLE CHEST 1 VIEW COMPARISON:  06/20/2016 FINDINGS: Cardiac enlargement with vascular congestion.  Probable early pulmonary edema Right lower lobe airspace disease most likely atelectasis versus infiltrate. Possible right effusion. No effusion on the left IMPRESSION: Congestive heart failure with mild edema Right lower lobe density may represent atelectasis or infiltrate. Possible right effusion also present. Electronically Signed   By: Marlan Palau M.D.   On: 09/04/2016 20:32    Scheduled Meds: . atorvastatin  40 mg Oral QHS  . chlorhexidine  15 mL Mouth Rinse BID  . cholecalciferol  1,000 Units Oral Daily  . digoxin  125 mcg Oral q morning - 10a  . haloperidol  0.5 mg Oral BID  . insulin  aspart  0-9 Units Subcutaneous Q4H  . mouth rinse  15 mL Mouth Rinse q12n4p  . metoprolol  100 mg Oral BID  . mirabegron ER  50 mg Oral Daily  . piperacillin-tazobactam (ZOSYN)  IV  3.375 g Intravenous Q8H  . sodium chloride flush  3 mL Intravenous Q12H  . sulfaSALAzine  500 mg Oral BID  . vancomycin  750 mg Intravenous Q12H  . vitamin B-12  1,000 mcg Oral q morning - 10a  . warfarin  2 mg Oral Once  . Warfarin - Pharmacist Dosing Inpatient   Does not apply q1800   Continuous Infusions: . dextrose 5 % and 0.9% NaCl 50 mL/hr at 09/06/16 1039     LOS: 2 days   Makinzi Prieur, Scheryl Marten, MD [facility] Pager [phone]  If 7PM-7AM, please contact night-coverage [email or web address] Password [email or web address] 09/06/2016, 3:49 PM

## 2016-09-06 NOTE — Progress Notes (Signed)
Spoke w/weekend nurse at [facility].  She stated that pt is normally on a mechanical soft diet and that she had noticed him having increased difficulty swallowing a couple of weeks ago.  The choking event which brought pt to the ED involved food brought by family from outside (possibly popcorn).

## 2016-09-06 NOTE — Evaluation (Signed)
Clinical/Bedside Swallow Evaluation Patient Details  Name: [name] MRN: [id number] Date of Birth: 1930/02/01  Today's Date: 09/06/2016 Time: SLP Start Time (ACUTE ONLY): 1340 SLP Stop Time (ACUTE ONLY): 1410 SLP Time Calculation (min) (ACUTE ONLY): 30 min  Past Medical History:  Past Medical History:  Diagnosis Date  . Anxiety   . Atrial fibrillation, persistent (HCC)   . BPH (benign prostatic hyperplasia)   . CAD (coronary artery disease)    Anterior MI and DES stenting 2004, Vfib arrest; left main 25% stenosis, LAD 80% stenosis, 99% stenosis, 25% circumflex stenosis, 25% ramus intermedius stenosis, 50-40% right coronary artery stenosis, 90% RV branch stenosis with right to left collaterals; EF 55% with mild naterior hypokinesis (underwent Taxus stenting at the time) . Stress perfusion study January 2012 EF 48% with old apical infarct  . Cataract   . Diabetes mellitus   . Diverticulosis   . Dyslipidemia   . Essential and other specified forms of tremor 05/05/2013  . GERD (gastroesophageal reflux disease)   . GIB (gastrointestinal bleeding)   . Hearing deficit    Wears hearing aids  . Hemorrhoids   . Hyperlipidemia   . Hyperplastic Gregory polyp   . Hypertension   . Memory deficits 05/05/2013  . Myocardial infarction   . Obesity   . Stroke (HCC)   . Tremor   . Tremor, essential 12/11/2015  . UC (ulcerative colitis confined to rectum) (HCC)    Left-sided   Past Surgical History:  Past Surgical History:  Procedure Laterality Date  . CATARACT EXTRACTION W/PHACO  10/27/2012   Procedure: CATARACT EXTRACTION PHACO AND INTRAOCULAR LENS PLACEMENT (IOC);  Surgeon: Gemma Payor, MD;  Location: [facility];  Service: Ophthalmology;  Laterality: Left;  CDE 22.67  . CORONARY ANGIOPLASTY WITH STENT PLACEMENT     Taxus stenting  . HAMMER TOE SURGERY     right  . INGUINAL HERNIA REPAIR     right, bilateral   HPI:  Gregory Reihl Rumleyis a 82 y.o.malewith medical history significant of DM  type 2, anxiety, GERD, and HTN presents to the ED with complaints of choking while eating that occurred earlier this evening.  CXR shows CHF with mild edema along with right lower lobe density may represent atelectasis or infiltrate.   Assessment / Plan / Recommendation Clinical Impression  Pt was evaluated at bedside presenting with mild oropharyngeal dysphagia. Pt was verbal, alert and responsive to all questions asked although he "did not know" the answers to many questions. Pt was admitted after a choking episode (further clarification from nurse at LTC facility reveals pt was being fed by family, possibly popcorn) and has experienced increased difficulty with diet. CXR reveals right lower lobe density that may represent atelectasis or infiltrate. Note MBS completed 01/2016 revealing delayed oral transit and mild delay in swallow trigger (triggering after filling valleculae and spilling to pyriforms at times), however no penetration, aspiration, or significant residuals noted. Recommendation was made for regular/thin textures and meds to be crushed in puree. Furthermore "Of note, pt had moderate sized Zenker's diverticulum (confirmed by radiologist) which did fill and cause some retro-movement of bolus back into pharynx which pt then swallowed again to clear.  ENT consult was recommended but if there was follow up is currently unknown. Today, pt consumed thin liquids demonstrating occasional wet vocal quality which was cleared with an additional dry swallow (pt required mod/max cueing to do so). Mild oral residue was noted with regular textures although mastication was thorough. Pt consumed puree  textures with no overt s/sx of aspiration. Recommend D2/ground textures and thin liquids; meds to be crushed in puree. (in the case that D2 is not available at secondary facility treating therapist may recommend downgrade to puree). Recommend avoiding popcorn, cornbread, nuts and seeds (d/t dysphagia and unknown  resolution to Zenker's diverticulum). ST to follow while in acute setting.    Aspiration Risk  Mild aspiration risk;Moderate aspiration risk    Diet Recommendation Dysphagia 2 (Fine chop);Thin liquid   Liquid Administration via: Cup;Straw Medication Administration: Crushed with puree Supervision: Full supervision/cueing for compensatory strategies Compensations: Slow rate;Small sips/bites;Multiple dry swallows after each bite/sip;Follow solids with liquid Postural Changes: Seated upright at 90 degrees    Other  Recommendations Recommended Consults: Consider ENT evaluation Oral Care Recommendations: Oral care BID   Follow up Recommendations Skilled Nursing facility      Frequency and Duration min 2x/week  1 week       Prognosis Prognosis for Safe Diet Advancement: Guarded      Swallow Study   General Date of Onset: 09/04/16 HPI: Gregory Flatteryaul D Rumleyis a 82 y.o.malewith medical history significant of DM type 2, anxiety, GERD, and HTN presents to the ED with complaints of choking while eating that occurred earlier this evening. Patient is unresponsive and is unable to give a history. CXR shows CHF with mild edema along with right lower lobe density may represent atelectasis or infiltrate. Type of Study: Bedside Swallow Evaluation Previous Swallow Assessment: MBS Diet Prior to this Study: NPO Temperature Spikes Noted: No Respiratory Status: Nasal cannula History of Recent Intubation: No Behavior/Cognition: Alert;Cooperative;Pleasant mood Oral Cavity Assessment: Within Functional Limits;Dry Oral Cavity - Dentition: Adequate natural dentition Vision: Functional for self-feeding Self-Feeding Abilities: Total assist Patient Positioning: Upright in bed Baseline Vocal Quality: Normal Volitional Cough: Weak Volitional Swallow: Able to elicit    Oral/Motor/Sensory Function Overall Oral Motor/Sensory Function: Within functional limits   Ice Chips Ice chips: Within functional limits    Thin Liquid Thin Liquid: Impaired Presentation: Cup;Spoon;Straw Oral Phase Functional Implications: Left anterior spillage (d/t positioning) Pharyngeal  Phase Impairments: Suspected delayed Swallow;Multiple swallows;Wet Vocal Quality    Nectar Thick     Honey Thick     Puree Puree: Within functional limits   Solid      Bao Coreas H. Gregory LeveeYarbrough MA, CCC-SLP Speech Language Pathologist  Solid: Impaired Oral Phase Impairments: Reduced lingual movement/coordination Oral Phase Functional Implications: Oral residue Pharyngeal Phase Impairments: Multiple swallows        Annamarie Yamaguchi H [name] 09/06/2016,2:16 PM

## 2016-09-07 ENCOUNTER — Inpatient Hospital Stay (HOSPITAL_COMMUNITY): Payer: Medicare Other

## 2016-09-07 DIAGNOSIS — E43 Unspecified severe protein-calorie malnutrition: Secondary | ICD-10-CM | POA: Insufficient documentation

## 2016-09-07 DIAGNOSIS — J690 Pneumonitis due to inhalation of food and vomit: Secondary | ICD-10-CM

## 2016-09-07 LAB — GLUCOSE, CAPILLARY
GLUCOSE-CAPILLARY: 109 mg/dL — AB (ref 65–99)
GLUCOSE-CAPILLARY: 128 mg/dL — AB (ref 65–99)
GLUCOSE-CAPILLARY: 140 mg/dL — AB (ref 65–99)
GLUCOSE-CAPILLARY: 93 mg/dL (ref 65–99)
Glucose-Capillary: 103 mg/dL — ABNORMAL HIGH (ref 65–99)
Glucose-Capillary: 126 mg/dL — ABNORMAL HIGH (ref 65–99)

## 2016-09-07 LAB — PROTIME-INR
INR: 2.84
Prothrombin Time: 30.5 seconds — ABNORMAL HIGH (ref 11.4–15.2)

## 2016-09-07 LAB — BASIC METABOLIC PANEL
ANION GAP: 3 — AB (ref 5–15)
BUN: 10 mg/dL (ref 6–20)
CO2: 30 mmol/L (ref 22–32)
Calcium: 7.5 mg/dL — ABNORMAL LOW (ref 8.9–10.3)
Chloride: 95 mmol/L — ABNORMAL LOW (ref 101–111)
Creatinine, Ser: 0.49 mg/dL — ABNORMAL LOW (ref 0.61–1.24)
GFR calc Af Amer: >60 mL/min (ref >60)
Glucose, Bld: 97 mg/dL (ref 65–99)
POTASSIUM: 3.8 mmol/L (ref 3.5–5.1)
SODIUM: 128 mmol/L — AB (ref 135–145)

## 2016-09-07 MED ORDER — ADULT MULTIVITAMIN W/MINERALS CH
1.0000 | ORAL_TABLET | Freq: Every day | ORAL | Status: DC
  Administered 2016-09-07 – 2016-09-09 (×3): 1 via ORAL
  Filled 2016-09-07 (×3): qty 1

## 2016-09-07 MED ORDER — ENSURE ENLIVE PO LIQD
237.0000 mL | Freq: Two times a day (BID) | ORAL | Status: DC
  Administered 2016-09-07 – 2016-09-10 (×4): 237 mL via ORAL

## 2016-09-07 MED ORDER — WARFARIN SODIUM 2 MG PO TABS
2.0000 mg | ORAL_TABLET | Freq: Once | ORAL | Status: AC
  Administered 2016-09-07: 2 mg via ORAL
  Filled 2016-09-07: qty 1

## 2016-09-07 MED ORDER — PRO-STAT SUGAR FREE PO LIQD
30.0000 mL | Freq: Two times a day (BID) | ORAL | Status: DC
  Administered 2016-09-07: 30 mL via ORAL
  Administered 2016-09-07: 23:00:00 via ORAL
  Administered 2016-09-08 – 2016-09-11 (×5): 30 mL via ORAL
  Filled 2016-09-07 (×8): qty 30

## 2016-09-07 MED ORDER — FUROSEMIDE 10 MG/ML IJ SOLN
40.0000 mg | Freq: Once | INTRAMUSCULAR | Status: DC
  Filled 2016-09-07: qty 4

## 2016-09-07 MED ORDER — WARFARIN - PHARMACIST DOSING INPATIENT
Status: DC
  Administered 2016-09-08 – 2016-09-10 (×3)

## 2016-09-07 NOTE — Progress Notes (Signed)
Speech Language Pathology Treatment:   Dysphagia Patient Details Name: [name] MRN: [id number] DOB: 1930-04-20 Today's Date: 09/07/2016 Time:  -     Assessment / Plan / Recommendation Clinical Impression  Pt seen at bedside to assess for diet tolerance. Pt observed with thin and nectars via straw sip. No observable difference between the two consistencies, however pt does have a mild wheeze. SLP heard from treating SLP at [facility] who reported recent diet change to puree and NTL. Consider MBSS while in acute setting to assess need for that diet. SLP will follow up tomorrow.   HPI HPI: [name] a 82 y.o.malewith medical history significant of DM type 2, anxiety, GERD, and HTN presents to the ED with complaints of choking while eating that occurred earlier this evening. Patient is unresponsive and is unable to give a history. CXR shows CHF with mild edema along with right lower lobe density may represent atelectasis or infiltrate.      SLP Plan  Continue with current plan of care;MBS     Recommendations  Liquids provided via: Cup;Straw Medication Administration: Crushed with puree Compensations: Slow rate;Small sips/bites;Multiple dry swallows after each bite/sip;Follow solids with liquid                Oral Care Recommendations: Oral care BID Plan: Continue with current plan of care;MBS       Thank you,  Havery MorosDabney Porter, CCC-SLP [phone]                 PORTER,DABNEY 09/07/2016, 7:03 PM

## 2016-09-07 NOTE — Care Management Important Message (Signed)
Important Message  Patient Details  Name: [name] MRN: [id number] Date of Birth: [birth year]   Medicare Important Message Given:  Yes    Maxcine Strong, Chrystine OilerSharley Diane, RN 09/07/2016, 10:17 AM

## 2016-09-07 NOTE — Progress Notes (Signed)
ANTICOAGULATION CONSULT NOTE  Pharmacy Consult for coumadin Indication: atrial fibrillation  Allergies  Allergen Reactions  . Asa-Apap-Salicyl-Caff     *Takes warfarin*  . Caffeine     Unknown reaction  . Codeine Other (See Comments)    Unknown reaction  . Nsaids Other (See Comments)    *Due to taking Warfarin*  . Sulfonamide Derivatives Nausea And Vomiting   Patient Measurements: Height: 5\' 6"  (167.6 cm) Weight: 136 lb 0.4 oz (61.7 kg) IBW/kg (Calculated) : 63.8  Vital Signs: Temp: 98.6 F (37 C) (10/16 0511) Temp Source: Oral (10/16 0511) BP: 110/80 (10/16 0511) Pulse Rate: 95 (10/16 0511)  Labs:  Recent Labs  09/04/16 2000 09/05/16 0605 09/05/16 0808 09/06/16 0623 09/06/16 0815 09/07/16 0635  HGB 10.3* 9.0*  --  9.6*  --   --   HCT 31.1* 27.3*  --  29.2*  --   --   PLT 241 187  --  202  --   --   LABPROT  --   --  31.0*  --  31.2* 30.5*  INR  --   --  2.91  --  2.93 2.84  CREATININE 0.45* 0.39*  --  0.43*  --  0.49*   Estimated Creatinine Clearance: 61.1 mL/min (by C-G formula based on SCr of 0.49 mg/dL (L)).  Medical History: Past Medical History:  Diagnosis Date  . Anxiety   . Atrial fibrillation, persistent (HCC)   . BPH (benign prostatic hyperplasia)   . CAD (coronary artery disease)    Anterior MI and DES stenting 2004, Vfib arrest; left main 25% stenosis, LAD 80% stenosis, 99% stenosis, 25% circumflex stenosis, 25% ramus intermedius stenosis, 50-40% right coronary artery stenosis, 90% RV branch stenosis with right to left collaterals; EF 55% with mild naterior hypokinesis (underwent Taxus stenting at the time) . Stress perfusion study January 2012 EF 48% with old apical infarct  . Cataract   . Diabetes mellitus   . Diverticulosis   . Dyslipidemia   . Essential and other specified forms of tremor 05/05/2013  . GERD (gastroesophageal reflux disease)   . GIB (gastrointestinal bleeding)   . Hearing deficit    Wears hearing aids  . Hemorrhoids   .  Hyperlipidemia   . Hyperplastic colon polyp   . Hypertension   . Memory deficits 05/05/2013  . Myocardial infarction   . Obesity   . Stroke (HCC)   . Tremor   . Tremor, essential 12/11/2015  . UC (ulcerative colitis confined to rectum) (HCC)    Left-sided   Medications:  Prescriptions Prior to Admission  Medication Sig Dispense Refill Last Dose  . Amino Acids-Protein Hydrolys (FEEDING SUPPLEMENT, PRO-STAT SUGAR FREE 64,) LIQD Take 30 mLs by mouth 2 (two) times daily.   09/04/2016 at Unknown time  . atorvastatin (LIPITOR) 40 MG tablet TAKE ONE TABLET AT BEDTIME 30 tablet 4 09/03/2016 at Unknown time  . Cholecalciferol (VITAMIN D) 1000 UNITS capsule Take 1 capsule (1,000 Units total) by mouth daily.   09/04/2016 at Unknown time  . digoxin (LANOXIN) 0.125 MG tablet TAKE 1 TABLET IN THE MORNING 30 tablet 4 09/04/2016 at Unknown time  . haloperidol (HALDOL) 0.5 MG tablet Take 0.5 mg by mouth 2 (two) times daily.   09/04/2016 at 800a  . hydrOXYzine (ATARAX/VISTARIL) 10 MG tablet Take one-half tablet daily as needed (Patient taking differently: Take 5 mg by mouth daily as needed for itching. ) 15 tablet 0 unknown  . metoprolol (LOPRESSOR) 100 MG tablet TAKE 1 &  1/2 TABLETS TWICE A DAY (Patient taking differently: TAKE 1 TABLET TWICE A DAY) 135 tablet 1 09/04/2016 at 800a  . mirabegron ER (MYRBETRIQ) 25 MG TB24 tablet Take 1 tablet (25 mg total) by mouth daily. (Patient taking differently: Take 50 mg by mouth daily. ) 28 tablet 0 09/04/2016 at Unknown time  . Multiple Vitamin (MULTIVITAMIN WITH MINERALS) TABS tablet Take 1 tablet by mouth daily. 30 tablet 11 09/04/2016 at Unknown time  . nitroGLYCERIN (NITROSTAT) 0.4 MG SL tablet Place 1 tablet (0.4 mg total) under the tongue every 5 (five) minutes as needed for chest pain. 25 tablet 1 unknown  . sulfaSALAzine (AZULFIDINE) 500 MG EC tablet Take 1 tablet (500 mg total) by mouth 2 (two) times daily. Take 500 mg twice a day by mouth for 1 week, then 1000  mg BID (Patient taking differently: Take 500 mg by mouth 2 (two) times daily. ) 60 tablet 0 09/04/2016 at 800a  . vitamin B-12 (CYANOCOBALAMIN) 1000 MCG tablet TAKE 1 TABLET IN THE MORNING 30 tablet 4 09/04/2016 at Unknown time  . warfarin (COUMADIN) 2 MG tablet Take 4.5 mg by mouth every evening.   09/04/2016 at 1700   Assessment: 82 yo man admitted after choking on food.  He will continue coumadin for afib. INR 2.93 today.  Goal of Therapy:  INR 2-3 Monitor platelets by anticoagulation protocol: Yes   Plan:  Repeat Coumadin 2mg  po today Daily PT/INR Monitor for bleeding complications  Mady GemmaHayes, Nivek Powley R 09/07/2016,10:46 AM

## 2016-09-07 NOTE — Progress Notes (Signed)
Initial Nutrition Assessment  DOCUMENTATION CODES:   Severe malnutrition in context of chronic illness  INTERVENTION:  Ensure Enlive po BID, each supplement provides 350 kcal and 20 grams of protein   ProStat 30 ml BID (each 30 ml provides 100 kcal, 15 gr protein)   Assist with feeding all meals and supplements  Add MVI daily   NUTRITION DIAGNOSIS:   Malnutrition related to chronic illness, dysphagia, wound healing as evidenced by estimated needs, meal completion < 50%, severe depletion of muscle mass, severe depletion of body fat.  GOAL:   Patient will meet greater than or equal to 90% of their needs   MONITOR:  Skin assessments, Po intake, labs and wt trends     REASON FOR ASSESSMENT:   Low Braden    ASSESSMENT:  Patient presents from SNF with complaint of swallow difficulty (choking on his food). He had a MBSS back in March 2017.  ST evaluated on 10/15- findings mild oropharyngeal dysphagia and D2 diet with thin liquids recommended. He is unable to feed himself at this time (nursing is assisting to maximize his intake). His appetite is poor 0-50% of documented meals. He has multiple areas of skin breakdown.  10/16- chest x-ray findings- increasing right-side pleural effusion He has severe weight loss in the past 2 months (20%) and severe muscle and fat depletions. Fluid accumulation BLE moderate pitting edema.  Recent Labs Lab 09/05/16 0605 09/06/16 0623 09/07/16 0635  NA 128* 129* 128*  K 3.8 3.8 3.8  CL 96* 96* 95*  CO2 29 30 30   BUN 14 12 10   CREATININE 0.39* 0.43* 0.49*  CALCIUM 7.4* 7.7* 7.5*  GLUCOSE 112* 106* 97   Labs: sodium 128  Meds: Lasix, insulin, Vitamin D, Vitamin B-12, Coumadin  Diet Order:  DIET DYS 2 Room service appropriate? Yes; Fluid consistency: Thin  Skin:    UN- pressure injuries to coccyx and right heel, stage 2 to penis and DTI to right hip   Last BM:    10/14-small   Height:   Ht Readings from Last 1 Encounters:  09/06/16  5\' 6"  (1.676 m)    Weight:   Wt Readings from Last 1 Encounters:  09/05/16 136 lb 0.4 oz (61.7 kg)    Ideal Body Weight:  65 kg  BMI:  Body mass index is 20.09 kg/m.  Estimated Nutritional Needs:   Kcal:  1860-2170   Protein:  90-105 gr  Fluid:  1.9-2.1 liters daily  EDUCATION NEEDS:   No education needs identified at this time  Royann ShiversLynn Danaiya Steadman MS,RD,CSG,LDN Office: #[phone] Pager: [phone]

## 2016-09-07 NOTE — Progress Notes (Signed)
BP 84/49. MD notified and aware. BP medication and IV lasix held. No fluids orders. RN will continue to monitor Lesly Dukesachel J Everett, RN

## 2016-09-07 NOTE — Progress Notes (Signed)
PROGRESS NOTE    [name]  WUJ:[id number] DOB: 13-Nov-1930 DOA: 09/04/2016 PCP: Rudi Heap, MD    Brief Narrative:  82 y.o. male with medical history significant of DM type 2, anxiety, GERD, and HTN presents to the ED with complaints of choking while eating   Assessment & Plan:   Principal Problem:   HCAP (healthcare-associated pneumonia) Active Problems:   Atrial fibrillation, persistent (HCC)   Memory deficits   Hospital acquired PNA   Pressure injury of skin  1. HCAP versus aspiration pneumonia 1. Patient continued on Zosyn monotherapy 2. MRSA swab negative 3. No leukocytosis no fevers 2. Persistent atrial fibrillation 1. Patient currently rate controlled 2. Patient continued on Cardizem, digoxin, beta blocker 3. Continue Coumadin per pharmacy dosing. 4. CHADS-VASc of 6 3. Parkinson's disease 1. Resting tremors noted 2. Patient is conversant 3. Seems stable at this time 4. Normocytic anemia 1. Hemoglobin had remained stable 2. No evidence of acute bleed 5. Hypertension 1. Blood pressure currently stable 2. The current regimen for the time being  DVT prophylaxis: Coumadin Code Status: Full Code Family Communication: Pt in room, family  Disposition Plan: Uncertain at this time  Consultants:    Procedures:     Antimicrobials: Anti-infectives    Start     Dose/Rate Route Frequency Ordered Stop   09/05/16 1400  vancomycin (VANCOCIN) IVPB 750 mg/150 ml premix  Status:  Discontinued     750 mg 150 mL/hr over 60 Minutes Intravenous Every 12 hours 09/05/16 0833 09/06/16 1550   09/05/16 1115  piperacillin-tazobactam (ZOSYN) IVPB 3.375 g     3.375 g 12.5 mL/hr over 240 Minutes Intravenous Every 8 hours 09/05/16 1105     09/05/16 0145  vancomycin (VANCOCIN) IVPB 750 mg/150 ml premix     750 mg 150 mL/hr over 60 Minutes Intravenous  Once 09/05/16 0135 09/05/16 0454   09/05/16 0115  ceFEPIme (MAXIPIME) 1 g in dextrose 5 % 50 mL IVPB  Status:  Discontinued      1 g 100 mL/hr over 30 Minutes Intravenous Every 12 hours 09/05/16 0100 09/05/16 1101   09/04/16 2145  clindamycin (CLEOCIN) IVPB 600 mg     600 mg 100 mL/hr over 30 Minutes Intravenous  Once 09/04/16 2131 09/04/16 2253      Subjective: Without complaints at this time  Objective: Vitals:   09/06/16 1300 09/06/16 2130 09/07/16 0511 09/07/16 1248  BP: (!) 108/51 118/77 110/80 104/70  Pulse: 86 96 95 94  Resp: 18 18 18 18   Temp: 97.8 F (36.6 C) 98.6 F (37 C) 98.6 F (37 C)   TempSrc: Oral Oral Oral   SpO2: 96% 97% 95%   Weight:      Height:        Intake/Output Summary (Last 24 hours) at 09/07/16 1731 Last data filed at 09/07/16 0955  Gross per 24 hour  Intake           1922.5 ml  Output              200 ml  Net           1722.5 ml   Filed Weights   09/05/16 0100  Weight: 61.7 kg (136 lb 0.4 oz)    Examination:  General exam: Lying in bed, awake, conversant Respiratory system: Normal respiratory effort, no audible wheezing. Cardiovascular system: Regular rate, S1-S2 Gastrointestinal system: Soft, positive bowel sounds Central nervous system: CN II through XII grossly intact, sensation intact  Extremities: No cyanosis, no  joint deformities Skin: No pallor, no rashes Psychiatry: Mood normal, no auditory hallucinations  Data Reviewed: I have personally reviewed following labs and imaging studies  CBC:  Recent Labs Lab 09/04/16 2000 09/05/16 0605 09/06/16 0623  WBC 7.0 6.2 6.5  NEUTROABS 5.3  --   --   HGB 10.3* 9.0* 9.6*  HCT 31.1* 27.3* 29.2*  MCV 96.0 94.8 96.1  PLT 241 187 202   Basic Metabolic Panel:  Recent Labs Lab 09/04/16 2000 09/05/16 0605 09/06/16 0623 09/07/16 0635  NA 129* 128* 129* 128*  K 4.4 3.8 3.8 3.8  CL 92* 96* 96* 95*  CO2 29 29 30 30   GLUCOSE 154* 112* 106* 97  BUN 18 14 12 10   CREATININE 0.45* 0.39* 0.43* 0.49*  CALCIUM 7.9* 7.4* 7.7* 7.5*   GFR: Estimated Creatinine Clearance: 61.1 mL/min (by C-G formula based  on SCr of 0.49 mg/dL (L)). Liver Function Tests: No results for input(s): AST, ALT, ALKPHOS, BILITOT, PROT, ALBUMIN in the last 168 hours. No results for input(s): LIPASE, AMYLASE in the last 168 hours. No results for input(s): AMMONIA in the last 168 hours. Coagulation Profile:  Recent Labs Lab 09/05/16 0808 09/06/16 0815 09/07/16 0635  INR 2.91 2.93 2.84   Cardiac Enzymes: No results for input(s): CKTOTAL, CKMB, CKMBINDEX, TROPONINI in the last 168 hours. BNP (last 3 results) No results for input(s): PROBNP in the last 8760 hours. HbA1C: No results for input(s): HGBA1C in the last 72 hours. CBG:  Recent Labs Lab 09/07/16 0004 09/07/16 0417 09/07/16 0740 09/07/16 1139 09/07/16 1614  GLUCAP 128* 93 103* 126* 140*   Lipid Profile: No results for input(s): CHOL, HDL, LDLCALC, TRIG, CHOLHDL, LDLDIRECT in the last 72 hours. Thyroid Function Tests:  Recent Labs  09/05/16 0605  TSH 9.933*   Anemia Panel: No results for input(s): VITAMINB12, FOLATE, FERRITIN, TIBC, IRON, RETICCTPCT in the last 72 hours. Sepsis Labs:  Recent Labs Lab 09/04/16 2006 09/05/16 0808  LATICACIDVEN 2.71* 1.0    Recent Results (from the past 240 hour(s))  MRSA PCR Screening     Status: None   Collection Time: 09/05/16  1:27 AM  Result Value Ref Range Status   MRSA by PCR NEGATIVE NEGATIVE Final    Comment:        The GeneXpert MRSA Assay (FDA approved for NASAL specimens only), is one component of a comprehensive MRSA colonization surveillance program. It is not intended to diagnose MRSA infection nor to guide or monitor treatment for MRSA infections.      Radiology Studies: Dg Chest Port 1 View  Result Date: 09/07/2016 CLINICAL DATA:  CHF EXAM: PORTABLE CHEST 1 VIEW COMPARISON:  09/04/2016 FINDINGS: Cardiac shadow is again enlarged in size. Increasing right-sided pleural effusion is noted. There is likely underlying basilar atelectasis on the right. Left lung remains clear.  The degree of vascular congestion has improved somewhat in the interval from the prior exam. No acute bony abnormality is seen. IMPRESSION: Increasing right-sided pleural effusion. Electronically Signed   By: Alcide Clever M.D.   On: 09/07/2016 14:22    Scheduled Meds: . atorvastatin  40 mg Oral QHS  . chlorhexidine  15 mL Mouth Rinse BID  . cholecalciferol  1,000 Units Oral Daily  . digoxin  125 mcg Oral q morning - 10a  . feeding supplement (ENSURE ENLIVE)  237 mL Oral BID BM  . feeding supplement (PRO-STAT SUGAR FREE 64)  30 mL Oral BID  . furosemide  40 mg Intravenous Once  .  haloperidol  0.5 mg Oral BID  . insulin aspart  0-9 Units Subcutaneous Q4H  . mouth rinse  15 mL Mouth Rinse q12n4p  . metoprolol  100 mg Oral BID  . mirabegron ER  50 mg Oral Daily  . multivitamin with minerals  1 tablet Oral Daily  . piperacillin-tazobactam (ZOSYN)  IV  3.375 g Intravenous Q8H  . sodium chloride flush  3 mL Intravenous Q12H  . sulfaSALAzine  500 mg Oral BID  . vitamin B-12  1,000 mcg Oral q morning - 10a  . [START ON 09/08/2016] Warfarin - Pharmacist Dosing Inpatient   Does not apply Q24H   Continuous Infusions:     LOS: 3 days   [name], Scheryl MartenSTEPHEN K, MD [facility] Pager [phone]  If 7PM-7AM, please contact night-coverage [email or web address] Password [email or web address] 09/07/2016, 5:31 PM

## 2016-09-07 NOTE — Care Management Note (Signed)
Case Management Note  Patient Details  Name: Elray Mcgregoraul D Hovatter MRN: [id number] Date of Birth: [birth year]  Subjective/Objective:  Patient adm from SNF with HCAP. Plans to return back to SNF at discharge.                   Action/Plan: CSW are and working on arrangments for return to SNF.   Expected Discharge Date:        09/08/2016       Expected Discharge Plan:  Skilled Nursing Facility  In-House Referral:  Clinical Social Work  Discharge planning Services  CM Consult  Post Acute Care Choice:    Choice offered to:     DME Arranged:    DME Agency:     HH Arranged:    HH Agency:     Status of Service:  In process, will continue to follow  If discussed at Long Length of Stay Meetings, dates discussed:    Additional Comments:  Inetha Maret, Chrystine OilerSharley Diane, RN 09/07/2016, 10:16 AM

## 2016-09-08 DIAGNOSIS — I5023 Acute on chronic systolic (congestive) heart failure: Secondary | ICD-10-CM

## 2016-09-08 DIAGNOSIS — R1312 Dysphagia, oropharyngeal phase: Secondary | ICD-10-CM

## 2016-09-08 DIAGNOSIS — E43 Unspecified severe protein-calorie malnutrition: Secondary | ICD-10-CM

## 2016-09-08 DIAGNOSIS — J690 Pneumonitis due to inhalation of food and vomit: Principal | ICD-10-CM

## 2016-09-08 DIAGNOSIS — J189 Pneumonia, unspecified organism: Secondary | ICD-10-CM

## 2016-09-08 DIAGNOSIS — I4891 Unspecified atrial fibrillation: Secondary | ICD-10-CM

## 2016-09-08 LAB — BASIC METABOLIC PANEL
Anion gap: 6 (ref 5–15)
BUN: 14 mg/dL (ref 6–20)
CALCIUM: 7.6 mg/dL — AB (ref 8.9–10.3)
CO2: 28 mmol/L (ref 22–32)
CREATININE: 0.54 mg/dL — AB (ref 0.61–1.24)
Chloride: 95 mmol/L — ABNORMAL LOW (ref 101–111)
Glucose, Bld: 108 mg/dL — ABNORMAL HIGH (ref 65–99)
Potassium: 4 mmol/L (ref 3.5–5.1)
SODIUM: 129 mmol/L — AB (ref 135–145)

## 2016-09-08 LAB — GLUCOSE, CAPILLARY
GLUCOSE-CAPILLARY: 115 mg/dL — AB (ref 65–99)
GLUCOSE-CAPILLARY: 103 mg/dL — AB (ref 65–99)
GLUCOSE-CAPILLARY: 114 mg/dL — AB (ref 65–99)
GLUCOSE-CAPILLARY: 115 mg/dL — AB (ref 65–99)
Glucose-Capillary: 123 mg/dL — ABNORMAL HIGH (ref 65–99)
Glucose-Capillary: 126 mg/dL — ABNORMAL HIGH (ref 65–99)

## 2016-09-08 LAB — PROTIME-INR
INR: 2.62
PROTHROMBIN TIME: 28.5 s — AB (ref 11.4–15.2)

## 2016-09-08 LAB — MAGNESIUM: Magnesium: 1.2 mg/dL — ABNORMAL LOW (ref 1.7–2.4)

## 2016-09-08 MED ORDER — MAGNESIUM SULFATE 4 GM/100ML IV SOLN
4.0000 g | Freq: Once | INTRAVENOUS | Status: AC
  Administered 2016-09-08: 4 g via INTRAVENOUS
  Filled 2016-09-08: qty 100

## 2016-09-08 MED ORDER — FUROSEMIDE 10 MG/ML IJ SOLN
40.0000 mg | Freq: Once | INTRAMUSCULAR | Status: AC
Start: 2016-09-08 — End: 2016-09-08
  Administered 2016-09-08: 40 mg via INTRAVENOUS
  Filled 2016-09-08: qty 4

## 2016-09-08 MED ORDER — AMIODARONE LOAD VIA INFUSION
150.0000 mg | Freq: Once | INTRAVENOUS | Status: AC
  Administered 2016-09-08: 150 mg via INTRAVENOUS
  Filled 2016-09-08: qty 83.34

## 2016-09-08 MED ORDER — AMIODARONE HCL IN DEXTROSE 360-4.14 MG/200ML-% IV SOLN
60.0000 mg/h | INTRAVENOUS | Status: AC
  Administered 2016-09-08 (×2): 60 mg/h via INTRAVENOUS
  Filled 2016-09-08 (×2): qty 200

## 2016-09-08 MED ORDER — WARFARIN SODIUM 2 MG PO TABS
2.0000 mg | ORAL_TABLET | Freq: Once | ORAL | Status: DC
  Filled 2016-09-08: qty 1

## 2016-09-08 MED ORDER — AMIODARONE HCL IN DEXTROSE 360-4.14 MG/200ML-% IV SOLN
30.0000 mg/h | INTRAVENOUS | Status: DC
  Administered 2016-09-08: 30 mg/h via INTRAVENOUS
  Filled 2016-09-08 (×3): qty 200

## 2016-09-08 MED ORDER — LABETALOL HCL 5 MG/ML IV SOLN: 5.0000 mg | INTRAVENOUS | Status: DC | PRN

## 2016-09-08 NOTE — Progress Notes (Signed)
Pt transferred to stepdown department per Dr. Rhona Leavenshiu.  Pt's IV site patent.  Pt currently resting.  Report called to Lyman BishopLawrence, Charity fundraiserN.  Verbalized understanding.  Pt transported via hospital bed in stable condition accompanied by hospital staff.

## 2016-09-08 NOTE — Treatment Plan (Signed)
Patient seen. Full note will follow. Patient noted to have afib RVR this AM with sustained HR into the 140-160's with soft sbp, noted to be just over 100, unable to give cardizem or beta blocker at this time. Will start on amiodarone gtt and transfer to SDU. Discussed case with Cardiology who will see in consultation.

## 2016-09-08 NOTE — Progress Notes (Signed)
ANTICOAGULATION CONSULT NOTE  Pharmacy Consult for coumadin Indication: atrial fibrillation  Allergies  Allergen Reactions  . Asa-Apap-Salicyl-Caff     *Takes warfarin*  . Caffeine     Unknown reaction  . Codeine Other (See Comments)    Unknown reaction  . Nsaids Other (See Comments)    *Due to taking Warfarin*  . Sulfonamide Derivatives Nausea And Vomiting   Patient Measurements: Height: 5\' 6"  (167.6 cm) Weight: 136 lb 0.4 oz (61.7 kg) IBW/kg (Calculated) : 63.8  Vital Signs: Temp: 98 F (36.7 C) (10/17 0527) Temp Source: Oral (10/17 0527) BP: 119/58 (10/17 0527) Pulse Rate: 114 (10/17 0527)  Labs:  Recent Labs  09/06/16 [id number] 09/06/16 0815 09/07/16 0635 09/08/16 0606  HGB 9.6*  --   --   --   HCT 29.2*  --   --   --   PLT 202  --   --   --   LABPROT  --  31.2* 30.5* 28.5*  INR  --  2.93 2.84 2.62  CREATININE 0.43*  --  0.49* 0.54*   Estimated Creatinine Clearance: 61.1 mL/min (by C-G formula based on SCr of 0.54 mg/dL (L)).  Medical History: Past Medical History:  Diagnosis Date  . Anxiety   . Atrial fibrillation, persistent (HCC)   . BPH (benign prostatic hyperplasia)   . CAD (coronary artery disease)    Anterior MI and DES stenting 2004, Vfib arrest; left main 25% stenosis, LAD 80% stenosis, 99% stenosis, 25% circumflex stenosis, 25% ramus intermedius stenosis, 50-40% right coronary artery stenosis, 90% RV branch stenosis with right to left collaterals; EF 55% with mild naterior hypokinesis (underwent Taxus stenting at the time) . Stress perfusion study January 2012 EF 48% with old apical infarct  . Cataract   . Diabetes mellitus   . Diverticulosis   . Dyslipidemia   . Essential and other specified forms of tremor 05/05/2013  . GERD (gastroesophageal reflux disease)   . GIB (gastrointestinal bleeding)   . Hearing deficit    Wears hearing aids  . Hemorrhoids   . Hyperlipidemia   . Hyperplastic colon polyp   . Hypertension   . Memory deficits  05/05/2013  . Myocardial infarction   . Obesity   . Stroke (HCC)   . Tremor   . Tremor, essential 12/11/2015  . UC (ulcerative colitis confined to rectum) (HCC)    Left-sided   Medications:  Prescriptions Prior to Admission  Medication Sig Dispense Refill Last Dose  . Amino Acids-Protein Hydrolys (FEEDING SUPPLEMENT, PRO-STAT SUGAR FREE 64,) LIQD Take 30 mLs by mouth 2 (two) times daily.   09/04/2016 at Unknown time  . atorvastatin (LIPITOR) 40 MG tablet TAKE ONE TABLET AT BEDTIME 30 tablet 4 09/03/2016 at Unknown time  . Cholecalciferol (VITAMIN D) 1000 UNITS capsule Take 1 capsule (1,000 Units total) by mouth daily.   09/04/2016 at Unknown time  . digoxin (LANOXIN) 0.125 MG tablet TAKE 1 TABLET IN THE MORNING 30 tablet 4 09/04/2016 at Unknown time  . haloperidol (HALDOL) 0.5 MG tablet Take 0.5 mg by mouth 2 (two) times daily.   09/04/2016 at 800a  . hydrOXYzine (ATARAX/VISTARIL) 10 MG tablet Take one-half tablet daily as needed (Patient taking differently: Take 5 mg by mouth daily as needed for itching. ) 15 tablet 0 unknown  . metoprolol (LOPRESSOR) 100 MG tablet TAKE 1 & 1/2 TABLETS TWICE A DAY (Patient taking differently: TAKE 1 TABLET TWICE A DAY) 135 tablet 1 09/04/2016 at 800a  . mirabegron ER (MYRBETRIQ) 25  MG TB24 tablet Take 1 tablet (25 mg total) by mouth daily. (Patient taking differently: Take 50 mg by mouth daily. ) 28 tablet 0 09/04/2016 at Unknown time  . Multiple Vitamin (MULTIVITAMIN WITH MINERALS) TABS tablet Take 1 tablet by mouth daily. 30 tablet 11 09/04/2016 at Unknown time  . nitroGLYCERIN (NITROSTAT) 0.4 MG SL tablet Place 1 tablet (0.4 mg total) under the tongue every 5 (five) minutes as needed for chest pain. 25 tablet 1 unknown  . sulfaSALAzine (AZULFIDINE) 500 MG EC tablet Take 1 tablet (500 mg total) by mouth 2 (two) times daily. Take 500 mg twice a day by mouth for 1 week, then 1000 mg BID (Patient taking differently: Take 500 mg by mouth 2 (two) times daily. ) 60  tablet 0 09/04/2016 at 800a  . vitamin B-12 (CYANOCOBALAMIN) 1000 MCG tablet TAKE 1 TABLET IN THE MORNING 30 tablet 4 09/04/2016 at Unknown time  . warfarin (COUMADIN) 2 MG tablet Take 4.5 mg by mouth every evening.   09/04/2016 at 1700   Assessment: 82 yo man admitted after choking on food.  He will continue coumadin for afib. INR therapeutic at 2.62 today.  Goal of Therapy:  INR 2-3 Monitor platelets by anticoagulation protocol: Yes   Plan:  Repeat Coumadin 2mg  po today Daily PT/INR Monitor for bleeding complications  Valrie Hart A 09/08/2016,10:18 AM

## 2016-09-08 NOTE — Progress Notes (Signed)
Speech Language Pathology Treatment: Dysphagia  Patient Details Name: [name] MRN: [id number] DOB: 1930/09/06 Today's Date: 09/08/2016 Time: 2000-2030 SLP Time Calculation (min) (ACUTE ONLY): 30 min  Assessment / Plan / Recommendation Clinical Impression  [name] was moved to ICU earlier today due to afib and was not alert all day. SLP stopped by his room this evening and he was alert and hungry. Oral care completed and pt requested "orange juice". Pt consumed water, orange juice, chocolate Ensure, and 4 oz applesauce with occasional oral holding, but no overt signs/symptoms of decreased airway protection. Mild wet vocal quality x1 which cleared with SLP cue to clear throat and swallow. Will downgrade textures to D1/puree and thin until pt appropriate for MBSS. RN aware of plan of care.   HPI HPI: [name] a 82 y.o.malewith medical history significant of DM type 2, anxiety, GERD, and HTN presents to the ED with complaints of choking while eating that occurred earlier this evening. Patient is unresponsive and is unable to give a history. CXR shows CHF with mild edema along with right lower lobe density may represent atelectasis or infiltrate.      SLP Plan  Continue with current plan of care;MBS     Recommendations  Diet recommendations: Dysphagia 1 (puree);Thin liquid Liquids provided via: Cup;Straw Medication Administration: Crushed with puree Supervision: Staff to assist with self feeding;Full supervision/cueing for compensatory strategies Compensations: Slow rate;Small sips/bites;Multiple dry swallows after each bite/sip;Follow solids with liquid Postural Changes and/or Swallow Maneuvers: Seated upright 90 degrees;Upright 30-60 min after meal                Oral Care Recommendations: Oral care BID;Staff/trained caregiver to provide oral care Follow up Recommendations: Skilled Nursing facility Plan: Continue with current plan of care;MBS       Thank  you,  Havery MorosDabney Porter, CCC-SLP [phone]                 PORTER,DABNEY 09/08/2016, 9:00 PM

## 2016-09-08 NOTE — Progress Notes (Signed)
ANTIBIOTIC CONSULT NOTE  Pharmacy Consult for Zosyn Indication: Pneumonia  Allergies  Allergen Reactions  . Asa-Apap-Salicyl-Caff     *Takes warfarin*  . Caffeine     Unknown reaction  . Codeine Other (See Comments)    Unknown reaction  . Nsaids Other (See Comments)    *Due to taking Warfarin*  . Sulfonamide Derivatives Nausea And Vomiting   Patient Measurements: Height: 5\' 6"  (167.6 cm) Weight: 136 lb 0.4 oz (61.7 kg) IBW/kg (Calculated) : 63.8  Vital Signs: Temp: 98 F (36.7 C) (10/17 0527) Temp Source: Oral (10/17 0527) BP: 119/58 (10/17 0527) Pulse Rate: 114 (10/17 0527)  Labs:  Recent Labs  09/06/16 0623 09/07/16 0635 09/08/16 0606  WBC 6.5  --   --   HGB 9.6*  --   --   PLT 202  --   --   CREATININE 0.43* 0.49* 0.54*    Estimated Creatinine Clearance: 61.1 mL/min (by C-G formula based on SCr of 0.54 mg/dL (L)).  No results for input(s): VANCOTROUGH, VANCOPEAK, VANCORANDOM, GENTTROUGH, GENTPEAK, GENTRANDOM, TOBRATROUGH, TOBRAPEAK, TOBRARND, AMIKACINPEAK, AMIKACINTROU, AMIKACIN in the last 72 hours.   Microbiology: Recent Results (from the past 720 hour(s))  MRSA PCR Screening     Status: None   Collection Time: 09/05/16  1:27 AM  Result Value Ref Range Status   MRSA by PCR NEGATIVE NEGATIVE Final    Comment:        The GeneXpert MRSA Assay (FDA approved for NASAL specimens only), is one component of a comprehensive MRSA colonization surveillance program. It is not intended to diagnose MRSA infection nor to guide or monitor treatment for MRSA infections.     Medical History: Past Medical History:  Diagnosis Date  . Anxiety   . Atrial fibrillation, persistent (HCC)   . BPH (benign prostatic hyperplasia)   . CAD (coronary artery disease)    Anterior MI and DES stenting 2004, Vfib arrest; left main 25% stenosis, LAD 80% stenosis, 99% stenosis, 25% circumflex stenosis, 25% ramus intermedius stenosis, 50-40% right coronary artery stenosis, 90% RV  branch stenosis with right to left collaterals; EF 55% with mild naterior hypokinesis (underwent Taxus stenting at the time) . Stress perfusion study January 2012 EF 48% with old apical infarct  . Cataract   . Diabetes mellitus   . Diverticulosis   . Dyslipidemia   . Essential and other specified forms of tremor 05/05/2013  . GERD (gastroesophageal reflux disease)   . GIB (gastrointestinal bleeding)   . Hearing deficit    Wears hearing aids  . Hemorrhoids   . Hyperlipidemia   . Hyperplastic colon polyp   . Hypertension   . Memory deficits 05/05/2013  . Myocardial infarction   . Obesity   . Stroke (HCC)   . Tremor   . Tremor, essential 12/11/2015  . UC (ulcerative colitis confined to rectum) (HCC)    Left-sided   Anti-infectives    Start     Dose/Rate Route Frequency Ordered Stop   09/05/16 1400  vancomycin (VANCOCIN) IVPB 750 mg/150 ml premix  Status:  Discontinued     750 mg 150 mL/hr over 60 Minutes Intravenous Every 12 hours 09/05/16 0833 09/06/16 1550   09/05/16 1115  piperacillin-tazobactam (ZOSYN) IVPB 3.375 g     3.375 g 12.5 mL/hr over 240 Minutes Intravenous Every 8 hours 09/05/16 1105     09/05/16 0145  vancomycin (VANCOCIN) IVPB 750 mg/150 ml premix     750 mg 150 mL/hr over 60 Minutes Intravenous  Once 09/05/16  [id number] 09/05/16 0454   09/05/16 0115  ceFEPIme (MAXIPIME) 1 g in dextrose 5 % 50 mL IVPB  Status:  Discontinued     1 g 100 mL/hr over 30 Minutes Intravenous Every 12 hours 09/05/16 0100 09/05/16 1101   09/04/16 2145  clindamycin (CLEOCIN) IVPB 600 mg     600 mg 100 mL/hr over 30 Minutes Intravenous  Once 09/04/16 2131 09/04/16 2253     Assessment: 82 yo male nursing home resident admitted for respiratory distress after choking.. Pt to be treated empirically for possible aspiration pneumonia.  ABX deescalated to Zosyn as single agent, afebrile.  MRSA PCR (-).  Cultures noncontributory.    Goal of Therapy:  Eradicate infection.  Plan:  Continue Zosyn  3.375gm IV q8h, EID Monitor labs, progress, c/s  Valrie HartScott Desaree Downen, PharmD Clinical Pharmacist Pager:  [phone] 09/08/2016   09/08/2016,10:24 AM

## 2016-09-08 NOTE — Progress Notes (Signed)
MEDICATION RELATED CONSULT NOTE - INITIAL   Pharmacy Consult for Potential Amiodarone Interactions Indication: Pt started on amiodarone  Allergies  Allergen Reactions  . Asa-Apap-Salicyl-Caff     *Takes warfarin*  . Caffeine     Unknown reaction  . Codeine Other (See Comments)    Unknown reaction  . Nsaids Other (See Comments)    *Due to taking Warfarin*  . Sulfonamide Derivatives Nausea And Vomiting   Patient Measurements: Height: 5\' 6"  (167.6 cm) Weight: 136 lb 0.4 oz (61.7 kg) IBW/kg (Calculated) : 63.8  Vital Signs: Temp: 98 F (36.7 C) (10/17 0527) Temp Source: Oral (10/17 0527) BP: 119/58 (10/17 0527) Pulse Rate: 114 (10/17 0527) Intake/Output from previous day: 10/16 0701 - 10/17 0700 In: 680 [P.O.:257; I.V.:223; IV Piggyback:200] Out: 500 [Urine:500] Intake/Output from this shift: No intake/output data recorded.  Labs:  Recent Labs  09/06/16 0623 09/07/16 0635 09/08/16 0454 09/08/16 0606  WBC 6.5  --   --   --   HGB 9.6*  --   --   --   HCT 29.2*  --   --   --   PLT 202  --   --   --   CREATININE 0.43* 0.49*  --  0.54*  MG  --   --  1.2*  --    Estimated Creatinine Clearance: 61.1 mL/min (by C-G formula based on SCr of 0.54 mg/dL (L)).  Microbiology: Recent Results (from the past 720 hour(s))  MRSA PCR Screening     Status: None   Collection Time: 09/05/16  1:27 AM  Result Value Ref Range Status   MRSA by PCR NEGATIVE NEGATIVE Final    Comment:        The GeneXpert MRSA Assay (FDA approved for NASAL specimens only), is one component of a comprehensive MRSA colonization surveillance program. It is not intended to diagnose MRSA infection nor to guide or monitor treatment for MRSA infections.     Medical History: Past Medical History:  Diagnosis Date  . Anxiety   . Atrial fibrillation, persistent (HCC)   . BPH (benign prostatic hyperplasia)   . CAD (coronary artery disease)    Anterior MI and DES stenting 2004, Vfib arrest; left main  25% stenosis, LAD 80% stenosis, 99% stenosis, 25% circumflex stenosis, 25% ramus intermedius stenosis, 50-40% right coronary artery stenosis, 90% RV branch stenosis with right to left collaterals; EF 55% with mild naterior hypokinesis (underwent Taxus stenting at the time) . Stress perfusion study January 2012 EF 48% with old apical infarct  . Cataract   . Diabetes mellitus   . Diverticulosis   . Dyslipidemia   . Essential and other specified forms of tremor 05/05/2013  . GERD (gastroesophageal reflux disease)   . GIB (gastrointestinal bleeding)   . Hearing deficit    Wears hearing aids  . Hemorrhoids   . Hyperlipidemia   . Hyperplastic colon polyp   . Hypertension   . Memory deficits 05/05/2013  . Myocardial infarction   . Obesity   . Stroke (HCC)   . Tremor   . Tremor, essential 12/11/2015  . UC (ulcerative colitis confined to rectum) (HCC)    Left-sided   Medications:  Prescriptions Prior to Admission  Medication Sig Dispense Refill Last Dose  . Amino Acids-Protein Hydrolys (FEEDING SUPPLEMENT, PRO-STAT SUGAR FREE 64,) LIQD Take 30 mLs by mouth 2 (two) times daily.   09/04/2016 at Unknown time  . atorvastatin (LIPITOR) 40 MG tablet TAKE ONE TABLET AT BEDTIME 30 tablet 4 09/03/2016  at Unknown time  . Cholecalciferol (VITAMIN D) 1000 UNITS capsule Take 1 capsule (1,000 Units total) by mouth daily.   09/04/2016 at Unknown time  . digoxin (LANOXIN) 0.125 MG tablet TAKE 1 TABLET IN THE MORNING 30 tablet 4 09/04/2016 at Unknown time  . haloperidol (HALDOL) 0.5 MG tablet Take 0.5 mg by mouth 2 (two) times daily.   09/04/2016 at 800a  . hydrOXYzine (ATARAX/VISTARIL) 10 MG tablet Take one-half tablet daily as needed (Patient taking differently: Take 5 mg by mouth daily as needed for itching. ) 15 tablet 0 unknown  . metoprolol (LOPRESSOR) 100 MG tablet TAKE 1 & 1/2 TABLETS TWICE A DAY (Patient taking differently: TAKE 1 TABLET TWICE A DAY) 135 tablet 1 09/04/2016 at 800a  . mirabegron ER  (MYRBETRIQ) 25 MG TB24 tablet Take 1 tablet (25 mg total) by mouth daily. (Patient taking differently: Take 50 mg by mouth daily. ) 28 tablet 0 09/04/2016 at Unknown time  . Multiple Vitamin (MULTIVITAMIN WITH MINERALS) TABS tablet Take 1 tablet by mouth daily. 30 tablet 11 09/04/2016 at Unknown time  . nitroGLYCERIN (NITROSTAT) 0.4 MG SL tablet Place 1 tablet (0.4 mg total) under the tongue every 5 (five) minutes as needed for chest pain. 25 tablet 1 unknown  . sulfaSALAzine (AZULFIDINE) 500 MG EC tablet Take 1 tablet (500 mg total) by mouth 2 (two) times daily. Take 500 mg twice a day by mouth for 1 week, then 1000 mg BID (Patient taking differently: Take 500 mg by mouth 2 (two) times daily. ) 60 tablet 0 09/04/2016 at 800a  . vitamin B-12 (CYANOCOBALAMIN) 1000 MCG tablet TAKE 1 TABLET IN THE MORNING 30 tablet 4 09/04/2016 at Unknown time  . warfarin (COUMADIN) 2 MG tablet Take 4.5 mg by mouth every evening.   09/04/2016 at 1700   Assessment:  Amiodarone + atorvastatin:  May increase statin levels, increase risk of myopathy, rhabdomyolysis (hepatic metabolism inhibited) (consider reduce dose of atorvastatin)  + haloperidol:  May increase Haldol levels, increase risk of toxicity and QT prolongation; cardiac arrhythmias. (hepatic metabolism inhibited)  + Digoxin:  Combo may increase digoxin levels.  Consider Digoxin dose reduction.  May increase risk of bradycardia, AV block.  + metoprolol tartrate:  May increase risk of metoprolol levels, risk of hypotension, bradycardia, sinus arrest, AV block (hepatic metabolism inhibited)  + warfarin:  May increase risk bleeding, increased INR (dose currently reduced with therapeutic INR)  Goal of Therapy:  Avoid adverse effects  Plan:   Continue to check INR daily, adjust Warfarin dose per pharmacy protocol  Monitor HR, BP, QT interval, adjust meds as clinically appropriate (defer to MD)  Consider reduce dose of atorvastatin (defer to MD)  [name],  Jennavieve Arrick A 09/08/2016,10:34 AM

## 2016-09-08 NOTE — Consult Note (Signed)
   [facility]   09/08/2016  [name] Dec 19, 1929 [id number]  Patient screened for potential [facility] Care Management services. Patient is eligible for [facility] Services. Electronic medical record reveals patient's discharge plan is to return to long term care facility as resident, there were no identifiable [facility] needs at [facility]. [facility] not appropriate at this time. If patient's post hospital needs change please place a [facility] consult. For questions please contact:    Alben SpittleMary E. Albertha GheeNiemczura, RN, BSN, [facility]   Hospital Liaison [facility] [phone] Business Cell  [phone]) Toll Free Office

## 2016-09-08 NOTE — Progress Notes (Signed)
PROGRESS NOTE    [name]  ZYS:[id number] DOB: 1930-06-17 DOA: 09/04/2016 PCP: Rudi Heap, MD    Brief Narrative:  82 y.o. male with medical history significant of DM type 2, anxiety, GERD, and HTN presents to the ED with complaints of choking while eating   Assessment & Plan:   Principal Problem:   HCAP (healthcare-associated pneumonia) Active Problems:   Atrial fibrillation, persistent (HCC)   Memory deficits   Hospital acquired PNA   Pressure injury of skin   Aspiration pneumonia of right lower lobe (HCC)   Protein-calorie malnutrition, severe  1. HCAP versus aspiration pneumonia 1. Patient is continued on Zosyn 2. MRSA swab negative 3. Remains stable at present from infection standpoint 2. Persistent atrial fibrillation 1. Overnight, patient noted to be more tachycardic. By the morning, patient noted to have sustained heart rates into the 140 to 160s 2. Cardiology consulted. appreciate input 3. Patient has been started on amiodarone infusion and transferred to stepdown unit 4. At the time of this dictation, patient's heart rate noted to be as low as the mid 30s while on amiodarone. I have held further amiodarone at this time. Patient is continued on metoprolol, digoxin. Cardizem currently on hold secondary to reduced LVEF. 5. I have ordered when necessary labetalol for sustained heart rate greater than 120, hold parameter for systolic blood pressure less than 100 6. Continue Coumadin per pharmacy dosing. 7. CHADS-VASc of 6 3. Parkinson's disease 1. We'll continue Parkinson's medications for the time being 2. Stable thus far 4. Normocytic anemia 1. Hemoglobin thus far stable  2. Continue to monitor. No evidence of active bleeding at this time  5. Hypertension 1. Blood pressure currently stable. 2. Continue to monitor  DVT prophylaxis: Coumadin Code Status: Full Code Family Communication: Pt in room, family  Disposition Plan: Uncertain at this  time  Consultants:   Procedures:   Antimicrobials: Anti-infectives    Start     Dose/Rate Route Frequency Ordered Stop   09/05/16 1400  vancomycin (VANCOCIN) IVPB 750 mg/150 ml premix  Status:  Discontinued     750 mg 150 mL/hr over 60 Minutes Intravenous Every 12 hours 09/05/16 0833 09/06/16 1550   09/05/16 1115  piperacillin-tazobactam (ZOSYN) IVPB 3.375 g     3.375 g 12.5 mL/hr over 240 Minutes Intravenous Every 8 hours 09/05/16 1105     09/05/16 0145  vancomycin (VANCOCIN) IVPB 750 mg/150 ml premix     750 mg 150 mL/hr over 60 Minutes Intravenous  Once 09/05/16 0135 09/05/16 0454   09/05/16 0115  ceFEPIme (MAXIPIME) 1 g in dextrose 5 % 50 mL IVPB  Status:  Discontinued     1 g 100 mL/hr over 30 Minutes Intravenous Every 12 hours 09/05/16 0100 09/05/16 1101   09/04/16 2145  clindamycin (CLEOCIN) IVPB 600 mg     600 mg 100 mL/hr over 30 Minutes Intravenous  Once 09/04/16 2131 09/04/16 2253      Subjective: Unable to fully assess given difficulty verbalizing  Objective: Vitals:   09/08/16 0905 09/08/16 1139 09/08/16 1200 09/08/16 1631  BP: (!) 105/57     Pulse: (!) 165 73 71 63  Resp:  (!) 33 (!) 27 20  Temp:  97.7 F (36.5 C)  97.5 F (36.4 C)  TempSrc:  Axillary  Axillary  SpO2:  97% 100% 100%  Weight:      Height:        Intake/Output Summary (Last 24 hours) at 09/08/16 1734 Last data filed at 09/08/16  1733  Gross per 24 hour  Intake           211.85 ml  Output             1350 ml  Net         -1138.15 ml   Filed Weights   09/05/16 0100  Weight: 61.7 kg (136 lb 0.4 oz)    Examination:  General exam: Awake, appears somewhat uncomfortable Respiratory system: Normal chest rise, no audible wheezing auscultated Cardiovascular system: Tachycardia, irregular, S1-S2 Gastrointestinal system: Nondistended, positive bowel sounds Central nervous system: No seizures, mild resting tremor noted Extremities: No clubbing, perfused Skin: Normal skin turgor, no  notable skin lesions seen Psychiatry: Difficult to fully assess today secondary to being poorly verbal this morning, does appear somewhat anxious  Data Reviewed: I have personally reviewed following labs and imaging studies  CBC:  Recent Labs Lab 09/04/16 2000 09/05/16 0605 09/06/16 0623  WBC 7.0 6.2 6.5  NEUTROABS 5.3  --   --   HGB 10.3* 9.0* 9.6*  HCT 31.1* 27.3* 29.2*  MCV 96.0 94.8 96.1  PLT 241 187 202   Basic Metabolic Panel:  Recent Labs Lab 09/04/16 2000 09/05/16 0605 09/06/16 0623 09/07/16 0635 09/08/16 0454 09/08/16 0606  NA 129* 128* 129* 128*  --  129*  K 4.4 3.8 3.8 3.8  --  4.0  CL 92* 96* 96* 95*  --  95*  CO2 29 29 30 30   --  28  GLUCOSE 154* 112* 106* 97  --  108*  BUN 18 14 12 10   --  14  CREATININE 0.45* 0.39* 0.43* 0.49*  --  0.54*  CALCIUM 7.9* 7.4* 7.7* 7.5*  --  7.6*  MG  --   --   --   --  1.2*  --    GFR: Estimated Creatinine Clearance: 61.1 mL/min (by C-G formula based on SCr of 0.54 mg/dL (L)). Liver Function Tests: No results for input(s): AST, ALT, ALKPHOS, BILITOT, PROT, ALBUMIN in the last 168 hours. No results for input(s): LIPASE, AMYLASE in the last 168 hours. No results for input(s): AMMONIA in the last 168 hours. Coagulation Profile:  Recent Labs Lab 09/05/16 0808 09/06/16 0815 09/07/16 0635 09/08/16 0606  INR 2.91 2.93 2.84 2.62   Cardiac Enzymes: No results for input(s): CKTOTAL, CKMB, CKMBINDEX, TROPONINI in the last 168 hours. BNP (last 3 results) No results for input(s): PROBNP in the last 8760 hours. HbA1C: No results for input(s): HGBA1C in the last 72 hours. CBG:  Recent Labs Lab 09/08/16 0002 09/08/16 0454 09/08/16 0733 09/08/16 1359 09/08/16 1630  GLUCAP 123* 115* 103* 126* 114*   Lipid Profile: No results for input(s): CHOL, HDL, LDLCALC, TRIG, CHOLHDL, LDLDIRECT in the last 72 hours. Thyroid Function Tests: No results for input(s): TSH, T4TOTAL, FREET4, T3FREE, THYROIDAB in the last 72  hours. Anemia Panel: No results for input(s): VITAMINB12, FOLATE, FERRITIN, TIBC, IRON, RETICCTPCT in the last 72 hours. Sepsis Labs:  Recent Labs Lab 09/04/16 2006 09/05/16 0808  LATICACIDVEN 2.71* 1.0    Recent Results (from the past 240 hour(s))  MRSA PCR Screening     Status: None   Collection Time: 09/05/16  1:27 AM  Result Value Ref Range Status   MRSA by PCR NEGATIVE NEGATIVE Final    Comment:        The GeneXpert MRSA Assay (FDA approved for NASAL specimens only), is one component of a comprehensive MRSA colonization surveillance program. It is not intended to diagnose  MRSA infection nor to guide or monitor treatment for MRSA infections.      Radiology Studies: Dg Chest Port 1 View  Result Date: 09/07/2016 CLINICAL DATA:  CHF EXAM: PORTABLE CHEST 1 VIEW COMPARISON:  09/04/2016 FINDINGS: Cardiac shadow is again enlarged in size. Increasing right-sided pleural effusion is noted. There is likely underlying basilar atelectasis on the right. Left lung remains clear. The degree of vascular congestion has improved somewhat in the interval from the prior exam. No acute bony abnormality is seen. IMPRESSION: Increasing right-sided pleural effusion. Electronically Signed   By: Alcide Clever M.D.   On: 09/07/2016 14:22    Scheduled Meds: . atorvastatin  40 mg Oral QHS  . chlorhexidine  15 mL Mouth Rinse BID  . cholecalciferol  1,000 Units Oral Daily  . digoxin  125 mcg Oral q morning - 10a  . feeding supplement (ENSURE ENLIVE)  237 mL Oral BID BM  . feeding supplement (PRO-STAT SUGAR FREE 64)  30 mL Oral BID  . furosemide  40 mg Intravenous Once  . haloperidol  0.5 mg Oral BID  . insulin aspart  0-9 Units Subcutaneous Q4H  . mouth rinse  15 mL Mouth Rinse q12n4p  . metoprolol  100 mg Oral BID  . mirabegron ER  50 mg Oral Daily  . multivitamin with minerals  1 tablet Oral Daily  . piperacillin-tazobactam (ZOSYN)  IV  3.375 g Intravenous Q8H  . sodium chloride flush  3 mL  Intravenous Q12H  . sulfaSALAzine  500 mg Oral BID  . vitamin B-12  1,000 mcg Oral q morning - 10a  . warfarin  2 mg Oral Once  . Warfarin - Pharmacist Dosing Inpatient   Does not apply Q24H   Continuous Infusions: . amiodarone 30 mg/hr (09/08/16 1731)     LOS: 4 days   Ayan Yankey, Scheryl Marten, MD [facility] Pager [phone]  If 7PM-7AM, please contact night-coverage [email or web address] Password [facility] 09/08/2016, 5:34 PM

## 2016-09-08 NOTE — Clinical Social Work Note (Signed)
Clinical Social Work Assessment  Patient Details  Name: [name] MRN: [id number] Date of Birth: October 09, 1930  Date of referral:  09/08/16               Reason for consult:  Facility Placement (From [facility])                Permission sought to share information with:    Permission granted to share information::     Name::        Agency::     Relationship::     Contact Information:  [name], Aurther LoftNephew, listed on chart; [name]  Housing/Transportation Living arrangements for the past 2 months:  Skilled Building surveyorursing Facility Source of Information:  Facility, Other (Comment Required) (Nephew, [name]. ) Patient Interpreter Needed:  None Criminal Activity/Legal Involvement Pertinent to Current Situation/Hospitalization:  No - Comment as needed Significant Relationships:  Community Support, Spouse, Other Family Members Lives with:  Facility Resident Do you feel safe going back to the place where you live?  Yes Need for family participation in patient care:  Yes (Comment)  Care giving concerns:  Facility resident, none identified.    Social Worker assessment / plan: [name]. Patient has been in the facility since 2/17. He is bed bound, however facility staff gets him up to his wheelchair daily.  ADL's are completed by staff and patient is mainly total care.  Patient's wife visits him several times per week.  Patient can return to the facility. CSW spoke with patient's nephew, [name]. Mr. Alona BeneJoyce confirmed [name]'s statements. He stated that he desired for patient to return to the facility at discharge.   Employment status:  Retired Health and safety inspectornsurance information:    PT Recommendations:  Not assessed at this time Information / Referral to community resources:     Patient/Family's Response to care:  Family is agreeable for patient to return to facility at discharge.   Patient/Family's Understanding of and Emotional Response to Diagnosis, Current Treatment,  and Prognosis:  Patient's family understands patient's diagnosis, treatment and prognosis.   Emotional Assessment Appearance:  Appears stated age Attitude/Demeanor/Rapport:  Unable to Assess Affect (typically observed):  Unable to Assess Orientation:  Oriented to Self Alcohol / Substance use:  Not Applicable Psych involvement (Current and /or in the community):  No (Comment)  Discharge Needs  Concerns to be addressed:  Other (Comment Required (Return to [facility]) Readmission within the last 30 days:  No Current discharge risk:  Chronically ill Barriers to Discharge:  No Barriers Identified   Annice NeedySettle, Danessa Mensch D, LCSW 09/08/2016, 10:10 AM

## 2016-09-08 NOTE — NC FL2 (Signed)
[place] MEDICAID FL2 LEVEL OF CARE SCREENING TOOL     IDENTIFICATION  Patient Name: [name] Birthdate: [birth year] Sex: male Admission Date (Current Location): 09/04/2016  [facility] [place]   Facility and Address:  [facility],  [address], [place] [id number]      Provider Number: [id number]  Attending Physician Name and Address:  Jerald KiefStephen K Chiu, MD  Relative Name and Phone Number:       Current Level of Care: Hospital Recommended Level of Care: Skilled Nursing Facility Prior Approval Number:    Date Approved/Denied:   PASRR Number:    Discharge Plan: Home    Current Diagnoses: Patient Active Problem List   Diagnosis Date Noted  . Protein-calorie malnutrition, severe 09/07/2016  . Aspiration pneumonia of right lower lobe (HCC)   . Pressure injury of skin 09/05/2016  . Hospital acquired PNA 09/04/2016  . HCAP (healthcare-associated pneumonia) 09/04/2016  . Fall 01/10/2016  . Hyponatremia 01/10/2016  . Tremor, essential 12/11/2015  . Altered mental status 11/30/2015  . Type 2 diabetes, diet controlled (HCC) 06/28/2015  . Cerebrovascular disease 12/05/2014  . ASCVD (arteriosclerotic cardiovascular disease) 12/05/2014  . Persistent atrial fibrillation (HCC) 12/05/2014  . High risk medication use 06/22/2013  . Hypokalemia 06/22/2013  . Memory deficits 05/05/2013  . Essential tremor 05/05/2013  . Cerebrovascular disease, unspecified 01/16/2013  . CAD (coronary artery disease)   . BPH (benign prostatic hypertrophy) 02/11/2011  . Premature atrial complexes 02/11/2011  . Panic attack 02/11/2011  . Hyperlipidemia 02/12/2010  . Atrial fibrillation, persistent (HCC) 10/30/2009  . GERD 03/12/2009  . ULCERATIVE COLITIS-LEFT SIDE 03/12/2009    Orientation RESPIRATION BLADDER Height & Weight     Self  Normal Incontinent Weight: 136 lb 0.4 oz (61.7 kg) Height:  5\' 6"  (167.6 cm)  BEHAVIORAL SYMPTOMS/MOOD NEUROLOGICAL  BOWEL NUTRITION STATUS      Incontinent Diet (DIET DYS 2. Meds t obe brushed in puree; pt should be seated as closed to a 90 degrees as possible for all intake. Slow rate. )  AMBULATORY STATUS COMMUNICATION OF NEEDS Skin   Extensive Assist Verbally Other (Comment) (Left Heel, unstageable )                       Personal Care Assistance Level of Assistance  Bathing, Feeding, Dressing Bathing Assistance: Maximum assistance Feeding assistance: Maximum assistance Dressing Assistance: Maximum assistance     Functional Limitations Info  Sight, Hearing, Speech Sight Info: Adequate Hearing Info: Adequate Speech Info: Adequate    SPECIAL CARE FACTORS FREQUENCY                       Contractures Contractures Info: Not present    Additional Factors Info  Code Status, Allergies, Psychotropic Code Status Info: DNR Allergies Info: Asa-apap-salicyl-caff, Caffine, Codeine, Nsaids, Sulfonamide Derivatives Psychotropic Info: Paxil         Current Medications (09/08/2016):  This is the current hospital active medication list Current Facility-Administered Medications  Medication Dose Route Frequency Provider Last Rate Last Dose  . amiodarone (NEXTERONE PREMIX) 360-4.14 MG/200ML-% (1.8 mg/mL) IV infusion  60 mg/hr Intravenous Continuous Jerald KiefStephen K Chiu, MD       Followed by  . amiodarone (NEXTERONE PREMIX) 360-4.14 MG/200ML-% (1.8 mg/mL) IV infusion  30 mg/hr Intravenous Continuous Jerald KiefStephen K Chiu, MD      . amiodarone (NEXTERONE) 1.8 mg/mL load via infusion 150 mg  150 mg Intravenous Once Jerald KiefStephen K Chiu,  MD      . atorvastatin (LIPITOR) tablet 40 mg  40 mg Oral QHS Houston Siren, MD   40 mg at 09/07/16 2255  . chlorhexidine (PERIDEX) 0.12 % solution 15 mL  15 mL Mouth Rinse BID Houston Siren, MD   15 mL at 09/08/16 0906  . cholecalciferol (VITAMIN D) tablet 1,000 Units  1,000 Units Oral Daily Houston Siren, MD   1,000 Units at 09/08/16 0905  . digoxin (LANOXIN) tablet 125 mcg  125 mcg Oral q  morning - 10a Houston Siren, MD   125 mcg at 09/08/16 0906  . diltiazem (CARDIZEM) injection 10 mg  10 mg Intravenous QID PRN Houston Siren, MD      . feeding supplement (ENSURE ENLIVE) (ENSURE ENLIVE) liquid 237 mL  237 mL Oral BID BM Jerald Kief, MD   237 mL at 09/08/16 0909  . feeding supplement (PRO-STAT SUGAR FREE 64) liquid 30 mL  30 mL Oral BID Jerald Kief, MD   30 mL at 09/08/16 0907  . furosemide (LASIX) injection 40 mg  40 mg Intravenous Once Jerald Kief, MD      . haloperidol (HALDOL) tablet 0.5 mg  0.5 mg Oral BID Houston Siren, MD   0.5 mg at 09/08/16 0904  . insulin aspart (novoLOG) injection 0-9 Units  0-9 Units Subcutaneous Q4H Houston Siren, MD   1 Units at 09/07/16 1800  . magnesium sulfate IVPB 4 g 100 mL  4 g Intravenous Once Jerald Kief, MD      . MEDLINE mouth rinse  15 mL Mouth Rinse q12n4p Houston Siren, MD   15 mL at 09/07/16 1600  . metoprolol (LOPRESSOR) tablet 100 mg  100 mg Oral BID Houston Siren, MD   100 mg at 09/08/16 0905  . mirabegron ER (MYRBETRIQ) tablet 50 mg  50 mg Oral Daily Houston Siren, MD   50 mg at 09/08/16 0904  . morphine 2 MG/ML injection 1 mg  1 mg Intravenous Q4H PRN Jerald Kief, MD      . multivitamin with minerals tablet 1 tablet  1 tablet Oral Daily Jerald Kief, MD   1 tablet at 09/08/16 0905  . piperacillin-tazobactam (ZOSYN) IVPB 3.375 g  3.375 g Intravenous Q8H Jerald Kief, MD   3.375 g at 09/08/16 0907  . sodium chloride flush (NS) 0.9 % injection 3 mL  3 mL Intravenous Q12H Houston Siren, MD   3 mL at 09/08/16 0908  . sulfaSALAzine (AZULFIDINE) EC tablet 500 mg  500 mg Oral BID Houston Siren, MD   500 mg at 09/08/16 0906  . vitamin B-12 (CYANOCOBALAMIN) tablet 1,000 mcg  1,000 mcg Oral q morning - 10a Houston Siren, MD   1,000 mcg at 09/08/16 0906  . warfarin (COUMADIN) tablet 2 mg  2 mg Oral Once Jerald Kief, MD      . Warfarin - Pharmacist Dosing Inpatient   Does not apply Q24H Jerald Kief, MD         Discharge Medications: Please see discharge summary for a  list of discharge medications.  Relevant Imaging Results:  Relevant Lab Results:   Additional Information    Myrick Mcnairy, Juleen China, LCSW

## 2016-09-08 NOTE — Consult Note (Signed)
CARDIOLOGY CONSULT NOTE   Patient ID: [name] MRN: [id number] DOB/AGE: [birth year] 80 y.o.  Admit Date: 09/04/2016 Referring Physician: TRH-Chui Primary Physician: Rudi HeapMOORE, DONALD, MD Consulting Cardiologist: Prentice DockerKoneswaran, Davaun Quintela  Primary Cardiologist: Rollene RotundaHochrein, James MD Reason for Consultation: Atrial fib with RVR and hypotension  Clinical Summary [name] is a 82 y.o.male with known history of CAD, persistent atrial fib CHADS VASC Score of 7 on coumadin, hypertension, with other history to include GI bleed,  GERD, CVA, diabetes, hyperlipidemia, who presented to ER after choking on food earlier in the day and later becoming unresponsive.  He was diagnosed with HCAP, placed on vancomycin and now is on Zosyn. Heart rate was controlled on diltiazem and metoprolol. However, due to hypotensive state his metoprolol was held.   This am patient began to have rapid atrial fib with rates up to 150bpm. He was started on amiodarone gtt after bolus and is moving to the step down unit. We are asked for assistance in management.   On arrival to ER, BP was 130/100, HR 76 O2 Sat 92%, febrile at 99.3. Na; 129, Creatinine 0.45, Hgb 10.3, Hct 311. CXR was noted right lower lobe density, CHF with mild edema, and possible right pleural effusion. He received lasix 40 mg IV x1, with output of 1.752 since admission. Digoxin level of 0.4. This am labs demonstrate mild anemia, with Hgb 9.6 , and Hct of 29.2.  Magnesium 1.2 this am. This is being repleated.   Unable to get history from patient as he is nonverbal with the exception of some nondescript noises when asking questions. History is obtained from current and past medical records.  Allergies  Allergen Reactions  . Asa-Apap-Salicyl-Caff     *Takes warfarin*  . Caffeine     Unknown reaction  . Codeine Other (See Comments)    Unknown reaction  . Nsaids Other (See Comments)    *Due to taking Warfarin*  . Sulfonamide Derivatives Nausea And Vomiting      Medications Scheduled Medications: . amiodarone  150 mg Intravenous Once  . atorvastatin  40 mg Oral QHS  . chlorhexidine  15 mL Mouth Rinse BID  . cholecalciferol  1,000 Units Oral Daily  . digoxin  125 mcg Oral q morning - 10a  . feeding supplement (ENSURE ENLIVE)  237 mL Oral BID BM  . feeding supplement (PRO-STAT SUGAR FREE 64)  30 mL Oral BID  . furosemide  40 mg Intravenous Once  . haloperidol  0.5 mg Oral BID  . insulin aspart  0-9 Units Subcutaneous Q4H  . mouth rinse  15 mL Mouth Rinse q12n4p  . metoprolol  100 mg Oral BID  . mirabegron ER  50 mg Oral Daily  . multivitamin with minerals  1 tablet Oral Daily  . piperacillin-tazobactam (ZOSYN)  IV  3.375 g Intravenous Q8H  . sodium chloride flush  3 mL Intravenous Q12H  . sulfaSALAzine  500 mg Oral BID  . vitamin B-12  1,000 mcg Oral q morning - 10a  . Warfarin - Pharmacist Dosing Inpatient   Does not apply Q24H     Infusions: . amiodarone     Followed by  . amiodarone       PRN Medications:  diltiazem, morphine injection   Past Medical History:  Diagnosis Date  . Anxiety   . Atrial fibrillation, persistent (HCC)   . BPH (benign prostatic hyperplasia)   . CAD (coronary artery disease)    Anterior MI and DES stenting 2004, Vfib  arrest; left main 25% stenosis, LAD 80% stenosis, 99% stenosis, 25% circumflex stenosis, 25% ramus intermedius stenosis, 50-40% right coronary artery stenosis, 90% RV branch stenosis with right to left collaterals; EF 55% with mild naterior hypokinesis (underwent Taxus stenting at the time) . Stress perfusion study January 2012 EF 48% with old apical infarct  . Cataract   . Diabetes mellitus   . Diverticulosis   . Dyslipidemia   . Essential and other specified forms of tremor 05/05/2013  . GERD (gastroesophageal reflux disease)   . GIB (gastrointestinal bleeding)   . Hearing deficit    Wears hearing aids  . Hemorrhoids   . Hyperlipidemia   . Hyperplastic colon polyp   .  Hypertension   . Memory deficits 05/05/2013  . Myocardial infarction   . Obesity   . Stroke (HCC)   . Tremor   . Tremor, essential 12/11/2015  . UC (ulcerative colitis confined to rectum) (HCC)    Left-sided    Past Surgical History:  Procedure Laterality Date  . CATARACT EXTRACTION W/PHACO  10/27/2012   Procedure: CATARACT EXTRACTION PHACO AND INTRAOCULAR LENS PLACEMENT (IOC);  Surgeon: Gemma Payor, MD;  Location: [facility];  Service: Ophthalmology;  Laterality: Left;  CDE 22.67  . CORONARY ANGIOPLASTY WITH STENT PLACEMENT     Taxus stenting  . HAMMER TOE SURGERY     right  . INGUINAL HERNIA REPAIR     right, bilateral    Family History  Problem Relation Age of Onset  . Alzheimer's disease Brother   . Diabetes Brother     x 4  . Diabetes Mother     ?  . Diabetes Father   . Heart disease Father   . Heart disease Brother     x 3  . COPD Sister   . Diabetes Sister   . Heart disease Paternal Grandfather   . Colon cancer Neg Hx      Social History Mr. Scroggins reports that he quit smoking about 57 years ago. He has quit using smokeless tobacco. His smokeless tobacco use included Chew. Mr. Curran reports that he does not drink alcohol.  Review of Systems Complete review of systems are found to be negative unless outlined in H&P above.  Physical Examination Blood pressure (!) 119/58, pulse (!) 114, temperature 98 F (36.7 C), temperature source Oral, resp. rate 20, height 5\' 6"  (1.676 m), weight 136 lb 0.4 oz (61.7 kg), SpO2 96 %.  Intake/Output Summary (Last 24 hours) at 09/08/16 0954 Last data filed at 09/08/16 0527  Gross per 24 hour  Intake              460 ml  Output              500 ml  Net              -40 ml    Telemetry: Atrial fibrillation heart rate 90 bpm, review of telemetry earlier reveals atrial fibrillation heart rate 120-150 bpm.  GEN: Frail, contractured HEENT: Conjunctiva and lids normal, oropharynx clear with moist mucosa. Neck: Supple, no elevated  JVP or carotid bruits, no thyromegaly. Lungs: Upper airways clear, diminished in the bases, does not respond to verbal command to take deep breaths. Cardiac: Irregular rate and rhythm, no S3 or significant systolic murmur, no pericardial rub. Abdomen: Soft, nontender, no hepatomegaly, bowel sounds present, no guarding or rebound. Extremities: No pitting edema, distal pulses 2+. Skin: Warm and dry. Pale Musculoskeletal: No kyphosis. Contractures of the  upper  right extremities, and left leg. Neuropsychiatric: Alert and oriented x3, affect grossly appropriate. Generalized tremors.  Prior Cardiac Testing/Procedures 1 Echocardiogram 12/01/2015   Left ventricle: The cavity size was normal. Systolic function was   mildly to moderately reduced. The estimated ejection fraction was   in the range of 40% to 45%. Akinesis of the   mid-apicalanteroseptal and anterior myocardium. - Aortic valve: Valve area (Vmax): 1.64 cm^2. - Mitral valve: There was mild to moderate regurgitation. - Left atrium: The atrium was mildly dilated. - Right ventricle: The cavity size was mildly dilated. Wall   thickness was normal. Systolic function was mildly reduced. - Right atrium: The atrium was mildly to moderately dilated. - Pulmonary arteries: Systolic pressure was mildly increased. - Pericardium, extracardiac: A trivial pericardial effusion was   identified.  2. Carotid Ultrasound IMPRESSION: 1. Moderate (50- 69%) stenosis proximal right internal carotid artery secondary to heterogeneous and mildly irregular atherosclerotic plaque. 2. Mild (1-49%) stenosis proximal left internal carotid artery secondary to heterogeneous atherosclerotic plaque. 3. Vertebral arteries are patent with normal antegrade flow. Signed,   Lab Results  Basic Metabolic Panel:  Recent Labs Lab 09/04/16 2000 09/05/16 0605 09/06/16 0623 09/07/16 0635 09/08/16 0606  NA 129* 128* 129* 128* 129*  K 4.4 3.8 3.8 3.8 4.0  CL 92* 96*  96* 95* 95*  CO2 29 29 30 30 28   GLUCOSE 154* 112* 106* 97 108*  BUN 18 14 12 10 14   CREATININE 0.45* 0.39* 0.43* 0.49* 0.54*  CALCIUM 7.9* 7.4* 7.7* 7.5* 7.6*    Liver Function Tests: No results for input(s): AST, ALT, ALKPHOS, BILITOT, PROT, ALBUMIN in the last 168 hours.  CBC:  Recent Labs Lab 09/04/16 2000 09/05/16 0605 09/06/16 0623  WBC 7.0 6.2 6.5  NEUTROABS 5.3  --   --   HGB 10.3* 9.0* 9.6*  HCT 31.1* 27.3* 29.2*  MCV 96.0 94.8 96.1  PLT 241 187 202     Radiology: Dg Chest Port 1 View  Result Date: 09/07/2016 CLINICAL DATA:  CHF EXAM: PORTABLE CHEST 1 VIEW COMPARISON:  09/04/2016 FINDINGS: Cardiac shadow is again enlarged in size. Increasing right-sided pleural effusion is noted. There is likely underlying basilar atelectasis on the right. Left lung remains clear. The degree of vascular congestion has improved somewhat in the interval from the prior exam. No acute bony abnormality is seen. IMPRESSION: Increasing right-sided pleural effusion. Electronically Signed   By: Alcide Clever M.D.   On: 09/07/2016 14:22    ECG: Repeat this. EKG dated 09/07/2016 revealed atrial fibrillation with rate 146 bpm with significant artifact   Impression and Recommendations  1. Persistent atrial fibrillation: Heart rate is not controlled currently, with rates of 150 bpm early morning hours. Metoprolol was held due to hypotension he remained on diltiazem. Due to rapid heart rhythm amiodarone bolus will be provided with drip after arriving to ICU.  Magnesium is being repleted. BP is soft but stable. He has tenuous by mouth in take due to swallowing issues. CHADS VASC Score of 7  Agree with institution of amiodarone drip. This may be temporary for now.  Continue Coumadin therapy INR this a.m. 2.62.  Hemoglobin has decreased some since admission. Consider Hemoccult stools.  2. Acute on chronic systolic heart failure: Most recent echocardiogram January 2017 demonstrated an EF of 40-45%.  He was given one dose of IV Lasix with good urinary output. Creatinine 0.54 but sodium remains low at 129. He may need additional diuresis but will need to adjust diltiazem to  prevent hypotension. May be be able to do so with addition of amiodarone.  Review of home medications has him on metoprolol 150 mg twice a day, digoxin 0.125 mg daily, and not on diltiazem. It may be best to change diltiazem to metoprolol as he is taking it at home, in the setting of reduced ejection fraction to assist with prevention of recurrent CHF.  He will need continued diuresis. Once heart rate is better controlled this will help with cardiac output. He is currently not on Lasix prior to admission. And only received one dose while hospitalized. Consider adding back IV Lasix and reduce dose daily. We'll repeat chest x-ray as most recent revealed increased right pleural effusion.  3. Healthcare acquired pneumonia: Being treated with Zosyn with improvement in breathing status and symptoms.  4. Anemia: Watch closely with use of Coumadin and amiodarone. He has a history of GI bleeding. Hemoglobin down almost 1 g since admission. This may be dilutional from IV fluids and CHF. Will follow closely  5. History of CVA: Contributing to dysphagia. May be difficult concerning by mouth medication intake. CNA who is caring for him states that he will take down foods and by mouth liquids but does so slowly.  6. DNR  Signed: Bettey Mare. Lawrence NP [facility]  09/08/2016, 9:54 AM Co-Sign MD  The patient was seen and examined, and I agree with the history, physical exam, assessment and plan as documented above, with modifications as noted below. 82 yr old male admitted with pneumonia and has permanent atrial fibrillation. HR became elevated and thus we were consulted. At time of my exam, HR 80-90 bpm range. Has not yet transferred to ICU. Increasing right pleural effusion (probably parapneumonic). I will give an additional dose of 40 mg  IV Lasix. Uncertain about I/O accuracy. Agree with IV amiodarone bolus+infusion, metoprolol, and digoxin. Avoid diltiazem for now given reduced LVEF. Continue warfarin for anticoagulation.  Prentice Docker, MD, [facility]  09/08/2016 11:02 AM

## 2016-09-09 ENCOUNTER — Inpatient Hospital Stay (HOSPITAL_COMMUNITY): Payer: Medicare Other

## 2016-09-09 DIAGNOSIS — I5023 Acute on chronic systolic (congestive) heart failure: Secondary | ICD-10-CM

## 2016-09-09 DIAGNOSIS — I4891 Unspecified atrial fibrillation: Secondary | ICD-10-CM

## 2016-09-09 DIAGNOSIS — R4182 Altered mental status, unspecified: Secondary | ICD-10-CM

## 2016-09-09 LAB — CBC WITH DIFFERENTIAL/PLATELET
BASOS PCT: 1 %
Basophils Absolute: 0.1 10*3/uL (ref 0.0–0.1)
EOS ABS: 0.3 10*3/uL (ref 0.0–0.7)
EOS PCT: 7 %
HEMATOCRIT: 30.1 % — AB (ref 39.0–52.0)
Hemoglobin: 9.9 g/dL — ABNORMAL LOW (ref 13.0–17.0)
Lymphocytes Relative: 23 %
Lymphs Abs: 1.2 10*3/uL (ref 0.7–4.0)
MCH: 31.6 pg (ref 26.0–34.0)
MCHC: 32.9 g/dL (ref 30.0–36.0)
MCV: 96.2 fL (ref 78.0–100.0)
MONO ABS: 0.5 10*3/uL (ref 0.1–1.0)
MONOS PCT: 10 %
Neutro Abs: 3.1 10*3/uL (ref 1.7–7.7)
Neutrophils Relative %: 59 %
Platelets: 238 10*3/uL (ref 150–400)
RBC: 3.13 MIL/uL — ABNORMAL LOW (ref 4.22–5.81)
RDW: 13.9 % (ref 11.5–15.5)
WBC: 5.2 10*3/uL (ref 4.0–10.5)

## 2016-09-09 LAB — BASIC METABOLIC PANEL
Anion gap: 7 (ref 5–15)
BUN: 16 mg/dL (ref 6–20)
CALCIUM: 7.5 mg/dL — AB (ref 8.9–10.3)
CO2: 30 mmol/L (ref 22–32)
CREATININE: 0.51 mg/dL — AB (ref 0.61–1.24)
Chloride: 92 mmol/L — ABNORMAL LOW (ref 101–111)
GFR calc non Af Amer: >60 mL/min (ref >60)
Glucose, Bld: 100 mg/dL — ABNORMAL HIGH (ref 65–99)
Potassium: 3.8 mmol/L (ref 3.5–5.1)
SODIUM: 129 mmol/L — AB (ref 135–145)

## 2016-09-09 LAB — PROTIME-INR
INR: 2.41
PROTHROMBIN TIME: 26.7 s — AB (ref 11.4–15.2)

## 2016-09-09 LAB — GLUCOSE, CAPILLARY
GLUCOSE-CAPILLARY: 112 mg/dL — AB (ref 65–99)
GLUCOSE-CAPILLARY: 101 mg/dL — AB (ref 65–99)
GLUCOSE-CAPILLARY: 132 mg/dL — AB (ref 65–99)
Glucose-Capillary: 109 mg/dL — ABNORMAL HIGH (ref 65–99)
Glucose-Capillary: 111 mg/dL — ABNORMAL HIGH (ref 65–99)
Glucose-Capillary: 166 mg/dL — ABNORMAL HIGH (ref 65–99)

## 2016-09-09 MED ORDER — FUROSEMIDE 10 MG/ML IJ SOLN
40.0000 mg | Freq: Every day | INTRAMUSCULAR | Status: DC
  Administered 2016-09-09 – 2016-09-10 (×2): 40 mg via INTRAVENOUS
  Filled 2016-09-09 (×2): qty 4

## 2016-09-09 MED ORDER — WARFARIN SODIUM 2 MG PO TABS
2.0000 mg | ORAL_TABLET | Freq: Once | ORAL | Status: AC
  Administered 2016-09-09: 2 mg via ORAL
  Filled 2016-09-09: qty 1

## 2016-09-09 NOTE — Progress Notes (Addendum)
PROGRESS NOTE    [name]  QIO:[id number] DOB: 21-Jul-1930 DOA: 09/04/2016 PCP: Rudi Heap, MD     Brief Narrative:  82 y/o man admitted on 10/13 for unresponsiveness following a choking episode.   Assessment & Plan:   Principal Problem:   HCAP (healthcare-associated pneumonia) Active Problems:   Atrial fibrillation, persistent (HCC)   Memory deficits   Hospital acquired PNA   Pressure injury of skin   Aspiration pneumonia of right lower lobe (HCC)   Protein-calorie malnutrition, severe   Dysphagia, oropharyngeal phase   Aspiration PNA -More likely than HCAP given inciting episode. -Continue zosyn. -ST on board. To continue D1 diet with thin liquids. Awaiting MBS study.  Persistent A Fib -Remains in a fib with rate in the 80s this am. -Amiodarone drip was discontinued soon after initiation due to bradycardia. -For now remains on metoprolol 100 mg BID and digoxin. -Cards on board to provide further recommendations. -On warfarin for anticoagulation.  Parkinson's Disease -Stable thus far. -Not on any true Parkinson's meds.  HTN -BP currently stable.   DVT prophylaxis: Coumadin Code Status: DNR Family Communication: no family at bedside this am Disposition Plan: Keep in ICU, may transfer to floor this afternoon if HR remains stable. Will request palliative medicine consult given advanced age, aspiration for further discussion regarding GOC.  Consultants:   Cardiology  Procedures:   None  Antimicrobials:   Zosyn    Subjective: Sleeping, appear lethargic, not able to participate in communication  Objective: Vitals:   09/09/16 0400 09/09/16 0500 09/09/16 0600 09/09/16 0700  BP: 99/63 96/68 121/61 111/80  Pulse: 79 79 83 (!) 104  Resp: 17 15 18 18   Temp: 98.3 F (36.8 C)     TempSrc: Oral     SpO2: 99% 98% 96% 96%  Weight:  63.4 kg (139 lb 12.4 oz)    Height:        Intake/Output Summary (Last 24 hours) at 09/09/16 0804 Last data  filed at 09/09/16 0500  Gross per 24 hour  Intake           208.85 ml  Output             1900 ml  Net         -1691.15 ml   Filed Weights   09/05/16 0100 09/09/16 0500  Weight: 61.7 kg (136 lb 0.4 oz) 63.4 kg (139 lb 12.4 oz)    Examination:  General exam: drowsy Respiratory system: Clear to auscultation. Respiratory effort normal. Cardiovascular system:irregular, no M/R/G Gastrointestinal system: Abdomen is nondistended, soft and nontender. No organomegaly or masses felt. Normal bowel sounds heard. Central nervous system: Unable to fully assess given current mental state. Extremities: No C/C/E, +pedal pulses Skin: No rashes, lesions or ulcers Psychiatry: Unable to fully assess given current mental state.    Data Reviewed: I have personally reviewed following labs and imaging studies  CBC:  Recent Labs Lab 09/04/16 2000 09/05/16 0605 09/06/16 0623 09/09/16 0413  WBC 7.0 6.2 6.5 5.2  NEUTROABS 5.3  --   --  3.1  HGB 10.3* 9.0* 9.6* 9.9*  HCT 31.1* 27.3* 29.2* 30.1*  MCV 96.0 94.8 96.1 96.2  PLT 241 187 202 238   Basic Metabolic Panel:  Recent Labs Lab 09/05/16 0605 09/06/16 0623 09/07/16 0635 09/08/16 0454 09/08/16 0606 09/09/16 0413  NA 128* 129* 128*  --  129* 129*  K 3.8 3.8 3.8  --  4.0 3.8  CL 96* 96* 95*  --  95*  92*  CO2 29 30 30   --  28 30  GLUCOSE 112* 106* 97  --  108* 100*  BUN 14 12 10   --  14 16  CREATININE 0.39* 0.43* 0.49*  --  0.54* 0.51*  CALCIUM 7.4* 7.7* 7.5*  --  7.6* 7.5*  MG  --   --   --  1.2*  --   --    GFR: Estimated Creatinine Clearance: 62.7 mL/min (by C-G formula based on SCr of 0.51 mg/dL (L)). Liver Function Tests: No results for input(s): AST, ALT, ALKPHOS, BILITOT, PROT, ALBUMIN in the last 168 hours. No results for input(s): LIPASE, AMYLASE in the last 168 hours. No results for input(s): AMMONIA in the last 168 hours. Coagulation Profile:  Recent Labs Lab 09/05/16 0808 09/06/16 0815 09/07/16 0635 09/08/16 0606  09/09/16 0413  INR 2.91 2.93 2.84 2.62 2.41   Cardiac Enzymes: No results for input(s): CKTOTAL, CKMB, CKMBINDEX, TROPONINI in the last 168 hours. BNP (last 3 results) No results for input(s): PROBNP in the last 8760 hours. HbA1C: No results for input(s): HGBA1C in the last 72 hours. CBG:  Recent Labs Lab 09/08/16 1359 09/08/16 1630 09/08/16 1938 09/08/16 2359 09/09/16 0451  GLUCAP 126* 114* 115* 166* 112*   Lipid Profile: No results for input(s): CHOL, HDL, LDLCALC, TRIG, CHOLHDL, LDLDIRECT in the last 72 hours. Thyroid Function Tests: No results for input(s): TSH, T4TOTAL, FREET4, T3FREE, THYROIDAB in the last 72 hours. Anemia Panel: No results for input(s): VITAMINB12, FOLATE, FERRITIN, TIBC, IRON, RETICCTPCT in the last 72 hours. Urine analysis:    Component Value Date/Time   COLORURINE AMBER (A) 09/04/2016 2001   APPEARANCEUR CLEAR 09/04/2016 2001   LABSPEC 1.020 09/04/2016 2001   PHURINE 6.0 09/04/2016 2001   GLUCOSEU NEGATIVE 09/04/2016 2001   HGBUR NEGATIVE 09/04/2016 2001   BILIRUBINUR SMALL (A) 09/04/2016 2001   KETONESUR TRACE (A) 09/04/2016 2001   PROTEINUR NEGATIVE 09/04/2016 2001   UROBILINOGEN 2.0 (H) 09/26/2013 1407   NITRITE NEGATIVE 09/04/2016 2001   LEUKOCYTESUR NEGATIVE 09/04/2016 2001   Sepsis Labs: @LABRCNTIP (procalcitonin:4,lacticidven:4)  ) Recent Results (from the past 240 hour(s))  MRSA PCR Screening     Status: None   Collection Time: 09/05/16  1:27 AM  Result Value Ref Range Status   MRSA by PCR NEGATIVE NEGATIVE Final    Comment:        The GeneXpert MRSA Assay (FDA approved for NASAL specimens only), is one component of a comprehensive MRSA colonization surveillance program. It is not intended to diagnose MRSA infection nor to guide or monitor treatment for MRSA infections.          Radiology Studies: Dg Chest Port 1 View  Result Date: 09/09/2016 CLINICAL DATA:  CHF and pneumonia. EXAM: PORTABLE CHEST 1 VIEW  COMPARISON:  09/07/2016. FINDINGS: Cardiomegaly with pulmonary vascular congestion and bilateral pulmonary interstitial prominence consistent interstitial edema again noted. Right lower lobe atelectasis again noted. Bilateral pleural effusions right side greater than left again noted. IMPRESSION: 1. Congestive heart failure with pulmonary interstitial edema and bilateral pleural effusions, right side greater than left again noted. No significant change. 2. Persistent right lower lobe atelectasis. Electronically Signed   By: Maisie Fushomas  Register   On: 09/09/2016 07:51   Dg Chest Port 1 View  Result Date: 09/07/2016 CLINICAL DATA:  CHF EXAM: PORTABLE CHEST 1 VIEW COMPARISON:  09/04/2016 FINDINGS: Cardiac shadow is again enlarged in size. Increasing right-sided pleural effusion is noted. There is likely underlying basilar atelectasis on the right.  Left lung remains clear. The degree of vascular congestion has improved somewhat in the interval from the prior exam. No acute bony abnormality is seen. IMPRESSION: Increasing right-sided pleural effusion. Electronically Signed   By: Alcide Clever M.D.   On: 09/07/2016 14:22        Scheduled Meds: . atorvastatin  40 mg Oral QHS  . chlorhexidine  15 mL Mouth Rinse BID  . cholecalciferol  1,000 Units Oral Daily  . digoxin  125 mcg Oral q morning - 10a  . feeding supplement (ENSURE ENLIVE)  237 mL Oral BID BM  . feeding supplement (PRO-STAT SUGAR FREE 64)  30 mL Oral BID  . furosemide  40 mg Intravenous Once  . haloperidol  0.5 mg Oral BID  . insulin aspart  0-9 Units Subcutaneous Q4H  . mouth rinse  15 mL Mouth Rinse q12n4p  . metoprolol  100 mg Oral BID  . mirabegron ER  50 mg Oral Daily  . multivitamin with minerals  1 tablet Oral Daily  . piperacillin-tazobactam (ZOSYN)  IV  3.375 g Intravenous Q8H  . sodium chloride flush  3 mL Intravenous Q12H  . sulfaSALAzine  500 mg Oral BID  . vitamin B-12  1,000 mcg Oral q morning - 10a  . warfarin  2 mg Oral  Once  . Warfarin - Pharmacist Dosing Inpatient   Does not apply Q24H   Continuous Infusions: . amiodarone 30 mg/hr (09/08/16 1731)     LOS: 5 days    Time spent: 35 minutes. Greater than 50% of this time was spent in direct contact with the patient coordinating care.     Chaya [name], MD [facility] Pager [phone]  If 7PM-7AM, please contact night-coverage [email or web address] Password [email or web address] 09/09/2016, 8:04 AM

## 2016-09-09 NOTE — Care Management Important Message (Signed)
Important Message  Patient Details  Name: [name] MRN: [id number] Date of Birth: 12-20-1929   Medicare Important Message Given:  Yes    Malcolm Metrohildress, Clorene Nerio Demske, RN 09/09/2016, 3:35 PM

## 2016-09-09 NOTE — Progress Notes (Signed)
Patient Name: [name] Date of Encounter: 09/09/2016  Primary Cardiologist: [name], MD  Hospital Problem List     Principal Problem:   HCAP (healthcare-associated pneumonia) Active Problems:   Atrial fibrillation, persistent (HCC)   Memory deficits   Hospital acquired PNA   Pressure injury of skin   Aspiration pneumonia of right lower lobe (HCC)   Protein-calorie malnutrition, severe   Dysphagia, oropharyngeal phase     Subjective   More alert today  Inpatient Medications    Scheduled Meds: . atorvastatin  40 mg Oral QHS  . chlorhexidine  15 mL Mouth Rinse BID  . cholecalciferol  1,000 Units Oral Daily  . digoxin  125 mcg Oral q morning - 10a  . feeding supplement (ENSURE ENLIVE)  237 mL Oral BID BM  . feeding supplement (PRO-STAT SUGAR FREE 64)  30 mL Oral BID  . furosemide  40 mg Intravenous Once  . haloperidol  0.5 mg Oral BID  . insulin aspart  0-9 Units Subcutaneous Q4H  . mouth rinse  15 mL Mouth Rinse q12n4p  . metoprolol  100 mg Oral BID  . mirabegron ER  50 mg Oral Daily  . multivitamin with minerals  1 tablet Oral Daily  . piperacillin-tazobactam (ZOSYN)  IV  3.375 g Intravenous Q8H  . sodium chloride flush  3 mL Intravenous Q12H  . sulfaSALAzine  500 mg Oral BID  . vitamin B-12  1,000 mcg Oral q morning - 10a  . warfarin  2 mg Oral Once  . Warfarin - Pharmacist Dosing Inpatient   Does not apply Q24H   Continuous Infusions: . amiodarone 30 mg/hr (09/08/16 1731)   PRN Meds: labetalol, morphine injection   Vital Signs    Vitals:   09/09/16 0400 09/09/16 0500 09/09/16 0600 09/09/16 0700  BP: 99/63 96/68 121/61 111/80  Pulse: 79 79 83 (!) 104  Resp: 17 15 18 18   Temp: 98.3 F (36.8 C)     TempSrc: Oral     SpO2: 99% 98% 96% 96%  Weight:  139 lb 12.4 oz (63.4 kg)    Height:        Intake/Output Summary (Last 24 hours) at 09/09/16 0800 Last data filed at 09/09/16 0500  Gross per 24 hour  Intake           208.85 ml  Output              1900 ml  Net         -1691.15 ml   Filed Weights   09/05/16 0100 09/09/16 0500  Weight: 136 lb 0.4 oz (61.7 kg) 139 lb 12.4 oz (63.4 kg)    Physical Exam    GEN: Elderly, chronically ill appearing HEENT: Grossly normal.  Neck: Supple, no JVD, carotid bruits, or masses. Cardiac: irreg irreg. Plus 1-2 ankle and foot edema, scrotal edema.No clubbing, cyanosis Respiratory:  Respirations regular and unlabored, decreased breath sounds bilaterally. GI: Soft, nontender, nondistended, BS + x 4. MS: no deformity or atrophy. Psych: Confused  Labs    CBC  Recent Labs  09/09/16 0413  WBC 5.2  NEUTROABS 3.1  HGB 9.9*  HCT 30.1*  MCV 96.2  PLT 238   Basic Metabolic Panel  Recent Labs  09/08/16 0454 09/08/16 0606 09/09/16 0413  NA  --  129* 129*  K  --  4.0 3.8  CL  --  95* 92*  CO2  --  28 30  GLUCOSE  --  108* 100*  BUN  --  14 16  CREATININE  --  0.54* 0.51*  CALCIUM  --  7.6* 7.5*  MG 1.2*  --   --    Liver Function Tests No results for input(s): AST, ALT, ALKPHOS, BILITOT, PROT, ALBUMIN in the last 72 hours. No results for input(s): LIPASE, AMYLASE in the last 72 hours. Cardiac Enzymes No results for input(s): CKTOTAL, CKMB, CKMBINDEX, TROPONINI in the last 72 hours. BNP Invalid input(s): POCBNP D-Dimer No results for input(s): DDIMER in the last 72 hours. Hemoglobin A1C No results for input(s): HGBA1C in the last 72 hours. Fasting Lipid Panel No results for input(s): CHOL, HDL, LDLCALC, TRIG, CHOLHDL, LDLDIRECT in the last 72 hours. Thyroid Function Tests No results for input(s): TSH, T4TOTAL, T3FREE, THYROIDAB in the last 72 hours.  Invalid input(s): FREET3  Telemetry    Atrial fibrillation 90-100, rates down in 30's yesterday - Personally Reviewed  ECG      Radiology    Dg Chest Port 1 View  Result Date: 09/09/2016 CLINICAL DATA:  CHF and pneumonia. EXAM: PORTABLE CHEST 1 VIEW COMPARISON:  09/07/2016. FINDINGS: Cardiomegaly with  pulmonary vascular congestion and bilateral pulmonary interstitial prominence consistent interstitial edema again noted. Right lower lobe atelectasis again noted. Bilateral pleural effusions right side greater than left again noted. IMPRESSION: 1. Congestive heart failure with pulmonary interstitial edema and bilateral pleural effusions, right side greater than left again noted. No significant change. 2. Persistent right lower lobe atelectasis. Electronically Signed   By: Maisie Fushomas  Register   On: 09/09/2016 07:51   Dg Chest Port 1 View  Result Date: 09/07/2016 CLINICAL DATA:  CHF EXAM: PORTABLE CHEST 1 VIEW COMPARISON:  09/04/2016 FINDINGS: Cardiac shadow is again enlarged in size. Increasing right-sided pleural effusion is noted. There is likely underlying basilar atelectasis on the right. Left lung remains clear. The degree of vascular congestion has improved somewhat in the interval from the prior exam. No acute bony abnormality is seen. IMPRESSION: Increasing right-sided pleural effusion. Electronically Signed   By: Alcide CleverMark  Lukens M.D.   On: 09/07/2016 14:22    Cardiac Studies   1 Echocardiogram 12/01/2015    Left ventricle: The cavity size was normal. Systolic function was   mildly to moderately reduced. The estimated ejection fraction was   in the range of 40% to 45%. Akinesis of the   mid-apicalanteroseptal and anterior myocardium. - Aortic valve: Valve area (Vmax): 1.64 cm^2. - Mitral valve: There was mild to moderate regurgitation. - Left atrium: The atrium was mildly dilated. - Right ventricle: The cavity size was mildly dilated. Wall   thickness was normal. Systolic function was mildly reduced. - Right atrium: The atrium was mildly to moderately dilated. - Pulmonary arteries: Systolic pressure was mildly increased. - Pericardium, extracardiac: A trivial pericardial effusion was   identified.   2. Carotid Ultrasound IMPRESSION: 1. Moderate (50- 69%) stenosis proximal right internal  carotid artery secondary to heterogeneous and mildly irregular atherosclerotic plaque. 2. Mild (1-49%) stenosis proximal left internal carotid artery secondary to heterogeneous atherosclerotic plaque. 3. Vertebral arteries are patent with normal antegrade flow. Signed,  Patient Profile       Assessment & Plan    Permanent Atrial fibrillation with RVR was on IV Amio but stopped yesterday b/c of HR in 30's, now on metoprolol 100 mg BID and digoxin(need to monitor closely given age), Coumadin-no diltiazem b/c of reduced EF.  Acute on chronic CHF EF 40-45% with increased pleural effusion given  IV Lasix 40 mg yesterday. I/O's  negative 1691 cc but weight up 3 lbs?? CXR today CHG with pulmonary edema. Not on lasix as outpatient or daily lasix here. Will give 40 mg IV today. Watch sodium-129.  HCAP on antibiotics  History of CVA  DNR    Signed, Jacolyn Reedy, PA-C  09/09/2016, 8:00 AM   Pt seen and examined. Agree with above. Will diurese with scheduled IV Lasix, 1.7 L in last 24 hrs. Monitor BP. Will avoid increasing metoprolol to home dose given issues with bradycardia with IV amiodarone yesterday. INR therapeutic. Renal function and Hgb stable.

## 2016-09-09 NOTE — Progress Notes (Signed)
ANTICOAGULATION CONSULT NOTE  Pharmacy Consult for coumadin Indication: atrial fibrillation  Allergies  Allergen Reactions  . Asa-Apap-Salicyl-Caff     *Takes warfarin*  . Caffeine     Unknown reaction  . Codeine Other (See Comments)    Unknown reaction  . Nsaids Other (See Comments)    *Due to taking Warfarin*  . Sulfonamide Derivatives Nausea And Vomiting   Patient Measurements: Height: 5\' 6"  (167.6 cm) Weight: 139 lb 12.4 oz (63.4 kg) IBW/kg (Calculated) : 63.8  Vital Signs: Temp: 98.3 F (36.8 C) (10/18 0400) Temp Source: Oral (10/18 0400) BP: 114/83 (10/18 1000) Pulse Rate: 127 (10/18 1000)  Labs:  Recent Labs  09/07/16 0635 09/08/16 0606 09/09/16 0413  HGB  --   --  9.9*  HCT  --   --  30.1*  PLT  --   --  238  LABPROT 30.5* 28.5* 26.7*  INR 2.84 2.62 2.41  CREATININE 0.49* 0.54* 0.51*   Estimated Creatinine Clearance: 62.7 mL/min (by C-G formula based on SCr of 0.51 mg/dL (L)).  Medical History: Past Medical History:  Diagnosis Date  . Anxiety   . Atrial fibrillation, persistent (HCC)   . BPH (benign prostatic hyperplasia)   . CAD (coronary artery disease)    Anterior MI and DES stenting 2004, Vfib arrest; left main 25% stenosis, LAD 80% stenosis, 99% stenosis, 25% circumflex stenosis, 25% ramus intermedius stenosis, 50-40% right coronary artery stenosis, 90% RV branch stenosis with right to left collaterals; EF 55% with mild naterior hypokinesis (underwent Taxus stenting at the time) . Stress perfusion study January 2012 EF 48% with old apical infarct  . Cataract   . Diabetes mellitus   . Diverticulosis   . Dyslipidemia   . Essential and other specified forms of tremor 05/05/2013  . GERD (gastroesophageal reflux disease)   . GIB (gastrointestinal bleeding)   . Hearing deficit    Wears hearing aids  . Hemorrhoids   . Hyperlipidemia   . Hyperplastic colon polyp   . Hypertension   . Memory deficits 05/05/2013  . Myocardial infarction   . Obesity    . Stroke (HCC)   . Tremor   . Tremor, essential 12/11/2015  . UC (ulcerative colitis confined to rectum) (HCC)    Left-sided   Medications:  Prescriptions Prior to Admission  Medication Sig Dispense Refill Last Dose  . Amino Acids-Protein Hydrolys (FEEDING SUPPLEMENT, PRO-STAT SUGAR FREE 64,) LIQD Take 30 mLs by mouth 2 (two) times daily.   09/04/2016 at Unknown time  . atorvastatin (LIPITOR) 40 MG tablet TAKE ONE TABLET AT BEDTIME 30 tablet 4 09/03/2016 at Unknown time  . Cholecalciferol (VITAMIN D) 1000 UNITS capsule Take 1 capsule (1,000 Units total) by mouth daily.   09/04/2016 at Unknown time  . digoxin (LANOXIN) 0.125 MG tablet TAKE 1 TABLET IN THE MORNING 30 tablet 4 09/04/2016 at Unknown time  . haloperidol (HALDOL) 0.5 MG tablet Take 0.5 mg by mouth 2 (two) times daily.   09/04/2016 at 800a  . hydrOXYzine (ATARAX/VISTARIL) 10 MG tablet Take one-half tablet daily as needed (Patient taking differently: Take 5 mg by mouth daily as needed for itching. ) 15 tablet 0 unknown  . metoprolol (LOPRESSOR) 100 MG tablet TAKE 1 & 1/2 TABLETS TWICE A DAY (Patient taking differently: TAKE 1 TABLET TWICE A DAY) 135 tablet 1 09/04/2016 at 800a  . mirabegron ER (MYRBETRIQ) 25 MG TB24 tablet Take 1 tablet (25 mg total) by mouth daily. (Patient taking differently: Take 50 mg by mouth  daily. ) 28 tablet 0 09/04/2016 at Unknown time  . Multiple Vitamin (MULTIVITAMIN WITH MINERALS) TABS tablet Take 1 tablet by mouth daily. 30 tablet 11 09/04/2016 at Unknown time  . nitroGLYCERIN (NITROSTAT) 0.4 MG SL tablet Place 1 tablet (0.4 mg total) under the tongue every 5 (five) minutes as needed for chest pain. 25 tablet 1 unknown  . sulfaSALAzine (AZULFIDINE) 500 MG EC tablet Take 1 tablet (500 mg total) by mouth 2 (two) times daily. Take 500 mg twice a day by mouth for 1 week, then 1000 mg BID (Patient taking differently: Take 500 mg by mouth 2 (two) times daily. ) 60 tablet 0 09/04/2016 at 800a  . vitamin B-12  (CYANOCOBALAMIN) 1000 MCG tablet TAKE 1 TABLET IN THE MORNING 30 tablet 4 09/04/2016 at Unknown time  . warfarin (COUMADIN) 2 MG tablet Take 4.5 mg by mouth every evening.   09/04/2016 at 1700   Assessment: 82 yo man admitted after choking on food.  He will continue coumadin for afib. INR therapeutic at 2.41 today despite Coumadin not given yesterday.  Coumadin was charted as not given due to pt cannot swallow.  Goal of Therapy:  INR 2-3 Monitor platelets by anticoagulation protocol: Yes   Plan:  Repeat order for Coumadin 2mg  po today if pt can tolerate / swallow Daily PT/INR Monitor for bleeding complications  Valrie HartHall, Kareem Cathey A 09/09/2016,10:26 AM

## 2016-09-09 NOTE — Progress Notes (Signed)
Palliative Medicine RN Note: Order noted for Palliative Care Consult. PMT provider is out of the office this week and will return Monday 09/14/16. Patient will be seen then if he is still inpatient.  If you need immediate recommendations in the interim, please call the PMT office at [facility] at [phone].  Margret ChanceMelanie G. Analisia Kingsford, RN, BSN, [facility] 09/09/2016 8:21 AM Cell [phone] 8:00-4:00 Monday-Friday Office [phone]

## 2016-09-10 DIAGNOSIS — I481 Persistent atrial fibrillation: Secondary | ICD-10-CM

## 2016-09-10 LAB — PROTIME-INR
INR: 2.34
PROTHROMBIN TIME: 26 s — AB (ref 11.4–15.2)

## 2016-09-10 LAB — GLUCOSE, CAPILLARY
GLUCOSE-CAPILLARY: 157 mg/dL — AB (ref 65–99)
GLUCOSE-CAPILLARY: 87 mg/dL (ref 65–99)
Glucose-Capillary: 132 mg/dL — ABNORMAL HIGH (ref 65–99)
Glucose-Capillary: 131 mg/dL — ABNORMAL HIGH (ref 65–99)
Glucose-Capillary: 108 mg/dL — ABNORMAL HIGH (ref 65–99)
Glucose-Capillary: 84 mg/dL (ref 65–99)

## 2016-09-10 LAB — CBC
HCT: 30.5 % — ABNORMAL LOW (ref 39.0–52.0)
Hemoglobin: 9.9 g/dL — ABNORMAL LOW (ref 13.0–17.0)
MCH: 31.1 pg (ref 26.0–34.0)
MCHC: 32.5 g/dL (ref 30.0–36.0)
MCV: 95.9 fL (ref 78.0–100.0)
PLATELETS: 254 10*3/uL (ref 150–400)
RBC: 3.18 MIL/uL — ABNORMAL LOW (ref 4.22–5.81)
RDW: 13.8 % (ref 11.5–15.5)
WBC: 9.9 10*3/uL (ref 4.0–10.5)

## 2016-09-10 LAB — BASIC METABOLIC PANEL
ANION GAP: 7 (ref 5–15)
BUN: 20 mg/dL (ref 6–20)
CALCIUM: 7.8 mg/dL — AB (ref 8.9–10.3)
CO2: 30 mmol/L (ref 22–32)
CREATININE: 0.92 mg/dL (ref 0.61–1.24)
Chloride: 91 mmol/L — ABNORMAL LOW (ref 101–111)
GFR calc Af Amer: >60 mL/min (ref >60)
GLUCOSE: 130 mg/dL — AB (ref 65–99)
Potassium: 3.7 mmol/L (ref 3.5–5.1)
Sodium: 128 mmol/L — ABNORMAL LOW (ref 135–145)

## 2016-09-10 MED ORDER — WARFARIN SODIUM 2 MG PO TABS: 2.0000 mg | ORAL_TABLET | Freq: Once | ORAL | Status: DC

## 2016-09-10 NOTE — Progress Notes (Signed)
Patient Name: [name] Date of Encounter: 09/10/2016  Primary Cardiologist: Rollene RotundaHochrein, James MD  Hospital Problem List     Principal Problem:   HCAP (healthcare-associated pneumonia) Active Problems:   Atrial fibrillation, persistent (HCC)   Memory deficits   Hospital acquired PNA   Pressure injury of skin   Aspiration pneumonia of right lower lobe (HCC)   Protein-calorie malnutrition, severe   Dysphagia, oropharyngeal phase   Rapid atrial fibrillation (HCC)   Acute on chronic systolic HF (heart failure) (HCC)     Subjective  No verbal response to questions, but does moan and grimace when I move his leg back onto the bed after hanging off of it.   Inpatient Medications    Scheduled Meds: . atorvastatin  40 mg Oral QHS  . chlorhexidine  15 mL Mouth Rinse BID  . cholecalciferol  1,000 Units Oral Daily  . digoxin  125 mcg Oral q morning - 10a  . feeding supplement (ENSURE ENLIVE)  237 mL Oral BID BM  . feeding supplement (PRO-STAT SUGAR FREE 64)  30 mL Oral BID  . furosemide  40 mg Intravenous Once  . furosemide  40 mg Intravenous Daily  . haloperidol  0.5 mg Oral BID  . insulin aspart  0-9 Units Subcutaneous Q4H  . mouth rinse  15 mL Mouth Rinse q12n4p  . metoprolol  100 mg Oral BID  . mirabegron ER  50 mg Oral Daily  . multivitamin with minerals  1 tablet Oral Daily  . piperacillin-tazobactam (ZOSYN)  IV  3.375 g Intravenous Q8H  . sodium chloride flush  3 mL Intravenous Q12H  . sulfaSALAzine  500 mg Oral BID  . vitamin B-12  1,000 mcg Oral q morning - 10a  . Warfarin - Pharmacist Dosing Inpatient   Does not apply Q24H   Continuous Infusions:   PRN Meds: labetalol, morphine injection   Vital Signs    Vitals:   09/10/16 0400 09/10/16 0500 09/10/16 0600 09/10/16 0718  BP: (!) 97/55 103/70 99/67   Pulse: 77 79 82 83  Resp: 18 19 19 18   Temp: 97 F (36.1 C)   97 F (36.1 C)  TempSrc: Axillary   Axillary  SpO2: 100% 100% 100% 100%  Weight: 136 lb 0.4  oz (61.7 kg)     Height:        Intake/Output Summary (Last 24 hours) at 09/10/16 0756 Last data filed at 09/10/16 0430  Gross per 24 hour  Intake               50 ml  Output              950 ml  Net             -900 ml   Filed Weights   09/05/16 0100 09/09/16 0500 09/10/16 0400  Weight: 136 lb 0.4 oz (61.7 kg) 139 lb 12.4 oz (63.4 kg) 136 lb 0.4 oz (61.7 kg)    Physical Exam    GEN: Well nourished, well developed, in no acute distress.  HEENT: Grossly normal.  Neck: Supple, no JVD, carotid bruits, or masses. Cardiac: IRRR, no murmurs, rubs, or gallops. No clubbing, cyanosis, edema.  Radials/DP/PT 2+ and equal bilaterally.  Respiratory:  Respirations regular and unlabored, clear to auscultation bilaterally. GI: Soft, nontender, nondistended, BS + x 4. MS: Contractures of his upper and lower extremities, with dressings applied to both lower legs.  Skin: warm and dry, no rash. Neuro:  Strength and sensation  are intact.Essential tremors.  Psych: Unable to evaluate.   Labs    CBC  Recent Labs  09/09/16 0413 09/10/16 0421  WBC 5.2 9.9  NEUTROABS 3.1  --   HGB 9.9* 9.9*  HCT 30.1* 30.5*  MCV 96.2 95.9  PLT 238 254     Telemetry    Atrial fib with rapid response, up to 110 bpm with frequent PVC's.   ECG    Atrial fib with rate of 84 bpm.   Radiology    Dg Chest Port 1 View  Result Date: 09/09/2016 CLINICAL DATA:  CHF and pneumonia. EXAM: PORTABLE CHEST 1 VIEW COMPARISON:  09/07/2016. FINDINGS: Cardiomegaly with pulmonary vascular congestion and bilateral pulmonary interstitial prominence consistent interstitial edema again noted. Right lower lobe atelectasis again noted. Bilateral pleural effusions right side greater than left again noted. IMPRESSION: 1. Congestive heart failure with pulmonary interstitial edema and bilateral pleural effusions, right side greater than left again noted. No significant change. 2. Persistent right lower lobe atelectasis.  Electronically Signed   By: Maisie Fus  Register   On: 09/09/2016 07:51    Cardiac Studies  1 Echocardiogram 12/01/2015 Left ventricle: The cavity size was normal. Systolic function was mildly to moderately reduced. The estimated ejection fraction was in the range of 40% to 45%. Akinesis of the mid-apicalanteroseptal and anterior myocardium. - Aortic valve: Valve area (Vmax): 1.64 cm^2. - Mitral valve: There was mild to moderate regurgitation. - Left atrium: The atrium was mildly dilated. - Right ventricle: The cavity size was mildly dilated. Wall thickness was normal. Systolic function was mildly reduced. - Right atrium: The atrium was mildly to moderately dilated. - Pulmonary arteries: Systolic pressure was mildly increased. - Pericardium, extracardiac: A trivial pericardial effusion was identified.  2. Carotid Ultrasound IMPRESSION: 1. Moderate (50- 69%) stenosis proximal right internal carotid artery secondary to heterogeneous and mildly irregular atherosclerotic plaque. 2. Mild (1-49%) stenosis proximal left internal carotid artery secondary to heterogeneous atherosclerotic plaque. 3. Vertebral arteries are patent with normal antegrade flow.   Patient Profile    82 y.o.male with known history of CAD, persistent atrial fib CHADS VASC Score of 7 on coumadin, hypertension, with other history to include GI bleed,  GERD, CVA, diabetes, hyperlipidemia,began to have rapid atrial fib with rates up to 150bpm. He was started on amiodarone gtt after bolus and is moving to the step down unit. We are asked for assistance in management. He is being treated for HCAP.    Assessment & Plan    1. Persistent Atrial fib with RVR: Amiodarone has been discontinued. He remains on metoprolol 100 mg BID. He is having episodes of rapid atrial fib, questionable PAT, with frequent PVC/s. Potassium 3.7, creatinine 0.92. He has poor po intake but able to take in po medications. Per swallowing study  in March, he is increased risk of aspiration. Now that he is off of amiodarone, consider increasing metoprolol to 125 mg BID or digoxin.Marland Kitchen He was on 150 mg BID at home. Continue warfarin per pharmacy  2. Hx of CVA: Significantly affecting his ability to swallow.   3. HCAP: Continues with antibiotic therapy.  4. DNR: Awaiting Palliative Care consultation.    Signed, Joni Reining, NP  09/10/2016, 7:56 AM   Pt seen and examined. Agree with assessment/plan as noted above, with modifications noted below.  Telemetry demonstrating "arrhythmias" which are actually artifact due to patient's Parkinsonian tremors. HR at rest in 80-90 bpm range. Would continue metoprolol and digoxin at present doses. Continue warfarin for anticoagulation.  Daily IV Lasix initiated yesterday for acute on chronic systolic heart failure with adequate diuresis.  Agree with palliative care.

## 2016-09-10 NOTE — Clinical Social Work Note (Signed)
[facility]    Maniyah Moller, Juleen ChinaHeather D, LCSW

## 2016-09-10 NOTE — Progress Notes (Signed)
ANTICOAGULATION CONSULT NOTE  Pharmacy Consult for coumadin Indication: atrial fibrillation  Allergies  Allergen Reactions  . Asa-Apap-Salicyl-Caff     *Takes warfarin*  . Caffeine     Unknown reaction  . Codeine Other (See Comments)    Unknown reaction  . Nsaids Other (See Comments)    *Due to taking Warfarin*  . Sulfonamide Derivatives Nausea And Vomiting   Patient Measurements: Height: 5\' 6"  (167.6 cm) Weight: 136 lb 0.4 oz (61.7 kg) IBW/kg (Calculated) : 63.8  Vital Signs: Temp: 97 F (36.1 C) (10/19 0718) Temp Source: Axillary (10/19 0718) BP: 99/67 (10/19 0600) Pulse Rate: 83 (10/19 0718)  Labs:  Recent Labs  09/08/16 0606 09/09/16 0413 09/10/16 0421  HGB  --  9.9* 9.9*  HCT  --  30.1* 30.5*  PLT  --  238 254  LABPROT 28.5* 26.7* 26.0*  INR 2.62 2.41 2.34  CREATININE 0.54* 0.51* 0.92   Estimated Creatinine Clearance: 53.1 mL/min (by C-G formula based on SCr of 0.92 mg/dL).  Medical History: Past Medical History:  Diagnosis Date  . Anxiety   . Atrial fibrillation, persistent (HCC)   . BPH (benign prostatic hyperplasia)   . CAD (coronary artery disease)    Anterior MI and DES stenting 2004, Vfib arrest; left main 25% stenosis, LAD 80% stenosis, 99% stenosis, 25% circumflex stenosis, 25% ramus intermedius stenosis, 50-40% right coronary artery stenosis, 90% RV branch stenosis with right to left collaterals; EF 55% with mild naterior hypokinesis (underwent Taxus stenting at the time) . Stress perfusion study January 2012 EF 48% with old apical infarct  . Cataract   . Diabetes mellitus   . Diverticulosis   . Dyslipidemia   . Essential and other specified forms of tremor 05/05/2013  . GERD (gastroesophageal reflux disease)   . GIB (gastrointestinal bleeding)   . Hearing deficit    Wears hearing aids  . Hemorrhoids   . Hyperlipidemia   . Hyperplastic colon polyp   . Hypertension   . Memory deficits 05/05/2013  . Myocardial infarction   . Obesity   .  Stroke (HCC)   . Tremor   . Tremor, essential 12/11/2015  . UC (ulcerative colitis confined to rectum) (HCC)    Left-sided   Medications:  Prescriptions Prior to Admission  Medication Sig Dispense Refill Last Dose  . Amino Acids-Protein Hydrolys (FEEDING SUPPLEMENT, PRO-STAT SUGAR FREE 64,) LIQD Take 30 mLs by mouth 2 (two) times daily.   09/04/2016 at Unknown time  . atorvastatin (LIPITOR) 40 MG tablet TAKE ONE TABLET AT BEDTIME 30 tablet 4 09/03/2016 at Unknown time  . Cholecalciferol (VITAMIN D) 1000 UNITS capsule Take 1 capsule (1,000 Units total) by mouth daily.   09/04/2016 at Unknown time  . digoxin (LANOXIN) 0.125 MG tablet TAKE 1 TABLET IN THE MORNING 30 tablet 4 09/04/2016 at Unknown time  . haloperidol (HALDOL) 0.5 MG tablet Take 0.5 mg by mouth 2 (two) times daily.   09/04/2016 at 800a  . hydrOXYzine (ATARAX/VISTARIL) 10 MG tablet Take one-half tablet daily as needed (Patient taking differently: Take 5 mg by mouth daily as needed for itching. ) 15 tablet 0 unknown  . metoprolol (LOPRESSOR) 100 MG tablet TAKE 1 & 1/2 TABLETS TWICE A DAY (Patient taking differently: TAKE 1 TABLET TWICE A DAY) 135 tablet 1 09/04/2016 at 800a  . mirabegron ER (MYRBETRIQ) 25 MG TB24 tablet Take 1 tablet (25 mg total) by mouth daily. (Patient taking differently: Take 50 mg by mouth daily. ) 28 tablet 0 09/04/2016 at  Unknown time  . Multiple Vitamin (MULTIVITAMIN WITH MINERALS) TABS tablet Take 1 tablet by mouth daily. 30 tablet 11 09/04/2016 at Unknown time  . nitroGLYCERIN (NITROSTAT) 0.4 MG SL tablet Place 1 tablet (0.4 mg total) under the tongue every 5 (five) minutes as needed for chest pain. 25 tablet 1 unknown  . sulfaSALAzine (AZULFIDINE) 500 MG EC tablet Take 1 tablet (500 mg total) by mouth 2 (two) times daily. Take 500 mg twice a day by mouth for 1 week, then 1000 mg BID (Patient taking differently: Take 500 mg by mouth 2 (two) times daily. ) 60 tablet 0 09/04/2016 at 800a  . vitamin B-12  (CYANOCOBALAMIN) 1000 MCG tablet TAKE 1 TABLET IN THE MORNING 30 tablet 4 09/04/2016 at Unknown time  . warfarin (COUMADIN) 2 MG tablet Take 4.5 mg by mouth every evening.   09/04/2016 at 1700   Assessment: 82 yo man admitted after choking on food.  He will continue coumadin for afib. INR therapeutic at 2.34 today.  Coumadin charted as given yesterday.    Goal of Therapy:  INR 2-3 Monitor platelets by anticoagulation protocol: Yes   Plan:  Repeat Coumadin 2mg  po today  Daily PT/INR Monitor for bleeding complications  Valrie Hart A 09/10/2016,8:04 AM

## 2016-09-10 NOTE — Care Management Note (Signed)
Case Management Note  Patient Details  Name: [name] MRN: [id number] Date of Birth: [birth year]  If discussed at Long Length of Stay Meetings, dates discussed:  09/10/2016  Malcolm Metrohildress, Tyashia Morrisette Demske, RN 09/10/2016, 2:27 PM

## 2016-09-10 NOTE — Progress Notes (Signed)
PROGRESS NOTE    [name]  ZOX:[id number] DOB: 08-Dec-1929 DOA: 09/04/2016 PCP: Rudi Heap, MD     Brief Narrative:  82 y/o man admitted on 10/13 for unresponsiveness following a choking episode.   Assessment & Plan:   Principal Problem:   HCAP (healthcare-associated pneumonia) Active Problems:   Atrial fibrillation, persistent (HCC)   Memory deficits   Hospital acquired PNA   Pressure injury of skin   Aspiration pneumonia of right lower lobe (HCC)   Protein-calorie malnutrition, severe   Dysphagia, oropharyngeal phase   Rapid atrial fibrillation (HCC)   Acute on chronic systolic HF (heart failure) (HCC)   Aspiration PNA -More likely than HCAP given inciting episode. -Continue zosyn. -ST on board. To continue D1 diet with thin liquids. Awaiting MBS study. -Will need to discuss ultimate GOC with family members.  Persistent A Fib -Remains in a fib with rate in the 100s. -Amiodarone drip was discontinued soon after initiation due to bradycardia. -For now remains on metoprolol 100 mg BID and digoxin. -Cards on board to provide further recommendations. -On warfarin for anticoagulation.  Parkinson's Disease -Stable thus far. -Not on any true Parkinson's meds.  HTN -BP currently stable.   DVT prophylaxis: Coumadin Code Status: DNR Family Communication: no family at bedside this am Disposition Plan: Keep in ICU, may transfer to floor this afternoon if HR remains stable. Will request palliative medicine consult given advanced age, aspiration for further discussion regarding GOC.  Consultants:   Cardiology  Procedures:   None  Antimicrobials:   Zosyn    Subjective: Sleeping, appear lethargic, not able to participate in communication  Objective: Vitals:   09/10/16 0400 09/10/16 0500 09/10/16 0600 09/10/16 0718  BP: (!) 97/55 103/70 99/67   Pulse: 77 79 82 83  Resp: 18 19 19 18   Temp: 97 F (36.1 C)   97 F (36.1 C)  TempSrc: Axillary    Axillary  SpO2: 100% 100% 100% 100%  Weight: 61.7 kg (136 lb 0.4 oz)     Height:        Intake/Output Summary (Last 24 hours) at 09/10/16 0848 Last data filed at 09/10/16 0839  Gross per 24 hour  Intake               50 ml  Output              950 ml  Net             -900 ml   Filed Weights   09/05/16 0100 09/09/16 0500 09/10/16 0400  Weight: 61.7 kg (136 lb 0.4 oz) 63.4 kg (139 lb 12.4 oz) 61.7 kg (136 lb 0.4 oz)    Examination:  General exam: drowsy Respiratory system: Clear to auscultation. Respiratory effort normal. Cardiovascular system:irregular, no M/R/G Gastrointestinal system: Abdomen is nondistended, soft and nontender. No organomegaly or masses felt. Normal bowel sounds heard. Central nervous system: Unable to fully assess given current mental state. Extremities: No C/C/E, +pedal pulses Skin: No rashes, lesions or ulcers Psychiatry: Unable to fully assess given current mental state.    Data Reviewed: I have personally reviewed following labs and imaging studies  CBC:  Recent Labs Lab 09/04/16 2000 09/05/16 0605 09/06/16 0623 09/09/16 0413 09/10/16 0421  WBC 7.0 6.2 6.5 5.2 9.9  NEUTROABS 5.3  --   --  3.1  --   HGB 10.3* 9.0* 9.6* 9.9* 9.9*  HCT 31.1* 27.3* 29.2* 30.1* 30.5*  MCV 96.0 94.8 96.1 96.2 95.9  PLT 241 187  202 238 254   Basic Metabolic Panel:  Recent Labs Lab 09/06/16 0623 09/07/16 0635 09/08/16 0454 09/08/16 0606 09/09/16 0413 09/10/16 0421  NA 129* 128*  --  129* 129* 128*  K 3.8 3.8  --  4.0 3.8 3.7  CL 96* 95*  --  95* 92* 91*  CO2 30 30  --  28 30 30   GLUCOSE 106* 97  --  108* 100* 130*  BUN 12 10  --  14 16 20   CREATININE 0.43* 0.49*  --  0.54* 0.51* 0.92  CALCIUM 7.7* 7.5*  --  7.6* 7.5* 7.8*  MG  --   --  1.2*  --   --   --    GFR: Estimated Creatinine Clearance: 53.1 mL/min (by C-G formula based on SCr of 0.92 mg/dL). Liver Function Tests: No results for input(s): AST, ALT, ALKPHOS, BILITOT, PROT, ALBUMIN in the  last 168 hours. No results for input(s): LIPASE, AMYLASE in the last 168 hours. No results for input(s): AMMONIA in the last 168 hours. Coagulation Profile:  Recent Labs Lab 09/06/16 0815 09/07/16 0635 09/08/16 0606 09/09/16 0413 09/10/16 0421  INR 2.93 2.84 2.62 2.41 2.34   Cardiac Enzymes: No results for input(s): CKTOTAL, CKMB, CKMBINDEX, TROPONINI in the last 168 hours. BNP (last 3 results) No results for input(s): PROBNP in the last 8760 hours. HbA1C: No results for input(s): HGBA1C in the last 72 hours. CBG:  Recent Labs Lab 09/09/16 1551 09/09/16 2010 09/10/16 0022 09/10/16 0413 09/10/16 0717  GLUCAP 109* 111* 157* 132* 131*   Lipid Profile: No results for input(s): CHOL, HDL, LDLCALC, TRIG, CHOLHDL, LDLDIRECT in the last 72 hours. Thyroid Function Tests: No results for input(s): TSH, T4TOTAL, FREET4, T3FREE, THYROIDAB in the last 72 hours. Anemia Panel: No results for input(s): VITAMINB12, FOLATE, FERRITIN, TIBC, IRON, RETICCTPCT in the last 72 hours. Urine analysis:    Component Value Date/Time   COLORURINE AMBER (A) 09/04/2016 2001   APPEARANCEUR CLEAR 09/04/2016 2001   LABSPEC 1.020 09/04/2016 2001   PHURINE 6.0 09/04/2016 2001   GLUCOSEU NEGATIVE 09/04/2016 2001   HGBUR NEGATIVE 09/04/2016 2001   BILIRUBINUR SMALL (A) 09/04/2016 2001   KETONESUR TRACE (A) 09/04/2016 2001   PROTEINUR NEGATIVE 09/04/2016 2001   UROBILINOGEN 2.0 (H) 09/26/2013 1407   NITRITE NEGATIVE 09/04/2016 2001   LEUKOCYTESUR NEGATIVE 09/04/2016 2001   Sepsis Labs: @LABRCNTIP (procalcitonin:4,lacticidven:4)  ) Recent Results (from the past 240 hour(s))  MRSA PCR Screening     Status: None   Collection Time: 09/05/16  1:27 AM  Result Value Ref Range Status   MRSA by PCR NEGATIVE NEGATIVE Final    Comment:        The GeneXpert MRSA Assay (FDA approved for NASAL specimens only), is one component of a comprehensive MRSA colonization surveillance program. It is not intended  to diagnose MRSA infection nor to guide or monitor treatment for MRSA infections.          Radiology Studies: Dg Chest Port 1 View  Result Date: 09/09/2016 CLINICAL DATA:  CHF and pneumonia. EXAM: PORTABLE CHEST 1 VIEW COMPARISON:  09/07/2016. FINDINGS: Cardiomegaly with pulmonary vascular congestion and bilateral pulmonary interstitial prominence consistent interstitial edema again noted. Right lower lobe atelectasis again noted. Bilateral pleural effusions right side greater than left again noted. IMPRESSION: 1. Congestive heart failure with pulmonary interstitial edema and bilateral pleural effusions, right side greater than left again noted. No significant change. 2. Persistent right lower lobe atelectasis. Electronically Signed   By: Maisie Fus  Register   On: 09/09/2016 07:51        Scheduled Meds: . atorvastatin  40 mg Oral QHS  . chlorhexidine  15 mL Mouth Rinse BID  . cholecalciferol  1,000 Units Oral Daily  . digoxin  125 mcg Oral q morning - 10a  . feeding supplement (ENSURE ENLIVE)  237 mL Oral BID BM  . feeding supplement (PRO-STAT SUGAR FREE 64)  30 mL Oral BID  . furosemide  40 mg Intravenous Daily  . haloperidol  0.5 mg Oral BID  . insulin aspart  0-9 Units Subcutaneous Q4H  . mouth rinse  15 mL Mouth Rinse q12n4p  . metoprolol  100 mg Oral BID  . mirabegron ER  50 mg Oral Daily  . multivitamin with minerals  1 tablet Oral Daily  . piperacillin-tazobactam (ZOSYN)  IV  3.375 g Intravenous Q8H  . sodium chloride flush  3 mL Intravenous Q12H  . sulfaSALAzine  500 mg Oral BID  . vitamin B-12  1,000 mcg Oral q morning - 10a  . warfarin  2 mg Oral Once  . Warfarin - Pharmacist Dosing Inpatient   Does not apply Q24H   Continuous Infusions:     LOS: 6 days    Time spent: 25 minutes. Greater than 50% of this time was spent in direct contact with the patient coordinating care.     Chaya JanHERNANDEZ ACOSTA,ESTELA, MD [facility] Pager [phone]  If  7PM-7AM, please contact night-coverage [email or web address] Password [email or web address] 09/10/2016, 8:48 AM

## 2016-09-11 ENCOUNTER — Inpatient Hospital Stay (HOSPITAL_COMMUNITY): Payer: Medicare Other

## 2016-09-11 LAB — GLUCOSE, CAPILLARY
GLUCOSE-CAPILLARY: 104 mg/dL — AB (ref 65–99)
GLUCOSE-CAPILLARY: 100 mg/dL — AB (ref 65–99)
Glucose-Capillary: 91 mg/dL (ref 65–99)
Glucose-Capillary: 142 mg/dL — ABNORMAL HIGH (ref 65–99)

## 2016-09-11 LAB — PROTIME-INR
INR: 2.56
Prothrombin Time: 28 seconds — ABNORMAL HIGH (ref 11.4–15.2)

## 2016-09-11 MED ORDER — FUROSEMIDE 40 MG PO TABS: 40.0000 mg | ORAL_TABLET | Freq: Every day | ORAL | Status: DC

## 2016-09-11 MED ORDER — LORAZEPAM 1 MG PO TABS: 1.0000 mg | ORAL_TABLET | ORAL | Status: DC | PRN

## 2016-09-11 MED ORDER — MORPHINE SULFATE (CONCENTRATE) 10 MG/0.5ML PO SOLN: 2.5000 mg | ORAL | Status: DC | PRN

## 2016-09-11 MED ORDER — WARFARIN SODIUM 2 MG PO TABS: 2.0000 mg | ORAL_TABLET | Freq: Once | ORAL | Status: DC

## 2016-09-11 MED ORDER — METOPROLOL TARTRATE 50 MG PO TABS: 125.0000 mg | ORAL_TABLET | Freq: Two times a day (BID) | ORAL | Status: DC

## 2016-09-11 NOTE — Progress Notes (Signed)
Nutrition Follow-up  DOCUMENTATION CODES:   Severe malnutrition in context of acute illness/injury  INTERVENTION:   - Pt requires assistance with feeding at all meals and when consuming supplements. - Continue Ensure Enlive oral nutrition supplement BID. Each provides 350 kcal and 20 grams protein. - Continue Pro-stat 30 mL BID. Each provides 100 kcal and 15 grams protein. - Will continue to monitor for nutrition-related needs and improvement in mental status.  NUTRITION DIAGNOSIS:   Malnutrition related to acute illness as evidenced by energy intake < or equal to 50% for > or equal to 5 days, severe depletion of muscle mass, severe depletion of body fat.  Ongoing.  GOAL:   Patient will meet greater than or equal to 90% of their needs  Unmet.  MONITOR:   PO intake, Supplement acceptance, Weight trends, Labs, Skin  ASSESSMENT:   82 y.o. male with medical history significant of DM type 2, anxiety, GERD, and HTN presents to the ED with complaints of choking while eating that occurred earlier this evening. Patient is unresponsive and is unable to give a history.  Per SLP note, pt downgraded for Dysphagia 2 to Dysphagia 1 diet order with thin liquids. Pt requires feeding assistance with all meals and supplements.  Per chart, pt consuming 0% at most meals. Noted pt consumed orange juice, chocolate Ensure, and applesauce on 09/08/16. Spoke with RN via phone who states that pt has not been alert to eat or drink anything in the past day.  Medications reviewed and include 40 mg Lipitor daily, 1,000 units cholecalciferol daily, 40 mg Lasix daily, sliding scale Novolog, MVI with minerals daily, warfarin per pharmacy  Labs reviewed and include low sodium (128 mmol/L), low chloride (91 mmol/L), low calcium (7.8 mg/dL) CBGs: 29-56284-104 mg/dL  Diet Order:  DIET - DYS 1 Room service appropriate? Yes; Fluid consistency: Thin  Skin:  Wound (see comment) (UN-PI to coccyx and right heel, st 2  penis and DTI to rt hip)  Last BM:  09/08/16  Height:   Ht Readings from Last 1 Encounters:  09/06/16 5\' 6"  (1.676 m)    Weight:   Wt Readings from Last 1 Encounters:  09/10/16 136 lb 0.4 oz (61.7 kg)    Ideal Body Weight:  65 kg  BMI:  Body mass index is 21.95 kg/m.  Estimated Nutritional Needs:   Kcal:  1860-2170   Protein:  90-105 gr  Fluid:  1.9-2.1 liters daily  EDUCATION NEEDS:   No education needs identified at this time  Rosemarie AxKate Embry Manrique Dietetic Intern Pager Number: [phone]

## 2016-09-11 NOTE — Progress Notes (Signed)
SUBJECTIVE: Sleeping.   ROS: Other than pertinent positives in "Subjective", all others were reviewed and found to be negative.   Intake/Output Summary (Last 24 hours) at 09/11/16 0804 Last data filed at 09/10/16 1722  Gross per 24 hour  Intake                0 ml  Output              100 ml  Net             -100 ml    Current Facility-Administered Medications  Medication Dose Route Frequency Provider Last Rate Last Dose  . atorvastatin (LIPITOR) tablet 40 mg  40 mg Oral QHS Houston SirenPeter Le, MD   40 mg at 09/09/16 2230  . chlorhexidine (PERIDEX) 0.12 % solution 15 mL  15 mL Mouth Rinse BID Houston SirenPeter Le, MD   15 mL at 09/10/16 2355  . cholecalciferol (VITAMIN D) tablet 1,000 Units  1,000 Units Oral Daily Houston SirenPeter Le, MD   1,000 Units at 09/09/16 0902  . digoxin (LANOXIN) tablet 125 mcg  125 mcg Oral q morning - 10a Houston SirenPeter Le, MD   125 mcg at 09/09/16 0901  . feeding supplement (ENSURE ENLIVE) (ENSURE ENLIVE) liquid 237 mL  237 mL Oral BID BM Jerald KiefStephen K Chiu, MD   237 mL at 09/10/16 1059  . feeding supplement (PRO-STAT SUGAR FREE 64) liquid 30 mL  30 mL Oral BID Jerald KiefStephen K Chiu, MD   30 mL at 09/09/16 2200  . furosemide (LASIX) injection 40 mg  40 mg Intravenous Daily Dyann KiefMichele M Lenze, PA-C   40 mg at 09/10/16 1043  . haloperidol (HALDOL) tablet 0.5 mg  0.5 mg Oral BID Houston SirenPeter Le, MD   0.5 mg at 09/09/16 2230  . insulin aspart (novoLOG) injection 0-9 Units  0-9 Units Subcutaneous Q4H Houston SirenPeter Le, MD   1 Units at 09/10/16 0809  . labetalol (NORMODYNE,TRANDATE) injection 5 mg  5 mg Intravenous Q2H PRN Jerald KiefStephen K Chiu, MD      . MEDLINE mouth rinse  15 mL Mouth Rinse q12n4p Houston SirenPeter Le, MD   15 mL at 09/10/16 1540  . metoprolol (LOPRESSOR) tablet 100 mg  100 mg Oral BID Houston SirenPeter Le, MD   100 mg at 09/09/16 2230  . mirabegron ER (MYRBETRIQ) tablet 50 mg  50 mg Oral Daily Houston SirenPeter Le, MD   50 mg at 09/09/16 0901  . morphine 2 MG/ML injection 1 mg  1 mg Intravenous Q4H PRN Jerald KiefStephen K Chiu, MD      . multivitamin  with minerals tablet 1 tablet  1 tablet Oral Daily Jerald KiefStephen K Chiu, MD   1 tablet at 09/09/16 0901  . piperacillin-tazobactam (ZOSYN) IVPB 3.375 g  3.375 g Intravenous Q8H Jerald KiefStephen K Chiu, MD   3.375 g at 09/11/16 0134  . sodium chloride flush (NS) 0.9 % injection 3 mL  3 mL Intravenous Q12H Houston SirenPeter Le, MD   3 mL at 09/10/16 2358  . sulfaSALAzine (AZULFIDINE) EC tablet 500 mg  500 mg Oral BID Houston SirenPeter Le, MD   500 mg at 09/09/16 2230  . vitamin B-12 (CYANOCOBALAMIN) tablet 1,000 mcg  1,000 mcg Oral q morning - 10a Houston SirenPeter Le, MD   1,000 mcg at 09/09/16 0902  . warfarin (COUMADIN) tablet 2 mg  2 mg Oral Once Henderson CloudEstela Y Hernandez Acosta, MD      . Warfarin - Pharmacist Dosing Inpatient   Does not apply Q24H Scheryl MartenStephen K  Rhona Leavens, MD        Vitals:   09/10/16 1634 09/10/16 1703 09/10/16 2105 09/11/16 0300  BP: (!) 245/215 137/88 (!) 95/55 (!) 94/55  Pulse: 89  (!) 104 (!) 117  Resp: (!) 103  17 15  Temp: 98.2 F (36.8 C)  98 F (36.7 C) 98.1 F (36.7 C)  TempSrc: Axillary  Oral Oral  SpO2: 95%  95% 94%  Weight:      Height:        PHYSICAL EXAM General: NAD, sleeping HEENT: Normal. Neck: No JVD, no thyromegaly.  Lungs: Diminished. CV: Nondisplaced PMI.  Mildly tachycardic, irregular rhythm, normal S1/S2, no S3, no murmur.  No pretibial edema.  Abdomen: Soft, no distention.  Neurologic: Asleep Extremities: Contractures.     LABS: Basic Metabolic Panel:  Recent Labs  16/10/96 0413 09/10/16 0421  NA 129* 128*  K 3.8 3.7  CL 92* 91*  CO2 30 30  GLUCOSE 100* 130*  BUN 16 20  CREATININE 0.51* 0.92  CALCIUM 7.5* 7.8*   Liver Function Tests: No results for input(s): AST, ALT, ALKPHOS, BILITOT, PROT, ALBUMIN in the last 72 hours. No results for input(s): LIPASE, AMYLASE in the last 72 hours. CBC:  Recent Labs  09/09/16 0413 09/10/16 0421  WBC 5.2 9.9  NEUTROABS 3.1  --   HGB 9.9* 9.9*  HCT 30.1* 30.5*  MCV 96.2 95.9  PLT 238 254   Cardiac Enzymes: No results for input(s):  CKTOTAL, CKMB, CKMBINDEX, TROPONINI in the last 72 hours. BNP: Invalid input(s): POCBNP D-Dimer: No results for input(s): DDIMER in the last 72 hours. Hemoglobin A1C: No results for input(s): HGBA1C in the last 72 hours. Fasting Lipid Panel: No results for input(s): CHOL, HDL, LDLCALC, TRIG, CHOLHDL, LDLDIRECT in the last 72 hours. Thyroid Function Tests: No results for input(s): TSH, T4TOTAL, T3FREE, THYROIDAB in the last 72 hours.  Invalid input(s): FREET3 Anemia Panel: No results for input(s): VITAMINB12, FOLATE, FERRITIN, TIBC, IRON, RETICCTPCT in the last 72 hours.  RADIOLOGY: Dg Chest Port 1 View  Result Date: 09/09/2016 CLINICAL DATA:  CHF and pneumonia. EXAM: PORTABLE CHEST 1 VIEW COMPARISON:  09/07/2016. FINDINGS: Cardiomegaly with pulmonary vascular congestion and bilateral pulmonary interstitial prominence consistent interstitial edema again noted. Right lower lobe atelectasis again noted. Bilateral pleural effusions right side greater than left again noted. IMPRESSION: 1. Congestive heart failure with pulmonary interstitial edema and bilateral pleural effusions, right side greater than left again noted. No significant change. 2. Persistent right lower lobe atelectasis. Electronically Signed   By: Maisie Fus  Register   On: 09/09/2016 07:51   Dg Chest Port 1 View  Result Date: 09/07/2016 CLINICAL DATA:  CHF EXAM: PORTABLE CHEST 1 VIEW COMPARISON:  09/04/2016 FINDINGS: Cardiac shadow is again enlarged in size. Increasing right-sided pleural effusion is noted. There is likely underlying basilar atelectasis on the right. Left lung remains clear. The degree of vascular congestion has improved somewhat in the interval from the prior exam. No acute bony abnormality is seen. IMPRESSION: Increasing right-sided pleural effusion. Electronically Signed   By: Alcide Clever M.D.   On: 09/07/2016 14:22   Dg Chest Port 1 View  Result Date: 09/04/2016 CLINICAL DATA:  Short of breath EXAM:  PORTABLE CHEST 1 VIEW COMPARISON:  06/20/2016 FINDINGS: Cardiac enlargement with vascular congestion. Probable early pulmonary edema Right lower lobe airspace disease most likely atelectasis versus infiltrate. Possible right effusion. No effusion on the left IMPRESSION: Congestive heart failure with mild edema Right lower lobe density may represent atelectasis  or infiltrate. Possible right effusion also present. Electronically Signed   By: Marlan Palau M.D.   On: 09/04/2016 20:32      ASSESSMENT AND PLAN: 1. Rapid atrial fibrillation: I will increase metoprolol to 125 mg BID. Continue digoxin. Anticoagulated with warfarin.  2. Acute on chronic systolic HF: Will switch IV diuretics to oral. Will repeat chest xray.  3. Aspiration pneumonia: Continue antibiotics.  4. HTN: Low normal. Monitor given increase in metoprolol dose.    Prentice Docker, M.D., F.A.C.C.

## 2016-09-11 NOTE — Clinical Social Work Note (Signed)
Patient Information   Patient Name [name], [name] ([id number]) Sex Male DOB 1929-11-30  Room Bed  A316 A316-01  Patient Demographics   Address C[address]O Francesco Runner [address] [place] [address] Phone [phone] (Home) *Preferred* [phone] (Mobile) E-mail Address [email or web address]  Patient Ethnicity & Race   Ethnic Group Patient Race  Not Hispanic or Latino White or Caucasian  Emergency Contact(s)   Name Relation Home Work Mobile  [place] [phone]  [phone]  Wilinski,Corinne Spouse [phone]    Documents on File    Status Date Received Description  Documents for the Patient  EMR Medication Summary Not Received    EMR Problem Summary Not Received    EMR Patient Summary Not Received    [place] HIPAA NOTICE OF PRIVACY - Scanned Received 09/10/03   [place] E-Signature HIPAA Notice of Privacy Received 05/18/11   [place] E-Signature HIPAA Notice of Privacy Spanish Not Received    Driver's License Not Received    Insurance Card Received  OLD CARD   Advance Directives/Living Will/HCPOA/POA Received 08/01/14 POA/wrfm/jd  Insurance Card Not Received    Technical brewer Not Received    Insurance Card Received 03/17/12   Insurance Card Received 03/17/12   Insurance Card Not Received    Insurance Card Received 03/17/12   Insurance Card Not Received    Insurance Card Received 02/03/13   Driver's License Not Received    HIM ROI Authorization Not Received  PGBA Prepayment Review- CPT Code [phone]  Advanced Beneficiary Notice (ABN) Not Received    Insurance Card Not Received    Insurance Card Received  OLD CARD  Insurance Card Received 09/08/13   [place] HIPAA NOTICE OF PRIVACY - Scanned Not Received    AMB Correspondence Not Received  06/14 MMSE [facility]  Insurance Card Received 04/17/15 2016 ins  AMB Correspondence  04/02/14 11/14 MMSE GNA  AMB Correspondence  04/04/14 [facility]  E-Signature AOB Spanish  Not Received    AMB Correspondence  08/01/14 [facility]  AMB Correspondence  08/13/14 [facility] REFERRAL [place]ORE MD  AMB Correspondence  08/28/14 LETTER [facility]  Release of Information  09/26/14   HIM ROI Authorization  10/05/14 ROI  AMB Correspondence  10/03/14 LETTER [facility]  Other Photo ID Not Received    AMB Correspondence  08/27/14 EXAMINATION RECORD MY EYEDR OPTOMETRY OF [place]  Advance Directives/Living Will/HCPOA/POA Received 09/05/15 chmg/nl/bv  HIM ROI Authorization (Expired) 11/13/15 Notes for dos [id number]  VVS Policy for Pain - E Signature     Advance Directives/Living Will/HCPOA/POA  12/05/15   Advance Directives/Living Will/HCPOA/POA  12/05/15   Insurance Card Received 12/10/15 [facility]HC  Advance Directives/Living Will/HCPOA/POA Received 12/11/15 POA/kf/vvs  Insurance Card     Insurance Card Received 12/11/15 GNA  Advanced Beneficiary Notice (ABN) Received 12/12/15 GNA/EEG  AMB Correspondence  12/23/15 LETTER [facility]  AMB HH/NH/Hospice  12/05/15 CERTIFICATION/POC [facility]  HIM ROI Authorization (Expired) 04/06/16 Authorization for batch [facility]/[employer] Medicare Risk Adjustment 04/06/16  fbg  AMB Correspondence (Deleted) 07/04/13 MMSE [facility]  AMB Correspondence (Deleted) 04/02/14 11/14 MMSE [facility]  AMB Correspondence (Deleted) 04/03/14 11/14 MMSE [facility]  AMB Correspondence (Deleted) 04/02/14 FILED IN ERROR  Documents for the Encounter  AOB (Assignment of Insurance Benefits) Received 09/10/16 pt transported via ambulance unable to sign due to condition  E-signature AOB     MEDICARE RIGHTS Received 09/10/16 pt transported via ambulance unable to sign due to  condition  E-signature Medicare Rights     Cardiac Monitoring Strip  09/05/16   ED Patient Billing Extract   ED PB Summary  ED Patient Billing Extract   ED Encounter Summary  Correspondence   09/07/16   ED Patient Billing Extract   ED PB Summary  EKG  09/07/16   Admission Information   Attending Provider Admitting Provider Admission Type Admission Date/Time  Estela Isaiah BlakesY Hernandez Acosta, MD Houston SirenPeter Le, MD Emergency 09/04/16 1943  Discharge Date Hospital Service Auth/Cert Status Service Area   Internal Medicine Incomplete [place] SERVICE AREA  Unit Room/Bed Admission Status   AP-DEPT 300 A316/A316-01 Admission (Confirmed)   Admission   Complaint  altered mental status  Hospital Account   Name Acct ID Class Status Primary Coverage  Elray McgregorRumley, Zariah D [id number] Inpatient Open [facility]CARE [facility]IC[facility]      Guarantor Account (for Hospital Account [id number])   Name Relation to Pt Service Area Active? Acct Type  Gerre Scullumley, Verlan D Self CHSA Yes Personal/Family  Address Phone    C[address]O [name] [address] [place], [place] [address] [phone])        Coverage Information (for Hospital Account [id number])   1. Cleatrice BurkeUNITED HEALTHCARE MEDICARE/UHC MEDICARE   F/O Payor/Plan Precert #  [facility]IC[facility]   Subscriber Subscriber #  Elray McgregorRumley, Pervis D [id number]  Address Phone  [address] [place], [place] [id number] [phone]  2. MEDICAID [place]/MEDICAID OF [place]   F/O Payor/Plan Precert #  MEDICAID [place]/MEDICAID OF    Subscriber Subscriber #  Elray McgregorRumley, Rigoberto D [id number]  Address Phone  [address] [address] [place], [place] [address]

## 2016-09-11 NOTE — Care Management Important Message (Signed)
Important Message  Patient Details  Name: Elray Mcgregoraul D Court MRN: [id number] Date of Birth: 1930/07/01   Medicare Important Message Given:  Yes    Malcolm Metrohildress, Tabithia Stroder Demske, RN 09/11/2016, 1:00 PM

## 2016-09-11 NOTE — Progress Notes (Signed)
PROGRESS NOTE    [name]  ZHY:[id number] DOB: March 18, 1930 DOA: 09/04/2016 PCP: Rudi Heap, MD     Brief Narrative:  83 y/o man admitted on 10/13 for unresponsiveness following a choking episode.   Assessment & Plan:   Principal Problem:   HCAP (healthcare-associated pneumonia) Active Problems:   Atrial fibrillation, persistent (HCC)   Memory deficits   Hospital acquired PNA   Pressure injury of skin   Aspiration pneumonia of right lower lobe (HCC)   Protein-calorie malnutrition, severe   Dysphagia, oropharyngeal phase   Rapid atrial fibrillation (HCC)   Acute on chronic systolic HF (heart failure) (HCC)   Aspiration PNA -More likely than HCAP given inciting episode. -ST on board. To continue D1 diet with thin liquids. Awaiting MBS study. -After discussion with nephew, who is the HCPOA, we will discontinue all meds except comfort meds.  Persistent A Fib -Since transitioning to comfort care and inability to take POs, will DC all heart medications.  Parkinson's Disease -Stable thus far. -Not on any true Parkinson's meds.  HTN -BP currently stable.   DVT prophylaxis: Coumadin Code Status: DNR Family Communication: no family at bedside this am Disposition Plan: Discussed with nephew, HCPOA. We agree on transitioning to full comfort care and give comfort meds only. Discussed with SW residential hospice placement. No beds today. Consultants:   Cardiology  Procedures:   None  Antimicrobials:   Zosyn (will DC 10/20)   Subjective: Sleeping, appear lethargic, not able to participate in communication  Objective: Vitals:   09/10/16 1634 09/10/16 1703 09/10/16 2105 09/11/16 0300  BP: (!) 245/215 137/88 (!) 95/55 (!) 94/55  Pulse: 89  (!) 104 (!) 117  Resp: (!) 103  17 15  Temp: 98.2 F (36.8 C)  98 F (36.7 C) 98.1 F (36.7 C)  TempSrc: Axillary  Oral Oral  SpO2: 95%  95% 94%  Weight:      Height:        Intake/Output Summary (Last 24 hours)  at 09/11/16 1403 Last data filed at 09/10/16 1722  Gross per 24 hour  Intake                0 ml  Output              100 ml  Net             -100 ml   Filed Weights   09/05/16 0100 09/09/16 0500 09/10/16 0400  Weight: 61.7 kg (136 lb 0.4 oz) 63.4 kg (139 lb 12.4 oz) 61.7 kg (136 lb 0.4 oz)    Examination:  General exam: drowsy Respiratory system: Clear to auscultation. Respiratory effort normal. Cardiovascular system:irregular, no M/R/G Gastrointestinal system: Abdomen is nondistended, soft and nontender. No organomegaly or masses felt. Normal bowel sounds heard. Central nervous system: Unable to fully assess given current mental state. Extremities: No C/C/E, +pedal pulses Skin: No rashes, lesions or ulcers Psychiatry: Unable to fully assess given current mental state.    Data Reviewed: I have personally reviewed following labs and imaging studies  CBC:  Recent Labs Lab 09/04/16 2000 09/05/16 0605 09/06/16 0623 09/09/16 0413 09/10/16 0421  WBC 7.0 6.2 6.5 5.2 9.9  NEUTROABS 5.3  --   --  3.1  --   HGB 10.3* 9.0* 9.6* 9.9* 9.9*  HCT 31.1* 27.3* 29.2* 30.1* 30.5*  MCV 96.0 94.8 96.1 96.2 95.9  PLT 241 187 202 238 254   Basic Metabolic Panel:  Recent Labs Lab 09/06/16 0623 09/07/16 2952  09/08/16 0454 09/08/16 0606 09/09/16 0413 09/10/16 0421  NA 129* 128*  --  129* 129* 128*  K 3.8 3.8  --  4.0 3.8 3.7  CL 96* 95*  --  95* 92* 91*  CO2 30 30  --  28 30 30   GLUCOSE 106* 97  --  108* 100* 130*  BUN 12 10  --  14 16 20   CREATININE 0.43* 0.49*  --  0.54* 0.51* 0.92  CALCIUM 7.7* 7.5*  --  7.6* 7.5* 7.8*  MG  --   --  1.2*  --   --   --    GFR: Estimated Creatinine Clearance: 53.1 mL/min (by C-G formula based on SCr of 0.92 mg/dL). Liver Function Tests: No results for input(s): AST, ALT, ALKPHOS, BILITOT, PROT, ALBUMIN in the last 168 hours. No results for input(s): LIPASE, AMYLASE in the last 168 hours. No results for input(s): AMMONIA in the last 168  hours. Coagulation Profile:  Recent Labs Lab 09/07/16 0635 09/08/16 0606 09/09/16 0413 09/10/16 0421 09/11/16 0542  INR 2.84 2.62 2.41 2.34 2.56   Cardiac Enzymes: No results for input(s): CKTOTAL, CKMB, CKMBINDEX, TROPONINI in the last 168 hours. BNP (last 3 results) No results for input(s): PROBNP in the last 8760 hours. HbA1C: No results for input(s): HGBA1C in the last 72 hours. CBG:  Recent Labs Lab 09/10/16 2053 09/11/16 0111 09/11/16 0403 09/11/16 0723 09/11/16 1152  GLUCAP 87 104* 100* 91 142*   Lipid Profile: No results for input(s): CHOL, HDL, LDLCALC, TRIG, CHOLHDL, LDLDIRECT in the last 72 hours. Thyroid Function Tests: No results for input(s): TSH, T4TOTAL, FREET4, T3FREE, THYROIDAB in the last 72 hours. Anemia Panel: No results for input(s): VITAMINB12, FOLATE, FERRITIN, TIBC, IRON, RETICCTPCT in the last 72 hours. Urine analysis:    Component Value Date/Time   COLORURINE AMBER (A) 09/04/2016 2001   APPEARANCEUR CLEAR 09/04/2016 2001   LABSPEC 1.020 09/04/2016 2001   PHURINE 6.0 09/04/2016 2001   GLUCOSEU NEGATIVE 09/04/2016 2001   HGBUR NEGATIVE 09/04/2016 2001   BILIRUBINUR SMALL (A) 09/04/2016 2001   KETONESUR TRACE (A) 09/04/2016 2001   PROTEINUR NEGATIVE 09/04/2016 2001   UROBILINOGEN 2.0 (H) 09/26/2013 1407   NITRITE NEGATIVE 09/04/2016 2001   LEUKOCYTESUR NEGATIVE 09/04/2016 2001   Sepsis Labs: @LABRCNTIP (procalcitonin:4,lacticidven:4)  ) Recent Results (from the past 240 hour(s))  MRSA PCR Screening     Status: None   Collection Time: 09/05/16  1:27 AM  Result Value Ref Range Status   MRSA by PCR NEGATIVE NEGATIVE Final    Comment:        The GeneXpert MRSA Assay (FDA approved for NASAL specimens only), is one component of a comprehensive MRSA colonization surveillance program. It is not intended to diagnose MRSA infection nor to guide or monitor treatment for MRSA infections.          Radiology Studies: Dg Chest Port  1 View  Result Date: 09/11/2016 CLINICAL DATA:  Congestive heart failure. EXAM: PORTABLE CHEST 1 VIEW COMPARISON:  09/09/2016 . FINDINGS: Cardiomegaly with diffuse bilateral pulmonary interstitial prominence and bilateral pleural effusions consistent congestive heart failure. Findings have progressed slightly from prior exam. Persistent atelectatic changes both lung bases. No pneumothorax. IMPRESSION: Congestive heart failure bilateral from interstitial edema pleural effusions, progressive from prior exam. Persistent atelectatic changes both lung bases. Electronically Signed   By: Maisie Fus  Register   On: 09/11/2016 09:26        Scheduled Meds: . atorvastatin  40 mg Oral QHS  . chlorhexidine  15 mL Mouth Rinse BID  . cholecalciferol  1,000 Units Oral Daily  . digoxin  125 mcg Oral q morning - 10a  . feeding supplement (ENSURE ENLIVE)  237 mL Oral BID BM  . feeding supplement (PRO-STAT SUGAR FREE 64)  30 mL Oral BID  . furosemide  40 mg Oral Daily  . haloperidol  0.5 mg Oral BID  . insulin aspart  0-9 Units Subcutaneous Q4H  . mouth rinse  15 mL Mouth Rinse q12n4p  . metoprolol  125 mg Oral BID  . mirabegron ER  50 mg Oral Daily  . multivitamin with minerals  1 tablet Oral Daily  . piperacillin-tazobactam (ZOSYN)  IV  3.375 g Intravenous Q8H  . sodium chloride flush  3 mL Intravenous Q12H  . sulfaSALAzine  500 mg Oral BID  . vitamin B-12  1,000 mcg Oral q morning - 10a  . warfarin  2 mg Oral Once  . warfarin  2 mg Oral Once  . Warfarin - Pharmacist Dosing Inpatient   Does not apply Q24H   Continuous Infusions:     LOS: 7 days    Time spent: 25 minutes. Greater than 50% of this time was spent in direct contact with the patient coordinating care.     Chaya JanHERNANDEZ ACOSTA,ESTELA, MD [facility] Pager [phone]  If 7PM-7AM, please contact night-coverage [email or web address] Password [email or web address] 09/11/2016, 2:03 PM

## 2016-09-11 NOTE — Progress Notes (Addendum)
ANTICOAGULATION CONSULT NOTE  Pharmacy Consult for coumadin Indication: atrial fibrillation  Allergies  Allergen Reactions  . Asa-Apap-Salicyl-Caff     *Takes warfarin*  . Caffeine     Unknown reaction  . Codeine Other (See Comments)    Unknown reaction  . Nsaids Other (See Comments)    *Due to taking Warfarin*  . Sulfonamide Derivatives Nausea And Vomiting   Patient Measurements: Height: 5\' 6"  (167.6 cm) Weight: 136 lb 0.4 oz (61.7 kg) IBW/kg (Calculated) : 63.8  Vital Signs: Temp: 98.1 F (36.7 C) (10/20 0300) Temp Source: Oral (10/20 0300) BP: 94/55 (10/20 0300) Pulse Rate: 117 (10/20 0300)  Labs:  Recent Labs  09/09/16 0413 09/10/16 0421 09/11/16 0542  HGB 9.9* 9.9*  --   HCT 30.1* 30.5*  --   PLT 238 254  --   LABPROT 26.7* 26.0* 28.0*  INR 2.41 2.34 2.56  CREATININE 0.51* 0.92  --    Estimated Creatinine Clearance: 53.1 mL/min (by C-G formula based on SCr of 0.92 mg/dL).  Medical History: Past Medical History:  Diagnosis Date  . Anxiety   . Atrial fibrillation, persistent (HCC)   . BPH (benign prostatic hyperplasia)   . CAD (coronary artery disease)    Anterior MI and DES stenting 2004, Vfib arrest; left main 25% stenosis, LAD 80% stenosis, 99% stenosis, 25% circumflex stenosis, 25% ramus intermedius stenosis, 50-40% right coronary artery stenosis, 90% RV branch stenosis with right to left collaterals; EF 55% with mild naterior hypokinesis (underwent Taxus stenting at the time) . Stress perfusion study January 2012 EF 48% with old apical infarct  . Cataract   . Diabetes mellitus   . Diverticulosis   . Dyslipidemia   . Essential and other specified forms of tremor 05/05/2013  . GERD (gastroesophageal reflux disease)   . GIB (gastrointestinal bleeding)   . Hearing deficit    Wears hearing aids  . Hemorrhoids   . Hyperlipidemia   . Hyperplastic colon polyp   . Hypertension   . Memory deficits 05/05/2013  . Myocardial infarction   . Obesity   .  Stroke (HCC)   . Tremor   . Tremor, essential 12/11/2015  . UC (ulcerative colitis confined to rectum) (HCC)    Left-sided   Medications:  Prescriptions Prior to Admission  Medication Sig Dispense Refill Last Dose  . Amino Acids-Protein Hydrolys (FEEDING SUPPLEMENT, PRO-STAT SUGAR FREE 64,) LIQD Take 30 mLs by mouth 2 (two) times daily.   09/04/2016 at Unknown time  . atorvastatin (LIPITOR) 40 MG tablet TAKE ONE TABLET AT BEDTIME 30 tablet 4 09/03/2016 at Unknown time  . Cholecalciferol (VITAMIN D) 1000 UNITS capsule Take 1 capsule (1,000 Units total) by mouth daily.   09/04/2016 at Unknown time  . digoxin (LANOXIN) 0.125 MG tablet TAKE 1 TABLET IN THE MORNING 30 tablet 4 09/04/2016 at Unknown time  . haloperidol (HALDOL) 0.5 MG tablet Take 0.5 mg by mouth 2 (two) times daily.   09/04/2016 at 800a  . hydrOXYzine (ATARAX/VISTARIL) 10 MG tablet Take one-half tablet daily as needed (Patient taking differently: Take 5 mg by mouth daily as needed for itching. ) 15 tablet 0 unknown  . metoprolol (LOPRESSOR) 100 MG tablet TAKE 1 & 1/2 TABLETS TWICE A DAY (Patient taking differently: TAKE 1 TABLET TWICE A DAY) 135 tablet 1 09/04/2016 at 800a  . mirabegron ER (MYRBETRIQ) 25 MG TB24 tablet Take 1 tablet (25 mg total) by mouth daily. (Patient taking differently: Take 50 mg by mouth daily. ) 28 tablet 0  09/04/2016 at Unknown time  . Multiple Vitamin (MULTIVITAMIN WITH MINERALS) TABS tablet Take 1 tablet by mouth daily. 30 tablet 11 09/04/2016 at Unknown time  . nitroGLYCERIN (NITROSTAT) 0.4 MG SL tablet Place 1 tablet (0.4 mg total) under the tongue every 5 (five) minutes as needed for chest pain. 25 tablet 1 unknown  . sulfaSALAzine (AZULFIDINE) 500 MG EC tablet Take 1 tablet (500 mg total) by mouth 2 (two) times daily. Take 500 mg twice a day by mouth for 1 week, then 1000 mg BID (Patient taking differently: Take 500 mg by mouth 2 (two) times daily. ) 60 tablet 0 09/04/2016 at 800a  . vitamin B-12  (CYANOCOBALAMIN) 1000 MCG tablet TAKE 1 TABLET IN THE MORNING 30 tablet 4 09/04/2016 at Unknown time  . warfarin (COUMADIN) 2 MG tablet Take 4.5 mg by mouth every evening.   09/04/2016 at 1700   Assessment: 82 yo man admitted after choking on food.  He will continue coumadin for afib. INR therapeutic at 2.56 today.  Coumadin dose not  charted as given yesterday.    Goal of Therapy:  INR 2-3 Monitor platelets by anticoagulation protocol: Yes   Plan:  Repeat Coumadin 2mg  po today  Daily PT/INR Monitor for bleeding complications  [name], BS Loura Back, BCPS Clinical Pharmacist Pager [phone] 09/11/2016,9:55 AM

## 2016-09-11 NOTE — Progress Notes (Signed)
Pharmacy Antibiotic Note  [name] is a 82 y.o. male admitted on 09/04/2016 with aspiration pneumonia.  Pharmacy has been consulted for Zosyn dosing.  Plan: Continue Zosyn 3.375g IV q8h (4 hour infusion). F/U LOT Monitor clinical progress and cultures  Height: 5\' 6"  (167.6 cm) Weight: 136 lb 0.4 oz (61.7 kg) IBW/kg (Calculated) : 63.8  Temp (24hrs), Avg:97.9 F (36.6 C), Min:97.2 F (36.2 C), Max:98.2 F (36.8 C)   Recent Labs Lab 09/04/16 2000 09/04/16 2006 09/05/16 0605 09/05/16 0808 09/06/16 0623 09/07/16 0635 09/08/16 0606 09/09/16 0413 09/10/16 0421  WBC 7.0  --  6.2  --  6.5  --   --  5.2 9.9  CREATININE 0.45*  --  0.39*  --  0.43* 0.49* 0.54* 0.51* 0.92  LATICACIDVEN  --  2.71*  --  1.0  --   --   --   --   --     Estimated Creatinine Clearance: 53.1 mL/min (by C-G formula based on SCr of 0.92 mg/dL).    Allergies  Allergen Reactions  . Asa-Apap-Salicyl-Caff     *Takes warfarin*  . Caffeine     Unknown reaction  . Codeine Other (See Comments)    Unknown reaction  . Nsaids Other (See Comments)    *Due to taking Warfarin*  . Sulfonamide Derivatives Nausea And Vomiting    Antimicrobials this admission: Zosyn 10/14 >>   Dose adjustments this admission: N/A  Microbiology results: 10/14 MRSA PCR: negative  Thank you for allowing pharmacy to be a part of this patient's care.  Elder CyphersLorie Dorothee Napierkowski, BS Pharm D, [place] Clinical Pharmacist Pager [phone] 09/11/2016 9:51 AM

## 2016-09-11 NOTE — Progress Notes (Signed)
Speech Language Pathology Treatment: Dysphagia  Patient Details Name: [name] MRN: [id number] DOB: 12-01-1929 Today's Date: 09/11/2016 Time: [id number] SLP Time Calculation (min) (ACUTE ONLY): 25 min  Assessment / Plan / Recommendation Clinical Impression  [name] was seen at bedside for diet toleration and to assess appropriateness for MBSS. He is not as alert this date and required mod/max cues to sustain alertness for po trials, however he did eat breakfast earlier this AM with CNA. [name] has known pharyngoesophageal phase dysphagia from MBSS completed in March 2017 showing Zenker's diverticulum. He was downgraded to D1/puree and NTL at his SNF recently and he is been tolerating D1/puree with thin liquids when he is alert enough to take po this admission. Chest x-ray this date shows: Congestive heart failure bilateral from interstitial edema pleural effusions, progressive from prior exam. Persistent atelectatic changes both lung bases Pt is at risk for developing aspiration PNA given bedbound status since admission, dependent feeder status, and known pharyngoesophageal phase dysphagia. Acknowledge palliative care consult placed, which is appropriate to help establish goals of care. No family present at this time. SLP suggested MBSS to assess whether nectars vs thins were better suited for pt. Aspiration of thickened liquids are potentially more dangerous to a patient. Ideally, pt would be placed on pureed and thin liquids with aggressive oral care. Will see if radiology can take us today. If not, continue with diet as ordered and consider MBSS on Monday AM if pt still here.    HPI HPI: Gregory Dickaul D Rumleyis a 82 y.o.malewith medical history significant of DM type 2, anxiety, GERD, and HTN presents to the ED with complaints of choking while eating that occurred earlier this evening. Patient is unresponsive and is unable to give a history. CXR shows CHF with mild edema along with right lower  lobe density may represent atelectasis or infiltrate.      SLP Plan  Continue with current plan of care;MBS (Will attempt this date if radiology can fit us in; pend aler)     Recommendations  Diet recommendations: Dysphagia 1 (puree);Thin liquid Liquids provided via: Cup;Straw Medication Administration: Crushed with puree Supervision: Staff to assist with self feeding;Full supervision/cueing for compensatory strategies Compensations: Slow rate;Small sips/bites;Multiple dry swallows after each bite/sip;Follow solids with liquid Postural Changes and/or Swallow Maneuvers: Seated upright 90 degrees;Upright 30-60 min after meal                Oral Care Recommendations: Oral care BID;Staff/trained caregiver to provide oral care Follow up Recommendations: Skilled Nursing facility Plan: Continue with current plan of care;MBS (Will attempt this date if radiology can fit us in; pend aler)       GO                [name] 09/11/2016, 9:39 AM

## 2016-09-11 NOTE — Clinical Social Work Note (Signed)
Per MD, after discussion with pt's nephew/HCPOA [name], pt will be made comfort care. [name] [place] Referral will be faxed. CSW confirmed information with [name].   Derenda FennelKara Heidie Krall, LCSW [phone]

## 2016-09-12 MED ORDER — MORPHINE SULFATE (CONCENTRATE) 10 MG/0.5ML PO SOLN: 2.5000 mg | ORAL | 0 refills | Status: AC | PRN

## 2016-09-12 MED ORDER — HALOPERIDOL 0.5 MG PO TABS: 0.5000 mg | ORAL_TABLET | Freq: Two times a day (BID) | ORAL | 0 refills | Status: AC

## 2016-09-12 MED ORDER — LORAZEPAM 1 MG PO TABS: 1.0000 mg | ORAL_TABLET | ORAL | 0 refills | Status: AC | PRN

## 2016-09-12 NOTE — Discharge Summary (Signed)
Physician Discharge Summary  [name] ZOX:[id number] DOB: 1930-06-17 DOA: 09/04/2016  PCP: Rudi Heap, MD  Admit date: 09/04/2016 Discharge date: 09/12/2016  Time spent: 45 minutes  Recommendations for Outpatient Follow-up:  -Will be discharged to residential hospice today for end of life care.   Discharge Diagnoses:  Principal Problem:   HCAP (healthcare-associated pneumonia) Active Problems:   Atrial fibrillation, persistent (HCC)   Memory deficits   Hospital acquired PNA   Pressure injury of skin   Aspiration pneumonia of right lower lobe (HCC)   Protein-calorie malnutrition, severe   Dysphagia, oropharyngeal phase   Rapid atrial fibrillation (HCC)   Acute on chronic systolic HF (heart failure) (HCC)   Discharge Condition: Guarded  Filed Weights   09/05/16 0100 09/09/16 0500 09/10/16 0400  Weight: 61.7 kg (136 lb 0.4 oz) 63.4 kg (139 lb 12.4 oz) 61.7 kg (136 lb 0.4 oz)    History of present illness:  As per Dr. Conley Rolls on 10/13: [name] is a 82 y.o. male with medical history significant of DM type 2, anxiety, GERD, and HTN presents to the ED with complaints of choking while eating that occurred earlier this evening. Patient is unresponsive and is unable to give a history.  ED Course: While in the ED, sodium 129, chloride 92, Cr 0.45, calcium 7.9, Lactic acid 2.71, Hgb 10.3. PO2 of 325. UA unremarkable. CXR shows CHF with mild edema along with right lower lobe density may represent atelectasis or infiltrate. EKG shows A-fib with RVR. Will admit for evaluation.  Hospital Course:   Aspiration PNA -More likely than HCAP given inciting episode. -ST on board. To continue D1 diet with thin liquids. Awaiting MBS study. -After discussion with nephew, who is the HCPOA, we will discontinue all meds except comfort meds as we will be transitioning to comfort care. -He does not want to treat further episodes of PNA nor does he desire  re-hospitalization.  Persistent A Fib -Since transitioning to comfort care and inability to take POs, will DC all heart medications.  Parkinson's Disease -Stable thus far. -Not on any true Parkinson's meds.  HTN -BP currently stable.   Procedures:  None   Consultations:  Cardiology  Discharge Instructions  Discharge Instructions    Diet - low sodium heart healthy    Complete by:  As directed    Increase activity slowly    Complete by:  As directed        Medication List    STOP taking these medications   atorvastatin 40 MG tablet Commonly known as:  LIPITOR   digoxin 0.125 MG tablet Commonly known as:  LANOXIN   hydrOXYzine 10 MG tablet Commonly known as:  ATARAX/VISTARIL   metoprolol 100 MG tablet Commonly known as:  LOPRESSOR   mirabegron ER 25 MG Tb24 tablet Commonly known as:  MYRBETRIQ   multivitamin with minerals Tabs tablet   nitroGLYCERIN 0.4 MG SL tablet Commonly known as:  NITROSTAT   sulfaSALAzine 500 MG EC tablet Commonly known as:  AZULFIDINE   vitamin B-12 1000 MCG tablet Commonly known as:  CYANOCOBALAMIN   Vitamin D 1000 units capsule   warfarin 2 MG tablet Commonly known as:  COUMADIN     TAKE these medications   feeding supplement (PRO-STAT SUGAR FREE 64) Liqd Take 30 mLs by mouth 2 (two) times daily.   haloperidol 0.5 MG tablet Commonly known as:  HALDOL Take 1 tablet (0.5 mg total) by mouth 2 (two) times daily.   LORazepam  1 MG tablet Commonly known as:  ATIVAN Take 1 tablet (1 mg total) by mouth every 2 (two) hours as needed for anxiety.   morphine CONCENTRATE 10 MG/0.5ML Soln concentrated solution Take 0.13 mLs (2.6 mg total) by mouth every 2 (two) hours as needed for moderate pain.      Allergies  Allergen Reactions  . Asa-Apap-Salicyl-Caff     *Takes warfarin*  . Caffeine     Unknown reaction  . Codeine Other (See Comments)    Unknown reaction  . Nsaids Other (See Comments)    *Due to taking  Warfarin*  . Sulfonamide Derivatives Nausea And Vomiting      The results of significant diagnostics from this hospitalization (including imaging, microbiology, ancillary and laboratory) are listed below for reference.    Significant Diagnostic Studies: Dg Chest Port 1 View  Result Date: 09/11/2016 CLINICAL DATA:  Congestive heart failure. EXAM: PORTABLE CHEST 1 VIEW COMPARISON:  09/09/2016 . FINDINGS: Cardiomegaly with diffuse bilateral pulmonary interstitial prominence and bilateral pleural effusions consistent congestive heart failure. Findings have progressed slightly from prior exam. Persistent atelectatic changes both lung bases. No pneumothorax. IMPRESSION: Congestive heart failure bilateral from interstitial edema pleural effusions, progressive from prior exam. Persistent atelectatic changes both lung bases. Electronically Signed   By: Maisie Fus  Register   On: 09/11/2016 09:26   Dg Chest Port 1 View  Result Date: 09/09/2016 CLINICAL DATA:  CHF and pneumonia. EXAM: PORTABLE CHEST 1 VIEW COMPARISON:  09/07/2016. FINDINGS: Cardiomegaly with pulmonary vascular congestion and bilateral pulmonary interstitial prominence consistent interstitial edema again noted. Right lower lobe atelectasis again noted. Bilateral pleural effusions right side greater than left again noted. IMPRESSION: 1. Congestive heart failure with pulmonary interstitial edema and bilateral pleural effusions, right side greater than left again noted. No significant change. 2. Persistent right lower lobe atelectasis. Electronically Signed   By: Maisie Fus  Register   On: 09/09/2016 07:51   Dg Chest Port 1 View  Result Date: 09/07/2016 CLINICAL DATA:  CHF EXAM: PORTABLE CHEST 1 VIEW COMPARISON:  09/04/2016 FINDINGS: Cardiac shadow is again enlarged in size. Increasing right-sided pleural effusion is noted. There is likely underlying basilar atelectasis on the right. Left lung remains clear. The degree of vascular congestion has  improved somewhat in the interval from the prior exam. No acute bony abnormality is seen. IMPRESSION: Increasing right-sided pleural effusion. Electronically Signed   By: Alcide Clever M.D.   On: 09/07/2016 14:22   Dg Chest Port 1 View  Result Date: 09/04/2016 CLINICAL DATA:  Short of breath EXAM: PORTABLE CHEST 1 VIEW COMPARISON:  06/20/2016 FINDINGS: Cardiac enlargement with vascular congestion. Probable early pulmonary edema Right lower lobe airspace disease most likely atelectasis versus infiltrate. Possible right effusion. No effusion on the left IMPRESSION: Congestive heart failure with mild edema Right lower lobe density may represent atelectasis or infiltrate. Possible right effusion also present. Electronically Signed   By: Marlan Palau M.D.   On: 09/04/2016 20:32    Microbiology: Recent Results (from the past 240 hour(s))  MRSA PCR Screening     Status: None   Collection Time: 09/05/16  1:27 AM  Result Value Ref Range Status   MRSA by PCR NEGATIVE NEGATIVE Final    Comment:        The GeneXpert MRSA Assay (FDA approved for NASAL specimens only), is one component of a comprehensive MRSA colonization surveillance program. It is not intended to diagnose MRSA infection nor to guide or monitor treatment for MRSA infections.  Labs: Basic Metabolic Panel:  Recent Labs Lab 09/06/16 0623 09/07/16 0635 09/08/16 0454 09/08/16 0606 09/09/16 0413 09/10/16 0421  NA 129* 128*  --  129* 129* 128*  K 3.8 3.8  --  4.0 3.8 3.7  CL 96* 95*  --  95* 92* 91*  CO2 30 30  --  28 30 30   GLUCOSE 106* 97  --  108* 100* 130*  BUN 12 10  --  14 16 20   CREATININE 0.43* 0.49*  --  0.54* 0.51* 0.92  CALCIUM 7.7* 7.5*  --  7.6* 7.5* 7.8*  MG  --   --  1.2*  --   --   --    Liver Function Tests: No results for input(s): AST, ALT, ALKPHOS, BILITOT, PROT, ALBUMIN in the last 168 hours. No results for input(s): LIPASE, AMYLASE in the last 168 hours. No results for input(s): AMMONIA in  the last 168 hours. CBC:  Recent Labs Lab 09/06/16 0623 09/09/16 0413 09/10/16 0421  WBC 6.5 5.2 9.9  NEUTROABS  --  3.1  --   HGB 9.6* 9.9* 9.9*  HCT 29.2* 30.1* 30.5*  MCV 96.1 96.2 95.9  PLT 202 238 254   Cardiac Enzymes: No results for input(s): CKTOTAL, CKMB, CKMBINDEX, TROPONINI in the last 168 hours. BNP: BNP (last 3 results) No results for input(s): BNP in the last 8760 hours.  ProBNP (last 3 results) No results for input(s): PROBNP in the last 8760 hours.  CBG:  Recent Labs Lab 09/10/16 2053 09/11/16 0111 09/11/16 0403 09/11/16 0723 09/11/16 1152  GLUCAP 87 104* 100* 91 142*       Signed:  HERNANDEZ ACOSTA,Emilio Baylock  [facility] Pager: [phone] 09/12/2016, 10:36 AM

## 2016-09-12 NOTE — Progress Notes (Signed)
[facility]

## 2016-09-23 DEATH — deceased

## 2016-09-28 ENCOUNTER — Telehealth: Payer: Self-pay | Admitting: Family Medicine

## 2016-09-28 NOTE — Telephone Encounter (Signed)
noted
